# Patient Record
Sex: Female | Born: 1960
Health system: Southern US, Community
[De-identification: ages and names within clinical notes are randomized; demographics above are authoritative.]

## PROBLEM LIST (undated history)

## (undated) DIAGNOSIS — R739 Hyperglycemia, unspecified: Secondary | ICD-10-CM

## (undated) DIAGNOSIS — I10 Essential (primary) hypertension: Secondary | ICD-10-CM

## (undated) DIAGNOSIS — E559 Vitamin D deficiency, unspecified: Secondary | ICD-10-CM

## (undated) DIAGNOSIS — D219 Benign neoplasm of connective and other soft tissue, unspecified: Secondary | ICD-10-CM

## (undated) DIAGNOSIS — F319 Bipolar disorder, unspecified: Secondary | ICD-10-CM

## (undated) DIAGNOSIS — F419 Anxiety disorder, unspecified: Secondary | ICD-10-CM

## (undated) DIAGNOSIS — M5430 Sciatica, unspecified side: Secondary | ICD-10-CM

## (undated) DIAGNOSIS — M722 Plantar fascial fibromatosis: Secondary | ICD-10-CM

## (undated) DIAGNOSIS — E538 Deficiency of other specified B group vitamins: Secondary | ICD-10-CM

## (undated) HISTORY — DX: Hyperglycemia, unspecified: R73.9

## (undated) HISTORY — DX: Benign neoplasm of connective and other soft tissue, unspecified: D21.9

## (undated) HISTORY — DX: Essential (primary) hypertension: I10

## (undated) HISTORY — DX: Anxiety disorder, unspecified: F41.9

## (undated) HISTORY — DX: Plantar fascial fibromatosis: M72.2

## (undated) HISTORY — DX: Vitamin D deficiency, unspecified: E55.9

## (undated) HISTORY — DX: Deficiency of other specified B group vitamins: E53.8

## (undated) HISTORY — DX: Sciatica, unspecified side: M54.30

## (undated) HISTORY — PX: REDUCTION MAMMAPLASTY: SUR839

## (undated) HISTORY — DX: Bipolar disorder, unspecified: F31.9

---
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
## 1984-11-06 HISTORY — PX: SP CHOLECYSTOMY: HXRAD409

## 1996-11-06 HISTORY — PX: CYSTECTOMY: SHX5119

## 2001-11-06 HISTORY — PX: GASTRIC BYPASS: SHX52

## 2008-11-06 DIAGNOSIS — F319 Bipolar disorder, unspecified: Secondary | ICD-10-CM

## 2008-11-06 HISTORY — DX: Bipolar disorder, unspecified: F31.9

## 2010-09-22 LAB — HM PAP SMEAR: HM Pap smear: NORMAL

## 2010-09-22 LAB — HM MAMMOGRAPHY: HM Mammogram: NORMAL

## 2011-05-23 ENCOUNTER — Encounter: Payer: Self-pay | Admitting: Obstetrics & Gynecology

## 2011-05-23 ENCOUNTER — Ambulatory Visit (INDEPENDENT_AMBULATORY_CARE_PROVIDER_SITE_OTHER): Payer: Medicare Other | Admitting: Obstetrics & Gynecology

## 2011-05-23 ENCOUNTER — Other Ambulatory Visit: Payer: Self-pay | Admitting: Obstetrics & Gynecology

## 2011-05-23 VITALS — BP 104/70 | HR 68 | Temp 97.3°F | Resp 16 | Ht 64.0 in | Wt 248.0 lb

## 2011-05-23 DIAGNOSIS — A499 Bacterial infection, unspecified: Secondary | ICD-10-CM

## 2011-05-23 DIAGNOSIS — N760 Acute vaginitis: Secondary | ICD-10-CM

## 2011-05-23 DIAGNOSIS — D219 Benign neoplasm of connective and other soft tissue, unspecified: Secondary | ICD-10-CM | POA: Insufficient documentation

## 2011-05-23 DIAGNOSIS — N898 Other specified noninflammatory disorders of vagina: Secondary | ICD-10-CM

## 2011-05-23 DIAGNOSIS — D259 Leiomyoma of uterus, unspecified: Secondary | ICD-10-CM

## 2011-05-23 DIAGNOSIS — B9689 Other specified bacterial agents as the cause of diseases classified elsewhere: Secondary | ICD-10-CM

## 2011-05-23 DIAGNOSIS — E669 Obesity, unspecified: Secondary | ICD-10-CM

## 2011-05-23 DIAGNOSIS — F319 Bipolar disorder, unspecified: Secondary | ICD-10-CM

## 2011-05-23 DIAGNOSIS — I10 Essential (primary) hypertension: Secondary | ICD-10-CM

## 2011-05-23 NOTE — Progress Notes (Signed)
  Subjective:    Patient ID: Julie Powell, female    DOB: September 16, 1961, 50 y.o.   MRN: [id number]  HPI Patient is a 50 year old female, who presents for evaluation of fibroids. The patient has known she's had fibroids for approximately 20 years. They've been monitored by ultrasound in [place]. She does not have pelvic pain. She has occasional bladder pressure. Her periods are heavy, but she does not think they are extraordinarily heavy. The patient is a Scientist, product/process development. She was anemic in the past, but was told she was not anemic at her last visit with her primary care provider. The patient wanted to know if she needs to do anything with her fibroid tumors. Overall, my sense is that they do not affect her quality of life at this time.   Review of Systems  Constitutional: Negative.   Cardiovascular: Negative.   Gastrointestinal: Negative.   Genitourinary: Negative for difficulty urinating.       Some urinary pressure at times  Psychiatric/Behavioral: Negative.        Objective:   Physical Exam  Constitutional: She is oriented to person, place, and time. She appears well-developed and well-nourished.  HENT:  Head: Normocephalic.  Eyes: Conjunctivae are normal.  Pulmonary/Chest: Effort normal.  Abdominal: Soft. She exhibits mass. She exhibits no distension. There is no tenderness. There is no rebound and no guarding.       Fibroids can be felt in lower abdomen  Genitourinary: Vagina normal. Uterus is enlarged. Uterus is not tender.  Musculoskeletal: Normal range of motion.  Neurological: She is alert and oriented to person, place, and time.  Skin: Skin is warm and dry.  Psychiatric: She has a normal mood and affect.          Assessment & Plan:  Assessment: Patient is a 50 year old female with enlarged fibroid uterus. Uterus is approximately 14-16 weeks size. The fibroids can be felt posteriorly and laterally on bimanual exam. Patient has no real complaints of the fibroid.  She  wants to confirm that there is no more needed intervention.  1. Obtain record releases from last transvaginal ultrasound and last CBC/anemia workup.  2.  Obtain new transvaginal ultrasound to compare, if there's been growth in the fibroids.  3.  Patient is up-to-date on Pap smear and mammogram. These are done by her primary care provider.

## 2011-05-23 NOTE — Patient Instructions (Signed)
Will call you if wet prep is abnml.  Sign record releases today.

## 2011-05-24 LAB — WET PREP, GENITAL
Trich, Wet Prep: NONE SEEN
Yeast Wet Prep HPF POC: NONE SEEN

## 2011-05-25 ENCOUNTER — Telehealth: Payer: Self-pay | Admitting: *Deleted

## 2011-05-25 ENCOUNTER — Ambulatory Visit
Admission: RE | Admit: 2011-05-25 | Discharge: 2011-05-25 | Disposition: A | Discharge status: Home / Self Care | Payer: Medicare Other | Source: Ambulatory Visit | Attending: Obstetrics & Gynecology | Admitting: Obstetrics & Gynecology

## 2011-05-25 ENCOUNTER — Other Ambulatory Visit: Payer: Self-pay | Admitting: Obstetrics & Gynecology

## 2011-05-25 ENCOUNTER — Other Ambulatory Visit: Payer: Medicare Other

## 2011-05-25 DIAGNOSIS — R1909 Other intra-abdominal and pelvic swelling, mass and lump: Secondary | ICD-10-CM

## 2011-05-25 DIAGNOSIS — R978 Other abnormal tumor markers: Secondary | ICD-10-CM

## 2011-05-25 DIAGNOSIS — N898 Other specified noninflammatory disorders of vagina: Secondary | ICD-10-CM

## 2011-05-25 MED ORDER — METRONIDAZOLE 250 MG PO TABS: 500.0000 mg | ORAL_TABLET | Freq: Two times a day (BID) | ORAL | Status: AC

## 2011-05-25 NOTE — Telephone Encounter (Signed)
Message copied by Granville Lewis on Thu May 25, 2011 11:40 AM ------      Message from: Lesly Dukes      Created: Thu May 25, 2011  9:12 AM       Pt has had symptoms of vaginal discharge.  I ordered flagyl e script.  Please call her.

## 2011-05-25 NOTE — Telephone Encounter (Signed)
Pt notified of CT results.  Pt is scheduled for MRI of pelvis to further evaluate the RT adnexa.  Ca-125 drawn today, per Dr.Leggett.

## 2011-05-25 NOTE — Progress Notes (Signed)
Addended by: Lesly Dukes on: 05/25/2011 09:15 AM   Modules accepted: Orders

## 2011-05-25 NOTE — Telephone Encounter (Signed)
CA-125 drawn

## 2011-05-26 ENCOUNTER — Other Ambulatory Visit: Payer: Self-pay | Admitting: Obstetrics & Gynecology

## 2011-05-26 DIAGNOSIS — D259 Leiomyoma of uterus, unspecified: Secondary | ICD-10-CM

## 2011-05-26 LAB — CREATININE, SERUM: Creat: 1.04 mg/dL (ref 0.50–1.10)

## 2011-05-26 LAB — BUN: BUN: 19 mg/dL (ref 6–23)

## 2011-05-27 LAB — CA 125: CA 125: 10.1 U/mL (ref 0.0–30.2)

## 2011-05-28 ENCOUNTER — Ambulatory Visit
Admission: RE | Admit: 2011-05-28 | Discharge: 2011-05-28 | Disposition: A | Discharge status: Home / Self Care | Payer: Medicare Other | Source: Ambulatory Visit | Attending: Obstetrics & Gynecology | Admitting: Obstetrics & Gynecology

## 2011-05-28 DIAGNOSIS — D259 Leiomyoma of uterus, unspecified: Secondary | ICD-10-CM

## 2011-05-28 MED ORDER — GADOBENATE DIMEGLUMINE 529 MG/ML IV SOLN
20.0000 mL | Freq: Once | INTRAVENOUS | Status: AC | PRN
  Administered 2011-05-28: 20 mL via INTRAVENOUS

## 2011-06-14 ENCOUNTER — Ambulatory Visit: Payer: Medicare Other | Admitting: Obstetrics & Gynecology

## 2011-06-20 ENCOUNTER — Encounter: Payer: Self-pay | Admitting: Obstetrics & Gynecology

## 2011-06-20 ENCOUNTER — Ambulatory Visit (INDEPENDENT_AMBULATORY_CARE_PROVIDER_SITE_OTHER): Payer: Medicare Other | Admitting: Obstetrics & Gynecology

## 2011-06-20 VITALS — BP 109/72 | HR 94 | Temp 98.0°F | Resp 16 | Ht 64.0 in | Wt 250.0 lb

## 2011-06-20 DIAGNOSIS — D259 Leiomyoma of uterus, unspecified: Secondary | ICD-10-CM

## 2011-06-20 NOTE — Progress Notes (Signed)
  Subjective:    Patient ID: Julie Powell, female    DOB: 02/01/1961, 50 y.o.   MRN: [id number]  HPI Patient is a 50 year old female, who is here for followup. The patient is known to have a large fibroid uterus.  Patient had ultrasound and MRI, which confirmed multiple fibroids throughout the uterus. The largest fibroid is approximately 7 cm. The ultrasound there was a questionable right adnexal mass, but the ovaries looked normal. on MRI.  The patient's CA 125 was normal. The patient has normal menses. She occasionally skips a menses. She is having hot flashes and flushes so I believe she is somewhat nearing menopause. She was anemic in the past but this has resolved.  Patient would like to not have surgery to possible we reviewed the remote chance to be a leiomyosarcoma, however, the patient has had large fibroids for many years. The patient is a Scientist, product/process development. If surgery was desired we would give the patient 6 month of Lupron and erythropoietin 3 weeks before the surgery.     Review of Systems  Constitutional: Positive for activity change and fatigue.  Cardiovascular: Negative.   Gastrointestinal: Negative.   Genitourinary: Negative.   Psychiatric/Behavioral: Positive for dysphoric mood.       Objective:   Physical Exam  Constitutional: She is oriented to person, place, and time. She appears well-developed and well-nourished. No distress.  HENT:  Head: Normocephalic and atraumatic.  Eyes: Conjunctivae are normal.  Pulmonary/Chest: Effort normal.  Neurological: She is alert and oriented to person, place, and time.  Skin: Skin is warm and dry.  Psychiatric:       Appears more quieter and down.  Pt denies suicidal ideation or desire to hurt others.          Assessment & Plan:  Assessment 50 year old female with multiple fibroids that are not causing any symptoms. 1. Repeat ultrasound in one year. Discussed signs and symptoms of enlarging uterus.  Pt should see [place] if these  occur. 2.  Patient gets healthcare maintenance her primary care provider in needs to continue with this. 3. Patient needs a provider to treat her depression. The patient is not suicidal. Patient become suicidal or half of the death and she should go to the emergency room immediately.

## 2011-06-20 NOTE — Patient Instructions (Signed)
Fibroids (Leiomyoma's)   You have been diagnosed as having a fibroid. Fibroids are smooth muscle lumps (tumors) which can occur any place in a woman's body. They are usually in the womb (uterus). The most common problem (symptom) of fibroids is bleeding. Over time this may cause low red blood cells (anemia). Other symptoms include feelings of pressure and pain in the pelvis. The diagnosis (learning what is wrong) of fibroids is made by physical exam. Sometimes tests such as an ultrasound are used. This is helpful when fibroids are felt around the ovaries and to look for tumors.   TREATMENT   Most fibroids do not need surgical or medical treatment. Sometimes a tissue sample (biopsy) of the lining of the uterus is done to rule out cancer. If there is no cancer and only a small amount of bleeding, the problem can be watched.   Hormonal treatment can improve the problem.   When surgery is needed, it can consist of removing the fibroid. Vaginal birth may not be possible after the removal of fibroids. This depends on where they are and the extent of surgery. When pregnancy occurs with fibroids it is usually normal.   Your caregiver can help decide which treatments are best for you.   HOME CARE INSTRUCTIONS   Do not use aspirin as this may increase bleeding problems.   If your periods (menses) are heavy, record the number of pads or tampons used per month. Bring this information to your caregiver. This can help them determine the best treatment for you.   SEEK IMMEDIATE MEDICAL ATTENTION IF:   You have pelvic pain or cramps not controlled with medications, or experience a sudden increase in pain.   You have an increase of pelvic bleeding between and during menses.   You feel lightheaded or have fainting spells.   You develop worsening belly (abdominal) pain.   Document Released: 10/20/2000 Document Re-Released: 01/30/2008   ExitCare® Patient Information ©2011 [employer].

## 2011-07-04 ENCOUNTER — Ambulatory Visit (INDEPENDENT_AMBULATORY_CARE_PROVIDER_SITE_OTHER): Payer: Medicare Other | Admitting: Licensed Clinical Social Worker

## 2011-07-04 DIAGNOSIS — F331 Major depressive disorder, recurrent, moderate: Secondary | ICD-10-CM

## 2011-07-14 ENCOUNTER — Ambulatory Visit (INDEPENDENT_AMBULATORY_CARE_PROVIDER_SITE_OTHER): Payer: Medicare Other | Admitting: Physician Assistant

## 2011-07-14 DIAGNOSIS — F339 Major depressive disorder, recurrent, unspecified: Secondary | ICD-10-CM

## 2011-07-19 ENCOUNTER — Encounter (INDEPENDENT_AMBULATORY_CARE_PROVIDER_SITE_OTHER): Payer: Medicare Other | Admitting: Licensed Clinical Social Worker

## 2011-07-19 DIAGNOSIS — F331 Major depressive disorder, recurrent, moderate: Secondary | ICD-10-CM

## 2011-07-24 ENCOUNTER — Encounter (HOSPITAL_COMMUNITY): Payer: Medicare Other | Admitting: Physician Assistant

## 2011-07-24 ENCOUNTER — Encounter (INDEPENDENT_AMBULATORY_CARE_PROVIDER_SITE_OTHER): Payer: Medicare Other | Admitting: Psychiatry

## 2011-07-24 DIAGNOSIS — F3132 Bipolar disorder, current episode depressed, moderate: Secondary | ICD-10-CM

## 2011-07-24 DIAGNOSIS — F4010 Social phobia, unspecified: Secondary | ICD-10-CM

## 2011-07-27 ENCOUNTER — Encounter (INDEPENDENT_AMBULATORY_CARE_PROVIDER_SITE_OTHER): Payer: Medicare Other | Admitting: Licensed Clinical Social Worker

## 2011-07-27 DIAGNOSIS — F329 Major depressive disorder, single episode, unspecified: Secondary | ICD-10-CM

## 2011-08-09 ENCOUNTER — Encounter (INDEPENDENT_AMBULATORY_CARE_PROVIDER_SITE_OTHER): Payer: BC Managed Care – PPO | Admitting: Licensed Clinical Social Worker

## 2011-08-09 DIAGNOSIS — F331 Major depressive disorder, recurrent, moderate: Secondary | ICD-10-CM

## 2011-08-18 ENCOUNTER — Encounter (INDEPENDENT_AMBULATORY_CARE_PROVIDER_SITE_OTHER): Payer: Medicare Other | Admitting: Licensed Clinical Social Worker

## 2011-08-18 DIAGNOSIS — F331 Major depressive disorder, recurrent, moderate: Secondary | ICD-10-CM

## 2011-09-06 ENCOUNTER — Encounter (INDEPENDENT_AMBULATORY_CARE_PROVIDER_SITE_OTHER): Payer: BC Managed Care – PPO | Admitting: Licensed Clinical Social Worker

## 2011-09-06 DIAGNOSIS — F331 Major depressive disorder, recurrent, moderate: Secondary | ICD-10-CM

## 2011-09-08 ENCOUNTER — Encounter (HOSPITAL_COMMUNITY): Payer: Self-pay | Admitting: Psychiatry

## 2011-09-13 ENCOUNTER — Encounter (HOSPITAL_COMMUNITY): Payer: Self-pay | Admitting: Psychology

## 2011-09-22 ENCOUNTER — Encounter (HOSPITAL_COMMUNITY): Payer: Medicare Other | Admitting: Psychiatry

## 2011-09-26 ENCOUNTER — Encounter (HOSPITAL_COMMUNITY): Payer: Medicare Other | Admitting: Licensed Clinical Social Worker

## 2011-10-09 ENCOUNTER — Encounter: Payer: Self-pay | Admitting: Obstetrics & Gynecology

## 2011-10-11 ENCOUNTER — Encounter (HOSPITAL_COMMUNITY): Payer: Self-pay | Admitting: Psychiatry

## 2011-10-11 ENCOUNTER — Ambulatory Visit (INDEPENDENT_AMBULATORY_CARE_PROVIDER_SITE_OTHER): Payer: BC Managed Care – PPO | Admitting: Psychiatry

## 2011-10-11 DIAGNOSIS — F419 Anxiety disorder, unspecified: Secondary | ICD-10-CM

## 2011-10-11 DIAGNOSIS — F332 Major depressive disorder, recurrent severe without psychotic features: Secondary | ICD-10-CM

## 2011-10-11 DIAGNOSIS — F411 Generalized anxiety disorder: Secondary | ICD-10-CM

## 2011-10-11 MED ORDER — LITHIUM CARBONATE ER 300 MG PO TBCR: EXTENDED_RELEASE_TABLET | ORAL | Status: DC

## 2011-10-11 MED ORDER — CLONAZEPAM 0.5 MG PO TABS: 0.5000 mg | ORAL_TABLET | Freq: Two times a day (BID) | ORAL | Status: DC

## 2011-10-11 MED ORDER — PAROXETINE HCL 30 MG PO TABS: 30.0000 mg | ORAL_TABLET | ORAL | Status: DC

## 2011-10-11 MED ORDER — BUPROPION HCL ER (SR) 100 MG PO TB12: ORAL_TABLET | ORAL | Status: DC

## 2011-10-11 NOTE — Patient Instructions (Signed)
Take medications as provided by hospital and written today and use balm for dry lips.  Next visit Lithium level will be taken.  Recall  It is important to report any suicidal thoughts to call office  or call the Crisis numbers you have been given. Keep appt with JB  Return in 1 month

## 2011-10-11 NOTE — Progress Notes (Signed)
   [facility] Follow-up Outpatient Visit  Antia Rahal [birth year]  Date: 10/11/11   Subjective: PT BECAME VERY DEPRESSED WITH SUICIDAL IDEATION.  SHE WAS ADM[facility] 07, 2011 and was discharged Nov.7, 2012 and IOP nov. 8 2012 group counslling.  todaThere were no vitals filed for this visit.  Mental Status Examination  Appearance: casual   Alert: Yes Attention: good  Cooperative: Yes Eye Contact: Good Speech: normal Psychomotor Activity: Normal Memory/Concentration: fair Oriented: person, place, time/date, situation and stated date of December 5/12 Mood: Worthlessnot doing things she could should do Affect: Congruent Thought Processes and Associations: Futures trader of Knowledge: Fair Thought Content: Normal thought process Insight: Fair Judgement: Fair  Diagnosis: Bipolar I depression most recent in early partial remission  Treatment Plan: mediocation management, therpy  Mickeal Skinner, MD

## 2011-10-18 ENCOUNTER — Ambulatory Visit (HOSPITAL_COMMUNITY): Payer: BC Managed Care – PPO | Admitting: Licensed Clinical Social Worker

## 2011-11-07 DIAGNOSIS — M722 Plantar fascial fibromatosis: Secondary | ICD-10-CM

## 2011-11-07 HISTORY — DX: Plantar fascial fibromatosis: M72.2

## 2011-11-13 ENCOUNTER — Encounter (HOSPITAL_COMMUNITY): Payer: Self-pay | Admitting: Psychiatry

## 2011-11-13 ENCOUNTER — Ambulatory Visit (INDEPENDENT_AMBULATORY_CARE_PROVIDER_SITE_OTHER): Payer: Medicare Other | Admitting: Psychiatry

## 2011-11-13 VITALS — BP 110/84 | HR 81 | Ht 64.0 in | Wt 258.0 lb

## 2011-11-13 DIAGNOSIS — E119 Type 2 diabetes mellitus without complications: Secondary | ICD-10-CM

## 2011-11-13 DIAGNOSIS — F332 Major depressive disorder, recurrent severe without psychotic features: Secondary | ICD-10-CM | POA: Diagnosis not present

## 2011-11-13 DIAGNOSIS — F411 Generalized anxiety disorder: Secondary | ICD-10-CM | POA: Diagnosis not present

## 2011-11-13 DIAGNOSIS — F419 Anxiety disorder, unspecified: Secondary | ICD-10-CM

## 2011-11-13 MED ORDER — LITHIUM CARBONATE ER 300 MG PO TBCR: EXTENDED_RELEASE_TABLET | ORAL | Status: DC

## 2011-11-13 MED ORDER — BUPROPION HCL ER (SR) 100 MG PO TB12: ORAL_TABLET | ORAL | Status: DC

## 2011-11-13 MED ORDER — PAROXETINE HCL 30 MG PO TABS: 30.0000 mg | ORAL_TABLET | ORAL | Status: DC

## 2011-11-13 MED ORDER — CLONAZEPAM 0.5 MG PO TABS: 0.5000 mg | ORAL_TABLET | Freq: Two times a day (BID) | ORAL | Status: DC

## 2011-11-13 NOTE — Patient Instructions (Signed)
He has been given your medications would refill and remember you are to prepare for a fasting blood test any morning next week through Friday if you miss "next week you will I have your lab tests canceled. Continue with your therapy with Merlene Morse and a return in one month to if it to review your lab test.  Remember if you have any suicidal thoughts or extreme stress you may call 911 or at the behavioral [facility] in [place] [phone].

## 2011-11-13 NOTE — Progress Notes (Signed)
Patient ID: Julie Powell, female   DOB: 06/03/1961, 51 y.o.   MRN: [id number] Julie Powell has been having some attachment stress. She is trying to give her 16 year old son and some freedom and some connection with father and stepmother. However, she loaned him her car and gave him a period of time to visit family and he decided not to return on the expected date. She is very distressed that he has disrespected her and he stepmother has not insisted that he follow mother's instructions. Stepmother left a message and said that he will be returning today. She feels very isolated and has decided she has to create a family of her own. She is hoping that her son will complete high school graduate and attended college as planned. She is uncertain of his motivation.  She states that she has had bariatric surgery in the past and had lost a significant amount of weight. She weighed over 300 pounds and was able to reduce her weight to 180. She says she does little activity at all and stays home, gets up dresses and eats all day long. When asked why she does this she admits it is comfort food. She really would like to lose weight at this time and she is encouraged to consider this. There are programs for her to explore and she needs to have a structured plan so she can stop eating constantly. It affects her self-esteem and she knows that she felt so much better when she wasn't so obviously overweight. She denies suicidal plans but has had passive suicidal thoughts in the past. She is reminded to always report these moods and seek help. She continues with personal therapy with Merlene Morse.

## 2011-11-16 ENCOUNTER — Ambulatory Visit (HOSPITAL_COMMUNITY): Payer: Self-pay | Admitting: Licensed Clinical Social Worker

## 2011-11-17 ENCOUNTER — Encounter (HOSPITAL_COMMUNITY): Payer: Self-pay | Admitting: Licensed Clinical Social Worker

## 2011-11-17 ENCOUNTER — Ambulatory Visit (INDEPENDENT_AMBULATORY_CARE_PROVIDER_SITE_OTHER): Payer: BC Managed Care – PPO | Admitting: Licensed Clinical Social Worker

## 2011-11-17 DIAGNOSIS — F319 Bipolar disorder, unspecified: Secondary | ICD-10-CM

## 2011-11-17 NOTE — Progress Notes (Signed)
   THERAPIST PROGRESS NOTE  Session Time: 9:00 - 9:55 AM  Participation Level: Active  Behavioral Response: Well GroomedAlertEuthymic and Irritable  Type of Therapy: Individual Therapy  Treatment Goals addressed: Anger and Coping  Interventions: Strength-based, Family Systems and Reframing  Summary: Julie Powell is a 51 y.o. female who presents with a pleasant mood which changed to irritation when she talked about certain issues.  She was upset with her son who went to visit his step family over Christmas and he extended his stay for over another week without permission and he had her car - she was renting a car.  He came back saying he loved them as much as her which felt like a narcissistic injury to her.  Discussed not taking his comments like that so seriously for he had a need to distance with her at his age and that may be the way he is doing it.  The incident did make her aware that she has put him first and now she must put herself first.  She has been using [email or web address] which has not worked out so far.  In fact she wants to consider relationships with women because she is fed up with men.  That may or may not work for her.  She knows a lot of her problems stem from her weight issue - it is a way of hiding and she does not feel good about herself at the weight she is at.  She is resistant to working on her weight issue - food is definitely an emotional fix.  There is a friend that is in [place] and she has meant to go visit her for a long time - it is time to plan a trip.  She is not sure what goals to work on in therapy.  Her assignment for next week is to define some issues that she would be willing to work on   Suicidal/Homicidal: Nowithout intent/plan  Therapist Response: She commented about how good it was to want to live  Plan: Return again in 1 weeks.  Diagnosis: Axis I: Bipolar, Depressed    Axis II: Deferred    Julie Powell,JUDITH A, LCSW 11/17/2011

## 2011-11-17 NOTE — Patient Instructions (Signed)
Work on [employer] Figure out problem want to deal with counseling. Not personalize son's reaction Continue to set appropriate limits for son Make plans to go to [facility]

## 2011-11-20 ENCOUNTER — Other Ambulatory Visit (HOSPITAL_COMMUNITY): Payer: Self-pay | Admitting: Psychiatry

## 2011-11-20 DIAGNOSIS — F332 Major depressive disorder, recurrent severe without psychotic features: Secondary | ICD-10-CM | POA: Diagnosis not present

## 2011-11-20 DIAGNOSIS — E119 Type 2 diabetes mellitus without complications: Secondary | ICD-10-CM | POA: Diagnosis not present

## 2011-11-21 LAB — CBC WITH DIFFERENTIAL/PLATELET
Basophils Absolute: 0 10*3/uL (ref 0.0–0.1)
Basophils Relative: 1 % (ref 0–1)
Eosinophils Absolute: 0.2 10*3/uL (ref 0.0–0.7)
Eosinophils Relative: 4 % (ref 0–5)
HCT: 39.5 % (ref 36.0–46.0)
Hemoglobin: 12.7 g/dL (ref 12.0–15.0)
Lymphocytes Relative: 31 % (ref 12–46)
Lymphs Abs: 1.3 10*3/uL (ref 0.7–4.0)
MCH: 30.5 pg (ref 26.0–34.0)
MCHC: 32.2 g/dL (ref 30.0–36.0)
MCV: 95 fL (ref 78.0–100.0)
Monocytes Absolute: 0.3 10*3/uL (ref 0.1–1.0)
Monocytes Relative: 7 % (ref 3–12)
Neutro Abs: 2.4 10*3/uL (ref 1.7–7.7)
Neutrophils Relative %: 57 % (ref 43–77)
Platelets: 242 10*3/uL (ref 150–400)
RBC: 4.16 MIL/uL (ref 3.87–5.11)
RDW: 14 % (ref 11.5–15.5)
WBC: 4.1 10*3/uL (ref 4.0–10.5)

## 2011-11-21 LAB — COMPLETE METABOLIC PANEL WITH GFR
ALT: 20 U/L (ref 0–35)
AST: 21 U/L (ref 0–37)
Albumin: 4.3 g/dL (ref 3.5–5.2)
Alkaline Phosphatase: 89 U/L (ref 39–117)
BUN: 19 mg/dL (ref 6–23)
CO2: 28 mEq/L (ref 19–32)
Calcium: 9.4 mg/dL (ref 8.4–10.5)
Chloride: 104 mEq/L (ref 96–112)
Creat: 1 mg/dL (ref 0.50–1.10)
GFR, Est African American: 77 mL/min
GFR, Est Non African American: 67 mL/min
Glucose, Bld: 89 mg/dL (ref 70–99)
Potassium: 4.7 mEq/L (ref 3.5–5.3)
Sodium: 140 mEq/L (ref 135–145)
Total Bilirubin: 0.4 mg/dL (ref 0.3–1.2)
Total Protein: 6.8 g/dL (ref 6.0–8.3)

## 2011-11-21 LAB — LIPID PANEL
Cholesterol: 209 mg/dL — ABNORMAL HIGH (ref 0–200)
HDL: 90 mg/dL (ref >39)
LDL Cholesterol: 110 mg/dL — ABNORMAL HIGH (ref 0–99)
Total CHOL/HDL Ratio: 2.3 Ratio
Triglycerides: 47 mg/dL (ref <150)
VLDL: 9 mg/dL (ref 0–40)

## 2011-11-21 LAB — HEMOGLOBIN A1C
Hgb A1c MFr Bld: 5.7 % — ABNORMAL HIGH (ref <5.7)
Mean Plasma Glucose: 117 mg/dL — ABNORMAL HIGH (ref <117)

## 2011-11-21 LAB — LITHIUM LEVEL: Lithium Lvl: 0.56 mEq/L — ABNORMAL LOW (ref 0.80–1.40)

## 2011-11-24 ENCOUNTER — Ambulatory Visit (INDEPENDENT_AMBULATORY_CARE_PROVIDER_SITE_OTHER): Payer: Medicare Other | Admitting: Licensed Clinical Social Worker

## 2011-11-24 ENCOUNTER — Encounter (HOSPITAL_COMMUNITY): Payer: Self-pay | Admitting: Licensed Clinical Social Worker

## 2011-11-24 DIAGNOSIS — F3132 Bipolar disorder, current episode depressed, moderate: Secondary | ICD-10-CM

## 2011-11-24 NOTE — Patient Instructions (Signed)
Julie Powell will make a list of each family member and how they have hurt her and left her feeling unloved Plan to explore in depth and help her see there may be another way to look at what has happened in the past Assist her in giving up old perceptions and start building new ones

## 2011-11-24 NOTE — Progress Notes (Signed)
   THERAPIST PROGRESS NOTE  Session Time: 12:00 - !2:50 AM  Participation Level: Active  Behavioral Response: Well GroomedAlertDepressed  Type of Therapy: Individual Therapy  Treatment Goals addressed: Anger and Coping  Interventions: Strength-based, Supportive and Family Systems  Summary: Julie Powell is a 51 y.o. female who presents with a bit of a defeatist attitude when we discuss her weight or meeting people.  She does not have any interest in losing weight even though she knows she needs to.  She talked more about difficulty being around people and her fear of getting hurt or abandoned.  She said she does not feel like a victim for she gets angry about it and then puts her wall up.  Discussed more about her son and how she interprets his behavior.  She sees it that he does not love her when he did not come home when she asked.  Discussed teenagers and how they think - it had nothing to do with her it was his satisfying his own pleasures.  If she continued to think he did not love her it might have ended their relationship.  Discussed the difficulties of letting go when they are ready for next step in life.  After our discussion it seems like she needs to do some deep work with her past hurts in her family - so she is to bring a list of people and how they hurt her.  We will discuss with the goal of reframing, forgiveness, and moving on.  She admitted that she mainly wanted a female companion and she has tried different ways and nothing has been successful.  Discussed different work scenarios and she is clear that she cannot go back SW - her evaluations were always good but she felt she did not like the work and she could not give it all that it needed.  Suicidal/Homicidal: Nowithout intent/plan  Therapist Response: Still has some ideation but has no plan and the thoughts are fleeting.  Reviewed safety again  Plan: Return again in 1 weeks.  Diagnosis: Axis I: Bipolar, Depressed    Axis  II: Deferred    Maleah Rabago,JUDITH A, LCSW 11/24/2011

## 2011-11-29 ENCOUNTER — Ambulatory Visit (INDEPENDENT_AMBULATORY_CARE_PROVIDER_SITE_OTHER): Payer: Medicare Other | Admitting: Licensed Clinical Social Worker

## 2011-11-29 DIAGNOSIS — F3130 Bipolar disorder, current episode depressed, mild or moderate severity, unspecified: Secondary | ICD-10-CM | POA: Diagnosis not present

## 2011-11-30 ENCOUNTER — Encounter (HOSPITAL_COMMUNITY): Payer: Self-pay | Admitting: Licensed Clinical Social Worker

## 2011-11-30 NOTE — Progress Notes (Signed)
   THERAPIST PROGRESS NOTE  Session Time: 10:05 - 1100 AM  Participation Level: Active  Behavioral Response: Well GroomedAlertDepressed  Type of Therapy: Individual Therapy  Treatment Goals addressed: Coping  Interventions: Solution Focused and Supportive  Summary: Julie Powell is a 51 y.o. female who presents with bipolar disorder,  Today she was euthymic, depressed and somewhat hopeless.  She is aware that her weight is a problem that gets in her way for finding companionship.  Most men are not interested in a heavy woman.  She reports that her day consists of sleeping, tv watching and eating.  Her TV shows stop her from making plans because she does not want to miss some show.  She likes the [employer] a lot.  She knows that she allows that to control her life.  She lives through these TV lives.  She is not uncomfortable about going to a movie or eating out by herself.  Her son is doing what he is supposed to do to get ready for college - he is going into Geologist, engineering school.  Discussed how she did not need to take her son's behavior personally - like when he stayed with his step family instead of coming home.  For one thing kids at his age are self centered and are focussed on their own good time and not concerned about what you want. [name] had started pulling away because she was personalizing his behavior.  After our discussion, she felt better about her son.  She listed things that she could do.  Take her laptop to [place], go to a movie, dinner theatre, travel to [place], [place], [place], theatre play, and clubs for the social scene.  She will work on her list and perhaps at least do one thing this week..   Suicidal/Homicidal: Nowithout intent/plan  Plan: Return again in 1 weeks.  Diagnosis: Axis I: Bipolar, Depressed    Axis II: Deferred    [name],JUDITH A, LCSW 11/30/2011

## 2011-11-30 NOTE — Patient Instructions (Signed)
Make a list of all places would like to go Look over [place] and see what would interest her for day trips

## 2011-12-08 ENCOUNTER — Encounter (HOSPITAL_COMMUNITY): Payer: Self-pay | Admitting: Licensed Clinical Social Worker

## 2011-12-08 ENCOUNTER — Ambulatory Visit (INDEPENDENT_AMBULATORY_CARE_PROVIDER_SITE_OTHER): Payer: Medicare Other | Admitting: Licensed Clinical Social Worker

## 2011-12-08 DIAGNOSIS — F319 Bipolar disorder, unspecified: Secondary | ICD-10-CM

## 2011-12-08 NOTE — Progress Notes (Signed)
   THERAPIST PROGRESS NOTE  Session Time: - 10:05 - 11:00  AM  Participation Level: Active  Behavioral Response: Well GroomedAlertEuthymic  Type of Therapy: Individual Therapy  Treatment Goals addressed: Anxiety and Coping  Interventions: Motivational Interviewing, Strength-based, Supportive and Reframing  Summary: Glynnis Gavel is a 51 y.o. female who presents with anxiety and depression.  Today Machele reports that she and her son went to [place] and did some touring - it was an enjoyable trip - it was something her son had been wanting to do.   .She also took a walk for exercise around the [place].  Discussed her problem with eating compulsively - she is eating too much food - eats until it hurts.  Discussed the purpose of the behavior and she agreed that it had to do with self soothing and pushing feelings down  Talked about eating more often the amount would be equivalent to one meal.  Set up menu for 3 meals and snacks - then spread out.  Try some substitute behaviors.  Watch a movie, write in a journal, call someone she can talk to, or take a walk. This will be difficult for her but she would give it a try.  Discussed how she does not have friends down here.  She goes to [employer] meetings.  She has not been going and she could not deny that the people are friendly and understanding - not judgmental - so she really could not use her usual excuses.  She has to learn that it is ok to take what she needs which would be going to a meeting and not to choose other activities that she does not feel up to right now.  She is going to make some contacts and she is continuing to look into finding a club where there is dancing for an older group of people.  Suicidal/Homicidal: Nowithout intent/plan  Plan: Return again in 1 weeks.  Diagnosis: Axis I: Bipolar, Depressed    Axis II: Deferred    Bradshaw Minihan,JUDITH A, LCSW 12/08/2011

## 2011-12-14 ENCOUNTER — Ambulatory Visit (INDEPENDENT_AMBULATORY_CARE_PROVIDER_SITE_OTHER): Payer: Medicare Other | Admitting: Psychiatry

## 2011-12-14 ENCOUNTER — Encounter (HOSPITAL_COMMUNITY): Payer: Self-pay | Admitting: Psychiatry

## 2011-12-14 VITALS — BP 110/72 | HR 82 | Ht 64.0 in | Wt 262.0 lb

## 2011-12-14 DIAGNOSIS — F3130 Bipolar disorder, current episode depressed, mild or moderate severity, unspecified: Secondary | ICD-10-CM | POA: Diagnosis not present

## 2011-12-14 DIAGNOSIS — F411 Generalized anxiety disorder: Secondary | ICD-10-CM | POA: Diagnosis not present

## 2011-12-14 DIAGNOSIS — F419 Anxiety disorder, unspecified: Secondary | ICD-10-CM

## 2011-12-14 DIAGNOSIS — F332 Major depressive disorder, recurrent severe without psychotic features: Secondary | ICD-10-CM

## 2011-12-14 MED ORDER — CLONAZEPAM 0.5 MG PO TABS: 0.5000 mg | ORAL_TABLET | Freq: Two times a day (BID) | ORAL | Status: DC

## 2011-12-14 MED ORDER — LITHIUM CARBONATE ER 300 MG PO TBCR: EXTENDED_RELEASE_TABLET | ORAL | Status: DC

## 2011-12-14 MED ORDER — PAROXETINE HCL 30 MG PO TABS: 30.0000 mg | ORAL_TABLET | ORAL | Status: DC

## 2011-12-14 NOTE — Patient Instructions (Signed)
You have been giving her medications for 3 months supply and the Clonopin may be called in by the pharmacist and if three-month supply can be provided all the pharmacists has to do with indicate that on the request for the next prescription refill. Remember if you are having any extreme distress suicidal/homicidal thoughts please call 911, go to the nearest emergency room or call behavioral health center [phone].

## 2011-12-14 NOTE — Progress Notes (Signed)
Patient ID: Julie Powell, female   DOB: 1961/08/08, 51 y.o.   MRN: [id number] [name] is states that she is disappointed that she has been eating gradually more and more a quantity that has defeated the purpose of having a bypass surgery. She wishes to have another one. She states that she is depressed with a mood of 7/10 worst. She doesn't feel motivated to do anything. Her therapist is encouraging her to choose one activity she will do outdoors. She denies any suicidal/homicidal thoughts. She believes that the medication has been helpful despite her low mood.

## 2011-12-19 ENCOUNTER — Ambulatory Visit (INDEPENDENT_AMBULATORY_CARE_PROVIDER_SITE_OTHER): Payer: Medicare Other | Admitting: Licensed Clinical Social Worker

## 2011-12-19 ENCOUNTER — Encounter (HOSPITAL_COMMUNITY): Payer: Self-pay | Admitting: Licensed Clinical Social Worker

## 2011-12-19 DIAGNOSIS — F319 Bipolar disorder, unspecified: Secondary | ICD-10-CM

## 2011-12-19 NOTE — Progress Notes (Signed)
   THERAPIST PROGRESS NOTE  Session Time: 10: 10 - 11:00  Participation Level: Active  Behavioral Response: Well GroomedAlertEuphoric  Type of Therapy: Individual Therapy  Treatment Goals addressed: Anxiety and Coping  Interventions: Motivational Interviewing, Strength-based, Supportive and Reframing  Summary: Julie Powell is a 51 y.o. female who presents with problems with isolation, weight and anxiety.  Today Julie Powell was in a pleasant mood - she reported that she went for a walk with her son at the mall - their purpose was for exercise.  Looked at things but did not shop.  She is still looking for house cleaning job - mainly at a hotel or assisted living place.  Anywhere she does not have to be exposed to too many people.  Discussed when she started to feel like not being around people - she identified when husband and she were working in the same agency and he had been having affaires and every one else know except her.  She felt like hiding - she took his behavior as shame to herself.  The step mother that her son has is one of those women. He did marry her and is now separated from her and living with someone else.  This woman had some sons and she had a child with his father so he likes to go up there and get together with his brothers.  Julie Powell seems to take that personally and feels she is forgotten when he goes there.  She stated that his father and she have maintained a good relationship throughout the separation and divorce.  She also reported that she does not feel like some of the medications she is on is either helping and they are adding to her problem with weight.  She has started to wean herself off of Paxil, Lithium and Klonopin.  She knows to do it slowly and give time in between .  However, I strongly emphasized that she needs to work with her doctor in this process.  She is staying on Wellbutrin.   She is starting with our new doctor who is starting in March because Dr. Ferol Luz is  leaving.  She is talking about wanting to lose weight - strongly recommended weight watchers program.  She said she could work our a plan to follow and we would discuss next time.  Am encouraging her to make this one of her goals for she is also hiding herself with her weight and eating away her feelings.  Suicidal/Homicidal: Nowithout intent/plan  Therapist Response: Seems to be somewhat more active in planning her life into future - no suicidal ideation  Plan: Return again in 2 weeks.  Diagnosis: Axis I: Bipolar, Depressed    Axis II: Deferred    Sherina Stammer,JUDITH A, LCSW 12/19/2011

## 2011-12-19 NOTE — Patient Instructions (Signed)
Be in touch with Dr. Ferol Luz if have problems with lowering medications Discussed medications thoroughly with new doctor Continue to do exercise away from house - at mall etc Set up weight loss program - look into weight watchers

## 2012-01-02 ENCOUNTER — Encounter (HOSPITAL_COMMUNITY): Payer: Self-pay | Admitting: Licensed Clinical Social Worker

## 2012-01-02 ENCOUNTER — Ambulatory Visit (INDEPENDENT_AMBULATORY_CARE_PROVIDER_SITE_OTHER): Payer: Medicare Other | Admitting: Licensed Clinical Social Worker

## 2012-01-02 DIAGNOSIS — F319 Bipolar disorder, unspecified: Secondary | ICD-10-CM

## 2012-01-02 NOTE — Patient Instructions (Signed)
Continue to review foods willing to change over to Revamp recipes on food she likes to make less fattening Start to do more cooking at home Do not cut all foods out that she likes even if fattening - just eat less Continue to add more choices of foods in meals instead of only eating a lot of one thing.
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
## 2012-01-04 ENCOUNTER — Encounter (HOSPITAL_COMMUNITY): Payer: Self-pay | Admitting: Licensed Clinical Social Worker

## 2012-01-16 ENCOUNTER — Ambulatory Visit (INDEPENDENT_AMBULATORY_CARE_PROVIDER_SITE_OTHER): Payer: Medicare Other | Admitting: Licensed Clinical Social Worker

## 2012-01-16 ENCOUNTER — Encounter (HOSPITAL_COMMUNITY): Payer: Self-pay | Admitting: Licensed Clinical Social Worker

## 2012-01-16 DIAGNOSIS — F319 Bipolar disorder, unspecified: Secondary | ICD-10-CM | POA: Diagnosis not present

## 2012-01-16 NOTE — Progress Notes (Signed)
   THERAPIST PROGRESS NOTE  Session Time: 10:00 - 10:55  Participation Level: Active  Behavioral Response: CasualAlertDepressed and Euthymic  Type of Therapy: Individual Therapy  Treatment Goals addressed: Anger and Coping  Interventions: Motivational Interviewing, Solution Focused and Supportive  Summary: Julie Powell is a 51 y.o. female who presents with depression.  Today she was reporting on problems dealing with her son.  She will be losing a lot of income when he reaches 70 and he does not seem to understand her limitations Discussed ways to help him mature.  [name] has to work on her own difficulty in being manipulated by him - he tends to give her guilt trips.  She is questioning his decision to attend a 6 month program on sound production - She cannot afford it and father will not help.  He needs to get a job and perhaps he could save up some money. There is no financial aid in the program.  She is interested in working on this issue. Also discussed her problems with weight.  She has gained back her weight since her bariatric surgery.  Discussed her resistance to losing weight  -  She clearly does not want to do what is required.  She has never gotten clear about what she wants for herself.  It feels like the messages around eating and weight come from the outside - family and society.  Our work is to work with her internal sense of self and what would feel right to herself.  It will be a job to get rid of those outside voices.  She does recognize this as a problem.  Suicidal/Homicidal: Nowithout intent/plan  Plan: Return again in 1 weeks.  Diagnosis: Axis I: Major depressive disorder mod rec    Axis II: Deferred    Deaysia Grigoryan,JUDITH A, LCSW 01/16/2012

## 2012-01-30 ENCOUNTER — Ambulatory Visit (HOSPITAL_COMMUNITY): Payer: Self-pay | Admitting: Licensed Clinical Social Worker

## 2012-02-07 ENCOUNTER — Ambulatory Visit (HOSPITAL_COMMUNITY): Payer: Self-pay | Admitting: Psychiatry

## 2012-02-08 ENCOUNTER — Ambulatory Visit (INDEPENDENT_AMBULATORY_CARE_PROVIDER_SITE_OTHER): Payer: Medicare Other | Admitting: Psychiatry

## 2012-02-08 ENCOUNTER — Encounter (HOSPITAL_COMMUNITY): Payer: Self-pay | Admitting: Psychiatry

## 2012-02-08 VITALS — BP 130/85 | HR 71 | Ht 65.25 in | Wt 264.0 lb

## 2012-02-08 DIAGNOSIS — F332 Major depressive disorder, recurrent severe without psychotic features: Secondary | ICD-10-CM

## 2012-02-08 DIAGNOSIS — F411 Generalized anxiety disorder: Secondary | ICD-10-CM | POA: Diagnosis not present

## 2012-02-08 DIAGNOSIS — F419 Anxiety disorder, unspecified: Secondary | ICD-10-CM

## 2012-02-08 MED ORDER — LITHIUM CARBONATE ER 300 MG PO TBCR: EXTENDED_RELEASE_TABLET | ORAL | Status: DC

## 2012-02-08 MED ORDER — BUPROPION HCL ER (SR) 100 MG PO TB12: ORAL_TABLET | ORAL | Status: DC

## 2012-02-08 MED ORDER — CLONAZEPAM 0.5 MG PO TABS: 0.5000 mg | ORAL_TABLET | Freq: Once | ORAL | Status: DC

## 2012-02-08 MED ORDER — PAROXETINE HCL 30 MG PO TABS: 15.0000 mg | ORAL_TABLET | ORAL | Status: DC

## 2012-02-08 MED ORDER — BUPROPION HCL 100 MG PO TABS: 100.0000 mg | ORAL_TABLET | Freq: Two times a day (BID) | ORAL | Status: DC
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
## 2012-02-12 ENCOUNTER — Encounter (HOSPITAL_COMMUNITY): Payer: Self-pay | Admitting: Psychiatry

## 2012-02-13 ENCOUNTER — Ambulatory Visit (INDEPENDENT_AMBULATORY_CARE_PROVIDER_SITE_OTHER): Payer: Medicare Other | Admitting: Licensed Clinical Social Worker

## 2012-02-13 DIAGNOSIS — F329 Major depressive disorder, single episode, unspecified: Secondary | ICD-10-CM | POA: Diagnosis not present
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
## 2012-02-17 LAB — DRUGS OF ABUSE SCREEN W/O ALC, ROUTINE URINE
Amphetamine Screen, Ur: NEGATIVE
Barbiturate Quant, Ur: NEGATIVE
Benzodiazepines.: NEGATIVE
Cocaine Metabolites: NEGATIVE
Creatinine,U: 136.9 mg/dL
Marijuana Metabolite: NEGATIVE
Methadone: NEGATIVE
Opiate Screen, Urine: NEGATIVE
Phencyclidine (PCP): NEGATIVE
Propoxyphene: NEGATIVE

## 2012-02-17 LAB — LITHIUM LEVEL: Lithium Lvl: 0.61 mEq/L — ABNORMAL LOW (ref 0.80–1.40)

## 2012-02-20 ENCOUNTER — Ambulatory Visit (INDEPENDENT_AMBULATORY_CARE_PROVIDER_SITE_OTHER): Payer: Medicare Other | Admitting: Licensed Clinical Social Worker

## 2012-02-20 DIAGNOSIS — F331 Major depressive disorder, recurrent, moderate: Secondary | ICD-10-CM

## 2012-02-20 NOTE — Progress Notes (Signed)
   THERAPIST PROGRESS NOTE  Session Time: 9:00 - 10:00  Participation Level: Active  Behavioral Response: Well GroomedAlertEuthymic and Hopeless  Type of Therapy: Individual Therapy  Treatment Goals addressed: Coping  Interventions: Motivational Interviewing, Solution Focused and Supportive  Summary: Starlena Beil is a 51 y.o. female who presents with depression.  Peyten was frustrated with herself today for she reported that she is not doing anything to help herself. She is knowingly sabotaging herself.  She has a harder time at night and she will eat the equivalent of more than one meal. She keeps craving more food - she does fine in daytime.  Night time is a common time for binges. Discussed the physiological and psychological issues around eating behavior.  She is sure that her weight is her main issue - she feels insecure and does not want to be around people when she is heavy. She knows she would feel better physically and psychologically if she would lose weight.  Gave her some education about the physiology of cravings and the psychological issues.  She seems to be needing to fill herself up and then does not feel satisfied.  Discussed how it will be important to work on understanding what makes her feel full that is not food.  Have discussed the fact that counselor is overweight and understands the issues from a personal as well as professional.  Counselor is using [employer] watchers and feel it is a good program - sharing some recipes with her that fit her food desires.  She actually likes vegetables better than meats.  Suggested that she make a food journal and then get a consultation with a nutritionist.  She needs to understand the overall information about weight control but she has individual likes and dislikes that need to be taken into consideration.   Suicidal/Homicidal: Nowithout intent/plan  Therapist Response: Denies any suicidal ideation.    Plan: Return again in 1  weeks.  Diagnosis: Axis I: Major Depressive disorder recurrent moderate    Axis II: Deferred    Raley Novicki,JUDITH A, LCSW 02/20/2012

## 2012-02-22 ENCOUNTER — Ambulatory Visit (INDEPENDENT_AMBULATORY_CARE_PROVIDER_SITE_OTHER): Payer: Medicare Other | Admitting: Psychiatry

## 2012-02-22 VITALS — BP 129/80 | HR 77 | Ht 64.0 in | Wt 262.0 lb

## 2012-02-22 DIAGNOSIS — F332 Major depressive disorder, recurrent severe without psychotic features: Secondary | ICD-10-CM | POA: Diagnosis not present

## 2012-02-22 NOTE — Progress Notes (Signed)
[facility] Behavioral Health Follow-up Outpatient Visit  Idaliz Tinkle 07/12/1961   History of Chief Complaint:  HPI Comments: Ms. Lotz is a 51 y/o woman with a past psychiatric history Major Depressive Disorder, severe, recurrent, and Social Phobia.  The patient report some passive suicidal ideation without plans, or intent. She reports this occurred with while she was upset with her son. She reports this as a fleeting thought. She report some impulsivity mainly when it comes to buying cars  She reports feeling like she will feel like changing cars if there is a minor imperfection in the car such as a chipped windshield. She does report that she has changed her car 5 times over the past two years due to minor imperfections, and as she would put down a large down payment has lost a lot of money.   Review of Systems  Constitutional: Negative for unexpected weight change.  Respiratory: Negative for chest tightness and shortness of breath.  Cardiovascular: Negative for chest pain and palpitations.  Neurological: Negative for dizziness, seizures, syncope and headaches.  Psychiatric/Behavioral: Positive for decreased concentration. Negative for suicidal ideas, hallucinations, behavioral problems, confusion and agitation.   Depressive Symptoms: depressed mood,  anhedonia,  hypersomnia,  fatigue,  feelings of worthlessness/guilt,  difficulty concentrating,  impaired memory,  increased appetite-some improvement  (Hypo) Manic Symptoms:  Elevated Mood: No  Irritable Mood: Yes  Grandiosity: No  Distractibility: Yes  Lability of Mood: Yes  Delusions: No  Hallucinations: No  Impulsivity: No  Sexually Inappropriate Behavior: No  Financial Extravagance: Yes, with cars, recently. Had changed cars 5 times. Flight of Ideas: No  Mood Swings from irritable, sad, to rare periods of elevated mood. Anxiety Symptoms:  Excessive Worry: Yes  Panic Symptoms: No-will remove herself from situations prior  to onset of panic attacks, Agoraphobia: Yes Obsessive Compulsive: No  Symptoms: None,  Specific Phobias: No  Social Anxiety: Yes  Psychotic Symptoms:  Hallucinations: No None  Delusions: No  Paranoia: No  Ideas of Reference: No  PTSD Symptoms:  Ever had a traumatic exposure: Yes  Had a traumatic exposure in the last month: No  Re-experiencing: No None  Hypervigilance: Yes  Hyperarousal: Yes Difficulty Concentrating  Irritability/Anger  Avoidance: Yes Decreased Interest/Participation  Traumatic Brain Injury: No none   Physical Exam  Constitutional: She appears well-developed and well-nourished. No distress.  Skin: She is not diaphoretic.    Past Psychiatric History:  Diagnosis: Major Depressive Disorder, recurrent, severe   Hospitalizations: Several since age 89   Outpatient Care: Since   Substance Abuse Care: none   Self-Mutilation: Patient denies   Suicidal Attempts: Three attempts   Violent Behaviors: Patient denies.   Past Medical History:  Past Medical History   Diagnosis  Date   .  Hypertension    .  Bipolar 1 disorder  2010     ect   .  Anxiety    .  Asthma    .  Fibroids     History of Loss of Consciousness: No  Seizure History: No  Cardiac History: Yes-HTN  Allergies: No Known Allergies  Current Medications:  Current Outpatient Prescriptions   Medication  Sig  Dispense  Refill   .  albuterol (PROVENTIL) (2.5 MG/3ML) 0.083% nebulizer solution  Take 2.5 mg by nebulization every 6 (six) hours as needed.     Marland Kitchen  buPROPion (WELLBUTRIN SR) 100 MG 12 hr tablet  Take two tabs 2 times daily  120 tablet  2   .  buPROPion (  WELLBUTRIN) 100 MG tablet      .  clonazePAM (KLONOPIN) 0.5 MG tablet  Take 1 tablet (0.5 mg total) by mouth 2 (two) times daily.  60 tablet  0   .  Flaxseed, Linseed, 1000 MG CAPS  Take by mouth.     Marland Kitchen  lisinopril-hydrochlorothiazide (PRINZIDE,ZESTORETIC) 10-12.5 MG per tablet  Take 1 tablet by mouth daily.     Marland Kitchen  lithium carbonate (LITHOBID)  300 MG CR tablet  Two tabs at bedtime  180 tablet  0   .  Multiple Vitamin (MULTIVITAMIN) capsule  Take 1 capsule by mouth daily.     Marland Kitchen  PARoxetine (PAXIL) 30 MG tablet  Take 1 tablet (30 mg total) by mouth every morning.  90 tablet  0   .  PROAIR HFA 108 (90 BASE) MCG/ACT inhaler      .  vitamin B-12 (CYANOCOBALAMIN) 1000 MCG tablet  Take 1,000 mcg by mouth daily.      Previous Psychotropic Medications:  Medication   Bupropion   Paxil   Lithium   Lamictal-possible rash   Depakote-spacy   Prozac-choking sensation   Geodon-didn't work.   abilify-didn't want to take.   Substance Abuse History in the last 12 months:  Caffeine-8 ounce  Blackouts: No    Family History:  Family History   Problem  Relation  Age of Onset   .  Adopted: Yes   .  Aneurysm  Father     Mental Status Examination/Evaluation:  Objective: Appearance: Well Groomed   Eye Contact:: Good   Speech: Clear and Coherent and Normal Rate   Volume: Normal   Mood: "not so great"   Affect: Appropriate, Congruent and Restricted   Thought Process: Goal Directed, Logical and Loose   Orientation: Full   Thought Content: WDL   Suicidal Thoughts: Patient denies  Homicidal Thoughts: Patient denies  Judgement: Fair   Insight: Fair   Psychomotor Activity: Normal   Akathisia: No   Handed: Right   AIMS (if indicated): Not indicated at this time.   Assets: Communication Skills  Desire for Improvement   Memory: Immediate 2/3, Recent: 3/3  Assessment:  AXIS I  Major Depressive Disorder, severe, recurrent, and Social Phobia, rule out Bipolar Disorder, NOS  AXIS II  No diagnosis   AXIS III  .  Hypertension     .  Asthma     .  Fibroids    AXIS IV  other psychosocial or environmental problems and problems with primary support group   AXIS V   GAF: 48     Treatment Plan/Recommendations:  1. Affirm with the patient that the medications are taken as ordered. Patient expressed understanding of how their medications were to  be used.  2. Continue the following psychiatric medications as written prior to this appointment with the following changes:  a) Decrease Clonazepam 0.5 mg Qday, patient does not take two tablets daily.  b) Buproprion 100 mg BID  c) Paroxetine 30 mg daily  d) Lithium Carbonate 300 mg BID-as patient's depression with suicidal ideation not controlled with antidepressants alone. Will consider a trial increase of 300 mg TID if patient reports any worsening, increase of passive suicidal ideation, or increase in impulsive behavior. The patient noted improvement of mood with divided doses of Lithium (she had been taking all of the medication at bedtime in Jan 2013) this correlates with an increase in Lithium levels from 0.56 to 0.61.  e) patient reports the current combination of medications has  brought about the most stability.  3. Therapy: brief supportive therapy provided. Discussed psychosocial stressors. Continue current services. Continue Individual therapy. 4. Risks and benefits, side effects and alternatives discussed with patient, she was given an opportunity to ask questions about her medication, illness, and treatment. All current psychiatric medications have been reviewed and discussed with the patient and adjusted as clinically appropriate. The patient has been provided an accurate and updated list of the medications being now prescribed.  5. Patient told to call clinic if any problems occur. Patient advised to go to ER if she should develop SI/HI, side effects, or if symptoms worsen. Has crisis numbers to call if needed.  6.  Labs reviewed with the patient. Will continue random urine drug screens as long as patient is on clonazepam. 7. The patient was encouraged to keep all PCP and specialty clinic appointments.  8. Patient was instructed to return to clinic in 1 month.  9. The patient expressed understanding of the plan and agrees with the above.   Jacqulyn Cane, MD

## 2012-02-24 ENCOUNTER — Encounter (HOSPITAL_COMMUNITY): Payer: Self-pay | Admitting: Psychiatry

## 2012-02-27 ENCOUNTER — Ambulatory Visit (INDEPENDENT_AMBULATORY_CARE_PROVIDER_SITE_OTHER): Payer: Medicare Other | Admitting: Licensed Clinical Social Worker

## 2012-02-27 DIAGNOSIS — F419 Anxiety disorder, unspecified: Secondary | ICD-10-CM

## 2012-02-27 DIAGNOSIS — F341 Dysthymic disorder: Secondary | ICD-10-CM

## 2012-02-27 DIAGNOSIS — F329 Major depressive disorder, single episode, unspecified: Secondary | ICD-10-CM

## 2012-02-27 DIAGNOSIS — F339 Major depressive disorder, recurrent, unspecified: Secondary | ICD-10-CM

## 2012-02-27 NOTE — Patient Instructions (Signed)
Eat on following schedule:  10:00 am, 1:00 pm, 4:00 pm, 8:00 pm and bedtime. Have protein with each meal Work on positive self talk We are working on new beginnings!!

## 2012-02-27 NOTE — Progress Notes (Signed)
   THERAPIST PROGRESS NOTE  Session Time: 10:00 - 11:00  Participation Level: Active  Behavioral Response: Well GroomedAlertDepressed  Type of Therapy: Individual Therapy  Treatment Goals addressed: Anxiety and Coping  Interventions: Motivational Interviewing, Solution Focused, Supportive and Reframing  Summary: Julie Powell is a 51 y.o. female who presents with depression and anxiety.  Today Julie Powell was glad to receive some recipes to try on her task of losing weight.  Exploring feelings throughout her day, triggers for eating, pattern of eating, history from bariatric surgery to present, identifying problem areas, and her history of social phobia which was present when she went to first grade.  She does not eat a lot of snack foods - she tends to eat several meals between the hours of 2 PM and 12 midnight when she goes to bed.  Discussed a schedule of eating during the day - adding protein to her diet at crucial points.  She is not eating foods which will hold her from the feeling of hunger.  If she plans on what she will eat at different times she would have something to look forward to.  Cannot eat cheese or milk products for she is lactose intolerant. She can eat peanut butter, hard boiled eggs and protein powder in drinks.  She can also eat meats  - although not her favorite - chicken and perhaps [place] or chicken hot dogs or sausage.  She gets cravings for certain foods and then it goes away for a period of time.  Will have to work on more positive self talk.   Explored her history with social phobia which has been forever. Had a very unsatisfying social history in all grades in school.  No dating - only one friend off and on.  So did not have opportunities to learn a lot through crucial years.  She was always given the message that she was defective between her weight and social problems by family.  They did not know how to encourage her.  She does not like to be noticed and she feels noticed  as a heavy person.  When she lost weight she felt she was not noticed and she liked that - she felt normal.  Her downfall after the bariatric surgery started about 2 years after surgery.  Her problematic relationship with a man really completed the self destructive process of gaining the weight back and going back in her shell.  She has always done well in school - A's and has been competent in her work.  Although she feels she never found satisfying work.   She has a lot negative tapes that we have agreed need to change..   Suicidal/Homicidal: Nowithout intent/plan  Plan: Return again in 2 weeks.  Diagnosis: Axis I: Major depressive disorder, recurrent    Axis II: Deferred    Devron Cohick,JUDITH A, LCSW 02/27/2012

## 2012-02-29 DIAGNOSIS — R52 Pain, unspecified: Secondary | ICD-10-CM | POA: Diagnosis not present

## 2012-02-29 DIAGNOSIS — R1013 Epigastric pain: Secondary | ICD-10-CM | POA: Diagnosis not present

## 2012-02-29 DIAGNOSIS — M722 Plantar fascial fibromatosis: Secondary | ICD-10-CM | POA: Diagnosis not present

## 2012-02-29 DIAGNOSIS — Z79899 Other long term (current) drug therapy: Secondary | ICD-10-CM | POA: Diagnosis not present

## 2012-02-29 DIAGNOSIS — I10 Essential (primary) hypertension: Secondary | ICD-10-CM | POA: Diagnosis not present

## 2012-03-11 DIAGNOSIS — R1013 Epigastric pain: Secondary | ICD-10-CM | POA: Diagnosis not present

## 2012-03-12 ENCOUNTER — Ambulatory Visit (INDEPENDENT_AMBULATORY_CARE_PROVIDER_SITE_OTHER): Payer: Medicare Other | Admitting: Licensed Clinical Social Worker

## 2012-03-12 DIAGNOSIS — F339 Major depressive disorder, recurrent, unspecified: Secondary | ICD-10-CM | POA: Diagnosis not present

## 2012-03-12 NOTE — Progress Notes (Signed)
   THERAPIST PROGRESS NOTE  Session Time: 10:00 - 11:00  Participation Level: Active  Behavioral Response: CasualAlertEuthymic  Type of Therapy: Individual Therapy  Treatment Goals addressed: Anger and Anxiety  Interventions: Motivational Interviewing, Solution Focused and Supportive  Summary: Julie Powell is a 51 y.o. female who presents with depression and isolation.  Today [name] reported that she has given up fast food and spreads her morning [employer] out of two days with skim milk and sugar free syrup.  She has not been able to cut back on the  Overeating in the evening and exercise.  She stated that she needed to get walking at the [place] again.  Counselor gave her positive feedback on what she had accomplished.  She tends to emphasize the negative.  Discussed the issue of eating in the evening - eating in front of TV, loneliness, boredom, and her oral needs.  She is not hungry = she desires the taste.  The taste makes her feel good.  Discussed trying to increase the time between eating.  She said she would work on that.  She also needs more support - if she could call someone instead of eating.  She does not like talking on the phone that much.  Discussed looking up some OA meetings to work on support and these groups were not going to be judgmental about her weight. She reported that she did take her female friend to his medical appointment and they actually had a good time.  But then she said that they were talking and joking around on the phone when she said something that apparently upset him and he hung up.  Now she is not going to call him to clarilfy - for he should apologize to her.  He has left her a message like he did nothing unusual.  Her attitude is that she does not want to give in - a power game.  She did laugh and admit that she was stubborn.  He is someone that she does enjoy somewhat and she need to keep contacts.  She is having trouble keeping in touch with other friends - she  does by e-mail but she does not want to be vulnerable to questions that might be asked on the phone.   She has bad feelings about her weight gain and being on disability. She agreed to write down times that she ate and what she ate for next week.  Suicidal/Homicidal: Nowithout intent/plan  Plan: Return again in 1 weeks.  Diagnosis: Axis I: Major depression recurrent mod    Axis II: Deferred    Reginal Wojcicki,JUDITH A, LCSW 03/12/2012

## 2012-03-12 NOTE — Patient Instructions (Addendum)
Counselor will bring book for her next week Luke has agreed to write down times that she eats - feelings and physical location Will attempt to prolong time in between eating.  Kudos for dropping fast food and working out plan to get special drink and use for two days

## 2012-03-19 ENCOUNTER — Ambulatory Visit (HOSPITAL_COMMUNITY): Payer: Self-pay | Admitting: Licensed Clinical Social Worker

## 2012-03-21 ENCOUNTER — Ambulatory Visit (INDEPENDENT_AMBULATORY_CARE_PROVIDER_SITE_OTHER): Payer: Medicare Other | Admitting: Psychiatry

## 2012-03-21 ENCOUNTER — Encounter (HOSPITAL_COMMUNITY): Payer: Self-pay | Admitting: Psychiatry

## 2012-03-21 VITALS — BP 123/78 | HR 82 | Ht 64.0 in | Wt 263.0 lb

## 2012-03-21 DIAGNOSIS — F411 Generalized anxiety disorder: Secondary | ICD-10-CM

## 2012-03-21 DIAGNOSIS — F332 Major depressive disorder, recurrent severe without psychotic features: Secondary | ICD-10-CM | POA: Diagnosis not present

## 2012-03-21 DIAGNOSIS — F419 Anxiety disorder, unspecified: Secondary | ICD-10-CM

## 2012-03-21 MED ORDER — LITHIUM CARBONATE ER 300 MG PO TBCR: EXTENDED_RELEASE_TABLET | ORAL | Status: DC

## 2012-03-21 MED ORDER — CLONAZEPAM 0.5 MG PO TABS: 0.5000 mg | ORAL_TABLET | Freq: Once | ORAL | Status: DC

## 2012-03-21 MED ORDER — BUPROPION HCL ER (SR) 100 MG PO TB12: ORAL_TABLET | ORAL | Status: DC

## 2012-03-21 NOTE — Progress Notes (Signed)
[facility] Behavioral Health Follow-up Outpatient Visit  Julie Powell Jul 22, 1961  Date: 03/21/12   There were no vitals filed for this visit.  History of Chief Complaint:  HPI Comments: Julie Powell is a 51 y/o woman with a past psychiatric history Major Depressive Disorder, severe, recurrent, and Social Phobia. The patient denies any further passive suicidal ideation without plans, or intent. The patient denies any further impulsive buying. She states that she has some stress about her son who will soon be graduating.  She reports that she will have less financial aid when her son turns 73 but that he is currently working and hopes to go to school.  Review of Systems: Constitutional: Negative for unexpected weight change.  Respiratory: Negative for chest tightness and shortness of breath.  Cardiovascular: Negative for chest pain and palpitations.  Neurological: Negative for dizziness, seizures, syncope and headaches.  Psychiatric/Behavioral:  Negative for suicidal ideas, hallucinations, behavioral problems, confusion and agitation.   Depressive Symptoms:  anhedonia,  fatigue,  feelings of worthlessness/guilt,  increased appetite-some improvement   (Hypo) Manic Symptoms:  Elevated Mood: No  Irritable Mood:No Grandiosity: No  Distractibility: Yes  Lability of Mood: No Delusions: No  Hallucinations: No  Impulsivity: No  Sexually Inappropriate Behavior: No  Financial Extravagance: Yes, with cars, recently. Had changed cars 5 times.  Flight of Ideas: No  Mood Swings from irritable, sad, to rare periods of elevated mood.   Anxiety Symptoms:  Excessive Worry: Yes but decreased Panic Symptoms: No-will remove herself from situations prior to onset of panic attacks,  Agoraphobia: Yes  Obsessive Compulsive: No  Symptoms: None,  Specific Phobias: No  Social Anxiety: Yes   Psychotic Symptoms:  Hallucinations: No None  Delusions: No  Paranoia: No  Ideas of Reference: No   PTSD  Symptoms:  Ever had a traumatic exposure: Yes  Had a traumatic exposure in the last month: No  Re-experiencing: No None  Hypervigilance: Yes  Hyperarousal: Yes Difficulty Concentrating  Irritability/Anger  Avoidance: Yes Decreased Interest/Participation   Traumatic Brain Injury: No none   Filed Vitals:   03/21/12 1026  BP: 123/78  Pulse: 82  Height: 5\' 4"  (1.626 m)  Weight: 263 lb (119.296 kg)     Physical Exam  Constitutional: She appears well-developed and well-nourished. No distress.  Skin: She is not diaphoretic.   Past Psychiatric History:  Diagnosis: Major Depressive Disorder, recurrent, severe   Hospitalizations: Several since age 16   Outpatient Care: Since   Substance Abuse Care: none   Self-Mutilation: Patient denies   Suicidal Attempts: Three attempts   Violent Behaviors: Patient denies.    Past Medical History:  Past Medical History   Diagnosis  Date   .  Hypertension    .  Bipolar 1 disorder  2010     ect   .  Anxiety    .  Asthma    .  Fibroids    History of Loss of Consciousness: No  Seizure History: No  Cardiac History: Yes-HTN   Allergies: No Known Allergies   Current Medications:  Current Outpatient Prescriptions on File Prior to Visit  Medication Sig Dispense Refill  . Flaxseed, Linseed, 1000 MG CAPS Take by mouth.        Marland Kitchen lisinopril-hydrochlorothiazide (PRINZIDE,ZESTORETIC) 10-12.5 MG per tablet Take 1 tablet by mouth daily.       . Multiple Vitamin (MULTIVITAMIN) capsule Take 1 capsule by mouth daily.        Marland Kitchen omeprazole (PRILOSEC) 40 MG capsule Take 30  mg by mouth.      Marland Kitchen PARoxetine (PAXIL) 30 MG tablet Take 0.5 tablets (15 mg total) by mouth every morning.  45 tablet  1  . PROAIR HFA 108 (90 BASE) MCG/ACT inhaler       . vitamin B-12 (CYANOCOBALAMIN) 1000 MCG tablet Take 1,000 mcg by mouth daily.        Marland Kitchen  buPROPion (WELLBUTRIN SR) 100 MG 12 hr tablet Take two tabs 2 times daily  180 tablet  1  . clonazePAM (KLONOPIN) 0.5 MG tablet  Take 1 tablet (0.5 mg total) by mouth once.  30 tablet  0  .  lithium carbonate (LITHOBID) 300 MG CR tablet Ne tablet in the morning and one tablet at bedtime  180 tablet  0    Previous Psychotropic Medications:  Medication   Bupropion   Paxil   Lithium   Lamictal-possible rash   Depakote-spacy   Prozac-choking sensation   Geodon-didn't work.   abilify-didn't want to take.   Substance Abuse History in the last 12 months:  Caffeine-8 ounce  Blackouts: No  Family History:  Family History   Problem  Relation  Age of Onset   .  Adopted: Yes   .  Aneurysm  Father     Mental Status Examination/Evaluation:  Objective: Appearance: Well Groomed   Eye Contact:: Good   Speech: Clear and Coherent and Normal Rate   Volume: Normal   Mood: "neutral"   Affect: Appropriate, Congruent and Restricted   Thought Process: Goal Directed, Logical and Loose   Orientation: Full   Thought Content: WDL   Suicidal Thoughts: Patient denies   Homicidal Thoughts: Patient denies   Judgement: Fair   Insight: Fair   Psychomotor Activity: Normal   Akathisia: No   Handed: Right   AIMS (if indicated): Not indicated at this time.   Assets: Communication Skills  Desire for Improvement   Memory: Immediate 3/3, Recent: 1/3   Assessment:  AXIS I  Major Depressive Disorder, severe, recurrent, and Social Phobia, rule out Bipolar Disorder, NOS   AXIS II  No diagnosis    AXIS III  .  Hypertension     .  Asthma     .  Fibroids     AXIS IV  other psychosocial or environmental problems and problems with primary support group   AXIS V  GAF: 48    Treatment Plan/Recommendations:  1. Affirm with the patient that the medications are taken as ordered. Patient expressed understanding of how their medications were to be used.  2. Continue the following psychiatric medications as written prior to this appointment with the following changes:  a) Decrease Clonazepam 0.5 mg Qday, patient asked to take a half or no  tablet if possible. b) Buproprion 100 mg BID  c) Paroxetine 30 mg daily-half tablet, patient reports she has been weaning off this medication. d) Lithium Carbonate 300 mg BID-as patient's depression with suicidal ideation not controlled with antidepressants alone.  The patient noted improvement of mood with divided doses of Lithium (she had been taking all of the medication at bedtime in Jan 2013) this correlates with an increase in Lithium levels from 0.56 to 0.61.  e) patient reports the current combination of medications has brought about the most stability.  3. Therapy: brief supportive therapy provided. Discussed psychosocial stressors. Continue current services. Continue Individual therapy.  4. Risks and benefits, side effects and alternatives discussed with patient, she was given an opportunity to ask questions about her medication,  illness, and treatment. All current psychiatric medications have been reviewed and discussed with the patient and adjusted as clinically appropriate. The patient has been provided an accurate and updated list of the medications being now prescribed.  5. Patient told to call clinic if any problems occur. Patient advised to go to ER if she should develop SI/HI, side effects, or if symptoms worsen. Has crisis numbers to call if needed.  6. No labs warranted at this time. Will continue random urine drug screens as long as patient is on clonazepam.  7. The patient was encouraged to keep all PCP and specialty clinic appointments.  8. Patient was instructed to return to clinic in 1 month.  9. The patient expressed understanding of the plan and agrees with the above.  Jacqulyn Cane, MD

## 2012-03-26 ENCOUNTER — Ambulatory Visit (INDEPENDENT_AMBULATORY_CARE_PROVIDER_SITE_OTHER): Payer: Medicare Other | Admitting: Licensed Clinical Social Worker

## 2012-03-26 ENCOUNTER — Encounter (HOSPITAL_COMMUNITY): Payer: Self-pay | Admitting: Licensed Clinical Social Worker

## 2012-03-26 DIAGNOSIS — F329 Major depressive disorder, single episode, unspecified: Secondary | ICD-10-CM | POA: Diagnosis not present

## 2012-03-26 NOTE — Progress Notes (Signed)
THERAPIST PROGRESS NOTE  Session Time: 10 - 11:00  Participation Level: Active  Behavioral Response: Well GroomedAlertDepressed  Type of Therapy: Individual Therapy  Treatment Goals addressed: Anger and Coping  Interventions: Motivational Interviewing, Solution Focused and Supportive  Summary: Julie Powell is a 51 y.o. female who presents with struggles with depression, weight and in problems wit isolation.  Julie Powell came in frustrated reporting that she feels frustrated with herself because she quit her log that she started in four days.  She loses her motivation to continue to work on herself.  Asked what she learned in that time - she realized that she was not eating as many calories as she thought.  Only about 2200 and if she exercised she might not be in too bad a shape.  She lose motivation to do anything for herself.  She has trouble seeing her friends because she has gained the weight back. She felt better when she had the weight off because she was not so noticeable.  Now she feels that everyone notices her and comments about her.  We discussed and clarified more of her family history - she experienced a lot of deaths of care taking adults in her childhood.  At 4 she lost her mother and then at 8 she lost her adopted mother, between her adopted mother and her adopted mother's sister - between ages [age] she was with two other care taking families that helped her father out.  There were deaths in those family too.  She was the youngest of 6 children.  It turns out they were all half siblings for she had a different father.  Her oldest sister Julie Powell also had a different father.  While the 4 middle children were the same father.  Her half siblings are Julie Powell age 62, Julie Powell age 33, Julie Powell age 76, Julie Powell age 15 Julie Powell age 52.  Julie Powell  When she was taken by her biological mother's friend at age 15 the other children went with paternal grandparents and father's sister's husband and their 5 children.  They  lived in abject poverty.  The man labeled as her father was the father to the others - he was an alcoholic and is now dead.  Her own father she met when she was age 53 .Julie KitchenThe father she remembers very well is her adopted father - from age 15 to age 79 and then he too died.  Her connections to family are complicated and filled with losses.  She believes that she does not want to be too connected to anyone for they will die.  She is in touch with her siblings and she was surprised when all of them saved the Powell and came to her graduation from college.  Her oldest sister did ok with her life while the other sister did live in poverty and all the boys ended up in jail - alcoholics and a couple of them on drugs.  Julie Powell got himself out of all of that and did well - he even has a couple of houses that he rents out.   She borrowed two books one on eating issues and the other on shame and guilt.  She stated that she would read and see what came up for her. There may be some issues that will get identified that will help her with her motivation to feel better and be able to do waht she she needs to do to care for self.    Suicidal/Homicidal: Nowithout intent/plan  Plan: Return  again in 1 weeks.  Diagnosis: Axis I: Major Depression, Recurrent severe    Axis II: Deferred    Julie Powell,JUDITH A, LCSW 03/26/2012

## 2012-03-28 DIAGNOSIS — R1013 Epigastric pain: Secondary | ICD-10-CM | POA: Diagnosis not present

## 2012-04-02 ENCOUNTER — Ambulatory Visit (INDEPENDENT_AMBULATORY_CARE_PROVIDER_SITE_OTHER): Payer: Medicare Other | Admitting: Licensed Clinical Social Worker

## 2012-04-02 DIAGNOSIS — F329 Major depressive disorder, single episode, unspecified: Secondary | ICD-10-CM

## 2012-04-02 NOTE — Patient Instructions (Signed)
Take Julie Powell personality inventory Go for interview and see about the part time job that you were called about Continue to limit intake of food

## 2012-04-02 NOTE — Progress Notes (Signed)
   THERAPIST PROGRESS NOTE  Session Time: 11:00 - 12:00  Participation Level: Active  Behavioral Response: Well GroomedAlertEuthymic  Type of Therapy: Individual Therapy  Treatment Goals addressed: Coping and Diagnosis: depression  Interventions: Motivational Interviewing, Solution Focused and Supportive  Summary: Julie Powell is a 51 y.o. female who presents with struggles with depression, isolation and weight problems.  [name] brought back books she borrowed on eating problems and shame and guilt.  She reported that she identified with the Shame and Guilt one more.  She could see how the shaming has left her not wanting to be around people and her feelings about herself.  She could not identify shaming behaviors in her past just the result of shaming.  She was puzzled by her behavior last evening  -  She had a chocolate chip cookie which she usually craves and she did not want it and then she made herself eat it.  She seems determined to self sabotage herself.  She knows the self soothing things to do but she always turns to food.  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  She is nervous about trying it but she realizes she has nothing to lose.  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  She has a degree in criminal justice.  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  She did not like the substance abuse counseling for they used a method of yelling a putting down the prisoners.  She did very well at protective service investigator for she was alone a lot and she could handle the emotions well for she is distant anyway. .The last job was overwhelming because of all the people she had to deal with and that is when she went out on disability.  She has a hard time seeing herself as competent.  Suggested taking the myers briggs personality profile and we could discuss the kind of work that would be helpful to her.   Suicidal/Homicidal: Nowithout intent/plan  Plan: Return again in 1 weeks.  Diagnosis: Axis I: Major Depression, Recurrent severe    Axis II: Deferred    Linkyn Gobin,JUDITH A, LCSW 04/02/2012

## 2012-04-04 DIAGNOSIS — R1013 Epigastric pain: Secondary | ICD-10-CM | POA: Diagnosis not present

## 2012-04-04 DIAGNOSIS — K7689 Other specified diseases of liver: Secondary | ICD-10-CM | POA: Diagnosis not present

## 2012-04-10 ENCOUNTER — Ambulatory Visit (HOSPITAL_COMMUNITY): Payer: Self-pay | Admitting: Licensed Clinical Social Worker

## 2012-04-11 DIAGNOSIS — Z9889 Other specified postprocedural states: Secondary | ICD-10-CM | POA: Diagnosis not present

## 2012-04-11 DIAGNOSIS — N289 Disorder of kidney and ureter, unspecified: Secondary | ICD-10-CM | POA: Diagnosis not present

## 2012-04-18 ENCOUNTER — Encounter (HOSPITAL_COMMUNITY): Payer: Self-pay | Admitting: Psychiatry

## 2012-04-18 ENCOUNTER — Ambulatory Visit (INDEPENDENT_AMBULATORY_CARE_PROVIDER_SITE_OTHER): Payer: Medicare Other | Admitting: Psychiatry

## 2012-04-18 VITALS — BP 121/82 | HR 89 | Ht 64.0 in | Wt 262.0 lb

## 2012-04-18 DIAGNOSIS — F332 Major depressive disorder, recurrent severe without psychotic features: Secondary | ICD-10-CM

## 2012-04-18 DIAGNOSIS — F4010 Social phobia, unspecified: Secondary | ICD-10-CM

## 2012-04-18 DIAGNOSIS — F419 Anxiety disorder, unspecified: Secondary | ICD-10-CM

## 2012-04-18 MED ORDER — CLONAZEPAM 0.5 MG PO TABS: 0.2500 mg | ORAL_TABLET | Freq: Once | ORAL | Status: DC

## 2012-04-18 MED ORDER — LITHIUM CARBONATE ER 300 MG PO TBCR: EXTENDED_RELEASE_TABLET | ORAL | Status: DC

## 2012-04-18 NOTE — Progress Notes (Signed)
[facility] Follow-up Outpatient Visit  Julie Powell 23-Feb-1961  Date:    Date: 04/18/12  History of Chief Complaint:   HPI Comments: [name] is a 51 y/o woman with a past psychiatric history Major Depressive Disorder, severe, recurrent, and Social Phobia. The patient denies any further passive suicidal ideation without plans, or intent. The patient denies any further impulsive buying.  She states that she has some stress about her son who will soon be graduating, but reports that he is continuing to work and has been behaving more responsibly. The patient states she is always concerned  about the people her son associated with due to his problems with substance abuse.  She reports that she has also been avoiding family despite knowing the importance of a social network.   In the area of affective symptoms, patient appears mildly anxious. Patient denies current suicidal ideation, intent, or plan. Patient denies current homicidal ideation, intent, or plan. Patient denies auditory hallucinations. Patient denies visual hallucinations. Patient denies symptoms of paranoia. Patient states sleep is poor, without Ambien.  Appetite is fair and she reports making an effort not to eat excessively. Energy level is fair. Patient endorses little improvement in symptoms of anhedonia.She states she continues to experience some irritability. Patient denies hopelessness, helplessness, or guilt.   Denies any recent episodes consistent with mania, particularly decreased need for sleep with increased energy, grandiosity, impulsivity, hyperverbal and pressured speech, or increased productivity. The patient denies symptoms consistent with psychosis particularly auditory or visual hallucinations, thought broadcasting/insertion/withdrawal, or ideas of reference. The patient endorses excessive worry but will remove herself from situations prior to getting to experiencing physical symptoms or panic attacks.  She reports a  history of trauma and symptoms consistent with PTSD including hypervigilance, feelings of numbness or inability to connect with others, and she endorses avoidance of friends and family. She endorse avoidance of social interaction as a means of coping with anxiety.    Traumatic Brain Injury: No none   Review of Systems:  Constitutional: Negative for unexpected weight change.  Respiratory: Negative for chest tightness and shortness of breath.  Cardiovascular: Negative for chest pain and palpitations.  Neurological: Negative for dizziness, seizures, syncope and headaches.  Psychiatric/Behavioral: Negative for suicidal ideas, hallucinations, behavioral problems, confusion and agitation.   Filed Vitals:   04/18/12 1009  BP: 121/82  Pulse: 89  Height: 5\' 4"  (1.626 m)  Weight: 262 lb (118.842 kg)    Physical Exam  Constitutional: She appears well-developed and well-nourished. No distress.  Skin: She is not diaphoretic.   Past Psychiatric History: Reviewed Diagnosis: Major Depressive Disorder, recurrent, severe   Hospitalizations: Several since age 63   Outpatient Care: Since   Substance Abuse Care: none   Self-Mutilation: Patient denies   Suicidal Attempts: Three attempts   Violent Behaviors: Patient denies.    Past Medical History:  Reviewed Past Medical History   Diagnosis  Date   .  Hypertension    .  Bipolar 1 disorder  2010     ECT   .  Anxiety    .  Asthma    .  Fibroids     History of Loss of Consciousness: No  Seizure History: No  Cardiac History: Yes-HTN  Allergies: No Known Allergies   Current Medications:  Reviewed Current Outpatient Prescriptions on File Prior to Visit  Medication Sig Dispense Refill  . buPROPion (WELLBUTRIN SR) 100 MG 12 hr tablet Take two tabs 2 times daily  180 tablet  1  . clonazePAM (KLONOPIN) 0.5 MG tablet Take 1 tablet (0.5 mg total) by mouth once.  30 tablet  0  . Flaxseed, Linseed, 1000 MG CAPS Take by mouth.        Marland Kitchen  lisinopril-hydrochlorothiazide (PRINZIDE,ZESTORETIC) 10-12.5 MG per tablet Take 1 tablet by mouth daily.       Marland Kitchen lithium carbonate (LITHOBID) 300 MG CR tablet Take one tablet in the morning and one tablet at bedtime,  180 tablet  0  . Multiple Vitamin (MULTIVITAMIN) capsule Take 1 capsule by mouth daily.        Marland Kitchen PARoxetine (PAXIL) 30 MG tablet Take 0.5 tablets (15 mg total) by mouth every morning.  45 tablet  1  . PROAIR HFA 108 (90 BASE) MCG/ACT inhaler       . vitamin B-12 (CYANOCOBALAMIN) 1000 MCG tablet Take by mouth 5 (five) times daily.         Previous Psychotropic Medications:  Reviewed Medication   Bupropion   Paxil   Lithium   Lamictal-possible rash   Depakote-"spacey"   Prozac-choking sensation   Geodon-didn't work.   Aripiprazole-didn't want to take.    Substance Abuse History in the last 12 months:  Caffeine-8 ounce Alcohol: Patient denies. Tobacco: Patient denies. Illicit Drugs: Patient denies.  Blackouts: No   Family History:  Reviewed Family History   Problem  Relation  Age of Onset   .  Adopted: Yes   .  Aneurysm  Father     Mental Status Examination/Evaluation:  Objective: Appearance: Well Groomed   Eye Contact:: Good   Speech: Clear and Coherent and Normal Rate   Volume: Normal   Mood: "weepy"   Affect: Appropriate, Congruent and Restricted   Thought Process: Goal Directed, Logical and Loose   Orientation: Full   Thought Content: WEDL   Suicidal Thoughts: Patient denies   Homicidal Thoughts: Patient denies   Judgement: Fair   Insight: Fair   Psychomotor Activity: Normal   Akathisia: No   Handed: Right   AIMS (if indicated): Not indicated at this time.   Assets: Communication Skills  Desire for Improvement   Memory: Immediate 3/3, Recent: 1/3   Assessment:  AXIS I  Major Depressive Disorder, severe, recurrent, and Social Phobia, rule out Bipolar Disorder, NOS   AXIS II  No diagnosis    AXIS III  .  Hypertension     .  Asthma     .   Fibroids     AXIS IV  other psychosocial or environmental problems and problems with primary support group   AXIS V  GAF: 48    Treatment Plan/Recommendations:  1. Affirm with the patient that the medications are taken as ordered. Patient expressed understanding of how their medications were to be used.  2. Continue the following psychiatric medications as written prior to this appointment with the following changes:  a) Decrease Clonazepam 0.5 mg Qday, patient asked to take a half or no tablet if possible. Will not renew prescription. b) Buproprion 100 mg BID  c) Paroxetine 30 mg daily-half tablet. d) Will increase Lithium Carbonate 300 mg to one capsule in the morning and two capsules at bedtime, for continued symptoms of depression and irritability. e) patient reports the current combination of medications has brought about the most stability.  3. Therapy: brief supportive therapy provided. Discussed psychosocial stressors. Continue current services. Continue Individual therapy.  4. Risks and benefits, side effects and alternatives discussed with patient, she was given an opportunity to  ask questions about her medication, illness, and treatment. All current psychiatric medications have been reviewed and discussed with the patient and adjusted as clinically appropriate. The patient has been provided an accurate and updated list of the medications being now prescribed.  5. Patient told to call clinic if any problems occur. Patient advised to go to ER if she should develop SI/HI, side effects, or if symptoms worsen. Has crisis numbers to call if needed.  6. No labs warranted at this time. Will continue random urine drug screens as long as patient is on clonazepam.  7. The patient was encouraged to keep all PCP and specialty clinic appointments.  8. Patient was instructed to return to clinic in 1 month.  9. The patient expressed understanding of the plan and agrees with the above.   Jacqulyn Cane, MD

## 2012-04-22 ENCOUNTER — Telehealth (HOSPITAL_COMMUNITY): Payer: Self-pay | Admitting: Psychiatry

## 2012-04-22 ENCOUNTER — Encounter (HOSPITAL_COMMUNITY): Payer: Self-pay

## 2012-04-22 ENCOUNTER — Encounter (HOSPITAL_COMMUNITY): Payer: Self-pay | Admitting: Psychiatry

## 2012-04-22 NOTE — Telephone Encounter (Signed)
This encounter was created in error - please disregard.

## 2012-04-22 NOTE — Telephone Encounter (Signed)
Erroneous encounter

## 2012-04-23 NOTE — Telephone Encounter (Signed)
Erroneous encounter

## 2012-04-26 ENCOUNTER — Ambulatory Visit (INDEPENDENT_AMBULATORY_CARE_PROVIDER_SITE_OTHER): Payer: Medicare Other | Admitting: Licensed Clinical Social Worker

## 2012-04-26 DIAGNOSIS — F331 Major depressive disorder, recurrent, moderate: Secondary | ICD-10-CM | POA: Diagnosis not present

## 2012-04-26 NOTE — Progress Notes (Signed)
THERAPIST PROGRESS NOTE  Session:  11:00 - 12:00  Participation Level: Active  Behavioral Response: CasualAlertEuthymic  Type of Therapy: Individual Therapy  Treatment Goals addressed: Anxiety and Coping  Interventions: Motivational interview,   Summary: Julie Powell is a 51 y.o. female who presents with problems with depression.  Julie Powell did take the personality test and she came out as a ISTJ which seems to fit the way she presents in session.  She had a colonoscopy and when she was coming out of anesthesia she was aware of feeling really good - she had no fear of being judged and she could take on the world.  Then when she got fully awake she was back to her usual feeling.  She feels that when she was growing up she was shamed a lot.  She is always afraid of making a mistake and making a fool of herself.  She has always felt like an outsider - she is not really a part of anything  She feels that she cannot be in touch with anyone for she does not have a life now and she has gained her weight back.  She has changed a few habits in eating.  She is nervous about going to this wedding and people seeing that she has gained the weight back.  She is also different because of her religion which she has not been practicing.  She won't go back to meetings because of her weight issue.  Pointed out that so much revolved around her weight that we ought to keep our focus on losing weight.  Also was more strongly encouraging of her going back to her religious meetings with [facility].    Suicidal/Homicidal: Nowithout intent/plan  Plan: Return again in 2 weeks.  Diagnosis: Axis I: Major depressive disorder, recurrent mod    Axis II: Deferred    Lachanda Buczek,JUDITH A, LCSW 04/26/2012

## 2012-05-03 ENCOUNTER — Telehealth (HOSPITAL_COMMUNITY): Payer: Self-pay | Admitting: Psychiatry

## 2012-05-03 NOTE — Telephone Encounter (Signed)
Called patient to inform her that refill request for ProAir was sent to our office. Asked patient to confirm that her PCP receives the request prior to running out of her medication.

## 2012-05-06 DIAGNOSIS — M722 Plantar fascial fibromatosis: Secondary | ICD-10-CM | POA: Diagnosis not present

## 2012-05-06 DIAGNOSIS — M7750 Other enthesopathy of unspecified foot: Secondary | ICD-10-CM | POA: Diagnosis not present

## 2012-05-06 DIAGNOSIS — M7730 Calcaneal spur, unspecified foot: Secondary | ICD-10-CM | POA: Diagnosis not present

## 2012-05-13 DIAGNOSIS — M7750 Other enthesopathy of unspecified foot: Secondary | ICD-10-CM | POA: Diagnosis not present

## 2012-05-13 DIAGNOSIS — M722 Plantar fascial fibromatosis: Secondary | ICD-10-CM | POA: Diagnosis not present

## 2012-05-13 DIAGNOSIS — M7730 Calcaneal spur, unspecified foot: Secondary | ICD-10-CM | POA: Diagnosis not present

## 2012-05-16 ENCOUNTER — Ambulatory Visit (INDEPENDENT_AMBULATORY_CARE_PROVIDER_SITE_OTHER): Payer: Medicare Other | Admitting: Psychiatry

## 2012-05-16 ENCOUNTER — Encounter (HOSPITAL_COMMUNITY): Payer: Self-pay | Admitting: Psychiatry

## 2012-05-16 VITALS — BP 112/78 | HR 83 | Ht 64.0 in | Wt 262.0 lb

## 2012-05-16 DIAGNOSIS — F332 Major depressive disorder, recurrent severe without psychotic features: Secondary | ICD-10-CM

## 2012-05-16 DIAGNOSIS — F319 Bipolar disorder, unspecified: Secondary | ICD-10-CM | POA: Diagnosis not present

## 2012-05-16 DIAGNOSIS — F4010 Social phobia, unspecified: Secondary | ICD-10-CM | POA: Diagnosis not present

## 2012-05-16 LAB — BUN: BUN: 21 mg/dL (ref 6–23)

## 2012-05-16 MED ORDER — LITHIUM CARBONATE ER 300 MG PO TBCR: 300.0000 mg | EXTENDED_RELEASE_TABLET | Freq: Two times a day (BID) | ORAL | Status: DC

## 2012-05-16 MED ORDER — PAROXETINE HCL 10 MG PO TABS: ORAL_TABLET | ORAL | Status: DC

## 2012-05-16 MED ORDER — BUPROPION HCL 100 MG PO TABS: 200.0000 mg | ORAL_TABLET | Freq: Two times a day (BID) | ORAL | Status: DC

## 2012-05-16 MED ORDER — CLONAZEPAM 0.5 MG PO TABS: 0.2500 mg | ORAL_TABLET | ORAL | Status: DC | PRN

## 2012-05-16 NOTE — Progress Notes (Addendum)
[facility]al Health Follow-up Outpatient Visit  Julie Powell August 07, 1961  Date: 05/16/2012  Chief Complaint  Patient presents with  . Follow-up   HPI Comments: Julie Powell is a 51 y/o woman with a past psychiatric history Major Depressive Disorder, severe, recurrent, and Social Phobia. The patient reports she tolerated her decreased dosage of Paxil from 30 mg to 15 my for the past two weeks without any side effect or return of depression. The patient states that she continues to haves some concerns about her son with marijuana abuse.  She reports some tremors with an increase of Lithium to 900 mg, and therefore decreased her dosage of lithium to 300 mg BID. The patient denies any further passive suicidal ideation without plans, or intent. The patient denies any further impulsive buying.   In the area of affective symptoms, patient appears mildly anxious. Patient denies current suicidal ideation, intent, or plan. Patient denies current homicidal ideation, intent, or plan. Patient denies auditory hallucinations. Patient denies visual hallucinations. Patient denies symptoms of paranoia. Patient states sleep is poor, without Ambien. Appetite is fair and she reports making an effort not to eat excessively. Energy level is fair. Patient endorses little improvement in symptoms of anhedonia.She states she continues to experience some irritability. Patient denies hopelessness, helplessness, or guilt.    Denies any recent episodes consistent with mania, particularly decreased need for sleep with increased energy, grandiosity, impulsivity, hyperverbal and pressured speech, or increased productivity. The patient denies symptoms consistent with psychosis particularly auditory or visual hallucinations, thought broadcasting/insertion/withdrawal, or ideas of reference. The patient endorses excessive worry but will remove herself from situations prior to getting to experiencing physical symptoms or panic attacks.  She reports a history of trauma and symptoms consistent with PTSD including hypervigilance, feelings of numbness or inability to connect with others, and she endorses avoidance of friends and family. She endorse avoidance of social interaction as a means of coping with anxiety.   Traumatic Brain Injury: No none  Review of Systems:  Constitutional: Negative for unexpected weight change.  Respiratory: Negative for chest tightness and shortness of breath.  Cardiovascular: Positive for tremors. Negative for chest pain and palpitations.  Neurological: Negative for dizziness, seizures, syncope and headaches.  Psychiatric/Behavioral: Negative for suicidal ideas, hallucinations, behavioral problems, confusion and agitation.   Filed Vitals:   05/16/12 1016  BP: 112/78  Pulse: 83  Height: 5\' 4"  (1.626 m)  Weight: 262 lb (118.842 kg)   Physical Exam  Constitutional: She appears well-developed and well-nourished. No distress.  Skin: She is not diaphoretic.  No tremors of hands noted while patient was interviewed.   Past Psychiatric History: Reviewed  Diagnosis: Major Depressive Disorder, recurrent, severe   Hospitalizations: Several since age 77   Outpatient Care: Since   Substance Abuse Care: none   Self-Mutilation: Patient denies   Suicidal Attempts: Three attempts   Violent Behaviors: Patient denies.    Past Medical History: Reviewed  Past Medical History   Diagnosis  Date   .  Hypertension    .  Bipolar 1 disorder  2010     ECT   .  Anxiety    .  Asthma    .  Fibroids     History of Loss of Consciousness: No  Seizure History: No  Cardiac History: Yes-HTN   Allergies: No Known Allergies   Current Medications: Reviewed  Current Outpatient Prescriptions on File Prior to Visit   Medication  Sig  Dispense  Refill   .  buPROPion [facility]  SR) 100 MG 12 hr tablet  Take two tabs 2 times daily  180 tablet  1   .  clonazePAM (KLONOPIN) 0.5 MG tablet  Take 1 tablet (0.5 mg total) by  mouth once.  30 tablet  0   .  Flaxseed, Linseed, 1000 MG CAPS  Take by mouth.     Marland Kitchen  lisinopril-hydrochlorothiazide (PRINZIDE,ZESTORETIC) 10-12.5 MG per tablet  Take 1 tablet by mouth daily.     Marland Kitchen  lithium carbonate (LITHOBID) 300 MG CR tablet  Take one tablet in the morning and one tablet at bedtime,  180 tablet  0   .  Multiple Vitamin (MULTIVITAMIN) capsule  Take 1 capsule by mouth daily.     Marland Kitchen  PARoxetine (PAXIL) 30 MG tablet  Take 0.5 tablets (15 mg total) by mouth every morning.  45 tablet  1   .  PROAIR HFA 108 (90 BASE) MCG/ACT inhaler      .  vitamin B-12 (CYANOCOBALAMIN) 1000 MCG tablet  Take by mouth 5 (five) times daily.      Previous Psychotropic Medications: Reviewed  Medication   Bupropion   Paxil   Lithium   Lamictal-possible rash   Depakote-"spacey"   Prozac-choking sensation   Geodon-didn't work.   Aripiprazole-didn't want to take.    Substance Abuse History in the last 12 months:  Caffeine-8 ounce  Alcohol: Patient denies.  Tobacco: Patient denies.  Illicit Drugs: Patient denies.  Blackouts: No   Family History: Reviewed  Family History   Problem  Relation  Age of Onset   .  Adopted: Yes   .  Aneurysm  Father     Mental Status Examination/Evaluation:  Objective: Appearance: Well Groomed   Eye Contact:: Good   Speech: Clear and Coherent and Normal Rate   Volume: Normal   Mood: "not bad"   Affect: Appropriate, Congruent and Restricted   Thought Process: Goal Directed, Logical and Loose   Orientation: Full   Thought Content: WEDL   Suicidal Thoughts: Patient denies   Homicidal Thoughts: Patient denies   Judgement: Fair   Insight: Fair   Psychomotor Activity: Normal   Akathisia: No   Handed: Right   Memory: Immediate 3/3, Recent: 2/3   AIMS (if indicated): Not indicated at this time.   Assets: Communication Skills  Desire for Improvement     Assessment:  AXIS I  Major Depressive Disorder, severe, recurrent, and Social Phobia, rule out Bipolar  Disorder, NOS   AXIS II  No diagnosis    AXIS III  .  Hypertension     .  Asthma     .  Fibroids     AXIS IV  other psychosocial or environmental problems and problems with primary support group   AXIS V  GAF: 48    Treatment Plan/Recommendations:  1. Affirm with the patient that the medications are taken as ordered. Patient expressed understanding of how their medications were to be used.  2. Continue the following psychiatric medications as written prior to this appointment with the following changes:  a) Decrease Clonazepam 0.25 mg Qday, patient asked to take no tablet if possible. This will be her last prescription. b) Buproprion 200 mg BID  c) Paroxetine -10 mg- Will continue taper as follows- Take one tablet for 7 days, then one half tablet for 7 days, then one tablet every other day for 7 days, then stop. d) Will decrease Lithium Carbonate 300 mg to one capsule in the morning and one capsules at  bedtime, due to tremors at increased dosage. e) patient reports the current combination of medications has brought about the most stability.  3. Therapy: brief supportive therapy provided. Discussed psychosocial stressors. Continue current services. Continue Individual therapy.  4. Risks and benefits, side effects and alternatives discussed with patient, she was given an opportunity to ask questions about her medication, illness, and treatment. All current psychiatric medications have been reviewed and discussed with the patient and adjusted as clinically appropriate. The patient has been provided an accurate and updated list of the medications being now prescribed.  5. Patient told to call clinic if any problems occur. Patient advised to go to ER if she should develop SI/HI, side effects, or if symptoms worsen. Has crisis numbers to call if needed.  6. Will repeat BUN/Creatinine and Lithium level. 7. The patient was encouraged to keep all PCP and specialty clinic appointments.  8. Patient was  instructed to return to clinic in 1 month.  9. The patient expressed understanding of the plan and agrees with the above.  Jacqulyn Cane, MD

## 2012-05-17 LAB — LITHIUM LEVEL: Lithium Lvl: 0.6 mEq/L — ABNORMAL LOW (ref 0.80–1.40)

## 2012-05-18 DIAGNOSIS — M5430 Sciatica, unspecified side: Secondary | ICD-10-CM | POA: Diagnosis not present

## 2012-05-18 DIAGNOSIS — Z9889 Other specified postprocedural states: Secondary | ICD-10-CM | POA: Diagnosis not present

## 2012-05-18 DIAGNOSIS — J45909 Unspecified asthma, uncomplicated: Secondary | ICD-10-CM | POA: Diagnosis not present

## 2012-05-18 DIAGNOSIS — F329 Major depressive disorder, single episode, unspecified: Secondary | ICD-10-CM | POA: Diagnosis not present

## 2012-05-18 DIAGNOSIS — I10 Essential (primary) hypertension: Secondary | ICD-10-CM | POA: Diagnosis not present

## 2012-05-18 DIAGNOSIS — Z79899 Other long term (current) drug therapy: Secondary | ICD-10-CM | POA: Diagnosis not present

## 2012-05-18 DIAGNOSIS — F411 Generalized anxiety disorder: Secondary | ICD-10-CM | POA: Diagnosis not present

## 2012-05-21 ENCOUNTER — Ambulatory Visit (HOSPITAL_COMMUNITY): Payer: Self-pay | Admitting: Licensed Clinical Social Worker

## 2012-05-23 ENCOUNTER — Encounter: Payer: Self-pay | Admitting: Obstetrics & Gynecology

## 2012-05-23 ENCOUNTER — Ambulatory Visit (INDEPENDENT_AMBULATORY_CARE_PROVIDER_SITE_OTHER): Payer: Medicare Other | Admitting: Obstetrics & Gynecology

## 2012-05-23 VITALS — BP 125/77 | HR 100 | Temp 98.0°F | Resp 17 | Ht 64.0 in | Wt 261.0 lb

## 2012-05-23 DIAGNOSIS — D259 Leiomyoma of uterus, unspecified: Secondary | ICD-10-CM

## 2012-05-23 DIAGNOSIS — D219 Benign neoplasm of connective and other soft tissue, unspecified: Secondary | ICD-10-CM

## 2012-05-23 NOTE — Progress Notes (Signed)
  Subjective:    Patient ID: Julie Powell, female    DOB: [birth year], 51 y.o.   MRN: [id number]  HPI  Pt presents for follow up from recommendations of GI MD.  Pt had epigastric pain and had EGD.  Pt also had upper abd [place] which found a renal abnormalitiy.  The Rena CT showed fibroids and she was told to come back to gyn.  Pt not having any new complaints with known large fiboids.  No new pain or bleeding.  Pt has skipped a few periods, but is also having hot flashes and night sweats consistent with menopausal symptoms.  Pt get moliminal symptoms with her menses.  Review of Systems  Constitutional:       Hot flashes  Respiratory: Negative.   Cardiovascular: Negative.   Gastrointestinal:       Upper abdominal pain  Genitourinary: Negative for vaginal bleeding, vaginal discharge, vaginal pain, menstrual problem and pelvic pain.  Musculoskeletal: Positive for back pain and gait problem.       Objective:   Physical Exam  Vitals reviewed. Constitutional: She is oriented to person, place, and time. She appears well-developed and well-nourished. No distress.  HENT:  Head: Normocephalic and atraumatic.  Eyes: Conjunctivae are normal.  Pulmonary/Chest: Effort normal.  Abdominal: Soft. She exhibits no distension. There is no tenderness. There is no rebound and no guarding.       I can't appreciate uterine boundaries with abdominal exam; however, pt is obese and it is difficult to palpate through pannus.  Neurological: She is alert and oriented to person, place, and time.  Skin: Skin is warm and dry.  Psychiatric: She has a normal mood and affect.          Assessment & Plan:  51 year old female with known large fibroids.  No new symptoms.  Perimenopausal.  1-  Rpt [place] to evaluate size. 2-  Needs annual exam.  No pap needed for 2 more years. 3-  RTC one month after [place]

## 2012-05-24 ENCOUNTER — Other Ambulatory Visit (HOSPITAL_COMMUNITY): Payer: Self-pay | Admitting: Psychiatry

## 2012-05-24 DIAGNOSIS — M47817 Spondylosis without myelopathy or radiculopathy, lumbosacral region: Secondary | ICD-10-CM | POA: Diagnosis not present

## 2012-05-24 DIAGNOSIS — Z09 Encounter for follow-up examination after completed treatment for conditions other than malignant neoplasm: Secondary | ICD-10-CM | POA: Diagnosis not present

## 2012-05-24 DIAGNOSIS — M549 Dorsalgia, unspecified: Secondary | ICD-10-CM | POA: Diagnosis not present

## 2012-05-28 ENCOUNTER — Ambulatory Visit (INDEPENDENT_AMBULATORY_CARE_PROVIDER_SITE_OTHER): Payer: Medicare Other

## 2012-05-28 DIAGNOSIS — D219 Benign neoplasm of connective and other soft tissue, unspecified: Secondary | ICD-10-CM

## 2012-05-28 DIAGNOSIS — D259 Leiomyoma of uterus, unspecified: Secondary | ICD-10-CM

## 2012-05-29 ENCOUNTER — Ambulatory Visit (INDEPENDENT_AMBULATORY_CARE_PROVIDER_SITE_OTHER): Payer: Medicare Other | Admitting: Obstetrics & Gynecology

## 2012-05-29 ENCOUNTER — Encounter: Payer: Self-pay | Admitting: Obstetrics & Gynecology

## 2012-05-29 VITALS — BP 111/77 | HR 99 | Temp 98.6°F | Resp 16 | Ht 64.0 in | Wt 261.0 lb

## 2012-05-29 DIAGNOSIS — D259 Leiomyoma of uterus, unspecified: Secondary | ICD-10-CM

## 2012-05-29 NOTE — Progress Notes (Signed)
  Subjective:    Patient ID: Julie Powell, female    DOB: [birth year], 51 y.o.   MRN: [id number]  HPI  Pt presents for results of [place].  Pt not having any gyn complaints  Review of Systems  Respiratory: Negative.   Cardiovascular: Negative.   Gastrointestinal:       Upper abdominal discomfort  Genitourinary: Negative.   Musculoskeletal: Positive for back pain.       Right leg numbness       Objective:   Physical Exam  Vitals reviewed. Constitutional: She appears well-developed and well-nourished.  HENT:  Head: Normocephalic and atraumatic.  Eyes: Conjunctivae are normal.  Pulmonary/Chest: Effort normal.  Neurological: She is alert.  Skin: Skin is warm and dry.  Psychiatric: She has a normal mood and affect.     .     Assessment & Plan:   TRANSABDOMINAL AND TRANSVAGINAL ULTRASOUND OF PELVIS  Technique: Both transabdominal and transvaginal ultrasound  examinations of the pelvis were performed. Transabdominal technique  was performed for global imaging of the pelvis including uterus,  ovaries, adnexal regions, and pelvic cul-de-sac.  It was necessary to proceed with endovaginal exam following the  transabdominal exam to visualize the endometrium and adnexa.  Comparison: MRI dated May 28, 2011 and ultrasound dated May 25, 2011  Findings:  The uterus is enlarged and lobular in contour, measuring 12.4 x 7.8  x 6.6 cm (previously 11.8 x 12.5 x 8.0 cm.) Multiple fibroids are  again identified. The largest is posterior broad-based subserosal  (7.3 cm- previously 8.0 cm), right anterior fundal myometrial (5.8  cm- previously 6.6 cm), and right pedunculated (5.5 cm- previously  5.7 cm.) The endometrial stripe is only seen in the lower uterine  segment but does not appear thickened, measuring 8.7 mm. Neither  ovary is identified. There is no free pelvic fluid.  IMPRESSION:  There has been a slight overall decrease in the size of the  uterus, as well as the largest  uterine fibroids, as described  above.  Original Report Authenticated By: Brandon Melnick, M.D.      Fibroids have decreased in size.  No further action at this time. Needs Mammogram in November.

## 2012-05-31 ENCOUNTER — Telehealth (HOSPITAL_COMMUNITY): Payer: Self-pay | Admitting: Psychiatry

## 2012-05-31 NOTE — Telephone Encounter (Signed)
Called patient, I informed her I would not continue clonazepam.

## 2012-06-04 DIAGNOSIS — Z09 Encounter for follow-up examination after completed treatment for conditions other than malignant neoplasm: Secondary | ICD-10-CM | POA: Diagnosis not present

## 2012-06-07 ENCOUNTER — Telehealth (HOSPITAL_COMMUNITY): Payer: Self-pay | Admitting: Psychiatry

## 2012-06-07 DIAGNOSIS — F319 Bipolar disorder, unspecified: Secondary | ICD-10-CM

## 2012-06-07 MED ORDER — BUPROPION HCL 100 MG PO TABS
200.0000 mg | ORAL_TABLET | Freq: Two times a day (BID) | ORAL | Status: DC
Start: 2012-06-07 — End: 2012-06-13

## 2012-06-07 NOTE — Telephone Encounter (Signed)
Called patient, advised her her last prescription of clonazepam was on 05/18/2012. Patient denies SI/HI/AVH or medication side effects. Will fill bupropion for 90 days.

## 2012-06-13 ENCOUNTER — Encounter (HOSPITAL_COMMUNITY): Payer: Self-pay | Admitting: Psychiatry

## 2012-06-13 ENCOUNTER — Ambulatory Visit (INDEPENDENT_AMBULATORY_CARE_PROVIDER_SITE_OTHER): Payer: Medicare Other | Admitting: Psychiatry

## 2012-06-13 VITALS — BP 101/70 | HR 95 | Ht 64.0 in | Wt 264.0 lb

## 2012-06-13 DIAGNOSIS — F319 Bipolar disorder, unspecified: Secondary | ICD-10-CM

## 2012-06-13 DIAGNOSIS — F4010 Social phobia, unspecified: Secondary | ICD-10-CM | POA: Diagnosis not present

## 2012-06-13 DIAGNOSIS — F332 Major depressive disorder, recurrent severe without psychotic features: Secondary | ICD-10-CM | POA: Diagnosis not present

## 2012-06-13 MED ORDER — BUPROPION HCL 100 MG PO TABS
200.0000 mg | ORAL_TABLET | Freq: Two times a day (BID) | ORAL | Status: DC
Start: 2012-06-13 — End: 2012-08-20

## 2012-06-13 MED ORDER — LITHIUM CARBONATE ER 300 MG PO TBCR: 600.0000 mg | EXTENDED_RELEASE_TABLET | Freq: Two times a day (BID) | ORAL | Status: DC

## 2012-06-13 NOTE — Progress Notes (Signed)
[facility] Behavioral Health Follow-up Outpatient Visit  Theia Dezeeuw 1961/09/06  Date: 06/13/2012 Chief Complaint   Patient presents with   .  Follow-up    HPI Comments: Ms. Skarda is a 51 y/o woman with a past psychiatric history Major Depressive Disorder, severe, recurrent, and Social Phobia. The patient reports she tolerated her decreased dosage of Paxil from 15 mg to 10 mg without any side effect or return of depression. The patient states that she continues to haves some concerns about her son with marijuana abuse. She decreased her dosage of lithium to 300 mg BID. The patient denies any further passive suicidal ideation without plans, or intent. The patient reports that she has episodes of impulsive buying and will return the items she buys. She has done this with phones.  In the area of affective symptoms, patient appears mildly anxious. Patient denies current suicidal ideation, intent, or plan. Patient denies current homicidal ideation, intent, or plan. Patient denies auditory hallucinations. Patient denies visual hallucinations. Patient denies symptoms of paranoia. Patient states sleep is improved with 10 hours. Appetite is increased. Energy level is fair. Patient endorses no improvement in symptoms of anhedonia.  She continues to isolate herself from friends. She states she continues to experience some irritability. Patient denies hopelessness, helplessness, or guilt.   Denies any recent episodes consistent with mania, particularly decreased need for sleep with increased energy, grandiosity, impulsivity, hyperverbal and pressured speech, or increased productivity. The patient denies symptoms consistent with psychosis particularly auditory or visual hallucinations, thought broadcasting/insertion/withdrawal, or ideas of reference. The patient endorses excessive worry but will remove herself from situations prior to getting to experiencing physical symptoms or panic attacks. She reports a history  of trauma and symptoms consistent with PTSD including hypervigilance, feelings of numbness or inability to connect with others, and she endorses avoidance of friends and family. She endorse avoidance of social interaction as a means of coping with anxiety.   Traumatic Brain Injury: No none   Review of Systems:  Constitutional: Negative for unexpected weight change.  Respiratory: Negative for chest tightness and shortness of breath.  Cardiovascular: Positive for tremors. Negative for chest pain and palpitations.  Neurological: Negative for dizziness, seizures, syncope and headaches.  Psychiatric/Behavioral: Negative for suicidal ideas, hallucinations, behavioral problems, confusion and agitation.   Filed Vitals:   06/13/12 1117  BP: 101/70  Pulse: 95  Height: 5\' 4"  (1.626 m)  Weight: 264 lb (119.75 kg)   Physical Exam  Constitutional: She appears well-developed and well-nourished. No distress.  Skin: She is not diaphoretic.  No tremors of hands noted while patient was interviewed.   Past Psychiatric History: Reviewed  Diagnosis: Major Depressive Disorder, recurrent, severe   Hospitalizations: Several since age 48   Outpatient Care: Since   Substance Abuse Care: none   Self-Mutilation: Patient denies   Suicidal Attempts: Three attempts   Violent Behaviors: Patient denies.    Past Medical History: Reviewed  Past Medical History   Diagnosis  Date   .  Hypertension    .  Bipolar 1 disorder  2010     ECT   .  Anxiety    .  Asthma    .  Fibroids     History of Loss of Consciousness: No  Seizure History: No  Cardiac History: Yes-HTN  Allergies: No Known Allergies   Current Medications: Reviewed  Current Outpatient Prescriptions on File Prior to Visit  Medication Sig Dispense Refill  . albuterol (PROVENTIL HFA;VENTOLIN HFA) 108 (90 BASE) MCG/ACT inhaler  Inhale 2 puffs into the lungs every 6 (six) hours as needed.      . Cyanocobalamin (VITAMIN B-12) 5000 MCG SUBL Place 5,000  mcg under the tongue daily.      . cyclobenzaprine (FLEXERIL) 10 MG tablet       . Flaxseed, Linseed, (FLAX SEED OIL PO) Take by mouth.      Marland Kitchen HYDROcodone-acetaminophen (VICODIN) 5-500 MG per tablet       . lisinopril-hydrochlorothiazide (PRINZIDE,ZESTORETIC) 10-12.5 MG per tablet Take 10-12.5 mg by mouth Daily.       Bupropion SR 100 mg Two capsule BID     Lithium carbonate 300 mg Take one capsule BID    . Multiple Vitamin (MULTIVITAMIN) tablet Take 1 tablet by mouth daily.      Marland Kitchen PARoxetine (PAXIL) 10 MG tablet Take one tablet for 7 days, then one half tablet for 7 days, then one tablet every other day for 7 days, then stop.  30 tablet  0     Previous Psychotropic Medications: Reviewed  Medication   Bupropion   Paxil   Lithium   Lamictal-possible rash   Depakote-"spacey"   Prozac-choking sensation   Geodon-didn't work.   Aripiprazole-didn't want to take.    Substance Abuse History in the last 12 months:  Caffeine-8 ounce  Alcohol: Patient denies.  Tobacco: Patient denies.  Illicit Drugs: Patient denies.  Blackouts: No   Family History: Reviewed  Family History   Problem  Relation  Age of Onset   .  Adopted: Yes   .  Aneurysm  Father     Mental Status Examination/Evaluation:  Objective: Appearance: Well Groomed   Eye Contact:: Good   Speech: Clear and Coherent and Normal Rate   Volume: Normal   Mood: "kind of anxious"   Affect: Appropriate, Congruent and Restricted   Thought Process: Goal Directed, Logical and Loose   Orientation: Full   Thought Content: WEDL   Suicidal Thoughts: Patient denies   Homicidal Thoughts: Patient denies   Judgement: Fair   Insight: Fair   Psychomotor Activity: Normal   Akathisia: No   Handed: Right   Memory: Immediate 3/3, Recent: 3/3   AIMS (if indicated): Not indicated at this time.   Assets: Communication Skills  Desire for Improvement    Assessment:  AXIS I  Major Depressive Disorder, severe, recurrent, and Social Phobia,  rule out Bipolar Disorder, NOS   AXIS II  No diagnosis    AXIS III  .  Hypertension     .  Asthma     .  Fibroids     AXIS IV  other psychosocial or environmental problems and problems with primary support group   AXIS V  GAF: 48    Treatment Plan/Recommendations:  1. Affirm with the patient that the medications are taken as ordered. Patient expressed understanding of how their medications were to be used.  2. Continue the following psychiatric medications as written prior to this appointment with the following changes:  a) Discontinue Clonazepam 0.25 mg Qday. b) Buproprion 200 mg BID  c) Paroxetine -10 mg- continue taper as follows- 10 mg-Take one tablet for 7 days, then one half tablet for 7 days, then one tablet every other day for 7 days, then stop. d) Will continue Lithium Carbonate 300 mg to one capsule in the morning and one capsules at bedtime, due to tremors at increased dosage.  e) patient reports the current combination of medications has brought about the most stability.  3. Therapy:  brief supportive therapy provided. Discussed psychosocial stressors. Advised spending 10 minutes daily making changes to 4 important areas of her life. 4. Risks and benefits, side effects and alternatives discussed with patient, she was given an opportunity to ask questions about her medication, illness, and treatment. All current psychiatric medications have been reviewed and discussed with the patient and adjusted as clinically appropriate. The patient has been provided an accurate and updated list of the medications being now prescribed.  5. Patient told to call clinic if any problems occur. Patient advised to go to ER if she should develop SI/HI, side effects, or if symptoms worsen. Has crisis numbers to call if needed.  6. Will repeat BUN/Creatinine and Lithium level after her next visit.  7. The patient was encouraged to keep all PCP and specialty clinic appointments.  8. Patient was instructed  to return to clinic in 2 months.  9. The patient expressed understanding of the plan and agrees with the above.   Jacqulyn Cane, MD

## 2012-06-18 DIAGNOSIS — IMO0002 Reserved for concepts with insufficient information to code with codable children: Secondary | ICD-10-CM | POA: Diagnosis not present

## 2012-06-21 ENCOUNTER — Telehealth (HOSPITAL_COMMUNITY): Payer: Self-pay

## 2012-06-21 NOTE — Telephone Encounter (Signed)
Called patient will fill her forms for disability. Will put them in her mail box,

## 2012-07-10 DIAGNOSIS — J019 Acute sinusitis, unspecified: Secondary | ICD-10-CM | POA: Diagnosis not present

## 2012-07-10 DIAGNOSIS — H6120 Impacted cerumen, unspecified ear: Secondary | ICD-10-CM | POA: Diagnosis not present

## 2012-07-10 DIAGNOSIS — R42 Dizziness and giddiness: Secondary | ICD-10-CM | POA: Diagnosis not present

## 2012-07-16 ENCOUNTER — Encounter (HOSPITAL_COMMUNITY): Payer: Self-pay | Admitting: Psychiatry

## 2012-07-16 ENCOUNTER — Ambulatory Visit (INDEPENDENT_AMBULATORY_CARE_PROVIDER_SITE_OTHER): Payer: Medicare Other | Admitting: Psychiatry

## 2012-07-16 VITALS — BP 111/76 | HR 75 | Ht 64.0 in | Wt 268.0 lb

## 2012-07-16 DIAGNOSIS — F4010 Social phobia, unspecified: Secondary | ICD-10-CM | POA: Diagnosis not present

## 2012-07-16 DIAGNOSIS — F332 Major depressive disorder, recurrent severe without psychotic features: Secondary | ICD-10-CM

## 2012-07-16 MED ORDER — LITHIUM CARBONATE ER 300 MG PO TBCR: 300.0000 mg | EXTENDED_RELEASE_TABLET | Freq: Two times a day (BID) | ORAL | Status: DC

## 2012-07-16 NOTE — Progress Notes (Signed)
[facility] Follow-up Outpatient Visit  Julie Powell 1961-11-05  Date:  07/16/2012  Chief Complaint   Patient presents with   .  Follow-up    HPI Comments: Julie Powell is a 51 y/o woman with a past psychiatric history Major Depressive Disorder, severe, recurrent, and Social Phobia.   In the area of affective symptoms, patient appears mildly anxious. Patient denies current suicidal ideation, intent, or plan. Patient denies current homicidal ideation, intent, or plan. Patient denies auditory hallucinations. Patient denies visual hallucinations. Patient denies symptoms of paranoia. Patient states sleep is improved with 10 hours. Appetite is increased. Energy level is fair. Patient endorses no improvement in symptoms of anhedonia. She continues to isolate herself from friends. She states she continues to experience some irritability. Patient denies hopelessness, helplessness, or guilt.   Denies any recent episodes consistent with mania, particularly decreased need for sleep with increased energy, grandiosity, impulsivity, hyperverbal and pressured speech, or increased productivity. The patient denies symptoms consistent with psychosis particularly auditory or visual hallucinations, thought broadcasting/insertion/withdrawal, or ideas of reference. The patient endorses excessive worry but will remove herself from situations prior to getting to experiencing physical symptoms or panic attacks. She reports a history of trauma and symptoms consistent with PTSD including hypervigilance, feelings of numbness or inability to connect with others, and she endorses avoidance of friends and family, though she has been able to contact. She endorse avoidance of social interaction as a means of coping with anxiety.   Traumatic Brain Injury: No none  Review of Systems:  Constitutional: Negative for unexpected weight change.  Respiratory: Negative for chest tightness and shortness of breath.    Cardiovascular: Positive for tremors. Negative for chest pain and palpitations.  Neurological: Negative for dizziness, seizures, syncope and headaches.  Psychiatric/Behavioral: Negative for suicidal ideas, hallucinations, behavioral problems, confusion and agitation.   Filed Vitals:   07/16/12 1112  BP: 111/76  Pulse: 75  Height: 5\' 4"  (1.626 m)  Weight: 268 lb (121.564 kg)   Physical Exam  Constitutional: She appears well-developed and well-nourished. No distress.  Skin: She is not diaphoretic.  No tremors of hands noted while patient was interviewed.   Past Psychiatric History: Reviewed  Diagnosis: Major Depressive Disorder, recurrent, severe   Hospitalizations: Several since age 73   Outpatient Care: Since   Substance Abuse Care: none   Self-Mutilation: Patient denies   Suicidal Attempts: Three attempts   Violent Behaviors: Patient denies.    Past Medical History: Reviewed  Past Medical History   Diagnosis  Date   .  Hypertension    .  Bipolar 1 disorder  2010     ECT   .  Anxiety    .  Asthma    .  Fibroids     History of Loss of Consciousness: No  Seizure History: No  Cardiac History: Yes-HTN   Allergies: No Known Allergies   Current Medications: Reviewed Current Outpatient Prescriptions on File Prior to Visit  Medication Sig Dispense Refill  . albuterol (PROVENTIL HFA;VENTOLIN HFA) 108 (90 BASE) MCG/ACT inhaler Inhale 2 puffs into the lungs every 6 (six) hours as needed.      . Cyanocobalamin (VITAMIN B-12) 5000 MCG SUBL Place 5,000 mcg under the tongue daily.      . Flaxseed, Linseed, (FLAX SEED OIL PO) Take by mouth.      Marland Kitchen HYDROcodone-acetaminophen (VICODIN) 5-500 MG per tablet       . lisinopril-hydrochlorothiazide (PRINZIDE,ZESTORETIC) 10-12.5 MG per tablet Take 10-12.5 mg by mouth  Daily.      . Multiple Vitamin (MULTIVITAMIN) tablet Take 1 tablet by mouth daily.      Marland Kitchen buPROPion (WELLBUTRIN) 100 MG tablet Take 2 tablets (200 mg total) by mouth 2 (two)  times daily.  360 tablet  1    Previous Psychotropic Medications: Reviewed  Medication   Bupropion   Paxil   Lithium   Lamictal-possible rash   Depakote-"spacey"   Prozac-choking sensation   Geodon-didn't work.   Aripiprazole-didn't want to take.    Substance Abuse History in the last 12 months:  Caffeine-8 ounce  Alcohol: Patient denies.  Tobacco: Patient denies.  Illicit Drugs: Patient denies.  Blackouts: No    Family History: Reviewed  Family History   Problem  Relation  Age of Onset   .  Adopted: Yes   .  Aneurysm  Father     Mental Status Examination/Evaluation:  Objective: Appearance: Well Groomed   Eye Contact:: Good   Speech: Clear and Coherent and Normal Rate   Volume: Normal   Mood: "really sad"   Affect: Appropriate, Congruent and Restricted   Thought Process: Goal Directed, Logical and Loose   Orientation: Full   Thought Content: WEDL   Suicidal Thoughts: Patient denies   Homicidal Thoughts: Patient denies   Judgement: Fair   Insight: Fair   Psychomotor Activity: Normal   Akathisia: No   Handed: Right   Memory: Immediate 3/3, Recent: 3/3   AIMS (if indicated): Not indicated at this time.   Assets: Communication Skills  Desire for Improvement    Assessment:  AXIS I: Major Depressive Disorder, severe, recurrent, and Social Phobia, rule out Bipolar Disorder, NOS  AXIS II: No diagnosis  AXIS III: Hypertension, Asthma, Fibroids AXIS IV: other psychosocial or environmental problems and problems with primary support group  AXIS V: GAF: 48   Treatment Plan/Recommendations:  1. Affirm with the patient that the medications are taken as ordered. Patient expressed understanding of how their medications were to be used.  2. Continue the following psychiatric medications as written prior to this appointment with the following changes:  a) Will continue Lithium Carbonate 300 mg to one capsule in the morning and one capsules at bedtime, due to tremors at  increased dosage.  b) Buproprion 200 mg BID  c) Paroxetine-10 mg-one tablet daily for 7 days, then one tablet every other day for 7 days then stop. d) patient reports the current combination of medications has brought about the most stability.  3. Therapy: brief supportive therapy provided. Discussed psychosocial stressors. Advised spending 10 minutes daily making changes to 4 important areas of her life.  4. Risks and benefits, side effects and alternatives discussed with patient, she was given an opportunity to ask questions about her medication, illness, and treatment. All current psychiatric medications have been reviewed and discussed with the patient and adjusted as clinically appropriate. The patient has been provided an accurate and updated list of the medications being now prescribed.  5. Patient told to call clinic if any problems occur. Patient advised to go to ER if she should develop SI/HI, side effects, or if symptoms worsen. Has crisis numbers to call if needed.  6. Will repeat BUN/Creatinine and Lithium level after her next visit.  7. The patient was encouraged to keep all PCP and specialty clinic appointments.  8. Patient was instructed to return to clinic in 2 months.  9. The patient expressed understanding of the plan and agrees with the above.   Jacqulyn Cane, MD

## 2012-08-01 ENCOUNTER — Telehealth (HOSPITAL_COMMUNITY): Payer: Self-pay | Admitting: Psychiatry

## 2012-08-01 NOTE — Telephone Encounter (Signed)
Called patient. Informed her she needed to get her labs done. No reported SI/HI/AVH or medication side effects.  She stated she would do her labs the 1st week of October.

## 2012-08-14 DIAGNOSIS — F332 Major depressive disorder, recurrent severe without psychotic features: Secondary | ICD-10-CM | POA: Diagnosis not present

## 2012-08-15 LAB — CREATININE, SERUM: Creat: 0.98 mg/dL (ref 0.50–1.10)

## 2012-08-15 LAB — LITHIUM LEVEL: Lithium Lvl: 0.4 mEq/L — ABNORMAL LOW (ref 0.80–1.40)

## 2012-08-15 LAB — BUN: BUN: 14 mg/dL (ref 6–23)

## 2012-08-20 ENCOUNTER — Encounter (HOSPITAL_COMMUNITY): Payer: Self-pay | Admitting: Psychiatry

## 2012-08-20 ENCOUNTER — Ambulatory Visit (INDEPENDENT_AMBULATORY_CARE_PROVIDER_SITE_OTHER): Payer: Medicare Other | Admitting: Psychiatry

## 2012-08-20 VITALS — BP 108/68 | HR 95 | Ht 64.0 in | Wt 260.0 lb

## 2012-08-20 DIAGNOSIS — F4010 Social phobia, unspecified: Secondary | ICD-10-CM

## 2012-08-20 DIAGNOSIS — F332 Major depressive disorder, recurrent severe without psychotic features: Secondary | ICD-10-CM

## 2012-08-20 DIAGNOSIS — F319 Bipolar disorder, unspecified: Secondary | ICD-10-CM

## 2012-08-20 MED ORDER — PAROXETINE HCL 10 MG PO TABS: 10.0000 mg | ORAL_TABLET | ORAL | Status: DC

## 2012-08-20 MED ORDER — BUPROPION HCL 100 MG PO TABS: 200.0000 mg | ORAL_TABLET | Freq: Two times a day (BID) | ORAL | Status: DC
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
## 2012-08-29 DIAGNOSIS — J45909 Unspecified asthma, uncomplicated: Secondary | ICD-10-CM | POA: Diagnosis not present

## 2012-08-29 DIAGNOSIS — Z23 Encounter for immunization: Secondary | ICD-10-CM | POA: Diagnosis not present

## 2012-08-29 DIAGNOSIS — M549 Dorsalgia, unspecified: Secondary | ICD-10-CM | POA: Diagnosis not present

## 2012-08-29 DIAGNOSIS — I10 Essential (primary) hypertension: Secondary | ICD-10-CM | POA: Diagnosis not present

## 2012-08-29 DIAGNOSIS — Z6841 Body Mass Index (BMI) 40.0 and over, adult: Secondary | ICD-10-CM | POA: Diagnosis not present

## 2012-08-29 DIAGNOSIS — M5430 Sciatica, unspecified side: Secondary | ICD-10-CM | POA: Diagnosis not present

## 2012-08-29 DIAGNOSIS — R632 Polyphagia: Secondary | ICD-10-CM | POA: Diagnosis not present

## 2012-09-20 ENCOUNTER — Encounter (HOSPITAL_COMMUNITY): Payer: Self-pay | Admitting: Psychiatry

## 2012-09-20 ENCOUNTER — Ambulatory Visit (INDEPENDENT_AMBULATORY_CARE_PROVIDER_SITE_OTHER): Payer: Medicare Other | Admitting: Psychiatry

## 2012-09-20 VITALS — BP 121/76 | HR 100 | Ht 64.0 in | Wt 265.0 lb

## 2012-09-20 DIAGNOSIS — F332 Major depressive disorder, recurrent severe without psychotic features: Secondary | ICD-10-CM

## 2012-09-20 DIAGNOSIS — F4010 Social phobia, unspecified: Secondary | ICD-10-CM

## 2012-09-20 MED ORDER — LITHIUM CARBONATE ER 300 MG PO TBCR: 300.0000 mg | EXTENDED_RELEASE_TABLET | Freq: Two times a day (BID) | ORAL | Status: DC

## 2012-09-20 MED ORDER — PAROXETINE HCL 30 MG PO TABS: 15.0000 mg | ORAL_TABLET | ORAL | Status: DC
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
## 2012-09-22 MED ORDER — PAROXETINE HCL 30 MG PO TABS: 15.0000 mg | ORAL_TABLET | ORAL | Status: DC

## 2012-09-26 DIAGNOSIS — R632 Polyphagia: Secondary | ICD-10-CM | POA: Diagnosis not present

## 2012-09-26 DIAGNOSIS — Z6841 Body Mass Index (BMI) 40.0 and over, adult: Secondary | ICD-10-CM | POA: Diagnosis not present

## 2012-09-26 DIAGNOSIS — Z79899 Other long term (current) drug therapy: Secondary | ICD-10-CM | POA: Diagnosis not present

## 2012-09-26 DIAGNOSIS — H60399 Other infective otitis externa, unspecified ear: Secondary | ICD-10-CM | POA: Diagnosis not present

## 2012-10-25 DIAGNOSIS — R632 Polyphagia: Secondary | ICD-10-CM | POA: Diagnosis not present

## 2012-10-25 DIAGNOSIS — J45909 Unspecified asthma, uncomplicated: Secondary | ICD-10-CM | POA: Diagnosis not present

## 2012-10-25 DIAGNOSIS — Z6841 Body Mass Index (BMI) 40.0 and over, adult: Secondary | ICD-10-CM | POA: Diagnosis not present

## 2012-10-25 DIAGNOSIS — R0609 Other forms of dyspnea: Secondary | ICD-10-CM | POA: Diagnosis not present

## 2012-10-25 DIAGNOSIS — I10 Essential (primary) hypertension: Secondary | ICD-10-CM | POA: Diagnosis not present

## 2012-11-06 LAB — HM MAMMOGRAPHY: HM Mammogram: NORMAL

## 2012-11-06 LAB — HM PAP SMEAR: HM Pap smear: NORMAL

## 2012-11-20 ENCOUNTER — Ambulatory Visit (INDEPENDENT_AMBULATORY_CARE_PROVIDER_SITE_OTHER): Payer: Medicare Other | Admitting: Psychiatry

## 2012-11-20 ENCOUNTER — Telehealth (HOSPITAL_COMMUNITY): Payer: Self-pay

## 2012-11-20 ENCOUNTER — Encounter (HOSPITAL_COMMUNITY): Payer: Self-pay | Admitting: Psychiatry

## 2012-11-20 VITALS — BP 114/86 | HR 86 | Ht 64.0 in | Wt 256.0 lb

## 2012-11-20 DIAGNOSIS — F332 Major depressive disorder, recurrent severe without psychotic features: Secondary | ICD-10-CM | POA: Diagnosis not present

## 2012-11-20 DIAGNOSIS — F319 Bipolar disorder, unspecified: Secondary | ICD-10-CM

## 2012-11-20 DIAGNOSIS — F4010 Social phobia, unspecified: Secondary | ICD-10-CM

## 2012-11-20 LAB — CREATININE, SERUM: Creat: 0.99 mg/dL (ref 0.50–1.10)

## 2012-11-20 LAB — BUN: BUN: 15 mg/dL (ref 6–23)

## 2012-11-20 LAB — TSH: TSH: 2.956 u[IU]/mL (ref 0.350–4.500)

## 2012-11-20 MED ORDER — LITHIUM CARBONATE ER 300 MG PO TBCR: 300.0000 mg | EXTENDED_RELEASE_TABLET | Freq: Two times a day (BID) | ORAL | Status: DC

## 2012-11-20 MED ORDER — BUPROPION HCL 100 MG PO TABS: 200.0000 mg | ORAL_TABLET | Freq: Two times a day (BID) | ORAL | Status: DC

## 2012-11-20 MED ORDER — PAROXETINE HCL 30 MG PO TABS: 15.0000 mg | ORAL_TABLET | ORAL | Status: DC
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
## 2012-11-20 NOTE — Telephone Encounter (Signed)
Acknowledged.

## 2012-11-20 NOTE — Telephone Encounter (Signed)
Can you please send the rx to [facility] [address]

## 2012-11-21 LAB — LITHIUM LEVEL: Lithium Lvl: 0.6 mEq/L — ABNORMAL LOW (ref 0.80–1.40)

## 2012-11-29 DIAGNOSIS — I10 Essential (primary) hypertension: Secondary | ICD-10-CM | POA: Diagnosis not present

## 2012-11-29 DIAGNOSIS — Z79899 Other long term (current) drug therapy: Secondary | ICD-10-CM | POA: Diagnosis not present

## 2012-11-29 DIAGNOSIS — Z6841 Body Mass Index (BMI) 40.0 and over, adult: Secondary | ICD-10-CM | POA: Diagnosis not present

## 2012-11-29 DIAGNOSIS — R632 Polyphagia: Secondary | ICD-10-CM | POA: Diagnosis not present

## 2013-01-30 DIAGNOSIS — F329 Major depressive disorder, single episode, unspecified: Secondary | ICD-10-CM | POA: Diagnosis not present

## 2013-01-30 DIAGNOSIS — Z6841 Body Mass Index (BMI) 40.0 and over, adult: Secondary | ICD-10-CM | POA: Diagnosis not present

## 2013-01-30 DIAGNOSIS — R632 Polyphagia: Secondary | ICD-10-CM | POA: Diagnosis not present

## 2013-01-30 DIAGNOSIS — I10 Essential (primary) hypertension: Secondary | ICD-10-CM | POA: Diagnosis not present

## 2013-02-19 ENCOUNTER — Encounter (HOSPITAL_COMMUNITY): Payer: Self-pay | Admitting: Psychiatry

## 2013-02-19 ENCOUNTER — Ambulatory Visit (INDEPENDENT_AMBULATORY_CARE_PROVIDER_SITE_OTHER): Payer: Medicare Other | Admitting: Psychiatry

## 2013-02-19 VITALS — BP 109/68 | HR 93 | Ht 64.0 in | Wt 262.0 lb

## 2013-02-19 DIAGNOSIS — F4010 Social phobia, unspecified: Secondary | ICD-10-CM

## 2013-02-19 DIAGNOSIS — F332 Major depressive disorder, recurrent severe without psychotic features: Secondary | ICD-10-CM | POA: Diagnosis not present

## 2013-02-19 DIAGNOSIS — F319 Bipolar disorder, unspecified: Secondary | ICD-10-CM

## 2013-02-19 MED ORDER — PAROXETINE HCL 30 MG PO TABS: 15.0000 mg | ORAL_TABLET | ORAL | Status: DC

## 2013-02-19 MED ORDER — BUPROPION HCL 100 MG PO TABS: 200.0000 mg | ORAL_TABLET | Freq: Two times a day (BID) | ORAL | Status: DC

## 2013-02-19 MED ORDER — LITHIUM CARBONATE ER 300 MG PO TBCR: 300.0000 mg | EXTENDED_RELEASE_TABLET | Freq: Two times a day (BID) | ORAL | Status: DC
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
## 2013-04-01 DIAGNOSIS — J45909 Unspecified asthma, uncomplicated: Secondary | ICD-10-CM | POA: Diagnosis not present

## 2013-04-01 DIAGNOSIS — Z6841 Body Mass Index (BMI) 40.0 and over, adult: Secondary | ICD-10-CM | POA: Diagnosis not present

## 2013-04-01 DIAGNOSIS — M549 Dorsalgia, unspecified: Secondary | ICD-10-CM | POA: Diagnosis not present

## 2013-04-01 DIAGNOSIS — F329 Major depressive disorder, single episode, unspecified: Secondary | ICD-10-CM | POA: Diagnosis not present

## 2013-04-01 DIAGNOSIS — M5430 Sciatica, unspecified side: Secondary | ICD-10-CM | POA: Diagnosis not present

## 2013-04-01 DIAGNOSIS — Z1239 Encounter for other screening for malignant neoplasm of breast: Secondary | ICD-10-CM | POA: Diagnosis not present

## 2013-04-01 DIAGNOSIS — I10 Essential (primary) hypertension: Secondary | ICD-10-CM | POA: Diagnosis not present

## 2013-04-01 DIAGNOSIS — R632 Polyphagia: Secondary | ICD-10-CM | POA: Diagnosis not present

## 2013-05-21 ENCOUNTER — Ambulatory Visit (HOSPITAL_COMMUNITY): Payer: Self-pay | Admitting: Psychiatry

## 2013-06-09 ENCOUNTER — Encounter (HOSPITAL_COMMUNITY): Payer: Self-pay | Admitting: Psychiatry

## 2013-06-09 ENCOUNTER — Ambulatory Visit (INDEPENDENT_AMBULATORY_CARE_PROVIDER_SITE_OTHER): Payer: Medicare Other | Admitting: Psychiatry

## 2013-06-09 VITALS — BP 117/73 | HR 67 | Ht 64.0 in | Wt 272.0 lb

## 2013-06-09 DIAGNOSIS — F4010 Social phobia, unspecified: Secondary | ICD-10-CM

## 2013-06-09 DIAGNOSIS — F332 Major depressive disorder, recurrent severe without psychotic features: Secondary | ICD-10-CM | POA: Diagnosis not present

## 2013-06-09 MED ORDER — LITHIUM CARBONATE ER 300 MG PO TBCR: 300.0000 mg | EXTENDED_RELEASE_TABLET | Freq: Two times a day (BID) | ORAL | Status: DC

## 2013-06-09 MED ORDER — PAROXETINE HCL 30 MG PO TABS: 15.0000 mg | ORAL_TABLET | ORAL | Status: DC

## 2013-06-09 MED ORDER — BUPROPION HCL 100 MG PO TABS: 200.0000 mg | ORAL_TABLET | Freq: Two times a day (BID) | ORAL | Status: DC

## 2013-06-09 NOTE — Progress Notes (Addendum)
[facility] Behavioral Health Follow-up Outpatient Visit  Julie Powell [birth year]  Date: 06/09/2013  Chief Complaint   Patient presents with   .   Follow-up    HPI Comments: Ms. Ditto is a 52 Y/O woman with a past psychiatric history Major Depressive Disorder, severe, recurrent, and Social Phobia.    She reports feeling guilt about "not trying hard enough as a mother." She states she did not give him enough discipline.  She reports her son reconnected with his biological father and is now [place].  She reports that her son now has her car and she wants to get the car back but fears what repercussions would occur if she were to take the car back. The patient reports that she is still taking her medications and denies any side effects.  In the area of affective symptoms, patient appears mildly depressed. Patient denies current suicidal ideation, intent, or plan. She reports no further suicidal ideation for several months now. Patient denies current homicidal ideation, intent, or plan. Patient denies auditory hallucinations. Patient denies visual hallucinations. Patient denies symptoms of paranoia. Patient states sleep is good with 8 hours. Appetite has normal. Energy level is poor.  Patient endorses no further improvement in symptoms of anhedonia, but has not taken many efforts to improve her life. She continues to isolate herself from friends. She states she continues to experience some irritability. Patient reports feelings of hopelessness, helplessness, and guilt.   Denies any recent episodes consistent with mania, particularly decreased need for sleep with increased energy, grandiosity, impulsivity, hyperverbal and pressured speech, or increased productivity. The patient denies symptoms consistent with psychosis particularly auditory or visual hallucinations, thought broadcasting/insertion/withdrawal, or ideas of reference. The patient endorses excessive worry but will remove herself from situations  prior to getting to experiencing physical symptoms or panic attacks. She reports a history of trauma and symptoms consistent with PTSD including hypervigilance, feelings of numbness or inability to connect with others, and she endorses avoidance of friends and family, though she has been able to contact. She endorse avoidance of social interaction as a means of coping with anxiety.   Traumatic Brain Injury: No none   Review of Systems:  Constitutional: Negative for unexpected weight change.  Respiratory: Negative for chest tightness, but has some exertional dyspnea. Cardiovascular:  Negative for chest pain and palpitations.  GIT: Negative nausea, vomitting, constipation, diarrhea. Neurological: Negative for dizziness, seizures, syncope, and headaches.    Filed Vitals:   06/09/13 1353  BP: 117/73  Pulse: 67  Height: 5\' 4"  (1.626 m)  Weight: 272 lb (123.378 kg)   Physical Exam  Constitutional: She appears well-developed and well-nourished. No distress.  Skin: She is not diaphoretic.  No tremors of hands noted while patient was interviewed.   Past Psychiatric History: Reviewed   Diagnosis: Major Depressive Disorder, recurrent, severe   Hospitalizations: Several since age 49   Outpatient Care: Since   Substance Abuse Care: none   Self-Mutilation: Patient denies   Suicidal Attempts: Three attempts   Violent Behaviors: Patient denies.    Past Medical History: Reviewed  Past Medical History  Diagnosis Date  . Hypertension   . Bipolar 1 disorder 2010    ect  . Anxiety   . Asthma   . Fibroids   . Plantar fasciitis 2013  . Sciatica     RT hip   History of Loss of Consciousness: No  Seizure History: No  Cardiac History: Yes-HTN  Allergies: No Known Allergies  Current Medications: Reviewed  Current  Outpatient Prescriptions on File Prior to Visit  Medication Sig Dispense Refill  . albuterol (PROVENTIL HFA;VENTOLIN HFA) 108 (90 BASE) MCG/ACT inhaler Inhale 2 puffs into the  lungs every 6 (six) hours as needed.      Marland Kitchen buPROPion (WELLBUTRIN) 100 MG tablet Take 2 tablets (200 mg total) by mouth 2 (two) times daily.  360 tablet  1  . Cyanocobalamin (VITAMIN B-12) 5000 MCG SUBL Place 5,000 mcg under the tongue daily.      . diclofenac (VOLTAREN) 75 MG EC tablet Take 75 mg by mouth Twice daily.      . Flaxseed, Linseed, (FLAX SEED OIL PO) Take by mouth.      Marland Kitchen lisinopril-hydrochlorothiazide (PRINZIDE,ZESTORETIC) 10-12.5 MG per tablet Take 10-12.5 mg by mouth Daily.      Marland Kitchen lithium carbonate (LITHOBID) 300 MG CR tablet Take 1 tablet (300 mg total) by mouth 2 (two) times daily.  180 tablet  1  . Multiple Vitamin (MULTIVITAMIN) tablet Take 1 tablet by mouth daily.      Marland Kitchen PARoxetine (PAXIL) 30 MG tablet Take 0.5 tablets (15 mg total) by mouth every morning.  45 tablet  1  . phentermine 37.5 MG capsule Take 37.5 mg by mouth every morning.       No current facility-administered medications on file prior to visit.   Previous Psychotropic Medications: Reviewed  Medication   Bupropion   Paxil   Lithium   Lamictal-possible rash   Depakote-"spacey"   Prozac-choking sensation   Geodon-didn't work.   Aripiprazole-didn't want to take.    Substance Abuse History in the last 12 months:  Caffeine-Coffee 8 oz daily Alcohol: Patient denies.  Tobacco: Patient denies.  Illicit Drugs: Patient denies.   Blackouts: No   Family History: Reviewed  Family History   Problem  Relation  Age of Onset   .  Adopted: Yes   .  Aneurysm  Father     Mental Status Examination/Evaluation:  Objective: Appearance: Well Groomed   Eye Contact:: Good   Speech: Clear and Coherent and Normal Rate   Volume: Normal   Mood: "not very good" 3/10  (0=Very depressed; 5=Neutral; 10=Very Happy)   Affect: Appropriate, Congruent and Restricted   Thought Process: Goal Directed, Logical and Loose   Orientation: Full   Thought Content: WDL   Suicidal Thoughts: Patient denies   Homicidal Thoughts:  Patient denies   Judgement: Fair   Insight: Fair   Psychomotor Activity: Normal   Akathisia: No   Handed: Right   Memory: Immediate 3/3, Recent: 1/3   AIMS (if indicated): Not indicated at this time.   Assets: Communication Skills  Desire for Improvement    Assessment:  AXIS I: Major Depressive Disorder, severe, recurrent, and Social Phobia AXIS II: No diagnosis  AXIS III: Hypertension, Asthma, Fibroids  AXIS IV: other psychosocial or environmental problems and problems with primary support group  AXIS V: GAF: 49   Treatment Plan/Recommendations:  1. Affirm with the patient that the medications are taken as ordered. Patient expressed understanding of how their medications were to be used.  2. Continue the following psychiatric medications as written prior to this appointment with the following changes:  a) Will continue Lithium Carbonate 300 mg to one capsule in the morning and one capsules at bedtime. Will consider increase in dose based on Lithium levels.  b) Bupropion 200 mg BID  c) Paroxetine-15 mg-take one tablet for one month.  Patient does not want to increase patient. Will consider increase in Paxil  in the future, if needed. 3. Therapy: brief supportive therapy provided. Discussed psychosocial stressors. Again, encouraged patient to spend 15 minutes daily making changes to 4 important areas of her life.  4. Risks and benefits, side effects and alternatives discussed with patient, she was given an opportunity to ask questions about her medication, illness, and treatment. All current psychiatric medications have been reviewed and discussed with the patient and adjusted as clinically appropriate. The patient has been provided an accurate and updated list of the medications being now prescribed.  5. Patient told to call clinic if any problems occur. Patient advised to go to ER if she should develop SI/HI, side effects, or if symptoms worsen. Has crisis numbers to call if needed.  6.  Will repeat BUN/Creatinine and Lithium every three months. Will order today. 7. The patient was encouraged to keep all PCP and specialty clinic appointments.  8. Patient was instructed to return to clinic in 2 months.  9. The patient expressed understanding of the plan and agrees with the above.   Jacqulyn Cane, M.D. 06/09/2013 1:53 PM

## 2013-06-11 DIAGNOSIS — F332 Major depressive disorder, recurrent severe without psychotic features: Secondary | ICD-10-CM | POA: Insufficient documentation

## 2013-06-11 NOTE — Addendum Note (Signed)
Addended by: Larena Sox on: 06/11/2013 12:52 PM   Modules accepted: Level of Service

## 2013-06-12 LAB — LITHIUM LEVEL: Lithium Lvl: 0.6 mEq/L — ABNORMAL LOW (ref 0.80–1.40)

## 2013-06-12 LAB — CREATININE, SERUM: Creat: 1.06 mg/dL (ref 0.50–1.10)

## 2013-06-12 LAB — BUN: BUN: 16 mg/dL (ref 6–23)

## 2013-07-08 ENCOUNTER — Ambulatory Visit (INDEPENDENT_AMBULATORY_CARE_PROVIDER_SITE_OTHER): Payer: Medicare Other | Admitting: Psychiatry

## 2013-07-08 ENCOUNTER — Encounter (HOSPITAL_COMMUNITY): Payer: Self-pay | Admitting: Psychiatry

## 2013-07-08 VITALS — BP 99/71 | HR 76 | Ht 64.0 in | Wt 276.0 lb

## 2013-07-08 DIAGNOSIS — F4010 Social phobia, unspecified: Secondary | ICD-10-CM

## 2013-07-08 DIAGNOSIS — F332 Major depressive disorder, recurrent severe without psychotic features: Secondary | ICD-10-CM | POA: Diagnosis not present

## 2013-07-08 MED ORDER — PAROXETINE HCL 30 MG PO TABS: 30.0000 mg | ORAL_TABLET | ORAL | Status: DC

## 2013-07-08 NOTE — Progress Notes (Signed)
[facility] Behavioral Health Follow-up Outpatient Visit  Julie Powell [birth year]  Date: 07/08/2013  Chief Complaint   Patient presents with   .   Follow-up    HPI Comments: Julie Powell is a 52 Y/O woman with a past psychiatric history Major Depressive Disorder, severe, recurrent, and Social Phobia.    The patient reports that her sister died last month, and though she couldn't go for the funeral but did go prior to funeral to meet with the family.  She reports that she still has problems with her son who is now in [place] and wants to come back home, but is still asking for money.  She states she fears he family friend wants to move in with her temporarily from another state but was able to set her boundaries and say "no".  She denies any recent suicidal thoughts despite her current stressor but has some anxiety and depression. The patient reports that she is still taking her medications and denies any side effects.  In the area of affective symptoms, patient appears mildly depressed. Patient denies current suicidal ideation, intent, or plan. She reports no further suicidal ideation for several months now. Patient denies current homicidal ideation, intent, or plan. Patient denies auditory hallucinations. Patient denies visual hallucinations. Patient denies symptoms of paranoia. Patient states sleep is good with 8 hours. Appetite has normal. Energy level is poor.  Patient endorses no further improvement in symptoms of anhedonia, but has not taken many efforts to improve her life. She continues to isolate herself from friends. She states she continues to experience some irritability. Patient reports feelings of hopelessness, helplessness, and guilt.   Denies any recent episodes consistent with mania, particularly decreased need for sleep with increased energy, grandiosity, impulsivity, hyperverbal and pressured speech, or increased productivity. The patient denies symptoms consistent with psychosis  particularly auditory or visual hallucinations, thought broadcasting/insertion/withdrawal, or ideas of reference. The patient endorses excessive worry but will still remove herself from situations prior to getting to experiencing physical symptoms or panic attacks. However, in doing so she will isolate herself from friends, family or any potentially positive interaction. She reports a history of trauma and symptoms consistent with PTSD including hypervigilance, feelings of numbness or inability to connect with others, and she endorses avoidance of friends and family, though she has been able to contact. She endorse avoidance of social interaction as a means of coping with anxiety.   Traumatic Brain Injury: No none   Review of Systems:  Constitutional: Negative for unexpected weight change.  Respiratory: Negative for chest tightness, but has some exertional dyspnea. Cardiovascular:  Negative for chest pain and palpitations.  GIT: Negative nausea, vomitting, constipation, diarrhea. Neurological: Negative for dizziness, seizures, syncope, and headaches.    Filed Vitals:   07/08/13 1207  BP: 99/71  Pulse: 76  Height: 5\' 4"  (1.626 m)  Weight: 276 lb (125.193 kg)   Physical Exam  Constitutional: She appears well-developed and well-nourished. No distress.  Skin: She is not diaphoretic.  No tremors of hands noted while patient was interviewed.  Musculoskeletal: Strength & Muscle Tone: within normal limits Gait & Station: normal Patient leans: N/A   Past Psychiatric History: Reviewed   Diagnosis: Major Depressive Disorder, recurrent, severe   Hospitalizations: Several since age 42   Outpatient Care: Since   Substance Abuse Care: none   Self-Mutilation: Patient denies   Suicidal Attempts: Three attempts   Violent Behaviors: Patient denies.    Past Medical History: Reviewed  Past Medical History  Diagnosis Date  .  Hypertension   . Bipolar 1 disorder 2010    ect  . Anxiety   .  Asthma   . Fibroids   . Plantar fasciitis 2013  . Sciatica     RT hip   History of Loss of Consciousness: No  Seizure History: No  Cardiac History: Yes-HTN  Allergies: No Known Allergies   Current Medications: Reviewed   Current Outpatient Prescriptions on File Prior to Visit  Medication Sig Dispense Refill  . albuterol (PROVENTIL HFA;VENTOLIN HFA) 108 (90 BASE) MCG/ACT inhaler Inhale 2 puffs into the lungs every 6 (six) hours as needed.      Marland Kitchen buPROPion (WELLBUTRIN) 100 MG tablet Take 2 tablets (200 mg total) by mouth 2 (two) times daily.  360 tablet  1  . Cyanocobalamin (VITAMIN B-12) 5000 MCG SUBL Place 5,000 mcg under the tongue daily.      . diclofenac (VOLTAREN) 75 MG EC tablet Take 75 mg by mouth Twice daily.      . Flaxseed, Linseed, (FLAX SEED OIL PO) Take by mouth.      Marland Kitchen lisinopril-hydrochlorothiazide (PRINZIDE,ZESTORETIC) 10-12.5 MG per tablet Take 10-12.5 mg by mouth Daily.      Marland Kitchen lithium carbonate (LITHOBID) 300 MG CR tablet Take 1 tablet (300 mg total) by mouth 2 (two) times daily.  180 tablet  1  . Multiple Vitamin (MULTIVITAMIN) tablet Take 1 tablet by mouth daily.      Marland Kitchen PARoxetine (PAXIL) 30 MG tablet Take 0.5 tablets (15 mg total) by mouth every morning.  45 tablet  1   No current facility-administered medications on file prior to visit.   Previous Psychotropic Medications: Reviewed  Medication   Bupropion   Paxil   Lithium   Lamictal-possible rash   Depakote-"spacey"   Prozac-choking sensation   Geodon-didn't work.   Aripiprazole-didn't want to take.    Substance Abuse History in the last 12 months:  Caffeine-Coffee 8 oz daily Alcohol: Patient denies.  Tobacco: Patient denies.  Illicit Drugs: Patient denies.   Blackouts: No   Family History: Reviewed  Family History   Problem  Relation  Age of Onset   .  Adopted: Yes   .  Aneurysm  Father     Psychiatric Specialty Exam:  Objective: Appearance: Well Groomed   Eye Contact:: Good   Speech:  Clear and Coherent and Normal Rate   Volume: Normal   Mood: "fair" 4/10  (0=Very depressed; 5=Neutral; 10=Very Happy)   Affect: Appropriate, Congruent and Restricted   Thought Process: Goal Directed, Logical and Loose   Orientation: Full   Thought Content: WDL   Suicidal Thoughts: Patient denies   Homicidal Thoughts: Patient denies   Judgement: Fair   Insight: Fair   Psychomotor Activity: Normal   Akathisia: No   Handed: Right   Memory: Immediate 3/3, Recent: 2/3   AIMS (if indicated): Not indicated at this time.   Assets: Communication Skills  Desire for Improvement    Assessment:  AXIS I: Major Depressive Disorder, severe, recurrent, and Social Phobia   Treatment Plan/Recommendations:  1. Affirm with the patient that the medications are taken as ordered. Patient expressed understanding of how their medications were to be used.  2. Continue the following psychiatric medications as written prior to this appointment with the following changes:  a) Will continue Lithium Carbonate 300 mg to one capsule in the morning and one capsules at bedtime. Will consider increase in dose based on Lithium levels.  b) Bupropion 200 mg BID  c) Paroxetine-30  mg-take one tablet for one month. Patient to call and report results. Will consider increase in Paxil in the future, if needed. 3. Therapy: brief supportive therapy provided. Discussed psychosocial stressors. Again, encouraged patient to spend 15 minutes daily making changes to 4 important areas of her life.  4. Risks and benefits, side effects and alternatives discussed with patient, she was given an opportunity to ask questions about her medication, illness, and treatment. All current psychiatric medications have been reviewed and discussed with the patient and adjusted as clinically appropriate. The patient has been provided an accurate and updated list of the medications being now prescribed.  5. Patient told to call clinic if any problems  occur. Patient advised to go to ER if she should develop SI/HI, side effects, or if symptoms worsen. Has crisis numbers to call if needed.  6. Will repeat BUN/Creatinine and Lithium every three months. Will order today. 7. The patient was encouraged to keep all PCP and specialty clinic appointments.  8. Patient was instructed to return to clinic in 2 months.  9. The patient expressed understanding of the plan and agrees with the above.   Jacqulyn Cane, M.D.  07/08/2013 12:06 PM

## 2013-08-20 ENCOUNTER — Ambulatory Visit (HOSPITAL_COMMUNITY): Payer: Self-pay | Admitting: Psychiatry

## 2013-08-21 ENCOUNTER — Encounter (INDEPENDENT_AMBULATORY_CARE_PROVIDER_SITE_OTHER): Payer: Self-pay

## 2013-08-21 ENCOUNTER — Encounter (HOSPITAL_COMMUNITY): Payer: Self-pay | Admitting: Psychiatry

## 2013-08-21 ENCOUNTER — Ambulatory Visit (INDEPENDENT_AMBULATORY_CARE_PROVIDER_SITE_OTHER): Payer: Medicare Other | Admitting: Psychiatry

## 2013-08-21 VITALS — BP 104/63 | HR 70 | Ht 64.0 in | Wt 268.0 lb

## 2013-08-21 DIAGNOSIS — F4010 Social phobia, unspecified: Secondary | ICD-10-CM

## 2013-08-21 DIAGNOSIS — F332 Major depressive disorder, recurrent severe without psychotic features: Secondary | ICD-10-CM | POA: Diagnosis not present

## 2013-08-21 NOTE — Progress Notes (Addendum)
[facility] Follow-up Outpatient Visit  [name] Jan 21, 1961  Date: 08/21/2013  Chief Complaint   Patient presents with   .   Follow-up    HPI Comments: [name] is a 52  Y/O woman with a past psychiatric history Major Depressive Disorder, severe, recurrent, and Social Phobia.    The patient reports she is walking daily.  She states her son has called and wants to return back home from [place], and has given and elaborate plan for his future.  The patient reports some anxiety regarding her son coming back home, fearing he may return to his previous self-destructive behaviors.   She feels she is doing relatively well and is surprised by her weight loss, though she is waling at least 3 days a week. The patient reports that she is still taking her medications and denies any side effects.  In the area of affective symptoms, patient appears mildly anxious. Patient denies current suicidal ideation, intent, or plan. She reports no further suicidal ideation for several months now. Patient denies current homicidal ideation, intent, or plan. Patient denies auditory hallucinations. Patient denies visual hallucinations. Patient denies symptoms of paranoia. Patient states sleep is good with 6 hours. Appetite is good. Energy level is improving.  Patient endorses no further improvement in symptoms of anhedonia, but has not taken many efforts to improve her life. She continues to isolate herself from friends. She states she continues to experience some irritability. Patient reports feelings of hopelessness, helplessness, and guilt.   Denies any recent episodes consistent with mania, particularly decreased need for sleep with increased energy, grandiosity, impulsivity, hyperverbal and pressured speech, or increased productivity. The patient denies symptoms consistent with psychosis particularly auditory or visual hallucinations, thought broadcasting/insertion/withdrawal, or ideas of  reference. The patient endorses excessive worry but will still remove herself from situations prior to getting to experiencing physical symptoms or panic attacks. However, in doing so she will isolate herself from friends, family or any potentially positive interaction. She reports a history of trauma and symptoms consistent with PTSD including hypervigilance, feelings of numbness or inability to connect with others, and she endorses avoidance of friends and family, though she has been able to contact. She endorse avoidance of social interaction as a means of coping with anxiety.   Traumatic Brain Injury: No none   Review of Systems:  Constitutional: Negative for unexpected weight change.  Respiratory: Negative for chest tightness, but has some exertional dyspnea. Cardiovascular:  Negative for chest pain and palpitations.  GIT: Negative nausea, vomitting, constipation, diarrhea. Neurological: Negative for dizziness, seizures, syncope, and headaches.    Filed Vitals:   08/21/13 1408  BP: 104/63  Pulse: 70  Height: 5\' 4"  (1.626 m)  Weight: 268 lb (121.564 kg)    Physical Exam  Constitutional: She appears well-developed and well-nourished. No distress.  Skin: She is not diaphoretic.  No tremors of hands noted while patient was interviewed.  Musculoskeletal: Strength & Muscle Tone: within normal limits Gait & Station: normal Patient leans: N/A   Past Psychiatric History: Reviewed   Diagnosis: Major Depressive Disorder, recurrent, severe   Hospitalizations: Several since age 53   Outpatient Care: Since   Substance Abuse Care: none   Self-Mutilation: Patient denies   Suicidal Attempts: Three attempts   Violent Behaviors: Patient denies.    Past Medical History: Reviewed  Past Medical History  Diagnosis Date  . Hypertension   . Bipolar 1 disorder 2010    ect  . Anxiety   .  Asthma   . Fibroids   . Plantar fasciitis 2013  . Sciatica     RT hip   History of Loss of  Consciousness: No  Seizure History: No  Cardiac History: Yes-HTN  Allergies: No Known Allergies   Current Medications: Reviewed   Current Outpatient Prescriptions on File Prior to Visit  Medication Sig Dispense Refill  . albuterol (PROVENTIL HFA;VENTOLIN HFA) 108 (90 BASE) MCG/ACT inhaler Inhale 2 puffs into the lungs every 6 (six) hours as needed.      Marland Kitchen buPROPion (WELLBUTRIN) 100 MG tablet Take 2 tablets (200 mg total) by mouth 2 (two) times daily.  360 tablet  1  . Cyanocobalamin (VITAMIN B-12) 5000 MCG SUBL Place 5,000 mcg under the tongue daily.      . diclofenac (VOLTAREN) 75 MG EC tablet Take 75 mg by mouth Twice daily.      . Flaxseed, Linseed, (FLAX SEED OIL PO) Take by mouth.      Marland Kitchen lisinopril-hydrochlorothiazide (PRINZIDE,ZESTORETIC) 10-12.5 MG per tablet Take 10-12.5 mg by mouth Daily.      Marland Kitchen lithium carbonate (LITHOBID) 300 MG CR tablet Take 1 tablet (300 mg total) by mouth 2 (two) times daily.  180 tablet  1  . Multiple Vitamin (MULTIVITAMIN) tablet Take 1 tablet by mouth daily.      Marland Kitchen PARoxetine (PAXIL) 30 MG tablet Take 1 tablet (30 mg total) by mouth every morning.  90 tablet  1   No current facility-administered medications on file prior to visit.   Previous Psychotropic Medications: Reviewed  Medication   Bupropion   Paxil   Lithium   Lamictal-possible rash   Depakote-"spacey"   Prozac-choking sensation   Geodon-didn't work.   Aripiprazole-didn't want to take.    Substance Abuse History in the last 12 months:  Caffeine-Coffee 8 oz daily Alcohol: Patient denies.  Tobacco: Patient denies.  Illicit Drugs: Patient denies.   Blackouts: No   Family History: Reviewed  Family History   Problem  Relation  Age of Onset   .  Adopted: Yes   .  Aneurysm  Father     Psychiatric Specialty Exam:  Objective: Appearance: Well Groomed   Eye Contact:: Good   Speech: Clear and Coherent and Normal Rate   Volume: Normal   Mood: "probably better than the last visit."   Depression: 4/10 (0=Very depressed; 5=Neutral; 10=Very Happy)  Anxiety- 7/10 (0=no anxiety; 5= moderate/tolerable anxiety; 10= panic attacks)   Affect: Appropriate, Congruent and Restricted   Thought Process: Goal Directed, Logical and Loose   Orientation: Full   Thought Content: WDL   Suicidal Thoughts: Patient denies   Homicidal Thoughts: Patient denies   Judgement: Fair   Insight: Fair   Psychomotor Activity: Normal   Akathisia: No   Handed: Right   Memory: Immediate 3/3, Recent: 3/3   AIMS (if indicated): Not indicated at this time.   Assets: Communication Skills  Desire for Improvement    Assessment:  AXIS I: Major Depressive Disorder, severe, recurrent, and Social Phobia   Treatment Plan/Recommendations:   Plan of Care:  PLAN:  1. Affirm with the patient that the medications are taken as ordered. Patient  expressed understanding of how their medications were to be used.    Laboratory:   Will repeat BUN/Creatinine and Lithium every three months.   Psychotherapy: Therapy: brief supportive therapy provided. Discussed psychosocial stressors. Again, encouraged patient to continue spending time daily making changes to 4 important areas of her life.   Medications:  Continue/  Start the following psychiatric medications as written prior to this appointment/ with the following changes::  a) Will continue Lithium Carbonate 300 mg to one capsule in the morning and one capsules at bedtime. Will consider increase in dose based on Lithium levels.  b)  Bupropion 200 mg BID c) Paroxetine-30 mg-take one tablet for one month. Offered increase of medications, patient declined at this time, however will increase to 40 mg in the interval between now and her next visit if patient has worsening of anxiety.  -Risks and benefits, side effects and alternatives discussed with patient, she was given an opportunity to ask questions about her medication, illness, and treatment. All current psychiatric  medications have been reviewed and discussed with the patient and adjusted as clinically appropriate. The patient has been provided an accurate and updated list of the medications being now prescribed.   Routine PRN Medications:  Negative  Consultations: The patient was encouraged to keep all PCP and specialty clinic appointments.   Safety Concerns:   Patient told to call clinic if any problems occur. Patient advised to go to  ER  if she should develop SI/HI, side effects, or if symptoms worsen. Has crisis numbers to call if needed.    Other:   8. Patient was instructed to return to clinic in 1 months.  9. The patient was advised to call and cancel their mental health appointment within 24 hours of the appointment, if they are unable to keep the appointment, as well as the three no show and termination from clinic policy. 10. The patient expressed understanding of the plan and agrees with the above.     Jacqulyn Cane, M.D.  08/21/2013 2:08 PM

## 2013-08-24 NOTE — Addendum Note (Signed)
Addended by: Larena Sox on: 08/24/2013 07:21 PM   Modules accepted: Level of Service

## 2013-08-25 DIAGNOSIS — Z1231 Encounter for screening mammogram for malignant neoplasm of breast: Secondary | ICD-10-CM | POA: Diagnosis not present

## 2013-09-02 ENCOUNTER — Emergency Department (HOSPITAL_BASED_OUTPATIENT_CLINIC_OR_DEPARTMENT_OTHER)
Admission: EM | Admit: 2013-09-02 | Discharge: 2013-09-03 | Disposition: A | Discharge status: Home / Self Care | Payer: Medicare Other | Attending: Emergency Medicine | Admitting: Emergency Medicine

## 2013-09-02 ENCOUNTER — Encounter (HOSPITAL_BASED_OUTPATIENT_CLINIC_OR_DEPARTMENT_OTHER): Payer: Self-pay | Admitting: Emergency Medicine

## 2013-09-02 DIAGNOSIS — F411 Generalized anxiety disorder: Secondary | ICD-10-CM | POA: Insufficient documentation

## 2013-09-02 DIAGNOSIS — Z8739 Personal history of other diseases of the musculoskeletal system and connective tissue: Secondary | ICD-10-CM | POA: Insufficient documentation

## 2013-09-02 DIAGNOSIS — Z9889 Other specified postprocedural states: Secondary | ICD-10-CM | POA: Diagnosis not present

## 2013-09-02 DIAGNOSIS — R1013 Epigastric pain: Secondary | ICD-10-CM | POA: Insufficient documentation

## 2013-09-02 DIAGNOSIS — Z79899 Other long term (current) drug therapy: Secondary | ICD-10-CM | POA: Diagnosis not present

## 2013-09-02 DIAGNOSIS — J45909 Unspecified asthma, uncomplicated: Secondary | ICD-10-CM | POA: Insufficient documentation

## 2013-09-02 DIAGNOSIS — Z791 Long term (current) use of non-steroidal anti-inflammatories (NSAID): Secondary | ICD-10-CM | POA: Insufficient documentation

## 2013-09-02 DIAGNOSIS — Z8742 Personal history of other diseases of the female genital tract: Secondary | ICD-10-CM | POA: Diagnosis not present

## 2013-09-02 DIAGNOSIS — F319 Bipolar disorder, unspecified: Secondary | ICD-10-CM | POA: Insufficient documentation

## 2013-09-02 DIAGNOSIS — Z87891 Personal history of nicotine dependence: Secondary | ICD-10-CM | POA: Diagnosis not present

## 2013-09-02 DIAGNOSIS — I10 Essential (primary) hypertension: Secondary | ICD-10-CM | POA: Diagnosis not present

## 2013-09-02 DIAGNOSIS — R109 Unspecified abdominal pain: Secondary | ICD-10-CM

## 2013-09-02 DIAGNOSIS — R112 Nausea with vomiting, unspecified: Secondary | ICD-10-CM | POA: Diagnosis not present

## 2013-09-02 LAB — CBC WITH DIFFERENTIAL/PLATELET
Basophils Absolute: 0 10*3/uL (ref 0.0–0.1)
Basophils Relative: 0 % (ref 0–1)
Eosinophils Absolute: 0.1 10*3/uL (ref 0.0–0.7)
Eosinophils Relative: 1 % (ref 0–5)
HCT: 38.1 % (ref 36.0–46.0)
Hemoglobin: 12.7 g/dL (ref 12.0–15.0)
Lymphocytes Relative: 9 % — ABNORMAL LOW (ref 12–46)
Lymphs Abs: 0.7 10*3/uL (ref 0.7–4.0)
MCH: 31.1 pg (ref 26.0–34.0)
MCHC: 33.3 g/dL (ref 30.0–36.0)
MCV: 93.2 fL (ref 78.0–100.0)
Monocytes Absolute: 0.4 10*3/uL (ref 0.1–1.0)
Monocytes Relative: 5 % (ref 3–12)
Neutro Abs: 6.7 10*3/uL (ref 1.7–7.7)
Neutrophils Relative %: 85 % — ABNORMAL HIGH (ref 43–77)
Platelets: 232 10*3/uL (ref 150–400)
RBC: 4.09 MIL/uL (ref 3.87–5.11)
RDW: 13.6 % (ref 11.5–15.5)
WBC: 7.8 10*3/uL (ref 4.0–10.5)

## 2013-09-02 LAB — COMPREHENSIVE METABOLIC PANEL
ALT: 16 U/L (ref 0–35)
AST: 19 U/L (ref 0–37)
Albumin: 4 g/dL (ref 3.5–5.2)
Alkaline Phosphatase: 96 U/L (ref 39–117)
BUN: 20 mg/dL (ref 6–23)
CO2: 24 mEq/L (ref 19–32)
Calcium: 9.8 mg/dL (ref 8.4–10.5)
Chloride: 101 mEq/L (ref 96–112)
Creatinine, Ser: 0.9 mg/dL (ref 0.50–1.10)
GFR calc Af Amer: 85 mL/min — ABNORMAL LOW (ref >90)
GFR calc non Af Amer: 73 mL/min — ABNORMAL LOW (ref >90)
Glucose, Bld: 130 mg/dL — ABNORMAL HIGH (ref 70–99)
Potassium: 4 mEq/L (ref 3.5–5.1)
Sodium: 137 mEq/L (ref 135–145)
Total Bilirubin: 0.3 mg/dL (ref 0.3–1.2)
Total Protein: 7.6 g/dL (ref 6.0–8.3)

## 2013-09-02 LAB — URINALYSIS, ROUTINE W REFLEX MICROSCOPIC
Bilirubin Urine: NEGATIVE
Glucose, UA: NEGATIVE mg/dL
Hgb urine dipstick: NEGATIVE
Ketones, ur: NEGATIVE mg/dL
Leukocytes, UA: NEGATIVE
Nitrite: NEGATIVE
Protein, ur: NEGATIVE mg/dL
Specific Gravity, Urine: 1.022 (ref 1.005–1.030)
Urobilinogen, UA: 1 mg/dL (ref 0.0–1.0)
pH: 6.5 (ref 5.0–8.0)

## 2013-09-02 LAB — LIPASE, BLOOD: Lipase: 15 U/L (ref 11–59)

## 2013-09-02 MED ORDER — ONDANSETRON HCL 4 MG/2ML IJ SOLN
4.0000 mg | Freq: Once | INTRAMUSCULAR | Status: AC
  Administered 2013-09-02: 4 mg via INTRAVENOUS
  Filled 2013-09-02: qty 2

## 2013-09-02 MED ORDER — SODIUM CHLORIDE 0.9 % IV BOLUS (SEPSIS)
1000.0000 mL | Freq: Once | INTRAVENOUS | Status: AC
  Administered 2013-09-02: 1000 mL via INTRAVENOUS

## 2013-09-02 NOTE — ED Notes (Signed)
Mid abd pain since this morning and bil low back pain.  Vomiting but no diarrhea.

## 2013-09-02 NOTE — ED Provider Notes (Addendum)
CSN: [id number]     Arrival date & time 09/02/13  2002 History   First MD Initiated Contact with Patient 09/02/13 2348     Chief Complaint  Patient presents with  . Abdominal Pain   (Consider location/radiation/quality/duration/timing/severity/associated sxs/prior Treatment) HPI This is a 52 year old female with a history of episodic abdominal pain. These episodes occur as often as once a month or sometimes less frequently. She has had a workup that includes a normal upper endoscopy and a normal CT scan. She is status post cholecystectomy. She is here with epigastric abdominal pain that began this morning and has gradually worsened. She states she normally can get relief with Pepto-Bismol but that did not occur this occasion. The pain is severe but is not worse with movement or palpation. They've been accompanied by nausea and vomiting but no diarrhea, constipation, fever or dysuria. She does feel chilled at the present. She denies chest pain or shortness of breath. She was given Zofran IV earlier per protocol with partial relief of her nausea.  Past Medical History  Diagnosis Date  . Hypertension   . Bipolar 1 disorder 2010    ect  . Anxiety   . Asthma   . Fibroids   . Plantar fasciitis 2013  . Sciatica     RT hip   Past Surgical History  Procedure Laterality Date  . Sp cholecystomy  1986  . Cesarean section  1995  . Gastric bypass  2003  . Cystectomy      throat   Family History  Problem Relation Age of Onset  . Adopted: Yes  . Aneurysm Father   . Alcohol abuse Father   . Alcohol abuse Brother   . Alcohol abuse Brother   . Alcohol abuse Brother   . Lung cancer Sister    History  Substance Use Topics  . Smoking status: Former Smoker -- 0.50 packs/day for 10 years    Types: Cigarettes    Quit date: 11/07/1991  . Smokeless tobacco: Never Used  . Alcohol Use: 1.5 oz/week    3 drink(s) per week   OB History   Grav Para Term Preterm Abortions TAB SAB Ect Mult Living    1 1 1       1      Review of Systems  All other systems reviewed and are negative.    Allergies  Review of patient's allergies indicates no known allergies.  Home Medications   Current Outpatient Rx  Name  Route  Sig  Dispense  Refill  . albuterol (PROVENTIL HFA;VENTOLIN HFA) 108 (90 BASE) MCG/ACT inhaler   Inhalation   Inhale 2 puffs into the lungs every 6 (six) hours as needed.         Marland Kitchen buPROPion (WELLBUTRIN) 100 MG tablet   Oral   Take 2 tablets (200 mg total) by mouth 2 (two) times daily.   360 tablet   1     This is a 90 day supply.   . Cyanocobalamin (VITAMIN B-12) 5000 MCG SUBL   Sublingual   Place 5,000 mcg under the tongue daily.         . diclofenac (VOLTAREN) 75 MG EC tablet   Oral   Take 75 mg by mouth Twice daily.         . Flaxseed, Linseed, (FLAX SEED OIL PO)   Oral   Take by mouth.         Marland Kitchen lisinopril-hydrochlorothiazide (PRINZIDE,ZESTORETIC) 10-12.5 MG per tablet   Oral  Take 10-12.5 mg by mouth Daily.         Marland Kitchen lithium carbonate (LITHOBID) 300 MG CR tablet   Oral   Take 1 tablet (300 mg total) by mouth 2 (two) times daily.   180 tablet   1     This is a 90 day supply.   . Multiple Vitamin (MULTIVITAMIN) tablet   Oral   Take 1 tablet by mouth daily.         Marland Kitchen PARoxetine (PAXIL) 30 MG tablet   Oral   Take 1 tablet (30 mg total) by mouth every morning.   90 tablet   1     This is a 90 day supply.    BP 128/67  Pulse 72  Temp(Src) 98.6 F (37 C) (Oral)  Resp 17  Ht 5\' 4"  (1.626 m)  Wt 270 lb (122.471 kg)  BMI 46.32 kg/m2  SpO2 98%  LMP 05/19/2012  Physical Exam General: Well-developed, obese female in no acute distress; appearance consistent with age of record HENT: normocephalic; atraumatic Eyes: pupils equal, round and reactive to light; extraocular muscles intact Neck: supple Heart: regular rate and rhythm Lungs: clear to auscultation bilaterally Abdomen: soft; obese; nontender; bowel sounds  present Extremities: No deformity; full range of motion; pulses normal; no edema Neurologic: Awake, alert and oriented; motor function intact in all extremities and symmetric; no facial droop Skin: Warm and dry Psychiatric: Flat affect    ED Course  Procedures (including critical care time)  MDM   Nursing notes and vitals signs, including pulse oximetry, reviewed.  Summary of this visit's results, reviewed by myself:  Labs:  Results for orders placed during the hospital encounter of 09/02/13 (from the past 24 hour(s))  URINALYSIS, ROUTINE W REFLEX MICROSCOPIC     Status: None   Collection Time    09/02/13  8:35 PM      Result Value Range   Color, Urine YELLOW  YELLOW   APPearance CLEAR  CLEAR   Specific Gravity, Urine 1.022  1.005 - 1.030   pH 6.5  5.0 - 8.0   Glucose, UA NEGATIVE  NEGATIVE mg/dL   Hgb urine dipstick NEGATIVE  NEGATIVE   Bilirubin Urine NEGATIVE  NEGATIVE   Ketones, ur NEGATIVE  NEGATIVE mg/dL   Protein, ur NEGATIVE  NEGATIVE mg/dL   Urobilinogen, UA 1.0  0.0 - 1.0 mg/dL   Nitrite NEGATIVE  NEGATIVE   Leukocytes, UA NEGATIVE  NEGATIVE  CBC WITH DIFFERENTIAL     Status: Abnormal   Collection Time    09/02/13 11:00 PM      Result Value Range   WBC 7.8  4.0 - 10.5 K/uL   RBC 4.09  3.87 - 5.11 MIL/uL   Hemoglobin 12.7  12.0 - 15.0 g/dL   HCT 81.1  91.4 - 78.2 %   MCV 93.2  78.0 - 100.0 fL   MCH 31.1  26.0 - 34.0 pg   MCHC 33.3  30.0 - 36.0 g/dL   RDW 95.6  21.3 - 08.6 %   Platelets 232  150 - 400 K/uL   Neutrophils Relative % 85 (*) 43 - 77 %   Neutro Abs 6.7  1.7 - 7.7 K/uL   Lymphocytes Relative 9 (*) 12 - 46 %   Lymphs Abs 0.7  0.7 - 4.0 K/uL   Monocytes Relative 5  3 - 12 %   Monocytes Absolute 0.4  0.1 - 1.0 K/uL   Eosinophils Relative 1  0 - 5 %  Eosinophils Absolute 0.1  0.0 - 0.7 K/uL   Basophils Relative 0  0 - 1 %   Basophils Absolute 0.0  0.0 - 0.1 K/uL  COMPREHENSIVE METABOLIC PANEL     Status: Abnormal   Collection Time     09/02/13 11:00 PM      Result Value Range   Sodium 137  135 - 145 mEq/L   Potassium 4.0  3.5 - 5.1 mEq/L   Chloride 101  96 - 112 mEq/L   CO2 24  19 - 32 mEq/L   Glucose, Bld 130 (*) 70 - 99 mg/dL   BUN 20  6 - 23 mg/dL   Creatinine, Ser 1.61  0.50 - 1.10 mg/dL   Calcium 9.8  8.4 - 09.6 mg/dL   Total Protein 7.6  6.0 - 8.3 g/dL   Albumin 4.0  3.5 - 5.2 g/dL   AST 19  0 - 37 U/L   ALT 16  0 - 35 U/L   Alkaline Phosphatase 96  39 - 117 U/L   Total Bilirubin 0.3  0.3 - 1.2 mg/dL   GFR calc non Af Amer 73 (*) >90 mL/min   GFR calc Af Amer 85 (*) >90 mL/min  LIPASE, BLOOD     Status: None   Collection Time    09/02/13 11:00 PM      Result Value Range   Lipase 15  11 - 59 U/L   2:22 AM Patient feeling better after IV medications. She has had an extensive workup in the past we will not reimage her at this time unless her symptoms persist or worsen. In the meantime we will treat her with a prescription for analgesic and antiemetic.     Hanley Seamen, MD 09/03/13 0222  Hanley Seamen, MD 09/03/13 0454

## 2013-09-03 LAB — LITHIUM LEVEL: Lithium Lvl: 0.57 mEq/L — ABNORMAL LOW (ref 0.80–1.40)

## 2013-09-03 MED ORDER — ONDANSETRON 8 MG PO TBDP: 8.0000 mg | ORAL_TABLET | Freq: Three times a day (TID) | ORAL | Status: DC | PRN

## 2013-09-03 MED ORDER — GI COCKTAIL ~~LOC~~
ORAL | Status: AC
  Filled 2013-09-03: qty 30

## 2013-09-03 MED ORDER — FENTANYL CITRATE 0.05 MG/ML IJ SOLN
INTRAMUSCULAR | Status: AC
  Administered 2013-09-03: 50 ug via INTRAVENOUS
  Filled 2013-09-03: qty 2

## 2013-09-03 MED ORDER — FENTANYL CITRATE 0.05 MG/ML IJ SOLN
50.0000 ug | Freq: Once | INTRAMUSCULAR | Status: AC
  Administered 2013-09-03: 50 ug via INTRAVENOUS

## 2013-09-03 MED ORDER — HYDROCODONE-ACETAMINOPHEN 5-325 MG PO TABS: 1.0000 | ORAL_TABLET | Freq: Four times a day (QID) | ORAL | Status: DC | PRN

## 2013-09-03 MED ORDER — METOCLOPRAMIDE HCL 5 MG/ML IJ SOLN
10.0000 mg | Freq: Once | INTRAMUSCULAR | Status: AC
  Administered 2013-09-03: 10 mg via INTRAVENOUS
  Filled 2013-09-03: qty 2

## 2013-09-03 MED ORDER — FENTANYL CITRATE 0.05 MG/ML IJ SOLN
100.0000 ug | Freq: Once | INTRAMUSCULAR | Status: AC
  Administered 2013-09-03: 100 ug via INTRAVENOUS
  Filled 2013-09-03: qty 2

## 2013-09-03 MED ORDER — GI COCKTAIL ~~LOC~~
30.0000 mL | Freq: Once | ORAL | Status: AC
  Administered 2013-09-03: 30 mL via ORAL

## 2013-09-09 DIAGNOSIS — Z23 Encounter for immunization: Secondary | ICD-10-CM | POA: Diagnosis not present

## 2013-09-17 DIAGNOSIS — F3289 Other specified depressive episodes: Secondary | ICD-10-CM | POA: Diagnosis not present

## 2013-09-17 DIAGNOSIS — I10 Essential (primary) hypertension: Secondary | ICD-10-CM | POA: Diagnosis not present

## 2013-09-17 DIAGNOSIS — K921 Melena: Secondary | ICD-10-CM | POA: Diagnosis not present

## 2013-09-17 DIAGNOSIS — J45909 Unspecified asthma, uncomplicated: Secondary | ICD-10-CM | POA: Diagnosis not present

## 2013-09-17 DIAGNOSIS — M5430 Sciatica, unspecified side: Secondary | ICD-10-CM | POA: Diagnosis not present

## 2013-09-17 DIAGNOSIS — E538 Deficiency of other specified B group vitamins: Secondary | ICD-10-CM | POA: Diagnosis not present

## 2013-09-17 DIAGNOSIS — E559 Vitamin D deficiency, unspecified: Secondary | ICD-10-CM | POA: Diagnosis not present

## 2013-09-17 DIAGNOSIS — F329 Major depressive disorder, single episode, unspecified: Secondary | ICD-10-CM | POA: Diagnosis not present

## 2013-09-25 ENCOUNTER — Encounter (HOSPITAL_COMMUNITY): Payer: Self-pay | Admitting: Psychiatry

## 2013-09-25 ENCOUNTER — Ambulatory Visit (INDEPENDENT_AMBULATORY_CARE_PROVIDER_SITE_OTHER): Payer: Medicare Other | Admitting: Psychiatry

## 2013-09-25 VITALS — BP 103/64 | HR 88 | Ht 64.0 in | Wt 267.0 lb

## 2013-09-25 DIAGNOSIS — F332 Major depressive disorder, recurrent severe without psychotic features: Secondary | ICD-10-CM

## 2013-09-25 DIAGNOSIS — F4010 Social phobia, unspecified: Secondary | ICD-10-CM | POA: Diagnosis not present

## 2013-09-25 MED ORDER — LITHIUM CARBONATE ER 300 MG PO TBCR: 300.0000 mg | EXTENDED_RELEASE_TABLET | Freq: Two times a day (BID) | ORAL | Status: DC

## 2013-09-25 MED ORDER — PAROXETINE HCL 30 MG PO TABS: 30.0000 mg | ORAL_TABLET | ORAL | Status: DC

## 2013-09-25 MED ORDER — BUPROPION HCL 100 MG PO TABS: 200.0000 mg | ORAL_TABLET | Freq: Two times a day (BID) | ORAL | Status: DC

## 2013-09-25 NOTE — Progress Notes (Signed)
[facility] Follow-up Outpatient Visit  Julie Powell 1961/02/11  Date: 09/25/2013  Chief Complaint   Patient presents with   .   Follow-up    HPI Comments:          HPI Comments: Ms. Karge is a 52  Y/O woman with a past psychiatric history Major Depressive Disorder, severe, recurrent, and Social Phobia.  . The patient is referred for psychiatric services for  medication management.   . Location: She reports some increased stress since her son moved back home 3 weeks ago.  . Quality: The patient reports she is walking daily.  She states her son has called and wants to return back home from [place], and has given and elaborate plan for his future. The patient reports some anxiety regarding her son coming back home, fearing he may return to his previous self-destructive behaviors.   She feels she is doing relatively well and is surprised by her weight loss, though she is waling at least 3 days a week. The patient reports that she is still taking her medications and denies any side effects.  In the area of affective symptoms, patient appears mildly anxious. Patient denies current suicidal ideation, intent, or plan. She reports no further suicidal ideation for several months now. Patient denies current homicidal ideation, intent, or plan. Patient denies auditory hallucinations. Patient denies visual hallucinations. Patient denies symptoms of paranoia. Patient states sleep is good with 6 hours. Appetite is good. Energy level is improving.  Patient endorses no further improvement in symptoms of anhedonia, but has not taken many efforts to improve her life. She continues to isolate herself from friends. She states she continues to experience some irritability. Patient reports feelings of hopelessness, helplessness, and guilt.   . Severity: Depression: 6/10 (0=Very depressed; 5=Neutral; 10=Very Happy)  Anxiety- 8/10 (0=no anxiety; 5= moderate/tolerable anxiety; 10= panic  attacks)  . Duration: Since age 18, had a suicide attempt at age 52.   . Timing: Worse    . Context: Interactions with son  . Modifying factors: Patient   . Associated signs and symptoms: Denies any recent episodes consistent with mania, particularly decreased need for sleep with increased energy, grandiosity, impulsivity, hyperverbal and pressured speech, or increased productivity. The patient denies symptoms consistent with psychosis particularly auditory or visual hallucinations, thought broadcasting/insertion/withdrawal, or ideas of reference. The patient endorses excessive worry but will still remove herself from situations prior to getting to experiencing physical symptoms or panic attacks. However, in doing so she will isolate herself from friends, family or any potentially positive interaction. She reports a history of trauma and symptoms consistent with PTSD including hypervigilance, feelings of numbness or inability to connect with others, and she endorses avoidance of friends and family, though she has been able to contact. She endorse avoidance of social interaction as a means of coping with anxiety.    Traumatic Brain Injury: No none   Review of Systems:  Constitutional: Negative for unexpected weight change.  Respiratory: Negative for chest tightness, but has some exertional dyspnea. Cardiovascular:  Negative for chest pain and palpitations.  GIT: Negative nausea, vomitting, constipation, diarrhea. Neurological: Negative for dizziness, seizures, syncope, and headaches.    Filed Vitals:   09/25/13 1136  BP: 103/64  Pulse: 88  Height: 5\' 4"  (1.626 m)  Weight: 267 lb (121.11 kg)    Physical Exam  Constitutional: She appears well-developed and well-nourished. No distress.  Skin: She is not diaphoretic.  No tremors of hands noted while patient was  interviewed.   Musculoskeletal: Strength & Muscle Tone: within normal limits Gait & Station: normal Patient leans:  N/A   Past Psychiatric History: Reviewed   Diagnosis: Major Depressive Disorder, recurrent, severe   Hospitalizations: Several since age 53   Outpatient Care: Since   Substance Abuse Care: none   Self-Mutilation: Patient denies   Suicidal Attempts: Three attempts   Violent Behaviors: Patient denies.    Past Medical History: Reviewed  Past Medical History  Diagnosis Date  . Hypertension   . Bipolar 1 disorder 2010    ect  . Anxiety   . Asthma   . Fibroids   . Plantar fasciitis 2013  . Sciatica     RT hip   History of Loss of Consciousness: No  Seizure History: No  Cardiac History: Yes-HTN  Allergies: No Known Allergies   Current Medications: Reviewed   Current Outpatient Prescriptions on File Prior to Visit  Medication Sig Dispense Refill  . albuterol (PROVENTIL HFA;VENTOLIN HFA) 108 (90 BASE) MCG/ACT inhaler Inhale 2 puffs into the lungs every 6 (six) hours as needed.      Marland Kitchen buPROPion (WELLBUTRIN) 100 MG tablet Take 2 tablets (200 mg total) by mouth 2 (two) times daily.  360 tablet  1  . Cyanocobalamin (VITAMIN B-12) 5000 MCG SUBL Place 5,000 mcg under the tongue daily.      . diclofenac (VOLTAREN) 75 MG EC tablet Take 75 mg by mouth Twice daily.      . Flaxseed, Linseed, (FLAX SEED OIL PO) Take by mouth.      Marland Kitchen HYDROcodone-acetaminophen (NORCO/VICODIN) 5-325 MG per tablet Take 1-2 tablets by mouth every 6 (six) hours as needed for pain.  10 tablet  0  . lisinopril-hydrochlorothiazide (PRINZIDE,ZESTORETIC) 10-12.5 MG per tablet Take 10-12.5 mg by mouth Daily.      Marland Kitchen lithium carbonate (LITHOBID) 300 MG CR tablet Take 1 tablet (300 mg total) by mouth 2 (two) times daily.  180 tablet  1  . Multiple Vitamin (MULTIVITAMIN) tablet Take 1 tablet by mouth daily.      . ondansetron (ZOFRAN ODT) 8 MG disintegrating tablet Take 1 tablet (8 mg total) by mouth every 8 (eight) hours as needed.  10 tablet  0  . PARoxetine (PAXIL) 30 MG tablet Take 1 tablet (30 mg total) by mouth every  morning.  90 tablet  1   No current facility-administered medications on file prior to visit.   Previous Psychotropic Medications: Reviewed  Medication   Bupropion   Paxil   Lithium   Lamictal-possible rash   Depakote-"spacey"   Prozac-choking sensation   Geodon-didn't work.   Aripiprazole-didn't want to take.    Substance Abuse History in the last 12 months:  Caffeine-Coffee 8 oz daily Alcohol: Patient denies.  Tobacco: Patient denies.  Illicit Drugs: Patient denies.   Blackouts: No   Family History: Reviewed  Family History   Problem  Relation  Age of Onset   .  Adopted: Yes   .  Aneurysm  Father     Psychiatric Specialty Exam:  Objective: Appearance: Well Groomed   Eye Contact:: Good   Speech: Clear and Coherent and Normal Rate   Volume: Normal   Mood: "probably not that bad"  Depression: 6/10 (0=Very depressed; 5=Neutral; 10=Very Happy)  Anxiety- 8/10 (0=no anxiety; 5= moderate/tolerable anxiety; 10= panic attacks)   Affect: Appropriate, Congruent and Restricted   Thought Process: Goal Directed, Logical and Loose   Orientation: Full   Thought Content: WDL   Suicidal Thoughts:  Patient denies   Homicidal Thoughts: Patient denies   Judgement: Fair   Insight: Fair   Psychomotor Activity: Normal   Akathisia: No   Handed: Right   Memory: Immediate 3/3, Recent: 1/3   AIMS (if indicated): Not indicated at this time.   Assets: Communication Skills  Desire for Improvement    Assessment:  AXIS I: Major Depressive Disorder, severe, recurrent, and Social Phobia   Treatment Plan/Recommendations:   Plan of Care:  PLAN:  1. Affirm with the patient that the medications are taken as ordered. Patient  expressed understanding of how their medications were to be used.    Laboratory:   Will repeat BUN/Creatinine and Lithium every three months.   Psychotherapy: Therapy: brief supportive therapy provided. Discussed psychosocial stressors. Again, encouraged patient to  continue spending time daily making changes to 4 important areas of her life.   Medications:  Continue the following psychiatric medications as written prior to this appointment/ with the following changes::  a) Will continue Lithium Carbonate 300 mg to one capsule in the morning and one capsules at bedtime. Will consider increase in dose based on Lithium levels.  b)  Bupropion 200 mg BID- No change in dose at this time.  c) Paroxetine-30 mg-take one tablet for one month. Offered increase of medications, patient declined at this time, however will increase to 40 mg in the interval between now and her next visit if patient has worsening of anxiety. No change in dose at this time. D) As noted in chart.   -Risks and benefits, side effects and alternatives discussed with patient, she was given an opportunity to ask questions about her medication, illness, and treatment. All current psychiatric medications have been reviewed and discussed with the patient and adjusted as clinically appropriate. The patient has been provided an accurate and updated list of the medications being now prescribed.   Routine PRN Medications:  Negative  Consultations: The patient was encouraged to keep all PCP and specialty clinic appointments.   Safety Concerns:   Patient told to call clinic if any problems occur. Patient advised to go to  ER  if she should develop SI/HI, side effects, or if symptoms worsen. Has crisis numbers to call if needed.    Other:   8. Patient was instructed to return to clinic in 1 months.  9. The patient was advised to call and cancel their mental health appointment within 24 hours of the appointment, if they are unable to keep the appointment, as well as the three no show and termination from clinic policy. 10. The patient expressed understanding of the plan and agrees with the above.   Jacqulyn Cane, M.D.  09/25/2013 11:34 AM

## 2013-10-08 DIAGNOSIS — R198 Other specified symptoms and signs involving the digestive system and abdomen: Secondary | ICD-10-CM | POA: Diagnosis not present

## 2013-10-08 DIAGNOSIS — Z1211 Encounter for screening for malignant neoplasm of colon: Secondary | ICD-10-CM | POA: Diagnosis not present

## 2013-10-08 DIAGNOSIS — Z538 Procedure and treatment not carried out for other reasons: Secondary | ICD-10-CM | POA: Diagnosis not present

## 2013-10-14 DIAGNOSIS — N950 Postmenopausal bleeding: Secondary | ICD-10-CM | POA: Diagnosis not present

## 2013-10-14 DIAGNOSIS — N852 Hypertrophy of uterus: Secondary | ICD-10-CM | POA: Diagnosis not present

## 2013-10-14 DIAGNOSIS — D259 Leiomyoma of uterus, unspecified: Secondary | ICD-10-CM | POA: Diagnosis not present

## 2013-11-05 DIAGNOSIS — Z1211 Encounter for screening for malignant neoplasm of colon: Secondary | ICD-10-CM | POA: Diagnosis not present

## 2013-11-05 DIAGNOSIS — Z538 Procedure and treatment not carried out for other reasons: Secondary | ICD-10-CM | POA: Diagnosis not present

## 2013-11-07 LAB — HM COLONOSCOPY: HM Colonoscopy: NORMAL

## 2013-12-03 ENCOUNTER — Encounter (HOSPITAL_COMMUNITY): Payer: Self-pay | Admitting: Psychiatry

## 2013-12-03 ENCOUNTER — Encounter (INDEPENDENT_AMBULATORY_CARE_PROVIDER_SITE_OTHER): Payer: Self-pay

## 2013-12-03 ENCOUNTER — Ambulatory Visit (INDEPENDENT_AMBULATORY_CARE_PROVIDER_SITE_OTHER): Payer: Medicare Other | Admitting: Psychiatry

## 2013-12-03 VITALS — BP 122/71 | HR 84 | Wt 264.0 lb

## 2013-12-03 DIAGNOSIS — F332 Major depressive disorder, recurrent severe without psychotic features: Secondary | ICD-10-CM

## 2013-12-03 MED ORDER — LITHIUM CARBONATE ER 300 MG PO TBCR: 300.0000 mg | EXTENDED_RELEASE_TABLET | Freq: Two times a day (BID) | ORAL | Status: DC

## 2013-12-03 MED ORDER — PAROXETINE HCL 30 MG PO TABS: 30.0000 mg | ORAL_TABLET | ORAL | Status: DC

## 2013-12-03 MED ORDER — BUPROPION HCL 100 MG PO TABS: 200.0000 mg | ORAL_TABLET | Freq: Two times a day (BID) | ORAL | Status: DC

## 2013-12-03 NOTE — Progress Notes (Signed)
[place] Follow-up Outpatient Visit  Julie Powell 12-05-1960   Date: 12/03/2013  Chief Complaint   Patient presents with   .   Follow-up     HPI Comments: Ms. Beed is a 53  Y/O woman with a past psychiatric history Major Depressive Disorder, severe, recurrent, and Social Phobia.  The patient is referred for psychiatric services for  medication management.   . Location: She reports she has had some episodes of depression over the past month.  . Quality: The patient the reports her son has been doing well. The patient reports her son is doing better. The patient reports that she is still taking her medications and denies any side effects.  In the area of affective symptoms, patient appears mildly anxious. Patient denies current suicidal ideation, intent, or plan. She reports no further suicidal ideation for several months now. Patient denies current homicidal ideation, intent, or plan. Patient denies auditory hallucinations. Patient denies visual hallucinations. Patient denies symptoms of paranoia. Patient states sleep is good with 6-7 hours. Appetite is good. Energy level is fair.  Patient endorses no further improvement in symptoms of anhedonia, but has not taken many efforts to improve her life. She has started text messages. She states she continues to experience some irritability. Patient reports feelings of hopelessness, helplessness, and guilt.   . Severity: Depression: 5/10 (0=Very depressed; 5=Neutral; 10=Very Happy)-  Anxiety- 8/10 (0=no anxiety; 5= moderate/tolerable anxiety; 10= panic attacks)  . Duration: Since age 35, had a suicide attempt at age 70.   . Timing: Worse in the evenings   . Context: Interactions with son  . Modifying factors: Patient   . Associated signs and symptoms: Denies any recent episodes consistent with mania, particularly decreased need for sleep with increased energy, grandiosity, impulsivity, hyperverbal and pressured  speech, or increased productivity. The patient denies symptoms consistent with psychosis particularly auditory or visual hallucinations, thought broadcasting/insertion/withdrawal, or ideas of reference. The patient endorses excessive worry but will still remove herself from situations prior to getting to experiencing physical symptoms or panic attacks. However, in doing so she will isolate herself from friends, family or any potentially positive interaction. She reports a history of trauma and symptoms consistent with PTSD including hypervigilance, feelings of numbness or inability to connect with others, and she endorses avoidance of friends and family, though she has been able to contact. She endorse avoidance of social interaction as a means of coping with anxiety.    Traumatic Brain Injury: No none   Review of Systems:  Constitutional: Negative for unexpected weight change.  Respiratory: Negative for chest tightness, but has some exertional dyspnea. Cardiovascular:  Negative for chest pain and palpitations.  GIT: Negative nausea, vomitting, constipation, diarrhea. Neurological: Negative for dizziness, seizures, syncope, and headaches.    Filed Vitals:   12/03/13 1125  BP: 122/71  Pulse: 84  Weight: 264 lb (119.75 kg)    Physical Exam  Constitutional: She appears well-developed and well-nourished. No distress.  Skin: She is not diaphoretic.  No tremors of hands noted while patient was interviewed.  Musculoskeletal: Gait & Station: normal Patient leans: N/A   Past Psychiatric History: Reviewed   Diagnosis: Major Depressive Disorder, recurrent, severe   Hospitalizations: Several since age 83   Outpatient Care: Since   Substance Abuse Care: none   Self-Mutilation: Patient denies   Suicidal Attempts: Three attempts   Violent Behaviors: Patient denies.    Past Medical History: Reviewed  Past Medical History  Diagnosis Date  . Hypertension   .  Bipolar 1 disorder 2010    ect   . Anxiety   . Asthma   . Fibroids   . Plantar fasciitis 2013  . Sciatica     RT hip   History of Loss of Consciousness: No  Seizure History: No  Cardiac History: Yes-HTN  Allergies: No Known Allergies   Current Medications: Reviewed   Current Outpatient Prescriptions on File Prior to Visit  Medication Sig Dispense Refill  . albuterol (PROVENTIL HFA;VENTOLIN HFA) 108 (90 BASE) MCG/ACT inhaler Inhale 2 puffs into the lungs every 6 (six) hours as needed.      Marland Kitchen buPROPion (WELLBUTRIN) 100 MG tablet Take 2 tablets (200 mg total) by mouth 2 (two) times daily.  360 tablet  1  . Cyanocobalamin (VITAMIN B-12) 5000 MCG SUBL Place 5,000 mcg under the tongue daily.      . diclofenac (VOLTAREN) 75 MG EC tablet Take 75 mg by mouth Twice daily.      . Flaxseed, Linseed, (FLAX SEED OIL PO) Take by mouth.      Marland Kitchen lisinopril-hydrochlorothiazide (PRINZIDE,ZESTORETIC) 10-12.5 MG per tablet Take 10-12.5 mg by mouth Daily.      Marland Kitchen lithium carbonate (LITHOBID) 300 MG CR tablet Take 1 tablet (300 mg total) by mouth 2 (two) times daily.  180 tablet  1  . Multiple Vitamin (MULTIVITAMIN) tablet Take 1 tablet by mouth daily.      Marland Kitchen PARoxetine (PAXIL) 30 MG tablet Take 1 tablet (30 mg total) by mouth every morning.  90 tablet  1   No current facility-administered medications on file prior to visit.   Previous Psychotropic Medications: Reviewed  Medication   Bupropion   Paxil   Lithium   Lamictal-possible rash   Depakote-"spacey"   Prozac-choking sensation   Geodon-didn't work.   Aripiprazole-didn't want to take.    Substance Abuse History in the last 12 months:   Alcohol: Patient denies.  Tobacco: Patient denies.  Illicit Drugs: Patient denies.   Blackouts: No   Family History: Reviewed  Family History   Problem  Relation  Age of Onset   .  Adopted: Yes   .  Aneurysm  Father     Psychiatric Specialty Exam:  Objective: Appearance: Well Groomed   Eye Contact:: Good   Speech: Clear and  Coherent and Normal Rate   Volume: Normal   Mood: "not overjoyed not down in the dumps"   Affect: Appropriate, Congruent and Restricted   Thought Process: Goal Directed, Logical and Loose   Orientation: Full   Thought Content: WDL   Suicidal Thoughts: Patient denies   Homicidal Thoughts: Patient denies   Judgement: Fair   Insight: Fair   Psychomotor Activity: Normal   Akathisia: No   Handed: Right   Memory: Immediate 3/3, Recent: 1/3   AIMS (if indicated): Not indicated at this time.   Assets: Communication Skills  Desire for Improvement    Assessment:  AXIS I: Major Depressive Disorder, severe, recurrent, and Social Phobia   Treatment Plan/Recommendations:   Plan of Care:  PLAN:  1. Affirm with the patient that the medications are taken as ordered. Patient  expressed understanding of how their medications were to be used.    Laboratory:   Will repeat BUN/Creatinine and Lithium every three months.   Psychotherapy: Therapy: brief supportive therapy provided. Discussed psychosocial stressors. Again, encouraged patient to continue spending time daily making changes to 4 important areas of her life. More than 50% of the visit was spent on individual therapy.  Medications:  Continue the following psychiatric medications as written prior to this appointment with the following changes::  a) Will continue Lithium Carbonate 300 mg to one capsule in the morning and one capsules at bedtime. Will consider increase in dose based on Lithium levels.  b)  Bupropion 200 mg BID- No change in dose at this time.  c) Paroxetine-30 mg-take one tablet for one month. Offered increase of medications, patient declined at this time, however will increase to 40 mg in the interval between now and her next visit if patient has worsening of anxiety. No change in dose at this time. D) As noted in chart.   -Risks and benefits, side effects and alternatives discussed with patient, she was given an opportunity  to ask questions about her medication, illness, and treatment. All current psychiatric medications have been reviewed and discussed with the patient and adjusted as clinically appropriate. The patient has been provided an accurate and updated list of the medications being now prescribed.   Routine PRN Medications:  Negative  Consultations: The patient was encouraged to keep all PCP and specialty clinic appointments.   Safety Concerns:   Patient told to call clinic if any problems occur. Patient advised to go to  ER  if she should develop SI/HI, side effects, or if symptoms worsen. Has crisis numbers to call if needed.    Other:   8. Patient was instructed to return to clinic in 1 months.  9. The patient was advised to call and cancel their mental health appointment within 24 hours of the appointment, if they are unable to keep the appointment, as well as the three no show and termination from clinic policy. 10. The patient expressed understanding of the plan and agrees with the above.  Time Spent: 30 minutes Coralyn Helling, M.D.  12/03/2013 11:19 AM

## 2013-12-04 LAB — LITHIUM LEVEL: Lithium Lvl: 0.9 mEq/L (ref 0.80–1.40)

## 2013-12-12 DIAGNOSIS — R1013 Epigastric pain: Secondary | ICD-10-CM | POA: Diagnosis not present

## 2013-12-12 DIAGNOSIS — K5900 Constipation, unspecified: Secondary | ICD-10-CM | POA: Diagnosis not present

## 2013-12-12 DIAGNOSIS — K5909 Other constipation: Secondary | ICD-10-CM | POA: Diagnosis not present

## 2014-01-12 DIAGNOSIS — M549 Dorsalgia, unspecified: Secondary | ICD-10-CM | POA: Diagnosis not present

## 2014-01-12 DIAGNOSIS — R109 Unspecified abdominal pain: Secondary | ICD-10-CM | POA: Diagnosis not present

## 2014-01-26 DIAGNOSIS — K5900 Constipation, unspecified: Secondary | ICD-10-CM | POA: Diagnosis not present

## 2014-01-26 DIAGNOSIS — R1013 Epigastric pain: Secondary | ICD-10-CM | POA: Diagnosis not present

## 2014-01-28 ENCOUNTER — Ambulatory Visit (INDEPENDENT_AMBULATORY_CARE_PROVIDER_SITE_OTHER): Payer: Medicare Other | Admitting: Psychiatry

## 2014-01-28 ENCOUNTER — Encounter (HOSPITAL_COMMUNITY): Payer: Self-pay | Admitting: Psychiatry

## 2014-01-28 VITALS — BP 121/72 | HR 72 | Wt 264.0 lb

## 2014-01-28 DIAGNOSIS — F4010 Social phobia, unspecified: Secondary | ICD-10-CM | POA: Diagnosis not present

## 2014-01-28 DIAGNOSIS — F332 Major depressive disorder, recurrent severe without psychotic features: Secondary | ICD-10-CM

## 2014-01-28 MED ORDER — PAROXETINE HCL 30 MG PO TABS: 30.0000 mg | ORAL_TABLET | ORAL | Status: DC

## 2014-01-28 MED ORDER — LITHIUM CARBONATE ER 300 MG PO TBCR: 300.0000 mg | EXTENDED_RELEASE_TABLET | Freq: Two times a day (BID) | ORAL | Status: DC

## 2014-01-28 MED ORDER — BUPROPION HCL 100 MG PO TABS: 200.0000 mg | ORAL_TABLET | Freq: Two times a day (BID) | ORAL | Status: DC

## 2014-01-28 NOTE — Progress Notes (Signed)
[facility] Follow-up Outpatient Visit  Julie Powell 02-26-1961   Date: 01/28/2014 Chief Complaint  Patient presents with  . Follow-up    HPI Comments: Julie Powell is a 53  Y/O woman with a past psychiatric history Major Depressive Disorder, severe, recurrent, and Social Phobia.  The patient is referred for psychiatric services for  medication management.   . Location: She reports she her mood remains unchanged.  . Quality: The patient the reports her son has been behaving more responsible and has been employed.  The patient continues to drink alcohol daily. The patient reports that she is still taking her medications and denies any side effects.  In the area of affective symptoms, patient appears mildly anxious. Patient denies current suicidal ideation, intent, or plan. She reports no further suicidal ideation for several months now. Patient denies current homicidal ideation, intent, or plan. Patient denies auditory hallucinations. Patient denies visual hallucinations. Patient denies symptoms of paranoia. Patient states sleep is good with 8 hours. Appetite is good. Energy level is low.  Patient endorses no further improvement in symptoms of anhedonia, but has not taken many efforts to improve her life. She reports she has reached out to family and visited a cousin.  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 She states she continues to experience some irritability. Patient reports feelings of hopelessness, helplessness, and guilt.   . Severity: Depression: 4/10 (0=Very depressed; 5=Neutral; 10=Very Happy)-  Anxiety- 5/10 (0=no anxiety; 5= moderate/tolerable anxiety; 10= panic attacks)  . Duration: Since age 78, had a suicide attempt at age 20.   . Timing: Worse in the evenings   . Context: She perceives herself failure. Low self esteem.  . Modifying factors: Patient   . Associated signs and symptoms: Denies any recent episodes consistent  with mania, particularly decreased need for sleep with increased energy, grandiosity, impulsivity, hyperverbal and pressured speech, or increased productivity. The patient denies symptoms consistent with psychosis particularly auditory or visual hallucinations, thought broadcasting/insertion/withdrawal, or ideas of reference. The patient endorses excessive worry but will still remove herself from situations prior to getting to experiencing physical symptoms or panic attacks. However, in doing so she will isolate herself from friends, family or any potentially positive interaction. She reports a history of trauma and symptoms consistent with PTSD including hypervigilance, feelings of numbness or inability to connect with others, and she endorses avoidance of friends and family, though she has been able to contact. She endorse avoidance of social interaction as a means of coping with anxiety.    Traumatic Brain Injury: No none   Review of Systems  Constitutional: Negative for fever, chills and diaphoresis.  Respiratory: Negative for cough, hemoptysis, sputum production and shortness of breath.   Cardiovascular: Negative for chest pain, palpitations and leg swelling.  Gastrointestinal: Negative for heartburn, nausea, vomiting, abdominal pain, diarrhea and constipation.  Genitourinary: Negative for dysuria, urgency and frequency.  Musculoskeletal: Positive for joint pain (Left sided.).  Psychiatric/Behavioral: Positive for substance abuse. Negative for depression, suicidal ideas and hallucinations. The patient is not nervous/anxious and does not have insomnia.    Filed Vitals:   01/28/14 1118  BP: 121/72  Pulse: 72  Weight: 264 lb (119.75 kg)   Physical Exam  Constitutional: She appears well-developed and well-nourished. No distress.  Skin: She is not diaphoretic.  No tremors of hands noted while patient was interviewed.  Musculoskeletal: Gait & Station: normal Patient leans: N/A   Past  Psychiatric History: Reviewed  Diagnosis: Major Depressive Disorder, recurrent, severe   Hospitalizations: Several since age 21   Outpatient Care: Since   Substance Abuse Care: none   Self-Mutilation: Patient denies   Suicidal Attempts: Three attempts   Violent Behaviors: Patient denies.    Past Medical History: Reviewed  Past Medical History  Diagnosis Date  . Hypertension   . Bipolar 1 disorder 2010    ect  . Anxiety   . Asthma   . Fibroids   . Plantar fasciitis 2013  . Sciatica     RT hip   History of Loss of Consciousness: No  Seizure History: No  Cardiac History: Yes-HTN  Allergies: No Known Allergies   Current Medications: Reviewed   Current Outpatient Prescriptions on File Prior to Visit  Medication Sig Dispense Refill  . albuterol (PROVENTIL HFA;VENTOLIN HFA) 108 (90 BASE) MCG/ACT inhaler Inhale 2 puffs into the lungs every 6 (six) hours as needed.      Marland Kitchen buPROPion (WELLBUTRIN) 100 MG tablet Take 2 tablets (200 mg total) by mouth 2 (two) times daily.  360 tablet  1  . cholecalciferol (VITAMIN D) 1000 UNITS tablet Take 5,000 Units by mouth daily.      . Cyanocobalamin (VITAMIN B-12) 5000 MCG SUBL Place 5,000 mcg under the tongue daily.      . diclofenac (VOLTAREN) 75 MG EC tablet Take 75 mg by mouth Twice daily.      . Flaxseed, Linseed, (FLAX SEED OIL PO) Take by mouth.      Marland Kitchen lisinopril-hydrochlorothiazide (PRINZIDE,ZESTORETIC) 10-12.5 MG per tablet Take 10-12.5 mg by mouth Daily.      Marland Kitchen lithium carbonate (LITHOBID) 300 MG CR tablet Take 1 tablet (300 mg total) by mouth 2 (two) times daily.  180 tablet  1  . Multiple Vitamin (MULTIVITAMIN) tablet Take 1 tablet by mouth daily.      Marland Kitchen PARoxetine (PAXIL) 30 MG tablet Take 1 tablet (30 mg total) by mouth every morning.  90 tablet  1   No current facility-administered medications on file prior to visit.   Previous Psychotropic Medications: Reviewed  Medication   Bupropion   Paxil   Lithium   Lamictal-possible  rash   Depakote-"spacey"   Prozac-choking sensation   Geodon-didn't work.   Aripiprazole-didn't want to take.    Substance Abuse History in the last 12 months:  History   Social History  . Marital Status: Divorced    Spouse Name: N/A    Number of Children: N/A  . Years of Education: N/A   Occupational History  . Not on file.   Social History Main Topics  . Smoking status: Former Smoker -- 0.50 packs/day for 10 years    Types: Cigarettes    Quit date: 11/07/1991  . Smokeless tobacco: Never Used  . Alcohol Use: 10.5 oz/week    21 drink(s) per week  . Drug Use: No     Comment: Caffeine- Carbonated beverage 16 ounces.  . Sexual Activity: No   Other Topics Concern  . Not on file   Social History Narrative  . No narrative on file     Blackouts: No   Family History: Reviewed  Family History   Problem  Relation  Age of Onset   .  Adopted: Yes   .  Aneurysm  Father     Psychiatric Specialty Exam:  Objective: Appearance: Well Groomed   Eye Contact:: Good   Speech: Clear and Coherent and Normal Rate   Volume: Normal   Mood: "I don't  feel great but I don't feel bad"   Affect: Appropriate, Congruent and Restricted   Thought Process: Goal Directed, Logical and Loose   Orientation: Full   Thought Content: WDL   Suicidal Thoughts: Patient denies   Homicidal Thoughts: Patient denies   Judgement: Fair   Insight: Fair   Psychomotor Activity: Normal   Akathisia: No   Handed: Right   Memory: Immediate 3/3, Recent: 1/3   [place] of knowledge-average to above average.  AIMS (if indicated): Not indicated at this time.   Assets: Communication Skills  Desire for Improvement    Assessment:  AXIS I: Major Depressive Disorder, severe, recurrent--stable Social Phobia-slightly improving   Treatment Plan/Recommendations:   Plan of Care:  PLAN:  1. Affirm with the patient that the medications are taken as ordered. Patient  expressed understanding of how  their medications were to be used.    Laboratory:   Will repeat BUN/Creatinine and Lithium every three months.   Psychotherapy: Therapy: brief supportive therapy provided. Discussed psychosocial stressors. Again, encouraged patient to continue spending time daily making changes to 4 important areas of her life. More than 50% of the visit was spent on individual therapy.   Medications:  Continue the following psychiatric medications as written prior to this appointment with the following changes::  a) Will continue Lithium Carbonate 300 mg to one capsule in the morning and one capsules at bedtime. Will consider increase in dose based on Lithium levels.  b)  Bupropion 200 mg BID- No change in dose at this time.  c) Paroxetine-30 mg-take one tablet for one month. Offered increase of medications, patient declined at this time. D) Advised a decrease of alcohol intake.  -Risks and benefits, side effects and alternatives discussed with patient, she was given an opportunity to ask questions about her medication, illness, and treatment. All current psychiatric medications have been reviewed and discussed with the patient and adjusted as clinically appropriate. The patient has been provided an accurate and updated list of the medications being now prescribed.   Routine PRN Medications:  Negative  Consultations: The patient was encouraged to keep all PCP and specialty clinic appointments.   Safety Concerns:   Patient told to call clinic if any problems occur. Patient advised to go to  ER  if she should develop SI/HI, side effects, or if symptoms worsen. Has crisis numbers to call if needed.    Other:   8. Patient was instructed to return to clinic in 1 months.  9. The patient was advised to call and cancel their mental health appointment within 24 hours of the appointment, if they are unable to keep the appointment, as well as the three no show and termination from clinic policy. 10. The patient expressed  understanding of the plan and agrees with the above. 11. Patient informed that April 15th, 2015 would be my last day at this clinic.   Time Spent: 30 minutes Coralyn Helling, M.D.  01/28/2014 11:16 AM

## 2014-02-17 ENCOUNTER — Telehealth (HOSPITAL_COMMUNITY): Payer: Self-pay

## 2014-02-18 NOTE — Telephone Encounter (Signed)
Called patient, she requires insurance paperwork. She will require an appointment for the same.

## 2014-02-21 ENCOUNTER — Encounter (HOSPITAL_BASED_OUTPATIENT_CLINIC_OR_DEPARTMENT_OTHER): Payer: Self-pay | Admitting: Emergency Medicine

## 2014-02-21 ENCOUNTER — Emergency Department (HOSPITAL_BASED_OUTPATIENT_CLINIC_OR_DEPARTMENT_OTHER)
Admission: EM | Admit: 2014-02-21 | Discharge: 2014-02-21 | Disposition: A | Discharge status: Home / Self Care | Payer: Medicare Other | Attending: Emergency Medicine | Admitting: Emergency Medicine

## 2014-02-21 ENCOUNTER — Emergency Department (HOSPITAL_BASED_OUTPATIENT_CLINIC_OR_DEPARTMENT_OTHER): Payer: Medicare Other

## 2014-02-21 DIAGNOSIS — M5430 Sciatica, unspecified side: Secondary | ICD-10-CM | POA: Insufficient documentation

## 2014-02-21 DIAGNOSIS — F411 Generalized anxiety disorder: Secondary | ICD-10-CM | POA: Insufficient documentation

## 2014-02-21 DIAGNOSIS — Z87891 Personal history of nicotine dependence: Secondary | ICD-10-CM | POA: Diagnosis not present

## 2014-02-21 DIAGNOSIS — I10 Essential (primary) hypertension: Secondary | ICD-10-CM | POA: Diagnosis not present

## 2014-02-21 DIAGNOSIS — Z791 Long term (current) use of non-steroidal anti-inflammatories (NSAID): Secondary | ICD-10-CM | POA: Insufficient documentation

## 2014-02-21 DIAGNOSIS — Z79899 Other long term (current) drug therapy: Secondary | ICD-10-CM | POA: Diagnosis not present

## 2014-02-21 DIAGNOSIS — R1084 Generalized abdominal pain: Secondary | ICD-10-CM | POA: Insufficient documentation

## 2014-02-21 DIAGNOSIS — I880 Nonspecific mesenteric lymphadenitis: Secondary | ICD-10-CM | POA: Diagnosis not present

## 2014-02-21 DIAGNOSIS — D259 Leiomyoma of uterus, unspecified: Secondary | ICD-10-CM | POA: Diagnosis not present

## 2014-02-21 DIAGNOSIS — F319 Bipolar disorder, unspecified: Secondary | ICD-10-CM | POA: Insufficient documentation

## 2014-02-21 DIAGNOSIS — J45909 Unspecified asthma, uncomplicated: Secondary | ICD-10-CM | POA: Insufficient documentation

## 2014-02-21 DIAGNOSIS — R109 Unspecified abdominal pain: Secondary | ICD-10-CM

## 2014-02-21 LAB — LIPASE, BLOOD: Lipase: 14 U/L (ref 11–59)

## 2014-02-21 LAB — URINALYSIS, ROUTINE W REFLEX MICROSCOPIC
Bilirubin Urine: NEGATIVE
Glucose, UA: NEGATIVE mg/dL
Hgb urine dipstick: NEGATIVE
Ketones, ur: NEGATIVE mg/dL
Leukocytes, UA: NEGATIVE
Nitrite: NEGATIVE
Protein, ur: NEGATIVE mg/dL
Specific Gravity, Urine: 1.022 (ref 1.005–1.030)
Urobilinogen, UA: 0.2 mg/dL (ref 0.0–1.0)
pH: 5.5 (ref 5.0–8.0)

## 2014-02-21 LAB — CBC WITH DIFFERENTIAL/PLATELET
Basophils Absolute: 0 10*3/uL (ref 0.0–0.1)
Basophils Relative: 0 % (ref 0–1)
Eosinophils Absolute: 0.2 10*3/uL (ref 0.0–0.7)
Eosinophils Relative: 5 % (ref 0–5)
HCT: 37.5 % (ref 36.0–46.0)
Hemoglobin: 12.4 g/dL (ref 12.0–15.0)
Lymphocytes Relative: 33 % (ref 12–46)
Lymphs Abs: 1.6 10*3/uL (ref 0.7–4.0)
MCH: 31.6 pg (ref 26.0–34.0)
MCHC: 33.1 g/dL (ref 30.0–36.0)
MCV: 95.7 fL (ref 78.0–100.0)
Monocytes Absolute: 0.5 10*3/uL (ref 0.1–1.0)
Monocytes Relative: 10 % (ref 3–12)
Neutro Abs: 2.4 10*3/uL (ref 1.7–7.7)
Neutrophils Relative %: 51 % (ref 43–77)
Platelets: 219 10*3/uL (ref 150–400)
RBC: 3.92 MIL/uL (ref 3.87–5.11)
RDW: 12.8 % (ref 11.5–15.5)
WBC: 4.7 10*3/uL (ref 4.0–10.5)

## 2014-02-21 LAB — COMPREHENSIVE METABOLIC PANEL
ALT: 24 U/L (ref 0–35)
AST: 27 U/L (ref 0–37)
Albumin: 4.2 g/dL (ref 3.5–5.2)
Alkaline Phosphatase: 106 U/L (ref 39–117)
BUN: 23 mg/dL (ref 6–23)
CO2: 26 mEq/L (ref 19–32)
Calcium: 10.1 mg/dL (ref 8.4–10.5)
Chloride: 101 mEq/L (ref 96–112)
Creatinine, Ser: 1 mg/dL (ref 0.50–1.10)
GFR calc Af Amer: 75 mL/min — ABNORMAL LOW (ref >90)
GFR calc non Af Amer: 65 mL/min — ABNORMAL LOW (ref >90)
Glucose, Bld: 103 mg/dL — ABNORMAL HIGH (ref 70–99)
Potassium: 4.3 mEq/L (ref 3.7–5.3)
Sodium: 140 mEq/L (ref 137–147)
Total Bilirubin: 0.2 mg/dL — ABNORMAL LOW (ref 0.3–1.2)
Total Protein: 7.4 g/dL (ref 6.0–8.3)

## 2014-02-21 MED ORDER — ONDANSETRON 4 MG PO TBDP: 4.0000 mg | ORAL_TABLET | Freq: Three times a day (TID) | ORAL | Status: DC | PRN

## 2014-02-21 MED ORDER — IOHEXOL 300 MG/ML  SOLN
100.0000 mL | Freq: Once | INTRAMUSCULAR | Status: AC | PRN
  Administered 2014-02-21: 100 mL via INTRAVENOUS

## 2014-02-21 MED ORDER — ONDANSETRON HCL 4 MG/2ML IJ SOLN
4.0000 mg | Freq: Once | INTRAMUSCULAR | Status: AC
  Administered 2014-02-21: 4 mg via INTRAMUSCULAR
  Filled 2014-02-21: qty 2

## 2014-02-21 MED ORDER — SODIUM CHLORIDE 0.9 % IV SOLN
Freq: Once | INTRAVENOUS | Status: AC
  Administered 2014-02-21: 20:00:00 via INTRAVENOUS

## 2014-02-21 MED ORDER — HYDROMORPHONE HCL PF 1 MG/ML IJ SOLN
1.0000 mg | Freq: Once | INTRAMUSCULAR | Status: AC
  Administered 2014-02-21: 1 mg via INTRAVENOUS
  Filled 2014-02-21: qty 1

## 2014-02-21 MED ORDER — OXYCODONE-ACETAMINOPHEN 5-325 MG PO TABS
2.0000 | ORAL_TABLET | ORAL | Status: DC | PRN
Start: 2014-02-21 — End: 2014-08-12

## 2014-02-21 MED ORDER — IOHEXOL 300 MG/ML  SOLN
50.0000 mL | Freq: Once | INTRAMUSCULAR | Status: AC | PRN
  Administered 2014-02-21: 50 mL via ORAL

## 2014-02-21 NOTE — ED Notes (Signed)
abd pain x 1 hour- has been in treatment for same and has GI doctor following her

## 2014-02-21 NOTE — ED Notes (Signed)
Pt reports extensive history with GI. Reports pain is similar to when she had gallstones.  Was told by GI to come to ED to be evaluated when having a "flare up"

## 2014-02-21 NOTE — Discharge Instructions (Signed)
Abdominal Pain, Women °Abdominal (stomach, pelvic, or belly) pain can be caused by many things. It is important to tell your doctor: °· The location of the pain. °· Does it come and go or is it present all the time? °· Are there things that start the pain (eating certain foods, exercise)? °· Are there other symptoms associated with the pain (fever, nausea, vomiting, diarrhea)? °All of this is helpful to know when trying to find the cause of the pain. °CAUSES  °· Stomach: virus or bacteria infection, or ulcer. °· Intestine: appendicitis (inflamed appendix), regional ileitis (Crohn's disease), ulcerative colitis (inflamed colon), irritable bowel syndrome, diverticulitis (inflamed diverticulum of the colon), or cancer of the stomach or intestine. °· Gallbladder disease or stones in the gallbladder. °· Kidney disease, kidney stones, or infection. °· Pancreas infection or cancer. °· Fibromyalgia (pain disorder). °· Diseases of the female organs: °· Uterus: fibroid (non-cancerous) tumors or infection. °· Fallopian tubes: infection or tubal pregnancy. °· Ovary: cysts or tumors. °· Pelvic adhesions (scar tissue). °· Endometriosis (uterus lining tissue growing in the pelvis and on the pelvic organs). °· Pelvic congestion syndrome (female organs filling up with blood just before the menstrual period). °· Pain with the menstrual period. °· Pain with ovulation (producing an egg). °· Pain with an IUD (intrauterine device, birth control) in the uterus. °· Cancer of the female organs. °· Functional pain (pain not caused by a disease, may improve without treatment). °· Psychological pain. °· Depression. °DIAGNOSIS  °Your doctor will decide the seriousness of your pain by doing an examination. °· Blood tests. °· X-rays. °· Ultrasound. °· CT scan (computed tomography, special type of X-ray). °· MRI (magnetic resonance imaging). °· Cultures, for infection. °· Barium enema (dye inserted in the large intestine, to better view it with  X-rays). °· Colonoscopy (looking in intestine with a lighted tube). °· Laparoscopy (minor surgery, looking in abdomen with a lighted tube). °· Major abdominal exploratory surgery (looking in abdomen with a large incision). °TREATMENT  °The treatment will depend on the cause of the pain.  °· Many cases can be observed and treated at home. °· Over-the-counter medicines recommended by your caregiver. °· Prescription medicine. °· Antibiotics, for infection. °· Birth control pills, for painful periods or for ovulation pain. °· Hormone treatment, for endometriosis. °· Nerve blocking injections. °· Physical therapy. °· Antidepressants. °· Counseling with a psychologist or psychiatrist. °· Minor or major surgery. °HOME CARE INSTRUCTIONS  °· Do not take laxatives, unless directed by your caregiver. °· Take over-the-counter pain medicine only if ordered by your caregiver. Do not take aspirin because it can cause an upset stomach or bleeding. °· Try a clear liquid diet (broth or water) as ordered by your caregiver. Slowly move to a bland diet, as tolerated, if the pain is related to the stomach or intestine. °· Have a thermometer and take your temperature several times a day, and record it. °· Bed rest and sleep, if it helps the pain. °· Avoid sexual intercourse, if it causes pain. °· Avoid stressful situations. °· Keep your follow-up appointments and tests, as your caregiver orders. °· If the pain does not go away with medicine or surgery, you may try: °· Acupuncture. °· Relaxation exercises (yoga, meditation). °· Group therapy. °· Counseling. °SEEK MEDICAL CARE IF:  °· You notice certain foods cause stomach pain. °· Your home care treatment is not helping your pain. °· You need stronger pain medicine. °· You want your IUD removed. °· You feel faint or   lightheaded.  You develop nausea and vomiting.  You develop a rash.  You are having side effects or an allergy to your medicine. SEEK IMMEDIATE MEDICAL CARE IF:   Your  pain does not go away or gets worse.  You have a fever.  Your pain is felt only in portions of the abdomen. The right side could possibly be appendicitis. The left lower portion of the abdomen could be colitis or diverticulitis.  You are passing blood in your stools (bright red or black tarry stools, with or without vomiting).  You have blood in your urine.  You develop chills, with or without a fever.  You pass out. MAKE SURE YOU:   Understand these instructions.  Will watch your condition.  Will get help right away if you are not doing well or get worse. Document Released: 08/20/2007 Document Revised: 01/15/2012 Document Reviewed: 09/09/2009 [facility] Patient Information 2014 [place], [place]. Mesenteric Adenitis Mesenteric adenitis is an inflammation of lymph nodes (glands) in the abdomen. It may appear to mimic appendicitis symptoms. It is most common in children. The cause of this may be an infection somewhere else in the body. It usually gets well without treatment but can cause problems for up to a couple weeks. SYMPTOMS  The most common problems are:  Fever.  Abdominal pain and tenderness.  Nausea, vomiting, and/or diarrhea. DIAGNOSIS  Your caregiver may have an idea what is wrong by examining you or your child. Sometimes lab work and other studies such as Ultrasonography and a CT scan of the abdomen are done.  TREATMENT  Children with mesenteric adenitis will get well without further treatment. Treatment includes rest, pain medications, and fluids. HOME CARE INSTRUCTIONS   Do not take or give laxatives unless ordered by your caregiver.  Use pain medications as directed.  Follow the diet recommended by your caregiver. SEEK IMMEDIATE MEDICAL CARE IF:   The pain does not go away or becomes severe.  An oral temperature above 102 F (38.9 C) develops.  Repeated vomiting occurs.  The pain becomes localized in the right lower quadrant of the abdomen (possibly  appendicitis).  You or your child notice bright red or black tarry stools. MAKE SURE YOU:   Understand these instructions.  Will watch your condition.  Will get help right away if you are not doing well or get worse. Document Released: 07/27/2006 Document Revised: 01/15/2012 Document Reviewed: 08/09/2006 [facility] Patient Information 2014 [place], [place]. Mesenteric Adenitis Mesenteric adenitis is an inflammation of lymph nodes (glands) in the abdomen. It may appear to mimic appendicitis symptoms. It is most common in children. The cause of this may be an infection somewhere else in the body. It usually gets well without treatment but can cause problems for up to a couple weeks. SYMPTOMS  The most common problems are:  Fever.  Abdominal pain and tenderness.  Nausea, vomiting, and/or diarrhea. DIAGNOSIS  Your caregiver may have an idea what is wrong by examining you or your child. Sometimes lab work and other studies such as Ultrasonography and a CT scan of the abdomen are done.  TREATMENT  Children with mesenteric adenitis will get well without further treatment. Treatment includes rest, pain medications, and fluids. HOME CARE INSTRUCTIONS   Do not take or give laxatives unless ordered by your caregiver.  Use pain medications as directed.  Follow the diet recommended by your caregiver. SEEK IMMEDIATE MEDICAL CARE IF:   The pain does not go away or becomes severe.  An oral temperature above 102  F (38.9 C) develops.  Repeated vomiting occurs.  The pain becomes localized in the right lower quadrant of the abdomen (possibly appendicitis).  You or your child notice bright red or black tarry stools. MAKE SURE YOU:   Understand these instructions.  Will watch your condition.  Will get help right away if you are not doing well or get worse. Document Released: 07/27/2006 Document Revised: 01/15/2012 Document Reviewed: 08/09/2006 [facility] Patient Information 2014  [place], [place].

## 2014-02-21 NOTE — ED Provider Notes (Signed)
Medical screening examination/treatment/procedure(s) were performed by non-physician practitioner and as supervising physician I was immediately available for consultation/collaboration.   EKG Interpretation None        Tanna Furry, MD 02/21/14 2344

## 2014-02-21 NOTE — ED Provider Notes (Signed)
CSN: [id number]     Arrival date & time 02/21/14  1831 History   First MD Initiated Contact with Patient 02/21/14 1857     Chief Complaint  Patient presents with  . Abdominal Pain     (Consider location/radiation/quality/duration/timing/severity/associated sxs/prior Treatment) Patient is a 53 y.o. female presenting with abdominal pain. The history is provided by the patient. No language interpreter was used.  Abdominal Pain Pain location:  Generalized Pain quality: aching and stabbing   Pain radiates to:  Back Pain severity:  Severe Onset quality:  Gradual Timing:  Constant Progression:  Worsening Chronicity:  New Context: alcohol use and previous surgery   Relieved by:  Nothing Ineffective treatments:  None tried Associated symptoms: no constipation and no fever   Risk factors: multiple surgeries    Pt has had similar episodes in the past.  Pt's Md advised her to come for a Ct scan the next time she had pain. Past Medical History  Diagnosis Date  . Hypertension   . Bipolar 1 disorder 2010    ect  . Anxiety   . Asthma   . Fibroids   . Plantar fasciitis 2013  . Sciatica     RT hip   Past Surgical History  Procedure Laterality Date  . Sp cholecystomy  1986  . Cesarean section  1995  . Gastric bypass  2003  . Cystectomy      throat   Family History  Problem Relation Age of Onset  . Adopted: Yes  . Aneurysm Father   . Alcohol abuse Father   . Alcohol abuse Brother   . Alcohol abuse Brother   . Alcohol abuse Brother   . Lung cancer Sister    History  Substance Use Topics  . Smoking status: Former Smoker -- 0.50 packs/day for 10 years    Types: Cigarettes    Quit date: 11/07/1991  . Smokeless tobacco: Never Used  . Alcohol Use: 10.5 oz/week    21 drink(s) per week   OB History   Grav Para Term Preterm Abortions TAB SAB Ect Mult Living   1 1 1       1      Review of Systems  Constitutional: Negative for fever.  Gastrointestinal: Positive for abdominal  pain. Negative for constipation.  All other systems reviewed and are negative.     Allergies  Review of patient's allergies indicates no known allergies.  Home Medications   Prior to Admission medications   Medication Sig Start Date End Date Taking? Authorizing Provider  albuterol (PROVENTIL HFA;VENTOLIN HFA) 108 (90 BASE) MCG/ACT inhaler Inhale 2 puffs into the lungs every 6 (six) hours as needed.    Historical Provider, MD  buPROPion (WELLBUTRIN) 100 MG tablet Take 2 tablets (200 mg total) by mouth 2 (two) times daily. 01/28/14   Waldon Merl, MD  cholecalciferol (VITAMIN D) 1000 UNITS tablet Take 5,000 Units by mouth daily.    Historical Provider, MD  Cyanocobalamin (VITAMIN B-12) 5000 MCG SUBL Place 5,000 mcg under the tongue daily.    Historical Provider, MD  diclofenac (VOLTAREN) 75 MG EC tablet Take 75 mg by mouth Twice daily. 08/29/12   Historical Provider, MD  dicyclomine (BENTYL) 20 MG tablet Take 20 mg by mouth 2 (two) times daily. 01/12/14   Historical Provider, MD  Flaxseed, Linseed, (FLAX SEED OIL PO) Take by mouth.    Historical Provider, MD  HYDROcodone-acetaminophen (NORCO/VICODIN) 5-325 MG per tablet Take 1 tablet by mouth daily. 01/12/14   Historical  Provider, MD  lisinopril-hydrochlorothiazide (PRINZIDE,ZESTORETIC) 10-12.5 MG per tablet Take 10-12.5 mg by mouth Daily. 03/02/12   Historical Provider, MD  lithium carbonate (LITHOBID) 300 MG CR tablet Take 1 tablet (300 mg total) by mouth 2 (two) times daily. 01/28/14   Waldon Merl, MD  Multiple Vitamin (MULTIVITAMIN) tablet Take 1 tablet by mouth daily.    Historical Provider, MD  PARoxetine (PAXIL) 30 MG tablet Take 1 tablet (30 mg total) by mouth every morning. 01/28/14   Waldon Merl, MD   BP 110/57  Pulse 76  Temp(Src) 97.7 F (36.5 C)  Resp 24  Ht 5\' 4"  (1.626 m)  Wt 260 lb (117.935 kg)  BMI 44.61 kg/m2  SpO2 100%  LMP 09/11/2013 Physical Exam  Nursing note and vitals reviewed. Constitutional: She is  oriented to person, place, and time. She appears well-developed and well-nourished.  HENT:  Head: Normocephalic and atraumatic.  Right Ear: External ear normal.  Left Ear: External ear normal.  Nose: Nose normal.  Mouth/Throat: Oropharynx is clear and moist.  Eyes: Conjunctivae and EOM are normal. Pupils are equal, round, and reactive to light.  Neck: Normal range of motion.  Cardiovascular: Normal rate and normal heart sounds.   Pulmonary/Chest: Effort normal.  Abdominal: Soft. Bowel sounds are normal. She exhibits no distension. There is tenderness.  Musculoskeletal: Normal range of motion.  Neurological: She is alert and oriented to person, place, and time.  Skin: Skin is warm.  Psychiatric: She has a normal mood and affect.    ED Course  Procedures (including critical care time) Labs Review Labs Reviewed  COMPREHENSIVE METABOLIC PANEL - Abnormal; Notable for the following:    Glucose, Bld 103 (*)    Total Bilirubin 0.2 (*)    GFR calc non Af Amer 65 (*)    GFR calc Af Amer 75 (*)    All other components within normal limits  CBC WITH DIFFERENTIAL  LIPASE, BLOOD  URINALYSIS, ROUTINE W REFLEX MICROSCOPIC    Imaging Review Ct Abdomen Pelvis W Contrast  02/21/2014   CLINICAL DATA:  Abdominal pain.  EXAM: CT ABDOMEN AND PELVIS WITH CONTRAST  TECHNIQUE: Multidetector CT imaging of the abdomen and pelvis was performed using the standard protocol following bolus administration of intravenous contrast.  CONTRAST:  36mL OMNIPAQUE IOHEXOL 300 MG/ML SOLN, 136mL OMNIPAQUE IOHEXOL 300 MG/ML SOLN  COMPARISON:  Pelvic ultrasound 05/28/2012.  FINDINGS: Lung bases are clear.  No effusions.  Heart is normal size.  Changes of prior gastric bypass in the upper abdomen. Liver, spleen, pancreas, adrenals and kidneys are unremarkable. Large and small bowel grossly unremarkable. Enlarged uterus with calcified fibroids. Urinary bladder is decompressed, grossly unremarkable. Aorta is normal caliber. No  free fluid, free air or adenopathy.  Mild haziness within the mesentery. Small scattered mesenteric lymph nodes. This can be seen with mesenteric panniculitis/adenitis.  No acute bony abnormality.  IMPRESSION: Prior gastric bypass.  Mild haziness in the mesenteric which can be seen with mesenteric panniculitis/adenitis.  Enlarged, fibroid uterus.   Electronically Signed   By: Rolm Baptise M.D.   On: 02/21/2014 22:37     EKG Interpretation None      MDM   Final diagnoses:  Abdominal pain  Mesenteric adenitis    Pt given Iv dilaudid and zofran.  Labs normal.   Ct scan shows adenitis, other wise normal.   Pt given rx for zofran and percocet.   i advised her to follow up with her GI doctor for evaluation.  Stephenson, PA-C 02/21/14 2304

## 2014-02-24 DIAGNOSIS — R110 Nausea: Secondary | ICD-10-CM | POA: Diagnosis not present

## 2014-02-24 DIAGNOSIS — R1013 Epigastric pain: Secondary | ICD-10-CM | POA: Diagnosis not present

## 2014-02-25 DIAGNOSIS — R112 Nausea with vomiting, unspecified: Secondary | ICD-10-CM | POA: Diagnosis not present

## 2014-02-25 DIAGNOSIS — R109 Unspecified abdominal pain: Secondary | ICD-10-CM | POA: Diagnosis not present

## 2014-02-25 DIAGNOSIS — R1013 Epigastric pain: Secondary | ICD-10-CM | POA: Diagnosis not present

## 2014-03-03 DIAGNOSIS — Z9884 Bariatric surgery status: Secondary | ICD-10-CM | POA: Diagnosis not present

## 2014-03-03 DIAGNOSIS — R1013 Epigastric pain: Secondary | ICD-10-CM | POA: Diagnosis not present

## 2014-07-09 DIAGNOSIS — R1013 Epigastric pain: Secondary | ICD-10-CM | POA: Diagnosis not present

## 2014-07-09 DIAGNOSIS — R112 Nausea with vomiting, unspecified: Secondary | ICD-10-CM | POA: Diagnosis not present

## 2014-08-06 ENCOUNTER — Emergency Department (HOSPITAL_COMMUNITY)
Admission: EM | Admit: 2014-08-06 | Discharge: 2014-08-06 | Disposition: A | Discharge status: Home / Self Care | Payer: Medicare Other | Attending: Emergency Medicine | Admitting: Emergency Medicine

## 2014-08-06 ENCOUNTER — Encounter (HOSPITAL_COMMUNITY): Payer: Self-pay | Admitting: Emergency Medicine

## 2014-08-06 DIAGNOSIS — I10 Essential (primary) hypertension: Secondary | ICD-10-CM | POA: Diagnosis not present

## 2014-08-06 DIAGNOSIS — Z791 Long term (current) use of non-steroidal anti-inflammatories (NSAID): Secondary | ICD-10-CM | POA: Insufficient documentation

## 2014-08-06 DIAGNOSIS — E669 Obesity, unspecified: Secondary | ICD-10-CM | POA: Diagnosis not present

## 2014-08-06 DIAGNOSIS — M5416 Radiculopathy, lumbar region: Secondary | ICD-10-CM | POA: Diagnosis not present

## 2014-08-06 DIAGNOSIS — Z79899 Other long term (current) drug therapy: Secondary | ICD-10-CM | POA: Insufficient documentation

## 2014-08-06 DIAGNOSIS — M5442 Lumbago with sciatica, left side: Secondary | ICD-10-CM | POA: Diagnosis not present

## 2014-08-06 DIAGNOSIS — F419 Anxiety disorder, unspecified: Secondary | ICD-10-CM | POA: Insufficient documentation

## 2014-08-06 DIAGNOSIS — Z87891 Personal history of nicotine dependence: Secondary | ICD-10-CM | POA: Diagnosis not present

## 2014-08-06 DIAGNOSIS — Z8639 Personal history of other endocrine, nutritional and metabolic disease: Secondary | ICD-10-CM | POA: Insufficient documentation

## 2014-08-06 DIAGNOSIS — J45909 Unspecified asthma, uncomplicated: Secondary | ICD-10-CM | POA: Diagnosis not present

## 2014-08-06 DIAGNOSIS — M545 Low back pain: Secondary | ICD-10-CM | POA: Diagnosis present

## 2014-08-06 DIAGNOSIS — M25451 Effusion, right hip: Secondary | ICD-10-CM | POA: Diagnosis not present

## 2014-08-06 DIAGNOSIS — R110 Nausea: Secondary | ICD-10-CM | POA: Diagnosis not present

## 2014-08-06 DIAGNOSIS — F319 Bipolar disorder, unspecified: Secondary | ICD-10-CM | POA: Diagnosis not present

## 2014-08-06 MED ORDER — METOCLOPRAMIDE HCL 10 MG PO TABS: 10.0000 mg | ORAL_TABLET | Freq: Four times a day (QID) | ORAL | Status: DC | PRN

## 2014-08-06 MED ORDER — OXYCODONE-ACETAMINOPHEN 5-325 MG PO TABS: 1.0000 | ORAL_TABLET | Freq: Four times a day (QID) | ORAL | Status: DC | PRN

## 2014-08-06 MED ORDER — OXYCODONE-ACETAMINOPHEN 5-325 MG PO TABS
1.0000 | ORAL_TABLET | Freq: Once | ORAL | Status: AC
  Administered 2014-08-06: 1 via ORAL
  Filled 2014-08-06: qty 1

## 2014-08-06 NOTE — ED Notes (Addendum)
Pt reports R lower back pain radiating across front of thigh that began several months ago that got worse in the past week. Pt states pain has progressed to the point it hurts too much to walk. Pt reports when pain first started it was relieved by bending forward, but now that movement makes no difference. No recent injuries or back strain. Has been diagnosed with sciatica on the L side before. No urinary symptoms.

## 2014-08-06 NOTE — Discharge Instructions (Signed)
Back Exercises Keep your scheduled appointment with your primary care physician tomorrow Back exercises help treat and prevent back injuries. The goal of back exercises is to increase the strength of your abdominal and back muscles and the flexibility of your back. These exercises should be started when you no longer have back pain. Back exercises include:  Pelvic Tilt. Lie on your back with your knees bent. Tilt your pelvis until the lower part of your back is against the floor. Hold this position 5 to 10 sec and repeat 5 to 10 times.  Knee to Chest. Pull first 1 knee up against your chest and hold for 20 to 30 seconds, repeat this with the other knee, and then both knees. This may be done with the other leg straight or bent, whichever feels better.  Sit-Ups or Curl-Ups. Bend your knees 90 degrees. Start with tilting your pelvis, and do a partial, slow sit-up, lifting your trunk only 30 to 45 degrees off the floor. Take at least 2 to 3 seconds for each sit-up. Do not do sit-ups with your knees out straight. If partial sit-ups are difficult, simply do the above but with only tightening your abdominal muscles and holding it as directed.  Hip-Lift. Lie on your back with your knees flexed 90 degrees. Push down with your feet and shoulders as you raise your hips a couple inches off the floor; hold for 10 seconds, repeat 5 to 10 times.  Back arches. Lie on your stomach, propping yourself up on bent elbows. Slowly press on your hands, causing an arch in your low back. Repeat 3 to 5 times. Any initial stiffness and discomfort should lessen with repetition over time.  Shoulder-Lifts. Lie face down with arms beside your body. Keep hips and torso pressed to floor as you slowly lift your head and shoulders off the floor. Do not overdo your exercises, especially in the beginning. Exercises may cause you some mild back discomfort which lasts for a few minutes; however, if the pain is more severe, or lasts for more  than 15 minutes, do not continue exercises until you see your caregiver. Improvement with exercise therapy for back problems is slow.  See your caregivers for assistance with developing a proper back exercise program. Document Released: 11/30/2004 Document Revised: 01/15/2012 Document Reviewed: 08/24/2011 [facility] Patient Information 2015 [place], [place]. This information is not intended to replace advice given to you by your health care provider. Make sure you discuss any questions you have with your health care provider.

## 2014-08-06 NOTE — ED Provider Notes (Signed)
CSN: [id number]     Arrival date & time 08/06/14  0751 History   First MD Initiated Contact with Patient 08/06/14 0755     Chief Complaint  Patient presents with  . Back Pain  . Leg Pain     (Consider location/radiation/quality/duration/timing/severity/associated sxs/prior Treatment) HPI Complains of right-sided paralumbar back pain radiating to the anterior proximal right thigh gradual onset last night. Patient has had similar pain intermittently for the past 6 months. No trauma. No fever. No incontinence. Pain is worse with movement improved with remains still. She treated herself with a hydrocodone tablet, without relief. States hydrocodone made her mildly nauseated. No other associated symptoms pain is severe and sharp Past Medical History  Diagnosis Date  . Hypertension   . Bipolar 1 disorder 2010    ect  . Anxiety   . Asthma   . Fibroids   . Plantar fasciitis 2013  . Sciatica     RT hip   Past Surgical History  Procedure Laterality Date  . Sp cholecystomy  1986  . Cesarean section  1995  . Gastric bypass  2003  . Cystectomy      throat   Family History  Problem Relation Age of Onset  . Adopted: Yes  . Aneurysm Father   . Alcohol abuse Father   . Alcohol abuse Brother   . Alcohol abuse Brother   . Alcohol abuse Brother   . Lung cancer Sister    History  Substance Use Topics  . Smoking status: Former Smoker -- 0.50 packs/day for 10 years    Types: Cigarettes    Quit date: 11/07/1991  . Smokeless tobacco: Never Used  . Alcohol Use: 10.5 oz/week    21 drink(s) per week   OB History   Grav Para Term Preterm Abortions TAB SAB Ect Mult Living   1 1 1       1      Review of Systems  Musculoskeletal: Positive for back pain.  All other systems reviewed and are negative.     Allergies  Review of patient's allergies indicates no known allergies.  Home Medications   Prior to Admission medications   Medication Sig Start Date End Date Taking? Authorizing  Provider  albuterol (PROVENTIL HFA;VENTOLIN HFA) 108 (90 BASE) MCG/ACT inhaler Inhale 2 puffs into the lungs every 6 (six) hours as needed.    Historical Provider, MD  buPROPion (WELLBUTRIN) 100 MG tablet Take 2 tablets (200 mg total) by mouth 2 (two) times daily. 01/28/14   Waldon Merl, MD  cholecalciferol (VITAMIN D) 1000 UNITS tablet Take 5,000 Units by mouth daily.    Historical Provider, MD  Cyanocobalamin (VITAMIN B-12) 5000 MCG SUBL Place 5,000 mcg under the tongue daily.    Historical Provider, MD  diclofenac (VOLTAREN) 75 MG EC tablet Take 75 mg by mouth Twice daily. 08/29/12   Historical Provider, MD  dicyclomine (BENTYL) 20 MG tablet Take 20 mg by mouth 2 (two) times daily. 01/12/14   Historical Provider, MD  Flaxseed, Linseed, (FLAX SEED OIL PO) Take by mouth.    Historical Provider, MD  HYDROcodone-acetaminophen (NORCO/VICODIN) 5-325 MG per tablet Take 1 tablet by mouth daily. 01/12/14   Historical Provider, MD  lisinopril-hydrochlorothiazide (PRINZIDE,ZESTORETIC) 10-12.5 MG per tablet Take 10-12.5 mg by mouth Daily. 03/02/12   Historical Provider, MD  lithium carbonate (LITHOBID) 300 MG CR tablet Take 1 tablet (300 mg total) by mouth 2 (two) times daily. 01/28/14   Waldon Merl, MD  Multiple Vitamin (MULTIVITAMIN)  tablet Take 1 tablet by mouth daily.    Historical Provider, MD  ondansetron (ZOFRAN ODT) 4 MG disintegrating tablet Take 1 tablet (4 mg total) by mouth every 8 (eight) hours as needed for nausea or vomiting. 02/21/14   Fransico Meadow, PA-C  oxyCODONE-acetaminophen (PERCOCET/ROXICET) 5-325 MG per tablet Take 2 tablets by mouth every 4 (four) hours as needed for severe pain. 02/21/14   Fransico Meadow, PA-C  PARoxetine (PAXIL) 30 MG tablet Take 1 tablet (30 mg total) by mouth every morning. 01/28/14   Waldon Merl, MD   LMP 09/11/2013 Physical Exam  Nursing note and vitals reviewed. Constitutional: She appears well-developed and well-nourished. She appears distressed.   Glasgow Coma Score 15 appears mildly uncomfortable  HENT:  Head: Normocephalic and atraumatic.  Eyes: Conjunctivae are normal. Pupils are equal, round, and reactive to light.  Neck: Neck supple. No tracheal deviation present. No thyromegaly present.  Cardiovascular: Normal rate and regular rhythm.   No murmur heard. Pulmonary/Chest: Effort normal and breath sounds normal.  Abdominal: Soft. Bowel sounds are normal. She exhibits no distension. There is no tenderness.  Obese  Musculoskeletal: Normal range of motion. She exhibits no edema and no tenderness.  Right-sided paralumbar tenderness. No tenderness along spine. Back pain is exacerbated when she stands up from a seated position  Neurological: She is alert. She has normal reflexes. Coordination normal.  Walks with a limp favoring right leg. DTRs symmetric bilaterally at knee jerk ankle jerk bilaterally. Toes downgoing bilaterally  Skin: Skin is warm and dry. No rash noted.  Psychiatric: She has a normal mood and affect.    ED Course  Procedures (including critical care time) Labs Review Labs Reviewed - No data to display  Imaging Review No results found.   EKG Interpretation None      MDM   Final diagnoses:  None    emergent imaging not indicated.no red flags for back pain. Plan Rx percocet, reglan. Pt has appt with primary care physician tomorrow for back pain Diagnosis lumbar radiculopathy     Orlie Dakin, MD 08/06/14 8309

## 2014-08-06 NOTE — ED Notes (Signed)
MD at bedside. 

## 2014-08-07 DIAGNOSIS — M5432 Sciatica, left side: Secondary | ICD-10-CM | POA: Diagnosis not present

## 2014-08-07 DIAGNOSIS — Z791 Long term (current) use of non-steroidal anti-inflammatories (NSAID): Secondary | ICD-10-CM | POA: Diagnosis not present

## 2014-08-07 DIAGNOSIS — Z79899 Other long term (current) drug therapy: Secondary | ICD-10-CM | POA: Diagnosis not present

## 2014-08-07 DIAGNOSIS — F329 Major depressive disorder, single episode, unspecified: Secondary | ICD-10-CM | POA: Diagnosis not present

## 2014-08-07 DIAGNOSIS — Z87891 Personal history of nicotine dependence: Secondary | ICD-10-CM | POA: Diagnosis not present

## 2014-08-07 DIAGNOSIS — M5442 Lumbago with sciatica, left side: Secondary | ICD-10-CM | POA: Diagnosis not present

## 2014-08-07 DIAGNOSIS — J45909 Unspecified asthma, uncomplicated: Secondary | ICD-10-CM | POA: Diagnosis not present

## 2014-08-07 DIAGNOSIS — M25451 Effusion, right hip: Secondary | ICD-10-CM | POA: Diagnosis not present

## 2014-08-07 DIAGNOSIS — M545 Low back pain: Secondary | ICD-10-CM | POA: Diagnosis not present

## 2014-08-07 DIAGNOSIS — I10 Essential (primary) hypertension: Secondary | ICD-10-CM | POA: Diagnosis not present

## 2014-08-12 ENCOUNTER — Ambulatory Visit (INDEPENDENT_AMBULATORY_CARE_PROVIDER_SITE_OTHER): Payer: Medicare Other | Admitting: Family

## 2014-08-12 ENCOUNTER — Encounter: Payer: Self-pay | Admitting: Family

## 2014-08-12 VITALS — HR 68 | Temp 98.2°F | Resp 16 | Ht 64.5 in | Wt 254.6 lb

## 2014-08-12 DIAGNOSIS — E049 Nontoxic goiter, unspecified: Secondary | ICD-10-CM

## 2014-08-12 DIAGNOSIS — J4520 Mild intermittent asthma, uncomplicated: Secondary | ICD-10-CM

## 2014-08-12 DIAGNOSIS — Z23 Encounter for immunization: Secondary | ICD-10-CM

## 2014-08-12 DIAGNOSIS — M5416 Radiculopathy, lumbar region: Secondary | ICD-10-CM

## 2014-08-12 DIAGNOSIS — F332 Major depressive disorder, recurrent severe without psychotic features: Secondary | ICD-10-CM | POA: Diagnosis not present

## 2014-08-12 DIAGNOSIS — I10 Essential (primary) hypertension: Secondary | ICD-10-CM | POA: Diagnosis not present

## 2014-08-12 DIAGNOSIS — E559 Vitamin D deficiency, unspecified: Secondary | ICD-10-CM

## 2014-08-12 DIAGNOSIS — M5417 Radiculopathy, lumbosacral region: Secondary | ICD-10-CM | POA: Diagnosis not present

## 2014-08-12 DIAGNOSIS — E010 Iodine-deficiency related diffuse (endemic) goiter: Secondary | ICD-10-CM

## 2014-08-12 DIAGNOSIS — F331 Major depressive disorder, recurrent, moderate: Secondary | ICD-10-CM

## 2014-08-12 DIAGNOSIS — M5431 Sciatica, right side: Secondary | ICD-10-CM

## 2014-08-12 MED ORDER — LITHIUM CARBONATE ER 300 MG PO TBCR: 300.0000 mg | EXTENDED_RELEASE_TABLET | Freq: Two times a day (BID) | ORAL | Status: DC

## 2014-08-12 MED ORDER — PAROXETINE HCL 30 MG PO TABS: 30.0000 mg | ORAL_TABLET | ORAL | Status: DC

## 2014-08-12 MED ORDER — DICLOFENAC SODIUM 75 MG PO TBEC
75.0000 mg | DELAYED_RELEASE_TABLET | Freq: Every day | ORAL | Status: DC | PRN
Start: 2014-08-12 — End: 2014-11-13

## 2014-08-12 MED ORDER — BUPROPION HCL 100 MG PO TABS: 200.0000 mg | ORAL_TABLET | Freq: Two times a day (BID) | ORAL | Status: DC

## 2014-08-12 MED ORDER — ALBUTEROL SULFATE HFA 108 (90 BASE) MCG/ACT IN AERS: 2.0000 | INHALATION_SPRAY | Freq: Four times a day (QID) | RESPIRATORY_TRACT | Status: DC | PRN

## 2014-08-12 MED ORDER — LISINOPRIL-HYDROCHLOROTHIAZIDE 10-12.5 MG PO TABS: 1.0000 | ORAL_TABLET | Freq: Every day | ORAL | Status: DC

## 2014-08-12 NOTE — Progress Notes (Signed)
Pre visit review using our clinic review tool, if applicable. No additional management support is needed unless otherwise documented below in the visit note. 

## 2014-08-12 NOTE — Patient Instructions (Addendum)
Contact Dr. Ulice Brilliant to schedule new patient appointment- [phone] Complete lab work prior to leaving. You will be contacted about your MRI and your thyroid ultrasound. Follow up in 3 months for a complete physical. Welcome to [facility]!

## 2014-08-12 NOTE — Progress Notes (Signed)
Subjective:    Patient ID: Julie Powell, female    DOB: 06-20-1961, 53 y.o.   MRN: [id number]  HPI  Julie Powell is a 53 yr old female who presents today to establish care. [place] and [employer].    1) Vit D deficiency- on vit d supplement.  2) Asthma- reports well controlled. Reports symptoms are seasonal.  Uses a few times a week in October due to ragweed.    3) Sciatica- reports right leg numbness and decreased strength in the right leg. Symptoms started 1 year ago.  Started on the left side.  She reports that she has had an x-ray.  She is currently on prednisone which is helping.  (was seen ED on 10/1) though still has numbness/weakness.    4) Depression- patient has pmhx of Bipolar I-Major Depressive disorder (severe/recurrent) and social phobia.  Chart review notes 1 previous suicide attempt age 77 in her lifetime and several hospitalizations. Reports depression symptoms are stable. Does not feel that she has made a lot of progress.  Feels unmotivated.  Denies SI/HI.     Review of Systems  Constitutional:       Weight stable  HENT: Negative for rhinorrhea.   Respiratory: Negative for cough.   Cardiovascular: Negative for chest pain.  Gastrointestinal: Negative for nausea, vomiting and diarrhea.  Genitourinary: Negative for dysuria and frequency.  Musculoskeletal: Positive for back pain.  Skin: Negative for rash.  Neurological: Negative for headaches.  Hematological: Negative for adenopathy.  Psychiatric/Behavioral:       Reports some anxiety symptoms.   Past Medical History  Diagnosis Date  . Hypertension   . Bipolar 1 disorder 2010    ect  . Anxiety   . Asthma   . Fibroids   . Plantar fasciitis 2013  . Sciatica     RT hip  . B12 deficiency   . Vitamin D deficiency     History   Social History  . Marital Status: Divorced    Spouse Name: N/A    Number of Children: N/A  . Years of Education: N/A   Occupational History  .  Not on file.   Social History Main Topics  . Smoking status: Former Smoker -- 0.50 packs/day for 10 years    Types: Cigarettes    Quit date: 11/07/1991  . Smokeless tobacco: Never Used  . Alcohol Use: 7.0 oz/week    14 drink(s) per week  . Drug Use: No     Comment: Caffeine- Carbonated beverage 16 ounces.  . Sexual Activity: No   Other Topics Concern  . Not on file   Social History Narrative   Lives with 67 year old son   She is on disability due to depression   In the past she has worked as a Education officer, community for state of [place]   Former [employer]   Completed bachelor's degree   Divorced.   Enjoys television    Past Surgical History  Procedure Laterality Date  . Sp cholecystomy  1986  . Cesarean section  1995  . Gastric bypass  2003  . Cystectomy  1998    "thyroglycol duct cyst in neck" had dysphagia, was told benign  . Pilonidal cyst drainage  1982    Family History  Problem Relation Age of Onset  . Adopted: Yes  . Aneurysm Father   . Alcohol abuse Father   . Alcohol abuse Brother   . Alcohol abuse Brother   .  Cancer Brother     prostate  . Alcohol abuse Brother   . Lung cancer Sister     No Known Allergies  Current Outpatient Prescriptions on File Prior to Visit  Medication Sig Dispense Refill  . cholecalciferol (VITAMIN D) 1000 UNITS tablet Take 5,000 Units by mouth daily.      . Cyanocobalamin (VITAMIN B-12) 5000 MCG SUBL Place 5,000 mcg under the tongue daily.      . Flaxseed, Linseed, (FLAX SEED OIL PO) Take by mouth.      . Multiple Vitamin (MULTIVITAMIN) tablet Take 1 tablet by mouth daily.      Marland Kitchen oxyCODONE-acetaminophen (PERCOCET) 5-325 MG per tablet Take 1 tablet by mouth every 6 (six) hours as needed for severe pain.  6 tablet  0   No current facility-administered medications on file prior to visit.    Pulse 68  Temp(Src) 98.2 F (36.8 C) (Oral)  Resp 16  Ht 5' 4.5" (1.638 m)  Wt  254 lb 9.6 oz (115.486 kg)  BMI 43.04 kg/m2  SpO2 100%  LMP 09/11/2013       Objective:   Physical Exam  Constitutional: She is oriented to person, place, and time. She appears well-developed and well-nourished. No distress.  HENT:  Head: Normocephalic and atraumatic.  Eyes: No scleral icterus.  Cardiovascular: Normal rate and regular rhythm.   No murmur heard. Pulmonary/Chest: Effort normal and breath sounds normal. No respiratory distress. She has no wheezes. She has no rales. She exhibits no tenderness.  Abdominal: Soft.  Musculoskeletal: She exhibits no edema.  Lymphadenopathy:    She has no cervical adenopathy.  Neurological: She is alert and oriented to person, place, and time.  Reflex Scores:      Patellar reflexes are 0 on the right side and 0 on the left side. Very slight RLE weakness on exam as compared to LLE  Skin: Skin is warm and dry.  Psychiatric: She has a normal mood and affect. Her behavior is normal. Judgment and thought content normal.          Assessment & Plan:

## 2014-08-13 DIAGNOSIS — M5431 Sciatica, right side: Secondary | ICD-10-CM

## 2014-08-13 DIAGNOSIS — E559 Vitamin D deficiency, unspecified: Secondary | ICD-10-CM | POA: Insufficient documentation

## 2014-08-13 DIAGNOSIS — J45909 Unspecified asthma, uncomplicated: Secondary | ICD-10-CM | POA: Insufficient documentation

## 2014-08-13 DIAGNOSIS — M5430 Sciatica, unspecified side: Secondary | ICD-10-CM | POA: Insufficient documentation

## 2014-08-13 LAB — BASIC METABOLIC PANEL
BUN: 19 mg/dL (ref 6–23)
CO2: 25 mEq/L (ref 19–32)
Calcium: 9.6 mg/dL (ref 8.4–10.5)
Chloride: 103 mEq/L (ref 96–112)
Creatinine, Ser: 1 mg/dL (ref 0.4–1.2)
GFR: 76.95 mL/min (ref >60.00)
Glucose, Bld: 65 mg/dL — ABNORMAL LOW (ref 70–99)
Potassium: 3.8 mEq/L (ref 3.5–5.1)
Sodium: 141 mEq/L (ref 135–145)

## 2014-08-13 LAB — TSH: TSH: 0.42 u[IU]/mL (ref 0.35–4.50)

## 2014-08-13 LAB — LITHIUM LEVEL: Lithium Lvl: 0.4 mEq/L — ABNORMAL LOW (ref 0.80–1.40)

## 2014-08-13 LAB — VITAMIN D 25 HYDROXY (VIT D DEFICIENCY, FRACTURES): VITD: 44.17 ng/mL (ref 30.00–100.00)

## 2014-08-13 NOTE — Assessment & Plan Note (Signed)
Uncontrolled. Obtain MRI of the lumbar/sacral spine

## 2014-08-13 NOTE — Assessment & Plan Note (Signed)
Stable, continue prn albuterol.

## 2014-08-13 NOTE — Assessment & Plan Note (Signed)
Continue supplement, obtain follow up vit d level.

## 2014-08-13 NOTE — Assessment & Plan Note (Addendum)
Fair control at this point.  Pt was given psychiatry number to call for new patient appointment with Dr. Ulice Brilliant.  Continue current meds. Obtain lithium level, bmet.  We discussed cutting down on alcohol intake.

## 2014-08-15 ENCOUNTER — Ambulatory Visit (HOSPITAL_BASED_OUTPATIENT_CLINIC_OR_DEPARTMENT_OTHER)
Admission: RE | Admit: 2014-08-15 | Discharge: 2014-08-15 | Disposition: A | Discharge status: Home / Self Care | Payer: Medicare Other | Source: Ambulatory Visit | Attending: Family | Admitting: Family

## 2014-08-15 DIAGNOSIS — E042 Nontoxic multinodular goiter: Secondary | ICD-10-CM | POA: Diagnosis not present

## 2014-08-15 DIAGNOSIS — M5416 Radiculopathy, lumbar region: Secondary | ICD-10-CM

## 2014-08-15 DIAGNOSIS — M5126 Other intervertebral disc displacement, lumbar region: Secondary | ICD-10-CM | POA: Diagnosis not present

## 2014-08-15 DIAGNOSIS — M4806 Spinal stenosis, lumbar region: Secondary | ICD-10-CM | POA: Diagnosis not present

## 2014-08-15 DIAGNOSIS — M5136 Other intervertebral disc degeneration, lumbar region: Secondary | ICD-10-CM | POA: Diagnosis not present

## 2014-08-15 DIAGNOSIS — M5417 Radiculopathy, lumbosacral region: Secondary | ICD-10-CM | POA: Insufficient documentation

## 2014-08-15 DIAGNOSIS — E010 Iodine-deficiency related diffuse (endemic) goiter: Secondary | ICD-10-CM

## 2014-08-16 ENCOUNTER — Telehealth: Payer: Self-pay | Admitting: Family

## 2014-08-16 DIAGNOSIS — M5136 Other intervertebral disc degeneration, lumbar region: Secondary | ICD-10-CM

## 2014-08-16 DIAGNOSIS — E041 Nontoxic single thyroid nodule: Secondary | ICD-10-CM

## 2014-08-16 NOTE — Telephone Encounter (Signed)
Please contact pt and let her know that her thyroid ultrasound shows some thyroid cysts/nodule. I would like her to meet with Endocrinology for further evaluation.

## 2014-08-16 NOTE — Telephone Encounter (Addendum)
Also, I reviewed her MRI and it shows multiple bulging discs as well as some nerve compression which is likely cause for her leg pain and weakness.  I would recommend that she meet with neurosurgery for further evaluation please. Lithium level is low.  I will defer to psychiatry to adjust her lithium.  Make sure she is taking lithium bid.

## 2014-08-17 NOTE — Telephone Encounter (Signed)
Left detailed message on pt's cell# and to call if any questions. 

## 2014-08-25 ENCOUNTER — Encounter (HOSPITAL_COMMUNITY): Payer: Self-pay | Admitting: Psychiatry

## 2014-08-25 ENCOUNTER — Ambulatory Visit (INDEPENDENT_AMBULATORY_CARE_PROVIDER_SITE_OTHER): Payer: Medicare Other | Admitting: Psychiatry

## 2014-08-25 VITALS — HR 88 | Ht 64.0 in | Wt 255.0 lb

## 2014-08-25 DIAGNOSIS — F4010 Social phobia, unspecified: Secondary | ICD-10-CM

## 2014-08-25 DIAGNOSIS — F331 Major depressive disorder, recurrent, moderate: Secondary | ICD-10-CM

## 2014-08-25 DIAGNOSIS — F332 Major depressive disorder, recurrent severe without psychotic features: Secondary | ICD-10-CM

## 2014-08-25 NOTE — Progress Notes (Signed)
Patient ID: Julie Powell, female   DOB: 1961-01-31, 53 y.o.   MRN: [id number] Julie Powell Follow-up Outpatient Visit  Julie Powell January 01, 1961   Date: 08/25/2014  Chief Complaint  Patient presents with  . Depression  . Follow-up    HPI Comments: Julie Powell is a 53  Y/O woman with a past psychiatric history Major Depressive Disorder, severe, recurrent, and Social Phobia.  The patient is referred for psychiatric services for  medication management.   . Location: She reports she her mood remains same. Some down days but not hopeless. Has seen dr.Puthavel in march. Was able to get refil from her primary care. Feels comfortable with her meds. Stays mostly home and not goes out. Still has phobia of social situations or to make phone calls.  She will follow with endocrinologist. Her lithium level is 0.4 and tsh were also reviewed.  . Quality:   In the area of affective symptoms, patient appears mildly anxious. Patient denies current suicidal ideation, intent, or plan. She reports no further suicidal ideation for several months now. Patient denies current homicidal ideation, intent, or plan. Patient denies auditory hallucinations. Patient denies visual hallucinations. Patient denies symptoms of paranoia. Patient states sleep is good with 8 hours. Appetite is good. Energy level is low.  Patient endorses no further improvement in symptoms of anhedonia, but has not taken many efforts to improve her life. She reports she has reached out to family and visited a cousin.  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 She states she continues to experience some irritability. Patient reports feelings of hopelessness, helplessness, and guilt.   . Severity: Depression: 6/10 (0=Very depressed; 5=Neutral; 10=Very Happy)-  Anxiety- 5/10 (0=no anxiety; 5= moderate/tolerable anxiety; 10= panic attacks)  . Duration: Since age 53, had a suicide attempt at age 11.   .  Timing: Worse in the evenings   . Context: She perceives herself failure. Low self esteem.  . Modifying factors: Patient   . Associated signs and symptoms: Denies any recent episodes consistent with mania, particularly decreased need for sleep with increased energy, grandiosity, impulsivity, hyperverbal and pressured speech, or increased productivity. The patient denies symptoms consistent with psychosis particularly auditory or visual hallucinations, thought broadcasting/insertion/withdrawal, or ideas of reference. The patient endorses excessive worry but will still remove herself from situations prior to getting to experiencing physical symptoms or panic attacks. However, in doing so she will isolate herself from friends, family or any potentially positive interaction. She reports a history of trauma and symptoms consistent with PTSD including hypervigilance, feelings of numbness or inability to connect with others, and she endorses avoidance of friends and family, though she has been able to contact. She endorse avoidance of social interaction as a means of coping with anxiety.    Traumatic Brain Injury: No none   Review of Systems  Constitutional: Negative for fever and diaphoresis.  Respiratory: Negative for cough.   Cardiovascular: Negative for chest pain and leg swelling.  Gastrointestinal: Negative for nausea.  Musculoskeletal: Positive for joint pain (Left sided.).  Neurological: Negative for headaches.  Psychiatric/Behavioral: Positive for substance abuse. Negative for depression, suicidal ideas and hallucinations. The patient is not nervous/anxious and does not have insomnia.    Filed Vitals:   08/25/14 1440  Pulse: 88  Height: 5' 4" (1.626 m)  Weight: 255 lb (115.667 kg)   Physical Exam  Constitutional: She appears well-developed and well-nourished. No distress.  Skin: She is not diaphoretic.  No  tremors of hands noted while patient was interviewed.  Musculoskeletal: Gait  & Station: normal Patient leans: N/A   Past Psychiatric History: Reviewed   Diagnosis: Major Depressive Disorder, recurrent, severe   Hospitalizations: Several since age 53   Outpatient Care: Since   Substance Abuse Care: none   Self-Mutilation: Patient denies   Suicidal Attempts: Three attempts   Violent Behaviors: Patient denies.    Past Medical History: Reviewed  Past Medical History  Diagnosis Date  . Hypertension   . Bipolar 1 disorder 2010    ect  . Anxiety   . Asthma   . Fibroids   . Plantar fasciitis 2013  . Sciatica     RT hip  . B12 deficiency   . Vitamin D deficiency    History of Loss of Consciousness: No  Seizure History: No  Cardiac History: Yes-HTN  Allergies: No Known Allergies   Current Medications: Reviewed   Current Outpatient Prescriptions on File Prior to Visit  Medication Sig Dispense Refill  . albuterol (PROVENTIL HFA;VENTOLIN HFA) 108 (90 BASE) MCG/ACT inhaler Inhale 2 puffs into the lungs every 6 (six) hours as needed.  3 Inhaler  1  . buPROPion (WELLBUTRIN) 100 MG tablet Take 2 tablets (200 mg total) by mouth 2 (two) times daily.  360 tablet  0  . cholecalciferol (VITAMIN D) 1000 UNITS tablet Take 5,000 Units by mouth daily.      . Cyanocobalamin (VITAMIN B-12) 5000 MCG SUBL Place 5,000 mcg under the tongue daily.      . diclofenac (VOLTAREN) 75 MG EC tablet Take 1 tablet (75 mg total) by mouth daily as needed.  90 tablet  0  . Flaxseed, Linseed, (FLAX SEED OIL PO) Take by mouth.      . hyoscyamine (LEVSIN, ANASPAZ) 0.125 MG tablet Take 0.125 mg by mouth as needed.      Marland Kitchen lisinopril-hydrochlorothiazide (PRINZIDE,ZESTORETIC) 10-12.5 MG per tablet Take 1 tablet by mouth daily.  90 tablet  0  . lithium carbonate (LITHOBID) 300 MG CR tablet Take 1 tablet (300 mg total) by mouth 2 (two) times daily.  180 tablet  0  . methocarbamol (ROBAXIN) 500 MG tablet Take 500 mg by mouth 2 (two) times daily as needed for muscle spasms.      . Multiple Vitamin  (MULTIVITAMIN) tablet Take 1 tablet by mouth daily.      Marland Kitchen oxyCODONE-acetaminophen (PERCOCET) 5-325 MG per tablet Take 1 tablet by mouth every 6 (six) hours as needed for severe pain.  6 tablet  0  . PARoxetine (PAXIL) 30 MG tablet Take 1 tablet (30 mg total) by mouth every morning.  90 tablet  0  . PredniSONE 10 MG KIT Take by mouth. For 12 days       No current facility-administered medications on file prior to visit.   Previous Psychotropic Medications: Reviewed  Medication   Bupropion   Paxil   Lithium   Lamictal-possible rash   Depakote-"spacey"   Prozac-choking sensation   Geodon-didn't work.   Aripiprazole-didn't want to take.     Family History: Reviewed  Family History   Problem  Relation  Age of Onset   .  Adopted: Yes   .  Aneurysm  Father     Psychiatric Specialty Exam:  Objective: Appearance: Well Groomed   Eye Contact:: Good   Speech: Clear and Coherent and Normal Rate   Volume: Normal   Mood: "I don't feel great but I don't feel bad"   Affect: Appropriate, Congruent  and Restricted   Thought Process: Goal Directed, Logical and Loose   Orientation: Full   Thought Content: WDL   Suicidal Thoughts: Patient denies   Homicidal Thoughts: Patient denies   Judgement: Fair   Insight: Fair   Psychomotor Activity: Normal   Akathisia: No   Handed: Right   Memory: Immediate 3/3, Recent: 1/3   [place] of knowledge-average to above average.  AIMS (if indicated): Not indicated at this time.   Assets: Communication Skills  Desire for Improvement    Assessment:  AXIS I: Major Depressive Disorder, severe, recurrent--stable Social Phobia-slightly improving   Treatment Plan/Recommendations:   Plan of Care:  PLAN:  1. Affirm with the patient that the medications are taken as ordered. Patient  expressed understanding of how their medications were to be used.    Laboratory:  reveiwed  Psychotherapy: Therapy: brief supportive therapy provided.  Discussed psychosocial stressors. Again, encouraged patient to continue spending time daily making changes to 4 important areas of her life. More than 50% of the visit was spent on individual therapy.   Medications:  Continue the following psychiatric medications as written prior to this appointment with the following changes::  She has had refills on her lithium, wellbutrin and paxil.  Advised to avoid alcohol intake -Risks and benefits, side effects and alternatives discussed with patient, she was given an opportunity to ask questions about her medication, illness, and treatment. All current psychiatric medications have been reviewed and discussed with the patient and adjusted as clinically appropriate. The patient has been provided an accurate and updated list of the medications being now prescribed.   Routine PRN Medications:  Negative  Consultations: The patient was encouraged to keep all PCP and specialty clinic appointments.   Safety Concerns:   Patient told to call clinic if any problems occur. Patient advised to go to  ER  if she should develop SI/HI, side effects, or if symptoms worsen. Has crisis numbers to call if needed.    Other:   8. Patient was instructed to return to clinic in 1 months.  9. The patient was advised to call and cancel their mental health appointment within 24 hours of the appointment, if they are unable to keep the appointment, as well as the three no show and termination from clinic policy. 10. The patient expressed understanding of the plan and agrees with the above. Fu in 10 weeks   Time Spent: 30 minutes Merian Capron, M.D.  08/25/2014 2:47 PM

## 2014-08-26 ENCOUNTER — Encounter: Payer: Self-pay | Admitting: Endocrinology

## 2014-08-26 ENCOUNTER — Ambulatory Visit (INDEPENDENT_AMBULATORY_CARE_PROVIDER_SITE_OTHER): Payer: Medicare Other | Admitting: Endocrinology

## 2014-08-26 VITALS — BP 103/74 | HR 64 | Temp 98.0°F | Ht 66.0 in | Wt 255.0 lb

## 2014-08-26 DIAGNOSIS — E042 Nontoxic multinodular goiter: Secondary | ICD-10-CM | POA: Diagnosis not present

## 2014-08-26 NOTE — Progress Notes (Signed)
Subjective:    Patient ID: Julie Powell, female    DOB: 09/26/1961, 53 y.o.   MRN: [id number]  HPI Pt was noted to have a slight nodule at the anterior neck in 2015, on a routine examination.  she has no h/o XRT to the neck.  No assoc pain.  She had resection of a thyroglossal duct in 1998.  She had gastric bypass surgery in 2003.   Past Medical History  Diagnosis Date  . Hypertension   . Bipolar 1 disorder 2010    ect  . Anxiety   . Asthma   . Fibroids   . Plantar fasciitis 2013  . Sciatica     RT hip  . B12 deficiency   . Vitamin D deficiency     Past Surgical History  Procedure Laterality Date  . Sp cholecystomy  1986  . Cesarean section  1995  . Gastric bypass  2003  . Cystectomy  1998    "thyroglycol duct cyst in neck" had dysphagia, was told benign  . Pilonidal cyst drainage  1982    History   Social History  . Marital Status: Divorced    Spouse Name: N/A    Number of Children: N/A  . Years of Education: N/A   Occupational History  . Not on file.   Social History Main Topics  . Smoking status: Former Smoker -- 0.50 packs/day for 10 years    Types: Cigarettes    Quit date: 11/07/1991  . Smokeless tobacco: Never Used  . Alcohol Use: 7.0 oz/week    14 drink(s) per week  . Drug Use: No     Comment: Caffeine- Carbonated beverage 16 ounces.  . Sexual Activity: No   Other Topics Concern  . Not on file   Social History Narrative   Lives with 66 year old son   She is on disability due to depression   In the past she has worked as a Education officer, community for state of [place]   Former [employer]   Completed bachelor's degree   Divorced.   Enjoys television    Current Outpatient Prescriptions on File Prior to Visit  Medication Sig Dispense Refill  . albuterol (PROVENTIL HFA;VENTOLIN HFA) 108 (90 BASE) MCG/ACT inhaler Inhale 2 puffs into the lungs every 6 (six) hours as needed.  3 Inhaler  1  .  buPROPion (WELLBUTRIN) 100 MG tablet Take 2 tablets (200 mg total) by mouth 2 (two) times daily.  360 tablet  0  . cholecalciferol (VITAMIN D) 1000 UNITS tablet Take 5,000 Units by mouth daily.      . Cyanocobalamin (VITAMIN B-12) 5000 MCG SUBL Place 5,000 mcg under the tongue daily.      . diclofenac (VOLTAREN) 75 MG EC tablet Take 1 tablet (75 mg total) by mouth daily as needed.  90 tablet  0  . Flaxseed, Linseed, (FLAX SEED OIL PO) Take by mouth.      . hyoscyamine (LEVSIN, ANASPAZ) 0.125 MG tablet Take 0.125 mg by mouth as needed.      Marland Kitchen lisinopril-hydrochlorothiazide (PRINZIDE,ZESTORETIC) 10-12.5 MG per tablet Take 1 tablet by mouth daily.  90 tablet  0  . lithium carbonate (LITHOBID) 300 MG CR tablet Take 1 tablet (300 mg total) by mouth 2 (two) times daily.  180 tablet  0  . Multiple Vitamin (MULTIVITAMIN) tablet Take 1 tablet by mouth daily.      Marland Kitchen PARoxetine (PAXIL) 30 MG tablet Take 1 tablet (30 mg total)  by mouth every morning.  90 tablet  0  . PredniSONE 10 MG KIT Take by mouth. For 12 days       No current facility-administered medications on file prior to visit.    No Known Allergies  Family History  Problem Relation Age of Onset  . Adopted: Yes  . Aneurysm Father   . Alcohol abuse Father   . Alcohol abuse Brother   . Alcohol abuse Brother   . Cancer Brother     prostate  . Alcohol abuse Brother   . Lung cancer Sister   . Thyroid disease Neg Hx     BP 103/74  Pulse 64  Temp(Src) 98 F (36.7 C) (Oral)  Ht _0  (1.676 m)  Wt 255 lb (115.667 kg)  BMI 41.18 kg/m2  SpO2 98%  LMP 09/11/2013     Review of Systems denies headache, hoarseness, visual loss, palpitations, sob, diarrhea, polyuria, myalgias, easy bruising, and rhinorrhea.  She has RLE numbness.  Chronic depression is unchanged.  She has regained much of the lost weight, from has gastric bypass.  She attributes excessive diaphoresis to menopause.  She has postprandial hypoglycemia since her surgery.       Objective:   Physical Exam VS: see vs page GEN: no distress HEAD: head: no deformity eyes: no periorbital swelling, no proptosis external nose and ears are normal mouth: no lesion seen NECK: ? Right-sided thyroid fullness, but I can't tell details.  CHEST WALL: no deformity LUNGS:  Clear to auscultation CV: reg rate and rhythm, no murmur ABD: abdomen is soft, nontender.  no hepatosplenomegaly.  not distended.  no hernia MUSCULOSKELETAL: muscle bulk and strength are grossly normal.  no obvious joint swelling.  gait is normal and steady EXTEMITIES: no deformity.  no ulcer on the feet.  feet are of normal color and temp.  no edema PULSES: dorsalis pedis intact bilat.  no carotid bruit NEURO:  cn 2-12 grossly intact.   readily moves all 4's.  sensation is intact to touch on the feet SKIN:  Normal texture and temperature.  No rash or suspicious lesion is visible.   NODES:  None palpable at the neck PSYCH: alert, well-oriented.  Does not appear anxious nor depressed.   Radiol: i reviewed [place] with patient Lab Results  Component Value Date   CREATININE 1.0 08/12/2014   BUN 19 08/12/2014   NA 141 08/12/2014   K 3.8 08/12/2014   CL 103 08/12/2014   CO2 25 08/12/2014   Lab Results  Component Value Date   CALCIUM 9.6 08/12/2014   Lab Results  Component Value Date   TSH 0.42 08/12/2014       Assessment & Plan:  Multinodular goiter, new, uncertain etiology.  bx not needed now.  TSH is lower, which suggests she'll develop hyperthyroidism over time.  Parathyroid abnormality, suggested by [place].  Given her h/o gastric bypass, she may have secondary hyperparathyroidism.  However, all we can do is to normalize 25-OH vit-D level, which [name] is already doing.     Patient is advised the following: Patient Instructions  Please come back for a follow-up appointment in 3 months When you come back, we'll also check the parathyroid level (because you and [name] have your  vitamin-D in a good range, it is very unlikely you have to worry about this).   most of the time, a "lumpy thyroid" will eventually become overactive.  this is usually a slow process, happening over the span of many  years.

## 2014-08-26 NOTE — Patient Instructions (Addendum)
Please come back for a follow-up appointment in 3 months When you come back, we'll also check the parathyroid level (because you and [name] have your vitamin-D in a good range, it is very unlikely you have to worry about this).   most of the time, a "lumpy thyroid" will eventually become overactive.  this is usually a slow process, happening over the span of many years.

## 2014-08-27 DIAGNOSIS — E042 Nontoxic multinodular goiter: Secondary | ICD-10-CM | POA: Insufficient documentation

## 2014-08-27 DIAGNOSIS — Z6841 Body Mass Index (BMI) 40.0 and over, adult: Secondary | ICD-10-CM | POA: Diagnosis not present

## 2014-08-27 DIAGNOSIS — M5416 Radiculopathy, lumbar region: Secondary | ICD-10-CM | POA: Diagnosis not present

## 2014-08-27 DIAGNOSIS — M5136 Other intervertebral disc degeneration, lumbar region: Secondary | ICD-10-CM | POA: Diagnosis not present

## 2014-08-27 DIAGNOSIS — M4806 Spinal stenosis, lumbar region: Secondary | ICD-10-CM | POA: Diagnosis not present

## 2014-09-02 DIAGNOSIS — M5127 Other intervertebral disc displacement, lumbosacral region: Secondary | ICD-10-CM | POA: Diagnosis not present

## 2014-09-02 DIAGNOSIS — Z01812 Encounter for preprocedural laboratory examination: Secondary | ICD-10-CM | POA: Diagnosis not present

## 2014-09-07 ENCOUNTER — Encounter: Payer: Self-pay | Admitting: Endocrinology

## 2014-09-09 ENCOUNTER — Telehealth: Payer: Self-pay | Admitting: *Deleted

## 2014-09-09 NOTE — Telephone Encounter (Signed)
Received medical records via fax from [facility] for patient. Records forwarded to [facility]. JG//CMA

## 2014-09-11 HISTORY — PX: MICRODISCECTOMY LUMBAR: SUR864

## 2014-09-14 DIAGNOSIS — M5126 Other intervertebral disc displacement, lumbar region: Secondary | ICD-10-CM | POA: Diagnosis not present

## 2014-09-14 DIAGNOSIS — M5116 Intervertebral disc disorders with radiculopathy, lumbar region: Secondary | ICD-10-CM | POA: Diagnosis not present

## 2014-11-03 ENCOUNTER — Ambulatory Visit (HOSPITAL_COMMUNITY): Payer: Self-pay | Admitting: Psychiatry

## 2014-11-10 ENCOUNTER — Encounter (INDEPENDENT_AMBULATORY_CARE_PROVIDER_SITE_OTHER): Payer: Self-pay

## 2014-11-10 ENCOUNTER — Ambulatory Visit (INDEPENDENT_AMBULATORY_CARE_PROVIDER_SITE_OTHER): Payer: Medicare Other | Admitting: Psychiatry

## 2014-11-10 ENCOUNTER — Encounter (HOSPITAL_COMMUNITY): Payer: Self-pay | Admitting: Psychiatry

## 2014-11-10 VITALS — HR 90 | Ht 64.0 in | Wt 255.0 lb

## 2014-11-10 DIAGNOSIS — F332 Major depressive disorder, recurrent severe without psychotic features: Secondary | ICD-10-CM

## 2014-11-10 DIAGNOSIS — F4010 Social phobia, unspecified: Secondary | ICD-10-CM | POA: Diagnosis not present

## 2014-11-10 DIAGNOSIS — F331 Major depressive disorder, recurrent, moderate: Secondary | ICD-10-CM

## 2014-11-10 MED ORDER — PAROXETINE HCL 30 MG PO TABS: 30.0000 mg | ORAL_TABLET | ORAL | Status: DC

## 2014-11-10 MED ORDER — BUPROPION HCL 100 MG PO TABS
200.0000 mg | ORAL_TABLET | Freq: Two times a day (BID) | ORAL | Status: DC
Start: 2015-01-05 — End: 2015-05-11

## 2014-11-10 MED ORDER — LITHIUM CARBONATE ER 300 MG PO TBCR: 300.0000 mg | EXTENDED_RELEASE_TABLET | Freq: Two times a day (BID) | ORAL | Status: DC

## 2014-11-10 NOTE — Progress Notes (Signed)
Patient ID: Julie Powell, female   DOB: 21-Nov-1960, 54 y.o.   MRN: [id number] [place] Follow-up Outpatient Visit  Arlett Goold [birth year]   Date: 11/10/2013  Chief Complaint  Patient presents with  . Depression  . Stress    HPI Comments: Julie Powell is a 54  Y/O woman with a past psychiatric history Major Depressive Disorder, severe, recurrent, and Social Phobia.  The patient is referred for psychiatric services for  medication management.   . Location: She reports she her mood remains same. Some down days but not hopeless. Has seen dr.Puthavel in march. Was able to get refil from her primary care. Feels comfortable with her meds. Stays mostly home and not goes out. Still has phobia of social situations or to make phone calls.  She will follow with endocrinologist. Her lithium level is 0.4 and tsh were also reviewed.  Recently she had L4/5 herniated disc surgery . Now recovering. It had her unable to move around but now she is mobile. She still remains to herself and does not want to be around people.  No side effects reported. She gets a 90 day supply of meds. No suicidal toughts.   . Quality:   In the area of affective symptoms, patient appears mildly anxious. Patient denies current suicidal ideation, intent, or plan. She reports no further suicidal ideation for several months now. Patient denies current homicidal ideation, intent, or plan. Patient denies auditory hallucinations. Patient denies visual hallucinations. Patient denies symptoms of paranoia. Patient states sleep is good with 8 hours. Appetite is good. Energy level is low.  Patient endorses no further improvement in symptoms of anhedonia, but has not taken many efforts to improve her life. She reports she has reached out to family and visited a cousin.  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 She states she continues to experience some irritability. Patient reports feelings of  hopelessness, helplessness, and guilt.   . Severity: Depression: 6/10 (0=Very depressed; 5=Neutral; 10=Very Happy)-  Anxiety- 5/10 (0=no anxiety; 5= moderate/tolerable anxiety; 10= panic attacks)  . Duration: Since age 54, had a suicide attempt at age 54.   . Timing: Worse in the evenings   . Context: She perceives herself failure. Low self esteem.  . Modifying factors: Patient   . Associated signs and symptoms: Denies any recent episodes consistent with mania, particularly decreased need for sleep with increased energy, grandiosity, impulsivity, hyperverbal and pressured speech, or increased productivity. The patient denies symptoms consistent with psychosis particularly auditory or visual hallucinations, thought broadcasting/insertion/withdrawal, or ideas of reference. The patient endorses excessive worry but will still remove herself from situations prior to getting to experiencing physical symptoms or panic attacks. However, in doing so she will isolate herself from friends, family or any potentially positive interaction. She reports a history of trauma and symptoms consistent with PTSD including hypervigilance, feelings of numbness or inability to connect with others, and she endorses avoidance of friends and family, though she has been able to contact. She endorse avoidance of social interaction as a means of coping with anxiety.    Traumatic Brain Injury: No none   Review of Systems  Constitutional: Negative for fever and diaphoresis.  Respiratory: Negative for cough.   Cardiovascular: Negative for palpitations and leg swelling.  Gastrointestinal: Negative for nausea.  Musculoskeletal: Positive for back pain and joint pain (Left sided.).  Neurological: Negative for tremors and headaches.  Psychiatric/Behavioral: Positive for substance abuse. Negative for depression, suicidal ideas  and hallucinations. The patient is not nervous/anxious and does not have insomnia.    Filed Vitals:    11/10/14 1006  Pulse: 90  Height: 5' 4" (1.626 m)  Weight: 255 lb (115.667 kg)   Physical Exam  Constitutional: She appears well-developed and well-nourished. No distress.  Skin: She is not diaphoretic.  No tremors of hands noted while patient was interviewed.  Musculoskeletal: Gait & Station: normal Patient leans: N/A   Past Psychiatric History: Reviewed   Diagnosis: Major Depressive Disorder, recurrent, severe   Hospitalizations: Several since age 16   Outpatient Care: Since   Substance Abuse Care: none   Self-Mutilation: Patient denies   Suicidal Attempts: Three attempts   Violent Behaviors: Patient denies.    Past Medical History: Reviewed  Past Medical History  Diagnosis Date  . Hypertension   . Bipolar 1 disorder 2010    ect  . Anxiety   . Asthma   . Fibroids   . Plantar fasciitis 2013  . Sciatica     RT hip  . B12 deficiency   . Vitamin D deficiency    History of Loss of Consciousness: No  Seizure History: No  Cardiac History: Yes-HTN  Allergies: No Known Allergies   Current Medications: Reviewed   Current Outpatient Prescriptions on File Prior to Visit  Medication Sig Dispense Refill  . albuterol (PROVENTIL HFA;VENTOLIN HFA) 108 (90 BASE) MCG/ACT inhaler Inhale 2 puffs into the lungs every 6 (six) hours as needed. 3 Inhaler 1  . cholecalciferol (VITAMIN D) 1000 UNITS tablet Take 5,000 Units by mouth daily.    . Cyanocobalamin (VITAMIN B-12) 5000 MCG SUBL Place 5,000 mcg under the tongue daily.    . diclofenac (VOLTAREN) 75 MG EC tablet Take 1 tablet (75 mg total) by mouth daily as needed. 90 tablet 0  . Flaxseed, Linseed, (FLAX SEED OIL PO) Take by mouth.    . hyoscyamine (LEVSIN, ANASPAZ) 0.125 MG tablet Take 0.125 mg by mouth as needed.    . lisinopril-hydrochlorothiazide (PRINZIDE,ZESTORETIC) 10-12.5 MG per tablet Take 1 tablet by mouth daily. 90 tablet 0  . Multiple Vitamin (MULTIVITAMIN) tablet Take 1 tablet by mouth daily.    . PredniSONE 10 MG  KIT Take by mouth. For 12 days     No current facility-administered medications on file prior to visit.   Previous Psychotropic Medications: Reviewed  Medication   Bupropion   Paxil   Lithium   Lamictal-possible rash   Depakote-"spacey"   Prozac-choking sensation   Geodon-didn't work.   Aripiprazole-didn't want to take.     Family History: Reviewed  Family History   Problem  Relation  Age of Onset   .  Adopted: Yes   .  Aneurysm  Father     Psychiatric Specialty Exam:  Objective: Appearance: Well Groomed   Eye Contact:: Good   Speech: Clear and Coherent and Normal Rate   Volume: Normal   Mood: "I don't feel great but I don't feel bad"   Affect: Appropriate, Congruent and Restricted   Thought Process: Goal Directed, Logical and Loose   Orientation: Full   Thought Content: WDL   Suicidal Thoughts: Patient denies   Homicidal Thoughts: Patient denies   Judgement: Fair   Insight: Fair   Psychomotor Activity: Normal   Akathisia: No   Handed: Right   Memory: Immediate 3/3, Recent: 1/3   Language-intact  Fund of knowledge-average to above average.  AIMS (if indicated): Not indicated at this time.   Assets: Communication Skills    Desire for Improvement    Assessment:  AXIS I: Major Depressive Disorder, severe, recurrent--stable Social Phobia-slightly improving   Treatment Plan/Recommendations:   Plan of Care:  PLAN:  1. Affirm with the patient that the medications are taken as ordered. Patient  expressed understanding of how their medications were to be used.    Laboratory:  reveiwed  Psychotherapy: Therapy: brief supportive therapy provided. Discussed psychosocial stressors. Again, encouraged patient to continue spending time daily making changes to 4 important areas of her life. More than 50% of the visit was spent on individual therapy.   Medications:  Continue the following psychiatric medications as written prior to this appointment with the following  changes::  She has had refills on her lithium, wellbutrin and paxil.  Advised to avoid alcohol intake,. She gets 90 day supply so i put start date as March 1st as she recently got her prescriptions filled.  -Risks and benefits, side effects and alternatives discussed with patient, she was given an opportunity to ask questions about her medication, illness, and treatment. All current psychiatric medications have been reviewed and discussed with the patient and adjusted as clinically appropriate. The patient has been provided an accurate and updated list of the medications being now prescribed.   Routine PRN Medications:  Negative  Consultations: The patient was encouraged to keep all PCP and specialty clinic appointments.   Safety Concerns:   Patient told to call clinic if any problems occur. Patient advised to go to  ER  if she should develop SI/HI, side effects, or if symptoms worsen. Has crisis numbers to call if needed.    Other:   8. Patient was instructed to return to clinic in 3 months.  9. The patient was advised to call and cancel their mental health appointment within 24 hours of the appointment, if they are unable to keep the appointment, as well as the three no show and termination from clinic policy. 10. The patient expressed understanding of the plan and agrees with the above. Fu in 3 months.    Time Spent: 30 minutes NADEEM AKHTAR, M.D.  11/10/2014 10:14 AM 

## 2014-11-13 ENCOUNTER — Ambulatory Visit (INDEPENDENT_AMBULATORY_CARE_PROVIDER_SITE_OTHER): Payer: Medicare Other | Admitting: Family

## 2014-11-13 ENCOUNTER — Encounter: Payer: Self-pay | Admitting: Family

## 2014-11-13 VITALS — BP 106/74 | HR 70 | Temp 98.2°F | Resp 16 | Ht 64.5 in | Wt 254.8 lb

## 2014-11-13 DIAGNOSIS — Z1239 Encounter for other screening for malignant neoplasm of breast: Secondary | ICD-10-CM

## 2014-11-13 DIAGNOSIS — I10 Essential (primary) hypertension: Secondary | ICD-10-CM

## 2014-11-13 DIAGNOSIS — E049 Nontoxic goiter, unspecified: Secondary | ICD-10-CM

## 2014-11-13 DIAGNOSIS — Z23 Encounter for immunization: Secondary | ICD-10-CM

## 2014-11-13 DIAGNOSIS — Z0000 Encounter for general adult medical examination without abnormal findings: Secondary | ICD-10-CM | POA: Insufficient documentation

## 2014-11-13 DIAGNOSIS — E785 Hyperlipidemia, unspecified: Secondary | ICD-10-CM | POA: Diagnosis not present

## 2014-11-13 DIAGNOSIS — E2839 Other primary ovarian failure: Secondary | ICD-10-CM

## 2014-11-13 LAB — LIPID PANEL
Cholesterol: 224 mg/dL — ABNORMAL HIGH (ref 0–200)
HDL: 107.7 mg/dL (ref >39.00)
LDL Cholesterol: 104 mg/dL — ABNORMAL HIGH (ref 0–99)
NonHDL: 116.3
Total CHOL/HDL Ratio: 2
Triglycerides: 63 mg/dL (ref 0.0–149.0)
VLDL: 12.6 mg/dL (ref 0.0–40.0)

## 2014-11-13 LAB — BASIC METABOLIC PANEL
BUN: 15 mg/dL (ref 6–23)
CO2: 30 mEq/L (ref 19–32)
Calcium: 9.6 mg/dL (ref 8.4–10.5)
Chloride: 104 mEq/L (ref 96–112)
Creatinine, Ser: 0.7 mg/dL (ref 0.4–1.2)
GFR: 109.72 mL/min (ref >60.00)
Glucose, Bld: 87 mg/dL (ref 70–99)
Potassium: 4 mEq/L (ref 3.5–5.1)
Sodium: 141 mEq/L (ref 135–145)

## 2014-11-13 LAB — TSH: TSH: 2.06 u[IU]/mL (ref 0.35–4.50)

## 2014-11-13 NOTE — Progress Notes (Signed)
Subjective:    Patient ID: Julie Powell, female    DOB: 1961-09-01, 54 y.o.   MRN: [id number]  HPI  Julie Powell is a 54 yr old female who presents today for cpx.   Patient presents today for complete physical.  Immunizations: flu shot up to date, had pneumovax in 2012.  Last tetanus: unsure Diet: reports diet is fair- but eating more than should Exercise: reports that she has been trying to walk Colonoscopy: 1/15- due for follow up 2020 Pap Smear: 2014- Dr. Gala Romney Mammogram: 1/14 Dexa scan: never- reports LMP was 2 years ago Subjective:    Julie Powell is a 54 y.o. female who presents for Medicare Initial preventive examination.  Preventive Screening-Counseling & Management  Tobacco History  Smoking status  . Former Smoker -- 0.50 packs/day for 10 years  . Types: Cigarettes  . Quit date: 11/07/1991  Smokeless tobacco  . Never Used    Current providers: Dr. Loanne Drilling endo Dr. De Nurse psych  Current Problems (verified) Patient Active Problem List   Diagnosis Date Noted  . Multinodular goiter 08/27/2014  . Vitamin D deficiency 08/13/2014  . Asthma, chronic 08/13/2014  . Sciatica of right side 08/13/2014  . Major depressive disorder, recurrent, severe w/o psychotic behavior 06/11/2013  . Leiomyoma uteri 06/20/2011  . Hypertension 05/23/2011  . Obesity (BMI 30-39.9) 05/23/2011    Medications Prior to Visit Current Outpatient Prescriptions on File Prior to Visit  Medication Sig Dispense Refill  . albuterol (PROVENTIL HFA;VENTOLIN HFA) 108 (90 BASE) MCG/ACT inhaler Inhale 2 puffs into the lungs every 6 (six) hours as needed. 3 Inhaler 1  . [START ON 01/05/2015] buPROPion (WELLBUTRIN) 100 MG tablet Take 2 tablets (200 mg total) by mouth 2 (two) times daily. 360 tablet 0  . cholecalciferol (VITAMIN D) 1000 UNITS tablet Take 5,000 Units by mouth daily.    . Cyanocobalamin (VITAMIN B-12) 5000 MCG SUBL Place 5,000 mcg under the tongue every other day.     . Flaxseed,  Linseed, (FLAX SEED OIL PO) Take by mouth.    . hyoscyamine (LEVSIN, ANASPAZ) 0.125 MG tablet Take 0.125 mg by mouth as needed.    Marland Kitchen lisinopril-hydrochlorothiazide (PRINZIDE,ZESTORETIC) 10-12.5 MG per tablet Take 1 tablet by mouth daily. 90 tablet 0  . [START ON 01/05/2015] lithium carbonate (LITHOBID) 300 MG CR tablet Take 1 tablet (300 mg total) by mouth 2 (two) times daily. 180 tablet 0  . Multiple Vitamin (MULTIVITAMIN) tablet Take 1 tablet by mouth daily.    Derrill Memo ON 01/05/2015] PARoxetine (PAXIL) 30 MG tablet Take 1 tablet (30 mg total) by mouth every morning. 90 tablet 0   No current facility-administered medications on file prior to visit.    Current Medications (verified) Current Outpatient Prescriptions  Medication Sig Dispense Refill  . albuterol (PROVENTIL HFA;VENTOLIN HFA) 108 (90 BASE) MCG/ACT inhaler Inhale 2 puffs into the lungs every 6 (six) hours as needed. 3 Inhaler 1  . [START ON 01/05/2015] buPROPion (WELLBUTRIN) 100 MG tablet Take 2 tablets (200 mg total) by mouth 2 (two) times daily. 360 tablet 0  . cholecalciferol (VITAMIN D) 1000 UNITS tablet Take 5,000 Units by mouth daily.    . Cyanocobalamin (VITAMIN B-12) 5000 MCG SUBL Place 5,000 mcg under the tongue every other day.     . Flaxseed, Linseed, (FLAX SEED OIL PO) Take by mouth.    . hyoscyamine (LEVSIN, ANASPAZ) 0.125 MG tablet Take 0.125 mg by mouth as needed.    Marland Kitchen lisinopril-hydrochlorothiazide (PRINZIDE,ZESTORETIC) 10-12.5 MG per tablet Take  1 tablet by mouth daily. 90 tablet 0  . [START ON 01/05/2015] lithium carbonate (LITHOBID) 300 MG CR tablet Take 1 tablet (300 mg total) by mouth 2 (two) times daily. 180 tablet 0  . Multiple Vitamin (MULTIVITAMIN) tablet Take 1 tablet by mouth daily.    Derrill Memo ON 01/05/2015] PARoxetine (PAXIL) 30 MG tablet Take 1 tablet (30 mg total) by mouth every morning. 90 tablet 0   No current facility-administered medications for this visit.     Allergies (verified) Review of patient's  allergies indicates no known allergies.   PAST HISTORY  Family History Family History  Problem Relation Age of Onset  . Adopted: Yes  . Aneurysm Father   . Alcohol abuse Father   . Alcohol abuse Brother   . Alcohol abuse Brother   . Cancer Brother     prostate  . Alcohol abuse Brother   . Lung cancer Sister   . Thyroid disease Neg Hx     Social History History  Substance Use Topics  . Smoking status: Former Smoker -- 0.50 packs/day for 10 years    Types: Cigarettes    Quit date: 11/07/1991  . Smokeless tobacco: Never Used  . Alcohol Use: 7.0 oz/week    14 drink(s) per week     Are there smokers in your home (other than you)? no  Risk Factors Current exercise habits: some exercise  Dietary issues discussed: yes   Cardiac risk factors: hypertension.  Depression Screen (Note: if answer to either of the following is "Yes", a more complete depression screening is indicated)   Over the past 2 weeks, have you felt down, depressed or hopeless? Yes  Over the past 2 weeks, have you felt little interest or pleasure in doing things? Yes  Have you lost interest or pleasure in daily life? Yes  Do you often feel hopeless? No  Do you cry easily over simple problems? No  Activities of Daily Living In your present state of health, do you have any difficulty performing the following activities?:  Driving? no Managing money?  no Feeding yourself? no Getting from bed to chair? no Climbing a flight of stairs? No Preparing food and eating?: No Bathing or showering? No Getting dressed: No Getting to the toilet? No Using the toilet:No Moving around from place to place: No In the past year have you fallen or had a near fall?:Yes- attributed to back pain prior to surger   Are you sexually active?  No  Do you have more than one partner?  No  Hearing Difficulties: No Do you often ask people to speak up or repeat themselves? Sometimes- declines referral Do you experience ringing  or noises in your ears? No Do you have difficulty understanding soft or whispered voices? No   Do you feel that you have a problem with memory? Yes- reports bad memory but has "always had a bad memory."   Do you often misplace items? No  Do you feel safe at home?  Yes  Cognitive Testing  Alert? Yes  Normal Appearance?Yes  Oriented to person? Yes  Place? Yes   Time? Yes  Recall of three objects?  Yes  Can perform simple calculations? Yes  Displays appropriate judgment?Yes  Can read the correct time from a watch face?Yes   Advanced Directives have been discussed with the patient? Yes  List the Names of Other Physician/Practitioners you currently use: 1.    Indicate any recent Medical Services you may have received from other than  [facility] providers in the past year (date may be approximate).  Immunization History  Administered Date(s) Administered  . Influenza,inj,Quad PF,36+ Mos 08/12/2014  . Pneumococcal Polysaccharide-23 08/29/2011    Screening Tests Health Maintenance  Topic Date Due  . TETANUS/TDAP  08/20/1982  . MAMMOGRAM  09/04/2013  . PAP SMEAR  09/22/2013  . INFLUENZA VACCINE  06/07/2015  . COLONOSCOPY  11/08/2023    All answers were reviewed with the patient and necessary referrals were made:  O'SULLIVAN,Azure Barrales S., NP   11/13/2014   History reviewed: allergies, current medications, past family history, past medical history, past social history, past surgical history and problem list  Review of Systems Pertinent items are noted in HPI.    Objective:     Vision by Snellen chart: right eye:see nursing, left eye:see nursing  Body mass index is 43.08 kg/(m^2). BP 106/74 mmHg  Pulse 70  Temp(Src) 98.2 F (36.8 C) (Oral)  Resp 16  Ht 5' 4.5" (1.638 m)  Wt 254 lb 12.8 oz (115.577 kg)  BMI 43.08 kg/m2  SpO2 99%  LMP 09/11/2013     Assessment:        Plan:     During the course of the visit the patient was educated and counseled about appropriate  screening and preventive services including:    See below.   Diet review for nutrition referral? Yes ____  Not Indicated __x__   Patient Instructions (the written plan) was given to the patient.  Medicare Attestation I have personally reviewed: The patient's medical and social history Their use of alcohol, tobacco or illicit drugs Their current medications and supplements The patient's functional ability including ADLs,fall risks, home safety risks, cognitive, and hearing and visual impairment Diet and physical activities Evidence for depression or mood disorders  The patient's weight, height, BMI, and visual acuity have been recorded in the chart.  I have made referrals, counseling, and provided education to the patient based on review of the above and I have provided the patient with a written personalized care plan for preventive services.     O'SULLIVAN,Williard Keller S., NP   11/13/2014          Review of Systems See HPI  Past Medical History  Diagnosis Date  . Hypertension   . Bipolar 1 disorder 2010    ect  . Anxiety   . Asthma   . Fibroids   . Plantar fasciitis 2013  . Sciatica     RT hip  . B12 deficiency   . Vitamin D deficiency     History   Social History  . Marital Status: Divorced    Spouse Name: N/A    Number of Children: N/A  . Years of Education: N/A   Occupational History  . Not on file.   Social History Main Topics  . Smoking status: Former Smoker -- 0.50 packs/day for 10 years    Types: Cigarettes    Quit date: 11/07/1991  . Smokeless tobacco: Never Used  . Alcohol Use: 7.0 oz/week    14 drink(s) per week  . Drug Use: No     Comment: Caffeine- Carbonated beverage 16 ounces.  . Sexual Activity: No   Other Topics Concern  . Not on file   Social History Narrative   Lives with 60 year old son   She is on disability due to depression   In the past she has worked as a Education officer, community for [employer]    Former Forensic psychologist  counselor   Completed bachelor's degree   Divorced.   Enjoys television    Past Surgical History  Procedure Laterality Date  . Sp cholecystomy  1986  . Cesarean section  1995  . Gastric bypass  2003  . Cystectomy  1998    "thyroglycol duct cyst in neck" had dysphagia, was told benign  . Pilonidal cyst drainage  1982  . Microdiscectomy lumbar  09/11/14    L3-L4  Dr Saintclair Halsted    Family History  Problem Relation Age of Onset  . Adopted: Yes  . Aneurysm Father   . Alcohol abuse Father   . Alcohol abuse Brother   . Alcohol abuse Brother   . Cancer Brother     prostate  . Alcohol abuse Brother   . Lung cancer Sister   . Thyroid disease Neg Hx     No Known Allergies  Current Outpatient Prescriptions on File Prior to Visit  Medication Sig Dispense Refill  . albuterol (PROVENTIL HFA;VENTOLIN HFA) 108 (90 BASE) MCG/ACT inhaler Inhale 2 puffs into the lungs every 6 (six) hours as needed. 3 Inhaler 1  . [START ON 01/05/2015] buPROPion (WELLBUTRIN) 100 MG tablet Take 2 tablets (200 mg total) by mouth 2 (two) times daily. 360 tablet 0  . cholecalciferol (VITAMIN D) 1000 UNITS tablet Take 5,000 Units by mouth daily.    . Cyanocobalamin (VITAMIN B-12) 5000 MCG SUBL Place 5,000 mcg under the tongue every other day.     . Flaxseed, Linseed, (FLAX SEED OIL PO) Take by mouth.    . hyoscyamine (LEVSIN, ANASPAZ) 0.125 MG tablet Take 0.125 mg by mouth as needed.    Marland Kitchen lisinopril-hydrochlorothiazide (PRINZIDE,ZESTORETIC) 10-12.5 MG per tablet Take 1 tablet by mouth daily. 90 tablet 0  . [START ON 01/05/2015] lithium carbonate (LITHOBID) 300 MG CR tablet Take 1 tablet (300 mg total) by mouth 2 (two) times daily. 180 tablet 0  . Multiple Vitamin (MULTIVITAMIN) tablet Take 1 tablet by mouth daily.    Derrill Memo ON 01/05/2015] PARoxetine (PAXIL) 30 MG tablet Take 1 tablet (30 mg total) by mouth every morning. 90 tablet 0   No current facility-administered medications on  file prior to visit.    BP 106/74 mmHg  Pulse 70  Temp(Src) 98.2 F (36.8 C) (Oral)  Resp 16  Ht 5' 4.5" (1.638 m)  Wt 254 lb 12.8 oz (115.577 kg)  BMI 43.08 kg/m2  SpO2 99%  LMP 09/11/2013       Objective:   Physical Exam  Constitutional: She is oriented to person, place, and time. She appears well-developed and well-nourished. No distress.  HENT:  Head: Normocephalic and atraumatic.  Right Ear: Tympanic membrane and ear canal normal.  Left Ear: Tympanic membrane and ear canal normal.  Cardiovascular: Normal rate and regular rhythm.   No murmur heard. Pulmonary/Chest: Effort normal and breath sounds normal. No respiratory distress. She has no wheezes. She has no rales. She exhibits no tenderness.  Normal breast exam bilaterally- some scarring from previous surgery  Musculoskeletal: She exhibits no edema.  Lymphadenopathy:    She has no cervical adenopathy.  Neurological: She is alert and oriented to person, place, and time.  Psychiatric: She has a normal mood and affect. Her behavior is normal. Judgment and thought content normal.          Assessment & Plan:

## 2014-11-13 NOTE — Progress Notes (Signed)
Pre visit review using our clinic review tool, if applicable. No additional management support is needed unless otherwise documented below in the visit note. 

## 2014-11-13 NOTE — Patient Instructions (Addendum)
Please schedule your mammogram on the first floor. You will be contacted about scheduling your bone density. Complete lab work prior to leaving.  Follow up in 6 months.

## 2014-11-13 NOTE — Assessment & Plan Note (Signed)
Refer for mammogram, dexa, Tdap today.

## 2014-11-17 ENCOUNTER — Encounter: Payer: Self-pay | Admitting: Family

## 2014-11-17 ENCOUNTER — Ambulatory Visit (HOSPITAL_BASED_OUTPATIENT_CLINIC_OR_DEPARTMENT_OTHER)
Admission: RE | Admit: 2014-11-17 | Discharge: 2014-11-17 | Disposition: A | Discharge status: Home / Self Care | Payer: Medicare Other | Source: Ambulatory Visit | Attending: Family | Admitting: Family

## 2014-11-17 DIAGNOSIS — Z1239 Encounter for other screening for malignant neoplasm of breast: Secondary | ICD-10-CM

## 2014-11-17 DIAGNOSIS — Z1231 Encounter for screening mammogram for malignant neoplasm of breast: Secondary | ICD-10-CM | POA: Insufficient documentation

## 2014-11-17 DIAGNOSIS — Z0000 Encounter for general adult medical examination without abnormal findings: Secondary | ICD-10-CM

## 2014-11-18 ENCOUNTER — Ambulatory Visit: Payer: Medicare Other | Attending: Neurosurgery

## 2014-11-18 DIAGNOSIS — M5126 Other intervertebral disc displacement, lumbar region: Secondary | ICD-10-CM | POA: Diagnosis not present

## 2014-11-18 DIAGNOSIS — M545 Low back pain: Secondary | ICD-10-CM | POA: Insufficient documentation

## 2014-11-18 DIAGNOSIS — M6281 Muscle weakness (generalized): Secondary | ICD-10-CM | POA: Insufficient documentation

## 2014-11-25 ENCOUNTER — Ambulatory Visit: Payer: Medicare Other

## 2014-11-25 DIAGNOSIS — M545 Low back pain: Secondary | ICD-10-CM | POA: Diagnosis not present

## 2014-11-25 DIAGNOSIS — M6281 Muscle weakness (generalized): Secondary | ICD-10-CM | POA: Diagnosis not present

## 2014-11-25 DIAGNOSIS — M5126 Other intervertebral disc displacement, lumbar region: Secondary | ICD-10-CM | POA: Diagnosis not present

## 2014-11-26 ENCOUNTER — Ambulatory Visit: Payer: Self-pay | Admitting: Endocrinology

## 2014-11-30 ENCOUNTER — Ambulatory Visit: Payer: Medicare Other | Admitting: Rehabilitation

## 2014-11-30 DIAGNOSIS — M5126 Other intervertebral disc displacement, lumbar region: Secondary | ICD-10-CM | POA: Diagnosis not present

## 2014-11-30 DIAGNOSIS — M6281 Muscle weakness (generalized): Secondary | ICD-10-CM | POA: Diagnosis not present

## 2014-11-30 DIAGNOSIS — M545 Low back pain: Secondary | ICD-10-CM | POA: Diagnosis not present

## 2014-12-02 ENCOUNTER — Ambulatory Visit: Payer: Medicare Other

## 2014-12-02 DIAGNOSIS — M5126 Other intervertebral disc displacement, lumbar region: Secondary | ICD-10-CM | POA: Diagnosis not present

## 2014-12-02 DIAGNOSIS — M6281 Muscle weakness (generalized): Secondary | ICD-10-CM | POA: Diagnosis not present

## 2014-12-02 DIAGNOSIS — M545 Low back pain: Secondary | ICD-10-CM | POA: Diagnosis not present

## 2014-12-07 ENCOUNTER — Ambulatory Visit: Payer: Medicare Other | Attending: Neurosurgery | Admitting: Rehabilitation

## 2014-12-07 DIAGNOSIS — M5126 Other intervertebral disc displacement, lumbar region: Secondary | ICD-10-CM | POA: Diagnosis not present

## 2014-12-07 DIAGNOSIS — M545 Low back pain: Secondary | ICD-10-CM | POA: Insufficient documentation

## 2014-12-07 DIAGNOSIS — M6281 Muscle weakness (generalized): Secondary | ICD-10-CM | POA: Insufficient documentation

## 2014-12-09 ENCOUNTER — Ambulatory Visit: Payer: Medicare Other

## 2014-12-14 ENCOUNTER — Ambulatory Visit: Payer: Medicare Other | Admitting: Rehabilitation

## 2014-12-16 ENCOUNTER — Ambulatory Visit: Payer: Medicare Other

## 2014-12-16 DIAGNOSIS — M545 Low back pain: Secondary | ICD-10-CM | POA: Diagnosis not present

## 2014-12-16 DIAGNOSIS — M5126 Other intervertebral disc displacement, lumbar region: Secondary | ICD-10-CM | POA: Diagnosis not present

## 2014-12-16 DIAGNOSIS — M6281 Muscle weakness (generalized): Secondary | ICD-10-CM | POA: Diagnosis not present

## 2014-12-18 ENCOUNTER — Other Ambulatory Visit: Payer: Self-pay | Admitting: Family

## 2014-12-18 NOTE — Telephone Encounter (Signed)
Rx sent to the pharmacy by e-script.//AB/CMA 

## 2014-12-21 ENCOUNTER — Encounter: Payer: Self-pay | Admitting: Rehabilitation

## 2014-12-29 ENCOUNTER — Ambulatory Visit: Payer: Medicare Other | Admitting: Rehabilitation

## 2014-12-29 DIAGNOSIS — M545 Low back pain, unspecified: Secondary | ICD-10-CM

## 2014-12-29 DIAGNOSIS — M5126 Other intervertebral disc displacement, lumbar region: Secondary | ICD-10-CM | POA: Diagnosis not present

## 2014-12-29 DIAGNOSIS — M6281 Muscle weakness (generalized): Secondary | ICD-10-CM

## 2014-12-29 NOTE — Therapy (Signed)
[facility] [address]  [place], [place], [address] Phone: [phone]   Fax:  [phone]  Physical Therapy Treatment  Patient Details  Name: Julie Powell MRN: [id number] Date of Birth: 09/19/1961 Referring Provider:  Elaina Hoops, MD  Encounter Date: 12/29/2014      PT End of Session - 12/29/14 1012    Visit Number 8   Number of Visits 12   Date for PT Re-Evaluation 12/30/14   PT Start Time 0931   PT Stop Time 1011   PT Time Calculation (min) 40 min      Past Medical History  Diagnosis Date  . Hypertension   . Bipolar 1 disorder 2010    ect  . Anxiety   . Asthma   . Fibroids   . Plantar fasciitis 2013  . Sciatica     RT hip  . B12 deficiency   . Vitamin D deficiency     Past Surgical History  Procedure Laterality Date  . Sp cholecystomy  1986  . Cesarean section  1995  . Gastric bypass  2003  . Cystectomy  1998    "thyroglycol duct cyst in neck" had dysphagia, was told benign  . Pilonidal cyst drainage  1982  . Microdiscectomy lumbar  09/11/14    L3-L4  Dr Saintclair Halsted    LMP 09/11/2013  Visit Diagnosis:  Generalized muscle weakness  Bilateral low back pain without sciatica      Subjective Assessment - 12/29/14 0934    Symptoms Reports she hasn't had much pain. Says she has a 3/10 when first waking up in the morning that eases off as she moves. Sometimes feels it when standing up.    Limitations Lifting  Steps    Currently in Pain? No/denies          [facility] PT Assessment - 12/29/14 0001    Assessment   Next MD Visit --  Not scheduled yet   ROM / Strength   AROM / PROM / Strength Strength;AROM   AROM   AROM Assessment Site Lumbar   Lumbar Flexion 6.5"  from floor   Lumbar Extension 80% normal   Lumbar - Right Side Bend 1 inch from knee   Lumbar - Left Side Bend 1 inch from knee   Lumbar - Right Rotation WNL   Lumbar - Left Rotation WNL   Strength   Strength Assessment Site  Knee;Ankle;Hip   Right/Left Hip Right;Left   Right Hip Flexion 4-/5   Right Hip ABduction 3/5  Able to hold resistance in minimal ROM   Left Hip Flexion 4-/5   Left Hip ABduction 3/5  Able to hold resistance in minimal ROM   Right/Left Knee Right;Left   Right Knee Flexion 5/5   Right Knee Extension 4/5   Left Knee Flexion 5/5   Left Knee Extension 4+/5   Right/Left Ankle Right;Left   Right Ankle Dorsiflexion 4/5   Left Ankle Dorsiflexion 5/5                  [facility] Adult PT Treatment/Exercise - 12/29/14 0001    Exercises   Exercises Lumbar;Knee/Hip   Lumbar Exercises: Aerobic   Stationary Bike Nustep level 4 x 7 UE/LE   Lumbar Exercises: Seated   Long Arc Quad on [place] Right;Left;10 reps  Green pball   Hip Flexion on Ball Right;Left;10 reps  Green pball   Other Seated Lumbar Exercises Overhead and Diagonal lifts x10 each  Yellow  med ball, sitting on table   Knee/Hip Exercises: Standing   Hip ADduction Both;10 reps;2 sets  2#, chair assist    Forward Step Up Both;Step Height: 6";10 reps;Hand Hold: 0   Other Standing Knee Exercises Sidestepping with red TB at ankles 2x57ft   Other Standing Knee Exercises Marching x 10, both  2#, chair assist                     PT Long Term Goals - 12/29/14 0936    PT LONG TERM GOAL #1   Title demonstrate and/or verbalize technique to reduce the risk of re-injury to include info on posture, body mechanics, lifting. (12/30/2014)   Time 6   Period Weeks   Status On-going   PT LONG TERM GOAL #2   Title be independent with advanced HEP. (12/30/2014)   Time 6   Period Weeks   Status On-going   PT LONG TERM GOAL #3   Title report pain decrease to <4/10 with activity and position changes. (12/30/2014)   Time 6   Period Weeks   Status Achieved   PT LONG TERM GOAL #4   Title improve lower extremity to at least 4/5 for improved function. (12/30/2014)   Time 6   Period Weeks   Status On-going                Plan - 12/29/14 1012    Clinical Impression Statement Patient tolerated increased weight with standing activities well. Noted some LBP with pball exercises. Strength and ROM have increased since evaluation.    Pt will benefit from skilled therapeutic intervention in order to improve on the following deficits Decreased range of motion;Decreased strength;Decreased balance;Pain   Rehab Potential Good   PT Frequency 2x / week   PT Duration 6 weeks   PT Treatment/Interventions ADLs/Self Care Home Management;Cryotherapy;Electrical Stimulation;Moist Heat;Ultrasound;Passive range of motion;Manual techniques;Patient/family education;Therapeutic exercise;Therapeutic activities;Traction;Neuromuscular re-education   PT Next Visit Plan Renewal vs discharge. Continue core and LE strengthening   Consulted and Agree with Plan of Care Patient        Problem List Patient Active Problem List   Diagnosis Date Noted  . Preventative health care 11/13/2014  . Multinodular goiter 08/27/2014  . Vitamin D deficiency 08/13/2014  . Asthma, chronic 08/13/2014  . Sciatica of right side 08/13/2014  . Major depressive disorder, recurrent, severe w/o psychotic behavior 06/11/2013  . Leiomyoma uteri 06/20/2011  . Hypertension 05/23/2011  . Obesity (BMI 30-39.9) 05/23/2011    Erlene Senters J PTA 12/29/2014, 10:16 AM  [facility] [address]  [place], [place], [address] Phone: [phone]   Fax:  [phone]

## 2014-12-31 ENCOUNTER — Ambulatory Visit (INDEPENDENT_AMBULATORY_CARE_PROVIDER_SITE_OTHER): Payer: Medicare Other | Admitting: Endocrinology

## 2014-12-31 ENCOUNTER — Encounter: Payer: Self-pay | Admitting: Endocrinology

## 2014-12-31 VITALS — BP 132/84 | HR 81 | Temp 98.1°F | Wt 260.0 lb

## 2014-12-31 DIAGNOSIS — E538 Deficiency of other specified B group vitamins: Secondary | ICD-10-CM | POA: Diagnosis not present

## 2014-12-31 DIAGNOSIS — E559 Vitamin D deficiency, unspecified: Secondary | ICD-10-CM | POA: Diagnosis not present

## 2014-12-31 DIAGNOSIS — Z9884 Bariatric surgery status: Secondary | ICD-10-CM | POA: Diagnosis not present

## 2014-12-31 LAB — VITAMIN B12: Vitamin B-12: >1500 pg/mL — ABNORMAL HIGH (ref 211–911)

## 2014-12-31 LAB — VITAMIN D 25 HYDROXY (VIT D DEFICIENCY, FRACTURES): VITD: 44.96 ng/mL (ref 30.00–100.00)

## 2014-12-31 NOTE — Progress Notes (Signed)
Subjective:    Patient ID: [name], female    DOB: 22-Jan-1961, 54 y.o.   MRN: [id number]  HPI  The state of at least three ongoing medical problems is addressed today, with interval history of each noted here: Pt returns for f/u of a small multinodular goiter (dx'ed 2015; he had resection of a thyroglossal duct cyst in 1998; she had gastric bypass surgery in 2003).  She does not notice the goiter.   Morbid obesity: she has not recently taken B-12 shots.  Denies weight change Vit-D deficiency: denies cramps Past Medical History  Diagnosis Date  . Hypertension   . Bipolar 1 disorder 2010    ect  . Anxiety   . Asthma   . Fibroids   . Plantar fasciitis 2013  . Sciatica     RT hip  . B12 deficiency   . Vitamin D deficiency     Past Surgical History  Procedure Laterality Date  . Sp cholecystomy  1986  . Cesarean section  1995  . Gastric bypass  2003  . Cystectomy  1998    "thyroglycol duct cyst in neck" had dysphagia, was told benign  . Pilonidal cyst drainage  1982  . Microdiscectomy lumbar  09/11/14    L3-L4  Dr Saintclair Halsted    History   Social History  . Marital Status: Divorced    Spouse Name: N/A  . Number of Children: N/A  . Years of Education: N/A   Occupational History  . Not on file.   Social History Main Topics  . Smoking status: Former Smoker -- 0.50 packs/day for 10 years    Types: Cigarettes    Quit date: 11/07/1991  . Smokeless tobacco: Never Used  . Alcohol Use: 7.0 oz/week    14 drink(s) per week  . Drug Use: No     Comment: Caffeine- Carbonated beverage 16 ounces.  . Sexual Activity: No   Other Topics Concern  . Not on file   Social History Narrative   Lives with 57 year old son   She is on disability due to depression   In the past she has worked as a Education officer, community for [employer] [place]   Former Mud[employer]   Completed bachelor's degree   Divorced.   Enjoys television    Current  Outpatient Prescriptions on File Prior to Visit  Medication Sig Dispense Refill  . albuterol (PROVENTIL HFA;VENTOLIN HFA) 108 (90 BASE) MCG/ACT inhaler Inhale 2 puffs into the lungs every 6 (six) hours as needed. 3 Inhaler 1  . [START ON 01/05/2015] buPROPion (WELLBUTRIN) 100 MG tablet Take 2 tablets (200 mg total) by mouth 2 (two) times daily. 360 tablet 0  . cholecalciferol (VITAMIN D) 1000 UNITS tablet Take 5,000 Units by mouth daily.    . Cyanocobalamin (VITAMIN B-12) 5000 MCG SUBL Place 5,000 mcg under the tongue every other day.     . Flaxseed, Linseed, (FLAX SEED OIL PO) Take by mouth.    . hyoscyamine (LEVSIN, ANASPAZ) 0.125 MG tablet Take 0.125 mg by mouth as needed.    Marland Kitchen lisinopril-hydrochlorothiazide (PRINZIDE,ZESTORETIC) 10-12.5 MG per tablet take 1 tablet by mouth once daily 90 tablet 1  . [START ON 01/05/2015] lithium carbonate (LITHOBID) 300 MG CR tablet Take 1 tablet (300 mg total) by mouth 2 (two) times daily. 180 tablet 0  . Multiple Vitamin (MULTIVITAMIN) tablet Take 1 tablet by mouth daily.    Derrill Memo ON 01/05/2015] PARoxetine (PAXIL) 30 MG tablet  Take 1 tablet (30 mg total) by mouth every morning. 90 tablet 0   No current facility-administered medications on file prior to visit.    No Known Allergies  Family History  Problem Relation Age of Onset  . Adopted: Yes  . Aneurysm Father   . Alcohol abuse Father   . Alcohol abuse Brother   . Alcohol abuse Brother   . Cancer Brother     prostate  . Alcohol abuse Brother   . Lung cancer Sister   . Thyroid disease Neg Hx     BP 132/84 mmHg  Pulse 81  Temp(Src) 98.1 F (36.7 C) (Oral)  Wt 260 lb (117.935 kg)  SpO2 96%  LMP 09/11/2013  Review of Systems Denies diarrhea and numbness    Objective:   Physical Exam VITAL SIGNS:  See vs page GENERAL: no distress NECK: There is slight palpable thyroid enlargement, but i can't tell details.  No thyroid nodule is palpable.  No palpable lymphadenopathy at the anterior neck.      Lab Results  Component Value Date   TSH 2.06 11/13/2014   Lab Results  Component Value Date   PTH 86* 12/31/2014   CALCIUM 9.4 12/31/2014   Vit=normal    Assessment & Plan:  Morbid obesity, persistent after surgery Multinodular goiter.  Euthyroid. Hyperparathyroidism, mild, prob secondary.    Patient is advised the following: Patient Instructions  Please come back for a follow-up appointment in 1 year.  blood tests are being requested for you today.  We'll let you know about the results.    most of the time, a "lumpy thyroid" will eventually become overactive.  this is usually a slow process, happening over the span of many years. Please see a surgery specialist.  you will receive a phone call, about a day and time for an appointment.    addendum: same meds.

## 2014-12-31 NOTE — Patient Instructions (Addendum)
Please come back for a follow-up appointment in 1 year.  blood tests are being requested for you today.  We'll let you know about the results.    most of the time, a "lumpy thyroid" will eventually become overactive.  this is usually a slow process, happening over the span of many years. Please see a surgery specialist.  you will receive a phone call, about a day and time for an appointment.

## 2015-01-01 ENCOUNTER — Encounter: Payer: Self-pay | Admitting: Physical Therapy

## 2015-01-01 LAB — PTH, INTACT AND CALCIUM
Calcium: 9.4 mg/dL (ref 8.4–10.5)
PTH: 86 pg/mL — ABNORMAL HIGH (ref 14–64)

## 2015-01-06 ENCOUNTER — Ambulatory Visit: Payer: Medicare Other | Attending: Neurosurgery | Admitting: Physical Therapy

## 2015-01-06 DIAGNOSIS — M5126 Other intervertebral disc displacement, lumbar region: Secondary | ICD-10-CM | POA: Insufficient documentation

## 2015-01-06 DIAGNOSIS — M545 Low back pain, unspecified: Secondary | ICD-10-CM

## 2015-01-06 DIAGNOSIS — M6281 Muscle weakness (generalized): Secondary | ICD-10-CM | POA: Diagnosis not present

## 2015-01-06 NOTE — Patient Instructions (Signed)

## 2015-01-06 NOTE — Therapy (Signed)
[facility] [address]  [place], [place], [address] Phone: [phone]   Fax:  [phone]  Physical Therapy Treatment  Patient Details  Name: Julie Powell MRN: [id number] Date of Birth: 12/01/1960 Referring Provider:  Elaina Hoops, MD  Encounter Date: 01/06/2015      PT End of Session - 01/06/15 1356    Visit Number 9   Number of Visits 12   Date for PT Re-Evaluation 12/30/14   PT Start Time 2841   PT Stop Time 1357   PT Time Calculation (min) 38 min   Activity Tolerance Patient tolerated treatment well   Behavior During Therapy [facility] for tasks assessed/performed      Past Medical History  Diagnosis Date  . Hypertension   . Bipolar 1 disorder 2010    ect  . Anxiety   . Asthma   . Fibroids   . Plantar fasciitis 2013  . Sciatica     RT hip  . B12 deficiency   . Vitamin D deficiency     Past Surgical History  Procedure Laterality Date  . Sp cholecystomy  1986  . Cesarean section  1995  . Gastric bypass  2003  . Cystectomy  1998    "thyroglycol duct cyst in neck" had dysphagia, was told benign  . Pilonidal cyst drainage  1982  . Microdiscectomy lumbar  09/11/14    L3-L4  Dr Saintclair Halsted    LMP 09/11/2013  Visit Diagnosis:  Generalized muscle weakness - Plan: PT plan of care cert/re-cert  Bilateral low back pain without sciatica - Plan: PT plan of care cert/re-cert      Subjective Assessment - 01/06/15 1320    Symptoms Feels good; ready for d/c and transition to community fitness.   Currently in Pain? No/denies  only early in AM; otherwise no pain          [facility] PT Assessment - 01/06/15 1344    Observation/Other Assessments   Focus on Therapeutic Outcomes (FOTO)  61 (39% limited)   Strength   Right Hip Flexion 4-/5   Right Hip ABduction 4/5   Left Hip Flexion 4/5   Left Hip ABduction 4/5                  [facility] Adult PT Treatment/Exercise - 01/06/15 1327    Lumbar Exercises:  Aerobic   Stationary Bike Nustep level 4 x 10 min UE/LE   Lumbar Exercises: Supine   Ab Set 10 reps;5 seconds   Clam 10 reps   Clam Limitations with ab set x 10; with green theraband x 20   Heel Slides 10 reps   Heel Slides Limitations with ab set   Bent Knee Raise 10 reps   Bent Knee Raise Limitations with ab set   Bridge 10 reps   Straight Leg Raise 10 reps                PT Education - 01/06/15 1356    Education provided Yes   Education Details posture/body Office manager; community fitness   Person(s) Educated Patient   Methods Explanation;Handout   Comprehension Verbalized understanding             PT Long Term Goals - 01/06/15 1357    PT LONG TERM GOAL #1   Title demonstrate and/or verbalize technique to reduce the risk of re-injury to include info on posture, body mechanics, lifting. (12/30/2014)   Status Achieved   PT LONG TERM GOAL #  2   Title be independent with advanced HEP. (12/30/2014)   Status Achieved   PT LONG TERM GOAL #3   Title report pain decrease to <4/10 with activity and position changes. (12/30/2014)   Status Achieved   PT LONG TERM GOAL #4   Title improve lower extremity to at least 4/5 for improved function. (12/30/2014)   Status Achieved               Plan - 02/05/2015 1357    Clinical Impression Statement Pt has met all goals and ready for d/c and transition to HEP and community fitness.     PT Next Visit Plan d/c PT today   Consulted and Agree with Plan of Care Patient          G-Codes - February 05, 2015 1404    Functional Assessment Tool Used FOTO 39% limited   Functional Limitation Mobility: Walking and moving around   Mobility: Walking and Moving Around Goal Status [phone]) At least 20 percent but less than 40 percent impaired, limited or restricted   Mobility: Walking and Moving Around Discharge Status [phone]) At least 20 percent but less than 40 percent impaired, limited or restricted      Problem List Patient Active Problem  List   Diagnosis Date Noted  . Bariatric surgery status 12/31/2014  . Vitamin B 12 deficiency 12/31/2014  . Preventative health care 11/13/2014  . Multinodular goiter 08/27/2014  . Vitamin D deficiency 08/13/2014  . Asthma, chronic 08/13/2014  . Sciatica of right side 08/13/2014  . Major depressive disorder, recurrent, severe w/o psychotic behavior 06/11/2013  . Leiomyoma uteri 06/20/2011  . Hypertension 05/23/2011  . Obesity (BMI 30-39.9) 05/23/2011   Laureen Abrahams, PT, DPT 02/05/15 2:06 PM  [facility] [address]  [place], [place], [address] Phone: [phone]   Fax:  [phone]   PHYSICAL THERAPY DISCHARGE SUMMARY  Visits from Start of Care: 9  Current functional level related to goals / functional outcomes: See above all goals met   Remaining deficits: Mild R sided weakness not currently affecting function; pt has HEP and access to fitness center to continue exercise.   Education / Equipment: HEP, posture/body mechanics  Plan: Patient agrees to discharge.  Patient goals were met. Patient is being discharged due to meeting the stated rehab goals.  ?????   Laureen Abrahams, PT, DPT 02-05-2015 2:07 PM  [facility] [place], [place] [address]  [phone] (office) [phone] (fax)

## 2015-01-22 DIAGNOSIS — M5137 Other intervertebral disc degeneration, lumbosacral region: Secondary | ICD-10-CM | POA: Diagnosis not present

## 2015-01-25 ENCOUNTER — Other Ambulatory Visit: Payer: Self-pay

## 2015-02-09 ENCOUNTER — Ambulatory Visit (HOSPITAL_COMMUNITY): Payer: Self-pay | Admitting: Psychiatry

## 2015-02-18 DIAGNOSIS — Z13820 Encounter for screening for osteoporosis: Secondary | ICD-10-CM | POA: Diagnosis not present

## 2015-02-23 ENCOUNTER — Encounter (HOSPITAL_COMMUNITY): Payer: Self-pay | Admitting: Psychiatry

## 2015-02-23 ENCOUNTER — Ambulatory Visit (INDEPENDENT_AMBULATORY_CARE_PROVIDER_SITE_OTHER): Payer: Medicare Other | Admitting: Psychiatry

## 2015-02-23 VITALS — BP 110/70 | HR 80 | Ht 64.0 in | Wt 260.0 lb

## 2015-02-23 DIAGNOSIS — F4010 Social phobia, unspecified: Secondary | ICD-10-CM | POA: Diagnosis not present

## 2015-02-23 DIAGNOSIS — F332 Major depressive disorder, recurrent severe without psychotic features: Secondary | ICD-10-CM

## 2015-02-23 NOTE — Progress Notes (Signed)
Patient ID: Tom Ragsdale, female   DOB: August 13, 1961, 53 y.o.   MRN: [id number] [place] Follow-up Outpatient Visit  Nija Koopman [birth year]   Date: 11/10/2013  Chief Complaint  Patient presents with  . Follow-up    HPI Comments: Ms. Lague is a 54  Y/O woman with a past psychiatric history Major Depressive Disorder, severe, recurrent, and Social Phobia.  The patient is referred for psychiatric services for  medication management.   . Location: She reports she her mood remains same. Some down days but not hopeless. Has seen dr.Puthavel in march. Was able to get refil from her primary care. Feels not worse but spring brings pollen which keeps her mostly inside. She will follow with endocrinologist.  Recently she had L4/5 herniated disc surgery . Now recovering.  No side effects reported. She gets a 90 day supply of meds. No suicidal toughts.   . Quality:   Depression wise she is baseline hopelessness or suicidal homicidal thoughts. She is less anxious but still has less social interaction with other people.  . Severity: Depression: 6/10 (0=Very depressed; 5=Neutral; 10=Very Happy)-  Anxiety- 5/10 (0=no anxiety; 5= moderate/tolerable anxiety; 10= panic attacks)  . Duration: Since age 82, had a suicide attempt at age 58.   . Timing: Worse in the evenings   . Context: She perceives herself failure. Low self esteem.  . Modifying factors: Patient   . Associated signs and symptoms: No delusions hallucinations or manic symptoms   Traumatic Brain Injury: No none   Review of Systems  Musculoskeletal: Positive for myalgias and back pain.  Skin: Negative for rash.  Neurological: Negative for headaches.  Psychiatric/Behavioral: Positive for depression. Negative for suicidal ideas.   Filed Vitals:   02/23/15 1520  BP: 110/70  Pulse: 80  Height: 5\' 4"  (1.626 m)  Weight: 260 lb (117.935 kg)   Physical Exam  Constitutional: She appears well-developed and  well-nourished. No distress.  Skin: She is not diaphoretic.  No tremors of hands noted while patient was interviewed.  Musculoskeletal: Gait & Station: normal Patient leans: N/A   Past Psychiatric History: Reviewed   Diagnosis: Major Depressive Disorder, recurrent, severe   Hospitalizations: Several since age 89   Outpatient Care: Since   Substance Abuse Care: none   Self-Mutilation: Patient denies   Suicidal Attempts: Three attempts   Violent Behaviors: Patient denies.    Past Medical History: Reviewed  Past Medical History  Diagnosis Date  . Hypertension   . Bipolar 1 disorder 2010    ect  . Anxiety   . Asthma   . Fibroids   . Plantar fasciitis 2013  . Sciatica     RT hip  . B12 deficiency   . Vitamin D deficiency    History of Loss of Consciousness: No  Seizure History: No  Cardiac History: Yes-HTN  Allergies: No Known Allergies   Current Medications: Reviewed   Current Outpatient Prescriptions on File Prior to Visit  Medication Sig Dispense Refill  . albuterol (PROVENTIL HFA;VENTOLIN HFA) 108 (90 BASE) MCG/ACT inhaler Inhale 2 puffs into the lungs every 6 (six) hours as needed. 3 Inhaler 1  . buPROPion (WELLBUTRIN) 100 MG tablet Take 2 tablets (200 mg total) by mouth 2 (two) times daily. 360 tablet 0  . cholecalciferol (VITAMIN D) 1000 UNITS tablet Take 5,000 Units by mouth daily.    . Cyanocobalamin (VITAMIN B-12) 5000 MCG SUBL Place 5,000 mcg under the tongue every other day.     . Flaxseed, Linseed, (FLAX  SEED OIL PO) Take by mouth.    . hyoscyamine (LEVSIN, ANASPAZ) 0.125 MG tablet Take 0.125 mg by mouth as needed.    Marland Kitchen lisinopril-hydrochlorothiazide (PRINZIDE,ZESTORETIC) 10-12.5 MG per tablet take 1 tablet by mouth once daily 90 tablet 1  . lithium carbonate (LITHOBID) 300 MG CR tablet Take 1 tablet (300 mg total) by mouth 2 (two) times daily. 180 tablet 0  . Multiple Vitamin (MULTIVITAMIN) tablet Take 1 tablet by mouth daily.    Marland Kitchen PARoxetine (PAXIL) 30 MG  tablet Take 1 tablet (30 mg total) by mouth every morning. 90 tablet 0   No current facility-administered medications on file prior to visit.   Previous Psychotropic Medications: Reviewed  Medication   Bupropion   Paxil   Lithium   Lamictal-possible rash   Depakote-"spacey"   Prozac-choking sensation   Geodon-didn't work.   Aripiprazole-didn't want to take.     Family History: Reviewed  Family History   Problem  Relation  Age of Onset   .  Adopted: Yes   .  Aneurysm  Father     Psychiatric Specialty Exam:  Objective: Appearance: Well Groomed   Eye Contact:: Good   Speech: Clear and Coherent and Normal Rate   Volume: Normal   Mood: more better then bad days. Not hopeless  Affect: Appropriate, Congruent and Restricted   Thought Process: Goal Directed, Logical and Loose   Orientation: Full   Thought Content: WDL   Suicidal Thoughts: Patient denies   Homicidal Thoughts: Patient denies   Judgement: Fair   Insight: Fair   Psychomotor Activity: Normal   Akathisia: No   Handed: Right   Memory: Immediate 3/3, Recent: 1/3   [place] of knowledge-average to above average.  AIMS (if indicated): Not indicated at this time.   Assets: Communication Skills  Desire for Improvement    Assessment:  AXIS I: Major Depressive Disorder, severe, recurrent--stable Social Phobia-slightly improving   Treatment Plan/Recommendations:   Plan of Care:  PLAN:  1. Affirm with the patient that the medications are taken as ordered. Patient  expressed understanding of how their medications were to be used.    Laboratory:  reveiwed  Psychotherapy: Therapy: brief supportive therapy provided. Discussed psychosocial stressors. Again, encouraged patient to continue spending time daily making changes to 4 important areas of her life. More than 50% of the visit was spent on individual therapy.   Medications:  Continue the following psychiatric medications as written prior to this  appointment with the following changes::  She has had refills on her lithium, wellbutrin and paxil.  Advised to avoid alcohol intake,.  She will call for refills when its due.  Will consider lithium level next visit. Last one reviewed.  -Risks and benefits, side effects and alternatives discussed with patient, she was given an opportunity to ask questions about her medication, illness, and treatment. All current psychiatric medications have been reviewed and discussed with the patient and adjusted as clinically appropriate. The patient has been provided an accurate and updated list of the medications being now prescribed.   Routine PRN Medications:  Negative  Consultations: The patient was encouraged to keep all PCP and specialty clinic appointments.   Safety Concerns:   Patient told to call clinic if any problems occur. Patient advised to go to  ER  if she should develop SI/HI, side effects, or if symptoms worsen. Has crisis numbers to call if needed.    Other:   8. Patient was instructed to return to clinic  in 3 months.  9. The patient was advised to call and cancel their mental health appointment within 24 hours of the appointment, if they are unable to keep the appointment, as well as the three no show and termination from clinic policy. 10. The patient expressed understanding of the plan and agrees with the above. Fu in 3 months.    Time Spent: 30 minutes Merian Capron, M.D.  02/23/2015 3:29 PM

## 2015-03-03 ENCOUNTER — Encounter: Payer: Self-pay | Admitting: Family

## 2015-03-08 ENCOUNTER — Encounter: Payer: Self-pay | Admitting: Family

## 2015-03-16 ENCOUNTER — Encounter: Payer: Self-pay | Admitting: Family

## 2015-04-19 ENCOUNTER — Other Ambulatory Visit: Payer: Self-pay | Admitting: Family

## 2015-04-28 ENCOUNTER — Ambulatory Visit (INDEPENDENT_AMBULATORY_CARE_PROVIDER_SITE_OTHER): Payer: Medicare Other | Admitting: Physician Assistant

## 2015-04-28 ENCOUNTER — Encounter: Payer: Self-pay | Admitting: Physician Assistant

## 2015-04-28 ENCOUNTER — Telehealth: Payer: Self-pay | Admitting: *Deleted

## 2015-04-28 VITALS — BP 118/70 | HR 64 | Temp 98.1°F | Resp 18 | Ht 64.0 in | Wt 261.6 lb

## 2015-04-28 DIAGNOSIS — H60391 Other infective otitis externa, right ear: Secondary | ICD-10-CM | POA: Diagnosis not present

## 2015-04-28 DIAGNOSIS — H60399 Other infective otitis externa, unspecified ear: Secondary | ICD-10-CM | POA: Insufficient documentation

## 2015-04-28 MED ORDER — CIPROFLOXACIN-DEXAMETHASONE 0.3-0.1 % OT SUSP: 4.0000 [drp] | Freq: Two times a day (BID) | OTIC | Status: DC

## 2015-04-28 NOTE — Assessment & Plan Note (Signed)
Rx Ciprodex BID x 7 days.  Supportive measures reviewed.  Follow-up if not resolving.

## 2015-04-28 NOTE — Telephone Encounter (Signed)
Response faxed successfully. Sent for scanning. JG//CMA

## 2015-04-28 NOTE — Telephone Encounter (Signed)
Please fax response given to pharmacy.

## 2015-04-28 NOTE — Telephone Encounter (Signed)
Received fax from [facility] stating that pt's co-pay for ciprodex otic suspension is $56.00. Cheaper alternative includes cortisporin otic suspension. Please advise. I have placed this fax in your red folder. Thanks, JG//CMA

## 2015-04-28 NOTE — Progress Notes (Signed)
Patient presents to clinic today c/o R ear itching, drainage, swelling and pain.  Symptoms have been present over the past few months but pain just started 2-3 days ago.Denies change in hearing. Has + history of Otitis Externa treated previously with Ciprodex.  Denies fever, chills,URI symptoms. Denies similar symptoms of L ear.   Past Medical History  Diagnosis Date  . Hypertension   . Bipolar 1 disorder 2010    ect  . Anxiety   . Asthma   . Fibroids   . Plantar fasciitis 2013  . Sciatica     RT hip  . B12 deficiency   . Vitamin D deficiency     Current Outpatient Prescriptions on File Prior to Visit  Medication Sig Dispense Refill  . buPROPion (WELLBUTRIN) 100 MG tablet Take 2 tablets (200 mg total) by mouth 2 (two) times daily. 360 tablet 0  . cholecalciferol (VITAMIN D) 1000 UNITS tablet Take 5,000 Units by mouth daily.    . Cyanocobalamin (VITAMIN B-12) 5000 MCG SUBL Place 5,000 mcg under the tongue every other day.     . Flaxseed, Linseed, (FLAX SEED OIL PO) Take by mouth.    . hyoscyamine (LEVSIN, ANASPAZ) 0.125 MG tablet Take 0.125 mg by mouth as needed.    Marland Kitchen lisinopril-hydrochlorothiazide (PRINZIDE,ZESTORETIC) 10-12.5 MG per tablet take 1 tablet by mouth once daily 90 tablet 1  . lithium carbonate (LITHOBID) 300 MG CR tablet Take 1 tablet (300 mg total) by mouth 2 (two) times daily. 180 tablet 0  . Multiple Vitamin (MULTIVITAMIN) tablet Take 1 tablet by mouth daily.    Marland Kitchen PARoxetine (PAXIL) 30 MG tablet Take 1 tablet (30 mg total) by mouth every morning. 90 tablet 0  . PROAIR HFA 108 (90 BASE) MCG/ACT inhaler inhale 2 puffs by mouth every 6 hours if needed 25.5 g 1   No current facility-administered medications on file prior to visit.    No Known Allergies  Family History  Problem Relation Age of Onset  . Adopted: Yes  . Aneurysm Father   . Alcohol abuse Father   . Alcohol abuse Brother   . Alcohol abuse Brother   . Cancer Brother     prostate  . Alcohol  abuse Brother   . Lung cancer Sister   . Thyroid disease Neg Hx     History   Social History  . Marital Status: Divorced    Spouse Name: N/A  . Number of Children: N/A  . Years of Education: N/A   Social History Main Topics  . Smoking status: Former Smoker -- 0.50 packs/day for 10 years    Types: Cigarettes    Quit date: 11/07/1991  . Smokeless tobacco: Never Used  . Alcohol Use: 7.0 oz/week    14 drink(s) per week  . Drug Use: No     Comment: Caffeine- Carbonated beverage 16 ounces.  . Sexual Activity: No   Other Topics Concern  . 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   Enjoys television   Review of Systems - See HPI.  All other ROS are negative.  BP 118/70 mmHg  Pulse 64  Temp(Src) 98.1 F (36.7 C) (Oral)  Resp 18  Ht  5\' 4"  (1.626 m)  Wt 261 lb 9.6 oz (118.661 kg)  BMI 44.88 kg/m2  SpO2 98%  LMP 09/11/2013  Physical Exam  Constitutional: She is oriented to person, place, and time and well-developed, well-nourished, and in no distress.  HENT:  Head: Normocephalic and atraumatic.  Right Ear: Tympanic membrane normal. There is drainage, swelling and tenderness.  Left Ear: Tympanic membrane, external ear and ear canal normal.  Cardiovascular: Normal rate, regular rhythm, normal heart sounds and intact distal pulses.   Pulmonary/Chest: Effort normal and breath sounds normal. No respiratory distress. She has no wheezes. She has no rales. She exhibits no tenderness.  Neurological: She is alert and oriented to person, place, and time.  Skin: Skin is warm and dry. No rash noted.  Psychiatric: Affect normal.  Vitals reviewed.    Assessment/Plan: Otitis, externa, infective Rx Ciprodex BID x 7 days.   Supportive measures reviewed.  Follow-up if not resolving.

## 2015-04-28 NOTE — Progress Notes (Signed)
Pre visit review using our clinic review tool, if applicable. No additional management support is needed unless otherwise documented below in the visit note. 

## 2015-04-28 NOTE — Patient Instructions (Signed)
Please use the Ciprodex as directed for 7 days. Keep ears clean and dry. Place cotton in the ears before showering to prevent water from getting in the canals. Call or return to clinic if symptoms are not improving.  Otitis Externa Otitis externa is a germ infection in the outer ear. The outer ear is the area from the eardrum to the outside of the ear. Otitis externa is sometimes called "swimmer's ear." HOME CARE  Put drops in the ear as told by your doctor.  Only take medicine as told by your doctor.  If you have diabetes, your doctor may give you more directions. Follow your doctor's directions.  Keep all doctor visits as told. To avoid another infection:  Keep your ear dry. Use the corner of a towel to dry your ear after swimming or bathing.  Avoid scratching or putting things inside your ear.  Avoid swimming in lakes, dirty water, or pools that use a chemical called chlorine poorly.  You may use ear drops after swimming. Combine equal amounts of white vinegar and alcohol in a bottle. Put 3 or 4 drops in each ear. GET HELP IF:   You have a fever.  Your ear is still red, puffy (swollen), or painful after 3 days.  You still have yellowish-white fluid (pus) coming from the ear after 3 days.  Your redness, puffiness, or pain gets worse.  You have a really bad headache.  You have redness, puffiness, pain, or tenderness behind your ear. MAKE SURE YOU:   Understand these instructions.  Will watch your condition.  Will get help right away if you are not doing well or get worse. Document Released: 04/10/2008 Document Revised: 03/09/2014 Document Reviewed: 11/09/2011 [facility] Patient Information 2015 [place], [place]. This information is not intended to replace advice given to you by your health care provider. Make sure you discuss any questions you have with your health care provider.

## 2015-05-11 ENCOUNTER — Other Ambulatory Visit (HOSPITAL_COMMUNITY): Payer: Self-pay | Admitting: Psychiatry

## 2015-05-12 NOTE — Telephone Encounter (Signed)
Received fax from [facility] for a refill for Wellbutrin 100mg  and Lithium 300mg . Per Dr. De Nurse, pt is authorized for a refill for Wellbutrin 100mg , Qty 360 and Lithium 300mg , Qty 180. Prescriptions were sent to pharmacy. Pt has a f/u appt on 7/19.  Called and informed pt of prescription status. Pt verbalizes understanding.

## 2015-05-13 ENCOUNTER — Encounter: Payer: Self-pay | Admitting: Family

## 2015-05-13 ENCOUNTER — Ambulatory Visit (INDEPENDENT_AMBULATORY_CARE_PROVIDER_SITE_OTHER): Payer: Medicare Other | Admitting: Family

## 2015-05-13 VITALS — BP 100/70 | HR 68 | Temp 98.0°F | Resp 16 | Ht 64.5 in | Wt 262.0 lb

## 2015-05-13 DIAGNOSIS — E785 Hyperlipidemia, unspecified: Secondary | ICD-10-CM | POA: Insufficient documentation

## 2015-05-13 DIAGNOSIS — I10 Essential (primary) hypertension: Secondary | ICD-10-CM | POA: Diagnosis not present

## 2015-05-13 DIAGNOSIS — E559 Vitamin D deficiency, unspecified: Secondary | ICD-10-CM

## 2015-05-13 DIAGNOSIS — H60391 Other infective otitis externa, right ear: Secondary | ICD-10-CM | POA: Diagnosis not present

## 2015-05-13 DIAGNOSIS — F332 Major depressive disorder, recurrent severe without psychotic features: Secondary | ICD-10-CM

## 2015-05-13 LAB — BASIC METABOLIC PANEL
BUN: 12 mg/dL (ref 6–23)
CO2: 28 mEq/L (ref 19–32)
Calcium: 9.9 mg/dL (ref 8.4–10.5)
Chloride: 104 mEq/L (ref 96–112)
Creatinine, Ser: 0.88 mg/dL (ref 0.40–1.20)
GFR: 86.87 mL/min (ref >60.00)
Glucose, Bld: 84 mg/dL (ref 70–99)
Potassium: 4.1 mEq/L (ref 3.5–5.1)
Sodium: 140 mEq/L (ref 135–145)

## 2015-05-13 LAB — LIPID PANEL
Cholesterol: 205 mg/dL — ABNORMAL HIGH (ref 0–200)
HDL: 94.9 mg/dL (ref >39.00)
LDL Cholesterol: 100 mg/dL — ABNORMAL HIGH (ref 0–99)
NonHDL: 110.1
Total CHOL/HDL Ratio: 2
Triglycerides: 51 mg/dL (ref 0.0–149.0)
VLDL: 10.2 mg/dL (ref 0.0–40.0)

## 2015-05-13 LAB — VITAMIN D 25 HYDROXY (VIT D DEFICIENCY, FRACTURES): VITD: 35.34 ng/mL (ref 30.00–100.00)

## 2015-05-13 MED ORDER — LISINOPRIL 10 MG PO TABS: 10.0000 mg | ORAL_TABLET | Freq: Every day | ORAL | Status: DC

## 2015-05-13 MED ORDER — CIPROFLOXACIN-DEXAMETHASONE 0.3-0.1 % OT SUSP: 4.0000 [drp] | Freq: Two times a day (BID) | OTIC | Status: DC

## 2015-05-13 NOTE — Patient Instructions (Addendum)
Stop lisinopril-hctz, start lisinopril 10mg  once daily. Repeat ciprodex drops right ear.  Follow up in 1 month for nurse visit BP check. Follow up in 4 months for routine office visit.

## 2015-05-13 NOTE — Progress Notes (Signed)
Pre visit review using our clinic review tool, if applicable. No additional management support is needed unless otherwise documented below in the visit note. 

## 2015-05-13 NOTE — Assessment & Plan Note (Signed)
Ear canal flushed by CMA, repeat ciprodex drops.

## 2015-05-13 NOTE — Assessment & Plan Note (Signed)
BP appears overtreated.  Will change lisinopril-hctz to lisinopril.

## 2015-05-13 NOTE — Progress Notes (Signed)
Subjective:    Patient ID: Julie Powell, female    DOB: Mar 11, 1961, 54 y.o.   MRN: [id number]  HPI  Julie Powell is a 54 yr old female who presents for follow up of multiple medical problems.  HTN- Patient is currently maintained on the following medications for blood pressure: lisinopril-hctz Patient reports good compliance with blood pressure medications. Patient denies chest pain, shortness of breath or swelling. Reports occasional dizziness/lightheadeness Last 3 blood pressure readings in our office are as follows: BP Readings from Last 3 Encounters:  05/13/15 100/70  04/28/15 118/70  02/23/15 110/70   Hyperlipidemia-  Trying to watch her diet.   Lab Results  Component Value Date   CHOL 224* 11/13/2014   HDL 107.70 11/13/2014   LDLCALC 104* 11/13/2014   TRIG 63.0 11/13/2014   CHOLHDL 2 11/13/2014   Vit D deficiency- maintianed on otc vit D.    Depression- seeing Julie Powell. Depression is unchanged.   Otitis externa- seen 6/22 and symptoms improved.    Review of Systems See HPI  Past Medical History  Diagnosis Date  . Hypertension   . Bipolar 1 disorder 2010    ect  . Anxiety   . Asthma   . Fibroids   . Plantar fasciitis 2013  . Sciatica     RT hip  . B12 deficiency   . Vitamin D deficiency     History   Social History  . Marital Status: Divorced    Spouse Name: N/A  . Number of Children: N/A  . Years of Education: N/A   Occupational History  . Not on file.   Social History Main Topics  . Smoking status: Former Smoker -- 0.50 packs/day for 10 years    Types: Cigarettes    Quit date: 11/07/1991  . Smokeless tobacco: Never Used  . Alcohol Use: 7.0 oz/week    14 drink(s) per week  . Drug Use: No     Comment: Caffeine- Carbonated beverage 16 ounces.  . Sexual Activity: No   Other Topics Concern  . 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   Enjoys television    Past Surgical History  Procedure Laterality Date  . Sp cholecystomy  1986  . Cesarean section  1995  . Gastric bypass  2003  . Cystectomy  1998    "thyroglycol duct cyst in neck" had dysphagia, was told benign  . Pilonidal cyst drainage  1982  . Microdiscectomy lumbar  09/11/14    L3-L4  Dr Saintclair Halsted    Family History  Problem Relation Age of Onset  . Adopted: Yes  . Aneurysm Father   . Alcohol abuse Father   . Alcohol abuse Brother   . Alcohol abuse Brother   . Cancer Brother     prostate  . Alcohol abuse Brother   . Lung cancer Sister   . Thyroid disease Neg Hx     No Known Allergies  Current Outpatient Prescriptions on File Prior to Visit  Medication Sig Dispense Refill  . buPROPion (WELLBUTRIN) 100 MG tablet take 2 tablets by mouth twice a day 360 tablet 0  . cholecalciferol (VITAMIN D) 1000 UNITS tablet Take 5,000 Units by mouth daily.    Marland Kitchen  Cyanocobalamin (VITAMIN B-12) 5000 MCG SUBL Place 5,000 mcg under the tongue every other day.     . Flaxseed, Linseed, (FLAX SEED OIL PO) Take by mouth.    . hyoscyamine (LEVSIN, ANASPAZ) 0.125 MG tablet Take 0.125 mg by mouth as needed.    Marland Kitchen lisinopril-hydrochlorothiazide (PRINZIDE,ZESTORETIC) 10-12.5 MG per tablet take 1 tablet by mouth once daily 90 tablet 1  . lithium carbonate (LITHOBID) 300 MG CR tablet take 1 tablet by mouth twice a day 180 tablet 0  . Multiple Vitamin (MULTIVITAMIN) tablet Take 1 tablet by mouth daily.    Marland Kitchen PARoxetine (PAXIL) 30 MG tablet Take 1 tablet (30 mg total) by mouth every morning. 90 tablet 0  . PROAIR HFA 108 (90 BASE) MCG/ACT inhaler inhale 2 puffs by mouth every 6 hours if needed 25.5 g 1   No current facility-administered medications on file prior to visit.    BP 100/70 mmHg  Pulse 68  Temp(Src) 98 F (36.7 C)  (Oral)  Resp 16  Ht 5' 4.5" (1.638 m)  Wt 262 lb (118.842 kg)  BMI 44.29 kg/m2  SpO2 97%  LMP 09/11/2013       Objective:   Physical Exam  Constitutional: She appears well-developed and well-nourished.  HENT:  R ear canal appears mildly irritated with fair amount of cerumen  Cardiovascular: Normal rate, regular rhythm and normal heart sounds.   No murmur heard. Pulmonary/Chest: Effort normal and breath sounds normal. No respiratory distress. She has no wheezes.  Musculoskeletal: She exhibits no edema.  Psychiatric: Her behavior is normal. Judgment and thought content normal.  Slightly flat affect but pleasant          Assessment & Plan:

## 2015-05-13 NOTE — Assessment & Plan Note (Signed)
Continue supplement. Obtain vit D level.

## 2015-05-13 NOTE — Assessment & Plan Note (Signed)
Unchanged.  Defer management to psychiatry.

## 2015-05-13 NOTE — Assessment & Plan Note (Signed)
Obtain flp  

## 2015-05-14 ENCOUNTER — Ambulatory Visit: Payer: Self-pay | Admitting: Family

## 2015-05-16 ENCOUNTER — Encounter: Payer: Self-pay | Admitting: Family

## 2015-05-25 ENCOUNTER — Encounter (HOSPITAL_COMMUNITY): Payer: Self-pay | Admitting: Psychiatry

## 2015-05-25 ENCOUNTER — Ambulatory Visit (INDEPENDENT_AMBULATORY_CARE_PROVIDER_SITE_OTHER): Payer: Medicare Other | Admitting: Psychiatry

## 2015-05-25 VITALS — HR 82 | Ht 64.0 in | Wt 262.0 lb

## 2015-05-25 DIAGNOSIS — E669 Obesity, unspecified: Secondary | ICD-10-CM | POA: Diagnosis not present

## 2015-05-25 DIAGNOSIS — F0630 Mood disorder due to known physiological condition, unspecified: Secondary | ICD-10-CM

## 2015-05-25 DIAGNOSIS — F4010 Social phobia, unspecified: Secondary | ICD-10-CM

## 2015-05-25 DIAGNOSIS — F331 Major depressive disorder, recurrent, moderate: Secondary | ICD-10-CM

## 2015-05-25 DIAGNOSIS — F332 Major depressive disorder, recurrent severe without psychotic features: Secondary | ICD-10-CM | POA: Diagnosis not present

## 2015-05-25 MED ORDER — LITHIUM CARBONATE ER 300 MG PO TBCR: 300.0000 mg | EXTENDED_RELEASE_TABLET | Freq: Two times a day (BID) | ORAL | Status: DC

## 2015-05-25 MED ORDER — BUPROPION HCL 100 MG PO TABS: 200.0000 mg | ORAL_TABLET | Freq: Two times a day (BID) | ORAL | Status: DC

## 2015-05-25 MED ORDER — PAROXETINE HCL 30 MG PO TABS: 30.0000 mg | ORAL_TABLET | ORAL | Status: DC

## 2015-05-25 NOTE — Progress Notes (Signed)
Patient ID: Julie Powell, female   DOB: 1961/02/22, 54 y.o.   MRN: [id number] [place] Follow-up Outpatient Visit  Avabella Wailes [birth year]   Date: 05/25/2015  Chief Complaint  Patient presents with  . Follow-up  . Medication Refill    HPI Comments: Ms. Whiters is a 54  Y/O woman with a past psychiatric history Major Depressive Disorder, severe, recurrent. Mood disorder due to [facility] and Social Phobia.  The patient is referred for psychiatric services for  medication management.   . Location: She reports she her mood remains same. Some down days but not hopeless.    Recently she had L4/5 herniated disc surgery . Now recovering.  No side effects reported. She gets a 90 day supply of meds. No suicidal toughts.   . Quality:   Depression wise she is baseline hopelessness or suicidal homicidal thoughts. She is less anxious but still has less social interaction with other people. Remains concern of her obesity as she has fluctuation of weight despite Gastric bypass in 2002. She avoid family says what would they say about my weight. . Severity: Depression: 6/10 (0=Very depressed; 5=Neutral; 10=Very Happy)-  Anxiety- 5/10 (0=no anxiety; 5= moderate/tolerable anxiety; 10= panic attacks)  . Duration: Since age 19, had a suicide attempt at age 59.   . Timing: Worse in the evenings   . Context: She perceives herself failure. Low self esteem.  . Modifying factors: medications, her son  . Associated signs and symptoms: No delusions hallucinations or manic symptoms   Traumatic Brain Injury: No none   Review of Systems  Constitutional: Negative for fever.  Musculoskeletal: Positive for myalgias and back pain.  Skin: Negative for rash.  Neurological: Negative for tremors and headaches.  Psychiatric/Behavioral: Positive for depression. Negative for suicidal ideas.   Filed Vitals:   05/25/15 1458  Pulse: 82  Height: 5\' 4"  (1.626 m)  Weight: 262 lb (118.842 kg)    Physical Exam  Constitutional: She appears well-developed and well-nourished. No distress.  Skin: She is not diaphoretic.  No tremors of hands noted while patient was interviewed.  Musculoskeletal: Gait & Station: normal Patient leans: N/A   Past Psychiatric History: Reviewed   Diagnosis: Major Depressive Disorder, recurrent, severe   Hospitalizations: Several since age 64   Outpatient Care: Since   Substance Abuse Care: none   Self-Mutilation: Patient denies   Suicidal Attempts: Three attempts   Violent Behaviors: Patient denies.    Past Medical History: Reviewed  Past Medical History  Diagnosis Date  . Hypertension   . Bipolar 1 disorder 2010    ect  . Anxiety   . Asthma   . Fibroids   . Plantar fasciitis 2013  . Sciatica     RT hip  . B12 deficiency   . Vitamin D deficiency    History of Loss of Consciousness: No  Seizure History: No  Cardiac History: Yes-HTN  Allergies: No Known Allergies   Current Medications: Reviewed   Current Outpatient Prescriptions on File Prior to Visit  Medication Sig Dispense Refill  . cholecalciferol (VITAMIN D) 1000 UNITS tablet Take 5,000 Units by mouth daily.    . ciprofloxacin-dexamethasone (CIPRODEX) otic suspension Place 4 drops into the right ear 2 (two) times daily. 7.5 mL 0  . Cyanocobalamin (VITAMIN B-12) 5000 MCG SUBL Place 5,000 mcg under the tongue every other day.     . Flaxseed, Linseed, (FLAX SEED OIL PO) Take by mouth.    . hyoscyamine (LEVSIN, ANASPAZ) 0.125 MG tablet Take  0.125 mg by mouth as needed.    Marland Kitchen lisinopril (PRINIVIL,ZESTRIL) 10 MG tablet Take 1 tablet (10 mg total) by mouth daily. 90 tablet 0  . Multiple Vitamin (MULTIVITAMIN) tablet Take 1 tablet by mouth daily.    Marland Kitchen PROAIR HFA 108 (90 BASE) MCG/ACT inhaler inhale 2 puffs by mouth every 6 hours if needed 25.5 g 1   No current facility-administered medications on file prior to visit.      Family History: Reviewed  Family History   Problem   Relation  Age of Onset   .  Adopted: Yes   .  Aneurysm  Father     Psychiatric Specialty Exam:  Objective: Appearance: Well Groomed   Eye Contact:: Good   Speech: Clear and Coherent and Normal Rate   Volume: Normal   Mood: more better then bad days. Not hopeless  Affect: Appropriate, Congruent and Restricted   Thought Process: Goal Directed, Logical and Loose   Orientation: Full   Thought Content: WDL   Suicidal Thoughts: Patient denies   Homicidal Thoughts: Patient denies   Judgement: Fair   Insight: Fair   Psychomotor Activity: Normal   Akathisia: No   Handed: Right   Memory: Immediate 3/3, Recent: 1/3   [place] of knowledge-average to above average.  AIMS (if indicated): Not indicated at this time.   Assets: Communication Skills  Desire for Improvement    Assessment:  AXIS I: Major Depressive Disorder, severe, recurrent-- not worsen Social Phobia-slightly improving. Mood symptoms due to GMC/back problems/obesity   Treatment Plan/Recommendations:   Plan of Care:  PLAN:  1. Affirm with the patient that the medications are taken as ordered. Patient  expressed understanding of how their medications were to be used.    Laboratory:  reveiwed  Psychotherapy: Therapy: brief supportive therapy provided. Discussed psychosocial stressors. Again, encouraged patient to continue spending time daily making changes to 4 important areas of her life. More than 50% of the visit was spent on individual therapy.   Medications:  Continue the following psychiatric medications as written prior to this appointment with the following changes::  Refills sent for lithium, paxil and wellbutrin. Will request lithium levels Advised to avoid alcohol intake,.   -Risks and benefits, side effects and alternatives discussed with patient, she was given an opportunity to ask questions about her medication, illness, and treatment. All current psychiatric medications have been reviewed and  discussed with the patient and adjusted as clinically appropriate. The patient has been provided an accurate and updated list of the medications being now prescribed.   Routine PRN Medications:  Negative  Consultations: The patient was encouraged to keep all PCP and specialty clinic appointments.   Safety Concerns:   Patient told to call clinic if any problems occur. Patient advised to go to  ER  if she should develop SI/HI, side effects, or if symptoms worsen. Has crisis numbers to call if needed.    Other:   8. Patient was instructed to return to clinic in 3 months.  9. The patient was advised to call and cancel their mental health appointment within 24 hours of the appointment, if they are unable to keep the appointment, as well as the three no show and termination from clinic policy. 10. The patient expressed understanding of the plan and agrees with the above. Fu in 3 months.    Time Spent: 25 minutes Merian Capron, M.D.  05/25/2015 3:10 PM

## 2015-06-03 DIAGNOSIS — F332 Major depressive disorder, recurrent severe without psychotic features: Secondary | ICD-10-CM | POA: Diagnosis not present

## 2015-06-04 LAB — LITHIUM LEVEL: Lithium Lvl: 0.5 mEq/L — ABNORMAL LOW (ref 0.80–1.40)

## 2015-06-17 ENCOUNTER — Ambulatory Visit: Payer: Medicare Other

## 2015-06-17 VITALS — BP 119/62

## 2015-06-17 DIAGNOSIS — I10 Essential (primary) hypertension: Secondary | ICD-10-CM

## 2015-06-17 NOTE — Progress Notes (Signed)
Pre visit review using our clinic review tool, if applicable. No additional management support is needed unless otherwise documented below in the visit note. 

## 2015-08-11 ENCOUNTER — Other Ambulatory Visit: Payer: Self-pay | Admitting: Family

## 2015-08-26 ENCOUNTER — Ambulatory Visit (HOSPITAL_COMMUNITY): Payer: Self-pay | Admitting: Psychiatry

## 2015-08-31 ENCOUNTER — Encounter (HOSPITAL_COMMUNITY): Payer: Self-pay | Admitting: Psychiatry

## 2015-08-31 ENCOUNTER — Ambulatory Visit (INDEPENDENT_AMBULATORY_CARE_PROVIDER_SITE_OTHER): Payer: Medicare Other | Admitting: Psychiatry

## 2015-08-31 VITALS — BP 126/78 | HR 70 | Ht 64.0 in | Wt 264.0 lb

## 2015-08-31 DIAGNOSIS — F332 Major depressive disorder, recurrent severe without psychotic features: Secondary | ICD-10-CM | POA: Diagnosis not present

## 2015-08-31 DIAGNOSIS — F4010 Social phobia, unspecified: Secondary | ICD-10-CM

## 2015-08-31 DIAGNOSIS — E669 Obesity, unspecified: Secondary | ICD-10-CM

## 2015-08-31 DIAGNOSIS — F331 Major depressive disorder, recurrent, moderate: Secondary | ICD-10-CM

## 2015-08-31 MED ORDER — PAROXETINE HCL 30 MG PO TABS: 30.0000 mg | ORAL_TABLET | ORAL | Status: DC

## 2015-08-31 MED ORDER — LITHIUM CARBONATE ER 300 MG PO TBCR: 300.0000 mg | EXTENDED_RELEASE_TABLET | Freq: Two times a day (BID) | ORAL | Status: DC

## 2015-08-31 MED ORDER — BUPROPION HCL 100 MG PO TABS: 200.0000 mg | ORAL_TABLET | Freq: Two times a day (BID) | ORAL | Status: DC

## 2015-08-31 NOTE — Progress Notes (Signed)
Patient ID: Julie Powell, female   DOB: 11/16/1960, 54 y.o.   MRN: [id number] [place] Follow-up Outpatient Visit  Julie Powell Sep 08, 1961   Date: 08/31/2015  Chief Complaint  Patient presents with  . Follow-up    HPI Comments: Ms. Caissie is a 54  Y/O woman with a past psychiatric history Major Depressive Disorder, severe, recurrent. Mood disorder due to [facility] and Social Phobia.  The patient is referred for psychiatric services for  medication management.   . Location: She reports she her mood remains same. Some down days but not hopeless.     She had a back surgery 6 months ago she is recovering. Mood wise she remains the same. Says that she does have some blah days. Says that she is mostly like this and does not feel happiness although she keeps herself busy with her life she stressed towards treatment because she does not want to socialize.  Also feels uncomfortable around new people and avoid change.   . Quality:   Depression wise she is baseline hopelessness or suicidal homicidal thoughts. She is less anxious but still has less social interaction with other people. Remains concern of her obesity as she has fluctuation of weight despite Gastric bypass in 2002. She avoid family says what would they say about my weight. . Severity: Depression: 6/10 (0=Very depressed; 5=Neutral; 10=Very Happy)-  Anxiety- 5/10 (0=no anxiety; 5= moderate/tolerable anxiety; 10= panic attacks)  . Duration: Since age 54, had a suicide attempt at age 43.   . Timing: Worse in the evenings   . Context: She perceives herself with low self esteem.   . Modifying factors: medications, her son  . Associated signs and symptoms: No delusions hallucinations or manic symptoms   Traumatic Brain Injury: No none   Review of Systems  Constitutional: Negative for fever.  Musculoskeletal: Positive for myalgias and back pain.  Skin: Negative for rash.  Neurological: Negative for tingling and  headaches.  Psychiatric/Behavioral: Positive for depression. Negative for suicidal ideas.   Filed Vitals:   08/31/15 1310  BP: 126/78  Pulse: 70  Height: 5\' 4"  (1.626 m)  Weight: 264 lb (119.75 kg)  SpO2: 97%   Physical Exam  Constitutional: She appears well-developed and well-nourished. No distress.  Skin: She is not diaphoretic.  No tremors of hands noted while patient was interviewed.  Musculoskeletal: Gait & Station: normal Patient leans: N/A   Past Psychiatric History: Reviewed   Diagnosis: Major Depressive Disorder, recurrent, severe   Hospitalizations: Several since age 42   Outpatient Care: Since   Substance Abuse Care: none   Self-Mutilation: Patient denies   Suicidal Attempts: Three attempts   Violent Behaviors: Patient denies.    Past Medical History: Reviewed  Past Medical History  Diagnosis Date  . Hypertension   . Bipolar 1 disorder ([place]) 2010    ect  . Anxiety   . Asthma   . Fibroids   . Plantar fasciitis 2013  . Sciatica     RT hip  . B12 deficiency   . Vitamin D deficiency    History of Loss of Consciousness: No  Seizure History: No  Cardiac History: Yes-HTN  Allergies: No Known Allergies   Current Medications: Reviewed   Current Outpatient Prescriptions on File Prior to Visit  Medication Sig Dispense Refill  . cholecalciferol (VITAMIN D) 1000 UNITS tablet Take 5,000 Units by mouth daily.    . ciprofloxacin-dexamethasone (CIPRODEX) otic suspension Place 4 drops into the right ear 2 (two) times daily.  7.5 mL 0  . Flaxseed, Linseed, (FLAX SEED OIL PO) Take by mouth.    . hyoscyamine (LEVSIN, ANASPAZ) 0.125 MG tablet Take 0.125 mg by mouth as needed.    Marland Kitchen lisinopril (PRINIVIL,ZESTRIL) 10 MG tablet take 1 tablet by mouth daily 90 tablet 1  . Multiple Vitamin (MULTIVITAMIN) tablet Take 1 tablet by mouth daily.    Marland Kitchen PROAIR HFA 108 (90 BASE) MCG/ACT inhaler inhale 2 puffs by mouth every 6 hours if needed 25.5 g 1  . Cyanocobalamin (VITAMIN B-12)  5000 MCG SUBL Place 5,000 mcg under the tongue every other day.      No current facility-administered medications on file prior to visit.      Family History: Reviewed  Family History   Problem  Relation  Age of Onset   .  Adopted: Yes   .  Aneurysm  Father     Psychiatric Specialty Exam:  Objective: Appearance: Well Groomed   Eye Contact:: Good   Speech: Clear and Coherent and Normal Rate   Volume: Normal   Mood: more better then bad days. Not hopeless  Affect: Appropriate, Congruent and Restricted   Thought Process: Goal Directed, Logical and Loose   Orientation: Full   Thought Content: WDL   Suicidal Thoughts: Patient denies   Homicidal Thoughts: Patient denies   Judgement: Fair   Insight: Fair   Psychomotor Activity: Normal   Akathisia: No   Handed: Right   Memory: Immediate 3/3, Recent: 1/3   [place] of knowledge-average to above average.  AIMS (if indicated): Not indicated at this time.   Assets: Communication Skills  Desire for Improvement    Assessment:  AXIS I: Major Depressive Disorder, severe, recurrent-- not worsen Social Phobia-slightly improving. Mood symptoms due to GMC/back problems/obesity   Treatment Plan/Recommendations:   Plan of Care:  PLAN:  1. Affirm with the patient that the medications are taken as ordered. Patient  expressed understanding of how their medications were to be used.    Laboratory:  reveiwed  Psychotherapy: Therapy: brief supportive therapy provided. Discussed psychosocial stressors. Again, encouraged patient to continue spending time daily making changes to 4 important areas of her life. More than 50% of the visit was spent on individual therapy.   Medications:  Continue the following psychiatric medications as written prior to this appointment with the following changes::  Refills sent for lithium, paxil and wellbutrin. Lithium level reviewed. 0,5 Advised to avoid alcohol intake,.   -Risks and benefits,  side effects and alternatives discussed with patient, she was given an opportunity to ask questions about her medication, illness, and treatment. All current psychiatric medications have been reviewed and discussed with the patient and adjusted as clinically appropriate. The patient has been provided an accurate and updated list of the medications being now prescribed.   Routine PRN Medications:  Negative  Consultations: The patient was encouraged to keep all PCP and specialty clinic appointments.   Safety Concerns:   Patient told to call clinic if any problems occur. Patient advised to go to  ER  if she should develop SI/HI, side effects, or if symptoms worsen. Has crisis numbers to call if needed.    Other:   8. Patient was instructed to return to clinic in 3 months.  9. The patient was advised to call and cancel their mental health appointment within 24 hours of the appointment, if they are unable to keep the appointment, as well as the three no show and termination from clinic policy.  10. The patient expressed understanding of the plan and agrees with the above. Fu in 3 months.    Time Spent: 25 minutes Merian Capron, M.D.  08/31/2015 1:49 PM

## 2015-09-22 ENCOUNTER — Encounter: Payer: Self-pay | Admitting: Family

## 2015-09-22 ENCOUNTER — Ambulatory Visit (INDEPENDENT_AMBULATORY_CARE_PROVIDER_SITE_OTHER): Payer: Medicare Other | Admitting: Family

## 2015-09-22 VITALS — BP 110/78 | HR 72 | Temp 97.8°F | Resp 16 | Ht 64.5 in | Wt 266.4 lb

## 2015-09-22 DIAGNOSIS — Z23 Encounter for immunization: Secondary | ICD-10-CM

## 2015-09-22 DIAGNOSIS — I10 Essential (primary) hypertension: Secondary | ICD-10-CM | POA: Diagnosis not present

## 2015-09-22 DIAGNOSIS — R002 Palpitations: Secondary | ICD-10-CM | POA: Diagnosis not present

## 2015-09-22 LAB — CBC WITH DIFFERENTIAL/PLATELET
Basophils Absolute: 0 10*3/uL (ref 0.0–0.1)
Basophils Relative: 0.7 % (ref 0.0–3.0)
Eosinophils Absolute: 0.3 10*3/uL (ref 0.0–0.7)
Eosinophils Relative: 7.8 % — ABNORMAL HIGH (ref 0.0–5.0)
HCT: 39.1 % (ref 36.0–46.0)
Hemoglobin: 12.9 g/dL (ref 12.0–15.0)
Lymphocytes Relative: 26.4 % (ref 12.0–46.0)
Lymphs Abs: 1.1 10*3/uL (ref 0.7–4.0)
MCHC: 32.9 g/dL (ref 30.0–36.0)
MCV: 94.6 fl (ref 78.0–100.0)
Monocytes Absolute: 0.3 10*3/uL (ref 0.1–1.0)
Monocytes Relative: 7.6 % (ref 3.0–12.0)
Neutro Abs: 2.4 10*3/uL (ref 1.4–7.7)
Neutrophils Relative %: 57.5 % (ref 43.0–77.0)
Platelets: 201 10*3/uL (ref 150.0–400.0)
RBC: 4.13 Mil/uL (ref 3.87–5.11)
RDW: 13.9 % (ref 11.5–15.5)
WBC: 4.3 10*3/uL (ref 4.0–10.5)

## 2015-09-22 LAB — BASIC METABOLIC PANEL
BUN: 13 mg/dL (ref 6–23)
CO2: 29 mEq/L (ref 19–32)
Calcium: 9.7 mg/dL (ref 8.4–10.5)
Chloride: 103 mEq/L (ref 96–112)
Creatinine, Ser: 0.82 mg/dL (ref 0.40–1.20)
GFR: 94.12 mL/min (ref >60.00)
Glucose, Bld: 84 mg/dL (ref 70–99)
Potassium: 4 mEq/L (ref 3.5–5.1)
Sodium: 139 mEq/L (ref 135–145)

## 2015-09-22 LAB — T3, FREE: T3, Free: 2.6 pg/mL (ref 2.3–4.2)

## 2015-09-22 LAB — T4, FREE: Free T4: 0.7 ng/dL (ref 0.60–1.60)

## 2015-09-22 LAB — TSH: TSH: 2.89 u[IU]/mL (ref 0.35–4.50)

## 2015-09-22 NOTE — Progress Notes (Signed)
Subjective:    Patient ID: Julie Powell, female    DOB: [birth year], 54 y.o.   MRN: [id number]  HPI  Julie Powell is a 54 yr old female who presents today for follow up of her HTN.  She reports some intermittent palpitations.  Last episode was 1 week ago, occurred at rest, lasted 10 minutes. She denies CP or SOB.  BP Readings from Last 3 Encounters:  09/22/15 110/78  08/31/15 126/78  06/17/15 119/62   She does report som intermittent epigastric pain- uses hyoscamine. Happens about every 6 weeks. She sees [facility] in [place] and will schedule follow up.   Review of Systems See HPI  Past Medical History  Diagnosis Date  . Hypertension   . Bipolar 1 disorder ([place]) 2010    ect  . Anxiety   . Asthma   . Fibroids   . Plantar fasciitis 2013  . Sciatica     RT hip  . B12 deficiency   . Vitamin D deficiency     Social History   Social History  . Marital Status: Divorced    Spouse Name: N/A  . Number of Children: N/A  . Years of Education: N/A   Occupational History  . Not on file.   Social History Main Topics  . Smoking status: Former Smoker -- 0.50 packs/day for 10 years    Types: Cigarettes    Quit date: 11/07/1991  . Smokeless tobacco: Never Used  . Alcohol Use: 7.0 oz/week    14 drink(s) per week  . Drug Use: No     Comment: Caffeine- Carbonated beverage 16 ounces.  . Sexual Activity: No   Other Topics Concern  . Not on file   Social History Narrative   Lives with 53 year old son   She is on disability due to depression   In the past she has worked as a Education officer, community for state of [place]   Former [employer]   Completed bachelor's degree   Divorced.   Enjoys television    Past Surgical History  Procedure Laterality Date  . Sp cholecystomy  1986  . Cesarean section  1995  . Gastric bypass  2003  . Cystectomy  1998    "thyroglycol duct cyst in neck" had dysphagia, was told benign    . Pilonidal cyst drainage  1982  . Microdiscectomy lumbar  09/11/14    L3-L4  Dr Saintclair Halsted    Family History  Problem Relation Age of Onset  . Adopted: Yes  . Aneurysm Father   . Alcohol abuse Father   . Alcohol abuse Brother   . Alcohol abuse Brother   . Cancer Brother     prostate  . Alcohol abuse Brother   . Lung cancer Sister   . Thyroid disease Neg Hx     No Known Allergies  Current Outpatient Prescriptions on File Prior to Visit  Medication Sig Dispense Refill  . buPROPion (WELLBUTRIN) 100 MG tablet Take 2 tablets (200 mg total) by mouth 2 (two) times daily. 360 tablet 0  . cholecalciferol (VITAMIN D) 1000 UNITS tablet Take 5,000 Units by mouth daily.    . ciprofloxacin-dexamethasone (CIPRODEX) otic suspension Place 4 drops into the right ear 2 (two) times daily. 7.5 mL 0  . Cyanocobalamin (VITAMIN B-12) 5000 MCG SUBL Place 5,000 mcg under the tongue every other day.     . Flaxseed, Linseed, (FLAX SEED OIL PO) Take by mouth.    . hyoscyamine (  LEVSIN, ANASPAZ) 0.125 MG tablet Take 0.125 mg by mouth as needed.    Marland Kitchen lisinopril (PRINIVIL,ZESTRIL) 10 MG tablet take 1 tablet by mouth daily 90 tablet 1  . lithium carbonate (LITHOBID) 300 MG CR tablet Take 1 tablet (300 mg total) by mouth 2 (two) times daily. 180 tablet 0  . Multiple Vitamin (MULTIVITAMIN) tablet Take 1 tablet by mouth daily.    Marland Kitchen PARoxetine (PAXIL) 30 MG tablet Take 1 tablet (30 mg total) by mouth every morning. 90 tablet 0  . PROAIR HFA 108 (90 BASE) MCG/ACT inhaler inhale 2 puffs by mouth every 6 hours if needed 25.5 g 1   No current facility-administered medications on file prior to visit.    BP 110/78 mmHg  Pulse 72  Temp(Src) 97.8 F (36.6 C) (Oral)  Resp 16  Ht 5' 4.5" (1.638 m)  Wt 266 lb 6.4 oz (120.838 kg)  BMI 45.04 kg/m2  LMP 09/11/2013       Objective:   Physical Exam  Constitutional: She is oriented to person, place, and time. She appears well-developed and well-nourished.  HENT:  Head:  Normocephalic and atraumatic.  Cardiovascular: Normal rate, regular rhythm and normal heart sounds.   No murmur heard. Pulmonary/Chest: Effort normal and breath sounds normal. No respiratory distress. She has no wheezes.  Musculoskeletal: She exhibits no edema.  Neurological: She is alert and oriented to person, place, and time.  Psychiatric: She has a normal mood and affect. Her behavior is normal. Judgment and thought content normal.          Assessment & Plan:  Palpitations- mild, intermittent.  Will obtain TFT's to rule out hyperthyroid, CBC to exclude anemia and bmet to assess electrolytes. EKG tracing is personally reviewed.  EKG notes NSR.  No acute changes.   Flu shot today If lab work is unremarkable and sxs persist, consider holter monitor.

## 2015-09-22 NOTE — Patient Instructions (Addendum)
Please schedule a follow up with your GI doctor- Dr. Jake Church Complete lab work prior to leaving. Call if you develop increased frequency or severity of palpitations.

## 2015-09-22 NOTE — Assessment & Plan Note (Signed)
BP stable on current meds. Continue same,obtain follow up bmet.  

## 2015-09-22 NOTE — Progress Notes (Signed)
Pre visit review using our clinic review tool, if applicable. No additional management support is needed unless otherwise documented below in the visit note. 

## 2015-10-21 DIAGNOSIS — R1084 Generalized abdominal pain: Secondary | ICD-10-CM | POA: Diagnosis not present

## 2015-11-30 ENCOUNTER — Encounter (HOSPITAL_COMMUNITY): Payer: Self-pay | Admitting: Psychiatry

## 2015-11-30 ENCOUNTER — Ambulatory Visit (INDEPENDENT_AMBULATORY_CARE_PROVIDER_SITE_OTHER): Payer: Medicare Other | Admitting: Psychiatry

## 2015-11-30 VITALS — BP 126/70 | HR 80 | Ht 64.5 in | Wt 270.0 lb

## 2015-11-30 DIAGNOSIS — F332 Major depressive disorder, recurrent severe without psychotic features: Secondary | ICD-10-CM | POA: Diagnosis not present

## 2015-11-30 DIAGNOSIS — G4700 Insomnia, unspecified: Secondary | ICD-10-CM

## 2015-11-30 DIAGNOSIS — F4010 Social phobia, unspecified: Secondary | ICD-10-CM | POA: Diagnosis not present

## 2015-11-30 MED ORDER — LITHIUM CARBONATE ER 300 MG PO TBCR: 300.0000 mg | EXTENDED_RELEASE_TABLET | Freq: Two times a day (BID) | ORAL | Status: DC

## 2015-11-30 MED ORDER — BUPROPION HCL 100 MG PO TABS: 200.0000 mg | ORAL_TABLET | Freq: Two times a day (BID) | ORAL | Status: DC

## 2015-11-30 MED ORDER — PAROXETINE HCL 30 MG PO TABS: 30.0000 mg | ORAL_TABLET | ORAL | Status: DC

## 2015-11-30 NOTE — Progress Notes (Signed)
Patient ID: Inna Syvertson, female   DOB: 06/08/1961, 55 y.o.   MRN: [id number] [place] Follow-up Outpatient Visit  Harman Mox 01-10-1961   Date: 08/31/2015  Chief Complaint  Patient presents with  . Follow-up    HPI Comments: Ms. Isbill is a 55  Y/O woman with a past psychiatric history Major Depressive Disorder, severe, recurrent. Mood disorder due to [facility] and Social Phobia.  The patient is referred for psychiatric services for  medication management.   Patient still endorses feeling down. Does not feel comfortable around people she gets cautious about her weight. During the time when she is by herself she does okay but worries about meeting or going out. She has had a bypass surgery in the past still her weight has fluctuated. Sleep at times reasonable but at times she worries and that keeps her awake. Other than that she says either medication she feels Lithium and paxil does help. No suicidal tougths.  No reported side effects.  Remains concern of her obesity as she has fluctuation of weight despite Gastric bypass in 2002. She avoid family says what would they say about my weight. . Severity: Depression: 6/10 (0=Very depressed; 5=Neutral; 10=Very Happy)-  Anxiety- 5/10 (0=no anxiety; 5= moderate/tolerable anxiety; 10= panic attacks)  . Duration: Since age 55, had a suicide attempt at age 55.   . Timing: Worse in the evenings   . Context: She perceives herself with low self esteem.   . Modifying factors: medications, her son  . Associated signs and symptoms: No delusions hallucinations or manic symptoms   Traumatic Brain Injury: No none   Review of Systems  Constitutional: Negative for fever.  Musculoskeletal: Positive for back pain.  Skin: Negative for rash.  Neurological: Negative for tingling and headaches.  Psychiatric/Behavioral: Positive for depression. Negative for suicidal ideas.   Filed Vitals:   11/30/15 1107  BP: 126/70  Pulse: 80   Height: 5' 4.5" (1.638 m)  Weight: 270 lb (122.471 kg)   Physical Exam  Constitutional: She appears well-developed and well-nourished. No distress.  Skin: She is not diaphoretic.  No tremors of hands noted while patient was interviewed.  Musculoskeletal: Gait & Station: normal Patient leans: N/A   Past Psychiatric History: Reviewed   Diagnosis: Major Depressive Disorder, recurrent, severe   Hospitalizations: Several since age 55   Outpatient Care: Since   Substance Abuse Care: none   Self-Mutilation: Patient denies   Suicidal Attempts: Three attempts   Violent Behaviors: Patient denies.    Past Medical History: Reviewed  Past Medical History  Diagnosis Date  . Hypertension   . Bipolar 1 disorder ([place]) 2010    ect  . Anxiety   . Asthma   . Fibroids   . Plantar fasciitis 2013  . Sciatica     RT hip  . B12 deficiency   . Vitamin D deficiency    History of Loss of Consciousness: No  Seizure History: No  Cardiac History: Yes-HTN  Allergies: No Known Allergies   Current Medications: Reviewed   Current Outpatient Prescriptions on File Prior to Visit  Medication Sig Dispense Refill  . cholecalciferol (VITAMIN D) 1000 UNITS tablet Take 5,000 Units by mouth daily.    . Cyanocobalamin (VITAMIN B-12) 5000 MCG SUBL Place 5,000 mcg under the tongue every other day.     . Flaxseed, Linseed, (FLAX SEED OIL PO) Take by mouth.    . hyoscyamine (LEVSIN, ANASPAZ) 0.125 MG tablet Take 0.125 mg by mouth as needed.    Marland Kitchen  lisinopril (PRINIVIL,ZESTRIL) 10 MG tablet take 1 tablet by mouth daily 90 tablet 1  . Multiple Vitamin (MULTIVITAMIN) tablet Take 1 tablet by mouth daily.    Marland Kitchen PROAIR HFA 108 (90 BASE) MCG/ACT inhaler inhale 2 puffs by mouth every 6 hours if needed 25.5 g 1   No current facility-administered medications on file prior to visit.      Family History: Reviewed  Family History   Problem  Relation  Age of Onset   .  Adopted: Yes   .  Aneurysm  Father      Psychiatric Specialty Exam:  Objective: Appearance: Well Groomed   Eye Contact:: Good   Speech: Clear and Coherent and Normal Rate   Volume: Normal   Mood: more better then bad days. Not hopeless  Affect: Appropriate, Congruent and Restricted   Thought Process: Goal Directed, Logical and Loose   Orientation: Full   Thought Content: WDL   Suicidal Thoughts: Patient denies   Homicidal Thoughts: Patient denies   Judgement: Fair   Insight: Fair   Psychomotor Activity: Normal   Akathisia: No   Handed: Right   Memory: Immediate 3/3, Recent: 1/3   [place] of knowledge-average to above average.  AIMS (if indicated): Not indicated at this time.   Assets: Communication Skills  Desire for Improvement    Assessment:  AXIS I: Major Depressive Disorder, severe, recurrent-- not worsen Social Phobia-slightly improving. Mood symptoms due to GMC/back problems/obesity   Treatment Plan/Recommendations:   Plan of Care:  PLAN:  1. Affirm with the patient that the medications are taken as ordered. Patient  expressed understanding of how their medications were to be used.    Laboratory:  reveiwed  Psychotherapy: Therapy: brief supportive therapy provided. Discussed psychosocial stressors. Again, encouraged patient to continue spending time daily making changes to 4 important areas of her life. More than 50% of the visit was spent on individual therapy.   Medications:  Continue the following psychiatric medications as written prior to this appointment with the following changes::  Refills sent for lithium, paxil and wellbutrin.  Advised to avoid alcohol intake,. Says she has cut down.  Medications keep some balance. Not interested in therapy and not interested to increase medication dose.   -Risks and benefits, side effects and alternatives discussed with patient, she was given an opportunity to ask questions about her medication, illness, and treatment. All current psychiatric  medications have been reviewed and discussed with the patient and adjusted as clinically appropriate. The patient has been provided an accurate and updated list of the medications being now prescribed.   Routine PRN Medications:  Negative  Consultations: The patient was encouraged to keep all PCP and specialty clinic appointments.   Safety Concerns:   Patient told to call clinic if any problems occur. Patient advised to go to  ER  if she should develop SI/HI, side effects, or if symptoms worsen. Has crisis numbers to call if needed.    Other:   8. Patient was instructed to return to clinic in 3 months.  9. The patient was advised to call and cancel their mental health appointment within 24 hours of the appointment, if they are unable to keep the appointment, as well as the three no show and termination from clinic policy. 10. The patient expressed understanding of the plan and agrees with the above. Fu in 3 months.    Time Spent: 25 minutes Merian Capron, M.D.  11/30/2015 11:40 AM

## 2015-12-21 ENCOUNTER — Encounter: Payer: Self-pay | Admitting: Family

## 2015-12-21 ENCOUNTER — Ambulatory Visit (INDEPENDENT_AMBULATORY_CARE_PROVIDER_SITE_OTHER): Payer: Medicare Other | Admitting: Family

## 2015-12-21 VITALS — BP 121/71 | HR 73 | Temp 98.2°F | Resp 14 | Ht 64.5 in | Wt 266.8 lb

## 2015-12-21 DIAGNOSIS — I10 Essential (primary) hypertension: Secondary | ICD-10-CM | POA: Diagnosis not present

## 2015-12-21 DIAGNOSIS — E559 Vitamin D deficiency, unspecified: Secondary | ICD-10-CM

## 2015-12-21 DIAGNOSIS — E042 Nontoxic multinodular goiter: Secondary | ICD-10-CM

## 2015-12-21 DIAGNOSIS — J4520 Mild intermittent asthma, uncomplicated: Secondary | ICD-10-CM

## 2015-12-21 DIAGNOSIS — E538 Deficiency of other specified B group vitamins: Secondary | ICD-10-CM | POA: Diagnosis not present

## 2015-12-21 DIAGNOSIS — F332 Major depressive disorder, recurrent severe without psychotic features: Secondary | ICD-10-CM

## 2015-12-21 LAB — VITAMIN B12: Vitamin B-12: 1406 pg/mL — ABNORMAL HIGH (ref 211–911)

## 2015-12-21 LAB — VITAMIN D 25 HYDROXY (VIT D DEFICIENCY, FRACTURES): VITD: 39.28 ng/mL (ref 30.00–100.00)

## 2015-12-21 MED ORDER — CYANOCOBALAMIN 2500 MCG SL SUBL: 1.0000 | SUBLINGUAL_TABLET | SUBLINGUAL | Status: DC

## 2015-12-21 NOTE — Assessment & Plan Note (Signed)
Last tsh normal.  Followed by Dr. Loanne Drilling.

## 2015-12-21 NOTE — Assessment & Plan Note (Signed)
Stable on lisinopril, continue same.  

## 2015-12-21 NOTE — Assessment & Plan Note (Signed)
Check vit D.  Continue supplement.

## 2015-12-21 NOTE — Progress Notes (Signed)
Pre visit review using our clinic review tool, if applicable. No additional management support is needed unless otherwise documented below in the visit note. 

## 2015-12-21 NOTE — Assessment & Plan Note (Signed)
Stable- management per psych.  

## 2015-12-21 NOTE — Patient Instructions (Signed)
Please complete lab work prior to leaving.   

## 2015-12-21 NOTE — Assessment & Plan Note (Signed)
Stable, monitor.  °

## 2015-12-21 NOTE — Progress Notes (Signed)
Subjective:    Patient ID: Julie Powell, female    DOB: April 10, 1961, 55 y.o.   MRN: [id number]  HPI  [name] is a 55 yr old female who presents today for follow up.  1) HTN- reports good compliance with lisinopril.  BP Readings from Last 3 Encounters:  11/30/15 126/70  09/22/15 110/78  08/31/15 126/78   2) Multinodular goiter- She will see endo in 2 weeks. Sees Renato Shin MD. Lab Results  Component Value Date   TSH 2.89 09/22/2015   3) B12 deficiency- currently maintained on oral b12.  She uses 3 times a week sublingual. Reports dose is 2500  4) Depression- Reports that her depression remains stable and unchanged. Currently maintained on lithium, wellbutrin and paxil. She is followed by psychiatry. Dr. Ulice Brilliant.  11/30/15 office noted is reviewed.    5) Asthma-  Reports overall controlled.    6) Vit D deficiency- on vit D 2500 3 x weekly.   Review of Systems  Respiratory: Negative for cough and shortness of breath.   Cardiovascular: Negative for leg swelling.      see HPI  Past Medical History  Diagnosis Date  . Hypertension   . Bipolar 1 disorder ([place]) 2010    ect  . Anxiety   . Asthma   . Fibroids   . Plantar fasciitis 2013  . Sciatica     RT hip  . B12 deficiency   . Vitamin D deficiency     Social History   Social History  . Marital Status: Divorced    Spouse Name: N/A  . Number of Children: N/A  . Years of Education: N/A   Occupational History  . Not on file.   Social History Main Topics  . Smoking status: Former Smoker -- 0.50 packs/day for 10 years    Types: Cigarettes    Quit date: 11/07/1991  . Smokeless tobacco: Never Used  . Alcohol Use: 7.0 oz/week    14 drink(s) per week  . Drug Use: No     Comment: Caffeine- Carbonated beverage 16 ounces.  . Sexual Activity: No   Other Topics Concern  . 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   Enjoys television    Past Surgical History  Procedure Laterality Date  . Sp cholecystomy  1986  . Cesarean section  1995  . Gastric bypass  2003  . Cystectomy  1998    "thyroglycol duct cyst in neck" had dysphagia, was told benign  . Pilonidal cyst drainage  1982  . Microdiscectomy lumbar  09/11/14    L3-L4  Dr Saintclair Halsted    Family History  Problem Relation Age of Onset  . Adopted: Yes  . Aneurysm Father   . Alcohol abuse Father   . Alcohol abuse Brother   . Alcohol abuse Brother   . Cancer Brother     prostate  . Alcohol abuse Brother   . Lung cancer Sister   . Thyroid disease Neg Hx     No Known Allergies  Current Outpatient Prescriptions on File Prior to Visit  Medication Sig Dispense Refill  . buPROPion (WELLBUTRIN) 100 MG tablet Take 2 tablets (200 mg total) by mouth 2 (two)  times daily. 360 tablet 0  . cholecalciferol (VITAMIN D) 1000 UNITS tablet Take 5,000 Units by mouth daily.    . Cyanocobalamin (VITAMIN B-12) 5000 MCG SUBL Place 5,000 mcg under the tongue every other day.     . Flaxseed, Linseed, (FLAX SEED OIL PO) Take by mouth.    Marland Kitchen lisinopril (PRINIVIL,ZESTRIL) 10 MG tablet take 1 tablet by mouth daily 90 tablet 1  . lithium carbonate (LITHOBID) 300 MG CR tablet Take 1 tablet (300 mg total) by mouth 2 (two) times daily. 180 tablet 0  . Multiple Vitamin (MULTIVITAMIN) tablet Take 1 tablet by mouth daily.    Marland Kitchen PARoxetine (PAXIL) 30 MG tablet Take 1 tablet (30 mg total) by mouth every morning. 90 tablet 0  . PROAIR HFA 108 (90 BASE) MCG/ACT inhaler inhale 2 puffs by mouth every 6 hours if needed 25.5 g 1   No current facility-administered medications on file prior to visit.    Ht 5' 4.5" (1.638 m)  LMP 09/11/2013    Objective:   Physical Exam  Constitutional: She is oriented to person, place, and  time. She appears well-developed and well-nourished.  HENT:  Head: Normocephalic and atraumatic.  Cardiovascular: Normal rate, regular rhythm and normal heart sounds.   No murmur heard. Pulmonary/Chest: Effort normal and breath sounds normal. No respiratory distress. She has no wheezes.  Musculoskeletal: She exhibits no edema.  Lymphadenopathy:    She has no cervical adenopathy.  Neurological: She is alert and oriented to person, place, and time.  Psychiatric: She has a normal mood and affect. Her behavior is normal. Judgment and thought content normal.          Assessment & Plan:

## 2015-12-31 ENCOUNTER — Ambulatory Visit: Payer: Self-pay | Admitting: Endocrinology

## 2016-01-17 NOTE — Progress Notes (Signed)
Subjective:    Patient ID: Julie Powell, female    DOB: [birth year], 55 y.o.   MRN: [id number]  HPI Pt returns for f/u of a small multinodular goiter (dx'ed 2015; [place] in 2015 did not meet bx criteria; she had resection of a thyroglossal duct cyst in 1998).  She does not notice the goiter.   Morbid obesity: she had gastric bypass surgery in 2003.  She has not recently taken B-12 injections--SL tabs only.  Denies weight change.   Vit-D deficiency: denies cramps.  A few mos ago, she decreased the vit-D to 2000 units/d.   Past Medical History  Diagnosis Date  . Hypertension   . Bipolar 1 disorder ([place]) 2010    ect  . Anxiety   . Asthma   . Fibroids   . Plantar fasciitis 2013  . Sciatica     RT hip  . B12 deficiency   . Vitamin D deficiency     Past Surgical History  Procedure Laterality Date  . Sp cholecystomy  1986  . Cesarean section  1995  . Gastric bypass  2003  . Cystectomy  1998    "thyroglycol duct cyst in neck" had dysphagia, was told benign  . Pilonidal cyst drainage  1982  . Microdiscectomy lumbar  09/11/14    L3-L4  Dr Saintclair Halsted    Social History   Social History  . Marital Status: Divorced    Spouse Name: N/A  . Number of Children: N/A  . Years of Education: N/A   Occupational History  . Not on file.   Social History Main Topics  . Smoking status: Former Smoker -- 0.50 packs/day for 10 years    Types: Cigarettes    Quit date: 11/07/1991  . Smokeless tobacco: Never Used  . Alcohol Use: 7.0 oz/week    14 drink(s) per week  . Drug Use: No     Comment: Caffeine- Carbonated beverage 16 ounces.  . Sexual Activity: No   Other Topics Concern  . 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   Enjoys  television    Current Outpatient Prescriptions on File Prior to Visit  Medication Sig Dispense Refill  . buPROPion (WELLBUTRIN) 100 MG tablet Take 2 tablets (200 mg total) by mouth 2 (two) times daily. 360 tablet 0  . cholecalciferol (VITAMIN D) 1000 UNITS tablet Take 5,000 Units by mouth daily.    . Cyanocobalamin 2500 MCG SUBL Place 1 tablet (2,500 mcg total) under the tongue 3 (three) times a week.  0  . Flaxseed, Linseed, (FLAX SEED OIL PO) Take by mouth.    . hyoscyamine (LEVBID) 0.375 MG 12 hr tablet Take 0.375 mg by mouth daily.  0  . lisinopril (PRINIVIL,ZESTRIL) 10 MG tablet take 1 tablet by mouth daily 90 tablet 1  . lithium carbonate (LITHOBID) 300 MG CR tablet Take 1 tablet (300 mg total) by mouth 2 (two) times daily. 180 tablet 0  . Multiple Vitamin (MULTIVITAMIN) tablet Take 1 tablet by mouth daily.    Marland Kitchen PARoxetine (PAXIL) 30 MG tablet Take 1 tablet (30 mg total) by mouth every morning. 90 tablet 0  . PROAIR HFA 108 (90 BASE) MCG/ACT inhaler inhale 2 puffs by  mouth every 6 hours if needed 25.5 g 1   No current facility-administered medications on file prior to visit.    No Known Allergies  Family History  Problem Relation Age of Onset  . Adopted: Yes  . Aneurysm Father   . Alcohol abuse Father   . Alcohol abuse Brother   . Alcohol abuse Brother   . Cancer Brother     prostate  . Alcohol abuse Brother   . Lung cancer Sister   . Thyroid disease Neg Hx     BP 120/76 mmHg  Pulse 77  Temp(Src) 98.2 F (36.8 C) (Oral)  Resp 20  Ht 5' 4.5" (1.638 m)  Wt 266 lb (120.657 kg)  BMI 44.97 kg/m2  SpO2 94%  LMP 09/11/2013  Review of Systems Denies dysphagia and sob    Objective:   Physical Exam VITAL SIGNS:  See vs page GENERAL: no distress Neck: a healed scar is present.  i do not appreciate a nodule in the thyroid or elsewhere in the neck, but the right thyroid lobe is enlarged.     25-OH-vit-D=32    Assessment & Plan:  Vit-D deficiency,  well-replaced Goiter: due for recheck  Patient is advised the following: Patient Instructions  Let's recheck the ultrasound.  you will receive a phone call, about a day and time for an appointment.  most of the time, a "lumpy thyroid" will eventually become overactive.  this is usually a slow process, happening over the span of many years.  Please continue the same vitamin-D. Please come back for a follow-up appointment in 2 years.

## 2016-01-17 NOTE — Patient Instructions (Addendum)
Let's recheck the ultrasound.  you will receive a phone call, about a day and time for an appointment.  most of the time, a "lumpy thyroid" will eventually become overactive.  this is usually a slow process, happening over the span of many years.  Please continue the same vitamin-D. Please come back for a follow-up appointment in 2 years.

## 2016-01-18 ENCOUNTER — Encounter: Payer: Self-pay | Admitting: Endocrinology

## 2016-01-18 ENCOUNTER — Ambulatory Visit (INDEPENDENT_AMBULATORY_CARE_PROVIDER_SITE_OTHER): Payer: Medicare Other | Admitting: Endocrinology

## 2016-01-18 VITALS — BP 120/76 | HR 77 | Temp 98.2°F | Resp 20 | Ht 64.5 in | Wt 266.0 lb

## 2016-01-18 DIAGNOSIS — E042 Nontoxic multinodular goiter: Secondary | ICD-10-CM

## 2016-01-18 NOTE — Progress Notes (Signed)
Pre visit review using our clinic review tool, if applicable. No additional management support is needed unless otherwise documented below in the visit note. 

## 2016-02-01 ENCOUNTER — Other Ambulatory Visit: Payer: Self-pay | Admitting: Family

## 2016-02-01 ENCOUNTER — Ambulatory Visit (HOSPITAL_BASED_OUTPATIENT_CLINIC_OR_DEPARTMENT_OTHER)
Admission: RE | Admit: 2016-02-01 | Discharge: 2016-02-01 | Disposition: A | Discharge status: Home / Self Care | Payer: Medicare Other | Source: Ambulatory Visit | Attending: Family | Admitting: Family

## 2016-02-01 ENCOUNTER — Ambulatory Visit (HOSPITAL_BASED_OUTPATIENT_CLINIC_OR_DEPARTMENT_OTHER)
Admission: RE | Admit: 2016-02-01 | Discharge: 2016-02-01 | Disposition: A | Discharge status: Home / Self Care | Payer: Medicare Other | Source: Ambulatory Visit | Attending: Endocrinology | Admitting: Endocrinology

## 2016-02-01 DIAGNOSIS — E042 Nontoxic multinodular goiter: Secondary | ICD-10-CM | POA: Diagnosis not present

## 2016-02-01 DIAGNOSIS — Z1231 Encounter for screening mammogram for malignant neoplasm of breast: Secondary | ICD-10-CM | POA: Insufficient documentation

## 2016-02-23 ENCOUNTER — Other Ambulatory Visit: Payer: Self-pay | Admitting: Family

## 2016-02-29 ENCOUNTER — Ambulatory Visit (INDEPENDENT_AMBULATORY_CARE_PROVIDER_SITE_OTHER): Payer: Medicare Other | Admitting: Psychiatry

## 2016-02-29 ENCOUNTER — Encounter (HOSPITAL_COMMUNITY): Payer: Self-pay | Admitting: Psychiatry

## 2016-02-29 DIAGNOSIS — G4700 Insomnia, unspecified: Secondary | ICD-10-CM

## 2016-02-29 DIAGNOSIS — F4010 Social phobia, unspecified: Secondary | ICD-10-CM

## 2016-02-29 DIAGNOSIS — F332 Major depressive disorder, recurrent severe without psychotic features: Secondary | ICD-10-CM | POA: Diagnosis not present

## 2016-02-29 MED ORDER — PAROXETINE HCL 30 MG PO TABS: 30.0000 mg | ORAL_TABLET | ORAL | Status: DC

## 2016-02-29 MED ORDER — BUPROPION HCL 100 MG PO TABS: 200.0000 mg | ORAL_TABLET | Freq: Two times a day (BID) | ORAL | Status: DC

## 2016-02-29 MED ORDER — LITHIUM CARBONATE ER 300 MG PO TBCR: 300.0000 mg | EXTENDED_RELEASE_TABLET | Freq: Two times a day (BID) | ORAL | Status: DC

## 2016-02-29 NOTE — Progress Notes (Signed)
Patient ID: Julie Powell, female   DOB: [birth year], 55 y.o.   MRN: [id number] [place] Follow-up Outpatient Visit  Skylene Ottmar 04-08-1961   Date: 02/29/2016  Chief Complaint  Patient presents with  . Follow-up    HPI Comments: Ms. Plate is a 55  Y/O woman with a past psychiatric history Major Depressive Disorder, severe, recurrent. Mood disorder due to [facility] and Social Phobia.  The patient is referred for psychiatric services for  medication management.   Feels low about her self as she stays at home and gets anxious with people. She does believe meds are doing what they can and earlier lithium addition has helped her mood. She has had a bypass surgery in the past still her weight has fluctuated. Sleep at times reasonable but at times she worries and that keeps her awake.  No suicidal tougths.  No reported side effects.  Remains concern of her obesity as she has fluctuation of weight despite Gastric bypass in 2002. She avoid family says what would they say about my weight. . Severity: Depression: 6/10 (0=Very depressed; 5=Neutral; 10=Very Happy)-  Anxiety- 5/10 (0=no anxiety; 5= moderate/tolerable anxiety; 10= panic attacks)  . Duration: Since age 67, had a suicide attempt at age 68.   . Timing: Worse in the evenings   . Context: She perceives herself with low self esteem.   . Modifying factors: medications, her son  . Associated signs and symptoms: No delusions hallucinations or manic symptoms   Traumatic Brain Injury: No none   Review of Systems  Constitutional: Negative for fever.  Cardiovascular: Negative for chest pain.  Musculoskeletal: Positive for back pain.  Skin: Negative for itching.  Neurological: Negative for tingling and headaches.  Psychiatric/Behavioral: Positive for depression. Negative for suicidal ideas.   There were no vitals filed for this visit. Physical Exam  Constitutional: She appears well-developed and well-nourished. No  distress.  Skin: She is not diaphoretic.  No tremors of hands noted while patient was interviewed.  Musculoskeletal: Gait & Station: normal Patient leans: N/A   Past Psychiatric History: Reviewed   Diagnosis: Major Depressive Disorder, recurrent, severe   Hospitalizations: Several since age 49   Outpatient Care: Since   Substance Abuse Care: none   Self-Mutilation: Patient denies   Suicidal Attempts: Three attempts   Violent Behaviors: Patient denies.    Past Medical History: Reviewed  Past Medical History  Diagnosis Date  . Hypertension   . Bipolar 1 disorder ([place]) 2010    ect  . Anxiety   . Asthma   . Fibroids   . Plantar fasciitis 2013  . Sciatica     RT hip  . B12 deficiency   . Vitamin D deficiency    History of Loss of Consciousness: No  Seizure History: No  Cardiac History: Yes-HTN  Allergies: No Known Allergies   Current Medications: Reviewed   Current Outpatient Prescriptions on File Prior to Visit  Medication Sig Dispense Refill  . cholecalciferol (VITAMIN D) 1000 UNITS tablet Take 5,000 Units by mouth daily.    . Cyanocobalamin 2500 MCG SUBL Place 1 tablet (2,500 mcg total) under the tongue 3 (three) times a week.  0  . Flaxseed, Linseed, (FLAX SEED OIL PO) Take by mouth.    . hyoscyamine (LEVBID) 0.375 MG 12 hr tablet Take 0.375 mg by mouth daily.  0  . lisinopril (PRINIVIL,ZESTRIL) 10 MG tablet take 1 tablet by mouth once daily 90 tablet 0  . Multiple Vitamin (MULTIVITAMIN) tablet Take 1 tablet  by mouth daily.    Marland Kitchen PROAIR HFA 108 (90 BASE) MCG/ACT inhaler inhale 2 puffs by mouth every 6 hours if needed 25.5 g 1   No current facility-administered medications on file prior to visit.      Family History: Reviewed  Family History   Problem  Relation  Age of Onset   .  Adopted: Yes   .  Aneurysm  Father     Psychiatric Specialty Exam:  Objective: Appearance: Well Groomed   Eye Contact:: Good   Speech: Clear and Coherent and Normal Rate    Volume: Normal   Mood: somewhat dysphoric but not hopeless  Affect: Appropriate, Congruent and Restricted   Thought Process: Goal Directed, Logical and Loose   Orientation: Full   Thought Content: WDL   Suicidal Thoughts: Patient denies   Homicidal Thoughts: Patient denies   Judgement: Fair   Insight: Fair   Psychomotor Activity: Normal   Akathisia: No   Handed: Right   Memory: Immediate 3/3, Recent: 1/3   [place] of knowledge-average to above average.  AIMS (if indicated): Not indicated at this time.   Assets: Communication Skills  Desire for Improvement    Assessment:  AXIS I: Major Depressive Disorder, severe, recurrent-- not worsen Social Phobia-slightly improving. Mood symptoms due to GMC/back problems/obesity   Treatment Plan/Recommendations:   Plan of Care:  PLAN:  1. Affirm with the patient that the medications are taken as ordered. Patient  expressed understanding of how their medications were to be used.    Laboratory:  reveiwed  Psychotherapy: Therapy: brief supportive therapy provided. Discussed psychosocial stressors. Again, encouraged patient to continue spending time daily making changes to 4 important areas of her life. More than 50% of the visit was spent on individual therapy.   Medications:  Continue the following psychiatric medications as written prior to this appointment with the following changes::  Refills sent for lithium, paxil and wellbutrin. Does not want to increase doses. Advised to avoid alcohol intake,. Says she has cut down.  Medications keep some balance. Understands today that therapy may help low self esteem and will refer.  -Risks and benefits, side effects and alternatives discussed with patient, she was given an opportunity to ask questions about her medication, illness, and treatment. All current psychiatric medications have been reviewed and discussed with the patient and adjusted as clinically appropriate. The patient  has been provided an accurate and updated list of the medications being now prescribed.     Follow up in 3 months or earlier if needed.      Time Spent: 25 minutes Merian Capron, M.D.  02/29/2016 10:46 AM

## 2016-03-20 ENCOUNTER — Telehealth: Payer: Self-pay | Admitting: *Deleted

## 2016-03-20 NOTE — Telephone Encounter (Signed)
Unable to reach patient at time of pre-visit call. Unable to leave voicemail.

## 2016-03-21 ENCOUNTER — Encounter: Payer: Self-pay | Admitting: Family

## 2016-03-21 ENCOUNTER — Ambulatory Visit (INDEPENDENT_AMBULATORY_CARE_PROVIDER_SITE_OTHER): Payer: Medicare Other | Admitting: Family

## 2016-03-21 DIAGNOSIS — E669 Obesity, unspecified: Secondary | ICD-10-CM | POA: Diagnosis not present

## 2016-03-21 DIAGNOSIS — F332 Major depressive disorder, recurrent severe without psychotic features: Secondary | ICD-10-CM | POA: Diagnosis not present

## 2016-03-21 DIAGNOSIS — E785 Hyperlipidemia, unspecified: Secondary | ICD-10-CM | POA: Diagnosis not present

## 2016-03-21 DIAGNOSIS — I10 Essential (primary) hypertension: Secondary | ICD-10-CM

## 2016-03-21 DIAGNOSIS — F1010 Alcohol abuse, uncomplicated: Secondary | ICD-10-CM

## 2016-03-21 DIAGNOSIS — Z0000 Encounter for general adult medical examination without abnormal findings: Secondary | ICD-10-CM

## 2016-03-21 LAB — BASIC METABOLIC PANEL
BUN: 9 mg/dL (ref 6–23)
CO2: 28 mEq/L (ref 19–32)
Calcium: 9.6 mg/dL (ref 8.4–10.5)
Chloride: 103 mEq/L (ref 96–112)
Creatinine, Ser: 0.72 mg/dL (ref 0.40–1.20)
GFR: 109.15 mL/min (ref >60.00)
Glucose, Bld: 79 mg/dL (ref 70–99)
Potassium: 3.9 mEq/L (ref 3.5–5.1)
Sodium: 137 mEq/L (ref 135–145)

## 2016-03-21 LAB — LIPID PANEL
Cholesterol: 243 mg/dL — ABNORMAL HIGH (ref 0–200)
HDL: 135 mg/dL (ref >39.00)
LDL Cholesterol: 94 mg/dL (ref 0–99)
NonHDL: 108.48
Total CHOL/HDL Ratio: 2
Triglycerides: 73 mg/dL (ref 0.0–149.0)
VLDL: 14.6 mg/dL (ref 0.0–40.0)

## 2016-03-21 LAB — HEPATIC FUNCTION PANEL
ALT: 18 U/L (ref 0–35)
AST: 22 U/L (ref 0–37)
Albumin: 4.1 g/dL (ref 3.5–5.2)
Alkaline Phosphatase: 125 U/L — ABNORMAL HIGH (ref 39–117)
Bilirubin, Direct: 0.1 mg/dL (ref 0.0–0.3)
Total Bilirubin: 0.5 mg/dL (ref 0.2–1.2)
Total Protein: 6.9 g/dL (ref 6.0–8.3)

## 2016-03-21 NOTE — Progress Notes (Addendum)
Subjective:    Patient ID: Julie Powell, female    DOB: 12/13/1960, 55 y.o.   MRN: [id number]  HPI   Patient presents today for complete physical and follow up.   Immunizations:  Up to date Diet: healthy Exercise: no Colonoscopy: 12/15- due 202 Pap Smear: 1/14- normal.  Mammogram: 4/17 Dental: up to date Vision: due- will schedule  Wt Readings from Last 3 Encounters:  03/21/16 264 lb (119.75 kg)  01/18/16 266 lb (120.657 kg)  12/21/15 266 lb 12.8 oz (121.02 kg)   Depression-   Obesity-  Not exercising.     Review of Systems  Constitutional: Negative for unexpected weight change.  HENT: Negative for rhinorrhea.   Eyes: Negative for visual disturbance.  Respiratory: Negative for cough.   Cardiovascular: Negative for leg swelling.  Gastrointestinal: Negative for constipation.       Occasional diarrhea  Genitourinary: Negative for dysuria and frequency.  Musculoskeletal: Positive for back pain. Negative for myalgias.       Some left knee pain  Skin: Negative for rash.  Neurological: Negative for headaches.  Hematological: Negative for adenopathy.  Psychiatric/Behavioral:       Mood and anxiety are poor- continues with psychiatry   Past Medical History  Diagnosis Date  . Hypertension   . Bipolar 1 disorder ([place]) 2010    ect  . Anxiety   . Asthma   . Fibroids   . Plantar fasciitis 2013  . Sciatica     RT hip  . B12 deficiency   . Vitamin D deficiency      Social History   Social History  . Marital Status: Divorced    Spouse Name: N/A  . Number of Children: N/A  . Years of Education: N/A   Occupational History  . Not on file.   Social History Main Topics  . Smoking status: Former Smoker -- 0.50 packs/day for 10 years    Types: Cigarettes    Quit date: 11/07/1991  . Smokeless tobacco: Never Used  . Alcohol Use: 0.0 oz/week    0 Standard drinks or equivalent per week     Comment: Pt states she drinks half of a fifth of liquor per day  . Drug  Use: No     Comment: Caffeine- Carbonated beverage 16 ounces.  . Sexual Activity: No   Other Topics Concern  . 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   Enjoys television    Past Surgical History  Procedure Laterality Date  . Sp cholecystomy  1986  . Cesarean section  1995  . Gastric bypass  2003  . Cystectomy  1998    "thyroglycol duct cyst in neck" had dysphagia, was told benign  . Pilonidal cyst drainage  1982  . Microdiscectomy lumbar  09/11/14    L3-L4  Dr Saintclair Halsted    Family History  Problem Relation Age of Onset  . Adopted: Yes  . Aneurysm Father   . Alcohol abuse Father   . Alcohol abuse Brother   . Alcohol abuse Brother   . Cancer Brother     prostate  . Alcohol abuse Brother   . Lung cancer Sister   . Thyroid disease Neg Hx  No Known Allergies  Current Outpatient Prescriptions on File Prior to Visit  Medication Sig Dispense Refill  . buPROPion (WELLBUTRIN) 100 MG tablet Take 2 tablets (200 mg total) by mouth 2 (two) times daily. 360 tablet 0  . cholecalciferol (VITAMIN D) 1000 UNITS tablet Take 5,000 Units by mouth daily.    . Cyanocobalamin 2500 MCG SUBL Place 1 tablet (2,500 mcg total) under the tongue 3 (three) times a week.  0  . Flaxseed, Linseed, (FLAX SEED OIL PO) Take by mouth.    . hyoscyamine (LEVBID) 0.375 MG 12 hr tablet Take 0.375 mg by mouth daily.  0  . lisinopril (PRINIVIL,ZESTRIL) 10 MG tablet take 1 tablet by mouth once daily 90 tablet 0  . lithium carbonate (LITHOBID) 300 MG CR tablet Take 1 tablet (300 mg total) by mouth 2 (two) times daily. 180 tablet 0  . Multiple Vitamin (MULTIVITAMIN) tablet Take 1 tablet by mouth daily.    Marland Kitchen PARoxetine (PAXIL) 30 MG tablet Take 1 tablet (30 mg  total) by mouth every morning. 90 tablet 0  . PROAIR HFA 108 (90 BASE) MCG/ACT inhaler inhale 2 puffs by mouth every 6 hours if needed 25.5 g 1   No current facility-administered medications on file prior to visit.    BP 119/81 mmHg  Pulse 78  Temp(Src) 98.4 F (36.9 C) (Oral)  Ht 5\' 4"  (1.626 m)  Wt 264 lb (119.75 kg)  BMI 45.29 kg/m2  SpO2 99%  LMP 09/11/2013       Objective:   Physical Exam   Physical Exam  Constitutional: She is oriented to person, place, and time. She appears well-developed and well-nourished. No distress.  HENT:  Head: Normocephalic and atraumatic.  Right Ear: Tympanic membrane and ear canal normal.  Left Ear: Tympanic membrane and ear canal normal.  Mouth/Throat: Oropharynx is clear and moist.  Eyes: Pupils are equal, round, and reactive to light. No scleral icterus.  Neck: Normal range of motion. No thyromegaly present.  Cardiovascular: Normal rate and regular rhythm.   No murmur heard. Pulmonary/Chest: Effort normal and breath sounds normal. No respiratory distress. He has no wheezes. She has no rales. She exhibits no tenderness.  Abdominal: Soft. Bowel sounds are normal. He exhibits no distension and no mass. There is no tenderness. There is no rebound and no guarding.  Musculoskeletal: She exhibits no edema.  Lymphadenopathy:    She has no cervical adenopathy.  Neurological: She is alert and oriented to person, place, and time. She exhibits normal muscle tone. Coordination normal.  Skin: Skin is warm and dry.  Psychiatric: She has a flat affect. Her behavior is normal. Judgment and thought content normal.  Breasts: Examined lying Right: Without masses, retractions, discharge or axillary adenopathy.  Left: Without masses, retractions, discharge or axillary adenopathy.  Pelvis:  Bimanual exam is normal.  Difficulty visualizing cervix.         Assessment & Plan:        Assessment & Plan:

## 2016-03-21 NOTE — Patient Instructions (Signed)
Please complete lab work prior to leaving. Please work on adding regular exercise, weight loss, cutting back on alcohol. Follow up with psychiatry to discuss your ongoing depression concerns.

## 2016-03-21 NOTE — Progress Notes (Signed)
Pre visit review using our clinic review tool, if applicable. No additional management support is needed unless otherwise documented below in the visit note. 

## 2016-03-22 ENCOUNTER — Telehealth: Payer: Self-pay | Admitting: Family

## 2016-03-22 ENCOUNTER — Other Ambulatory Visit (INDEPENDENT_AMBULATORY_CARE_PROVIDER_SITE_OTHER): Payer: Medicare Other

## 2016-03-22 DIAGNOSIS — E559 Vitamin D deficiency, unspecified: Secondary | ICD-10-CM | POA: Diagnosis not present

## 2016-03-22 DIAGNOSIS — Z0000 Encounter for general adult medical examination without abnormal findings: Secondary | ICD-10-CM

## 2016-03-22 NOTE — Telephone Encounter (Signed)
Spoke with the lab, test could not be added for Dx we provided of z00.00.  Re-ordered test with dx vitamin D deficiency and test went through.

## 2016-03-22 NOTE — Telephone Encounter (Signed)
° °  Can be reached: [phone]   Reason for call: [facility] lab needs a Vit D added to patients labs. Blood has already been collected.

## 2016-03-23 LAB — VITAMIN D 25 HYDROXY (VIT D DEFICIENCY, FRACTURES): VITD: 32.21 ng/mL (ref 30.00–100.00)

## 2016-03-24 NOTE — Assessment & Plan Note (Addendum)
Recommended pt to schedule routine eye exam.  Will refer to GYN for Pap.   Discussed diet, exercise weigt loss and cutting back on alcohol consumption (she reports drinking 1/2 of a fifth every night).  Obtain routine labs.

## 2016-03-24 NOTE — Assessment & Plan Note (Signed)
Mood still seems poor. Advised pt to follow up with psychiatry.

## 2016-03-27 NOTE — Addendum Note (Signed)
Addended by: [name] on: 03/27/2016 12:43 PM   Modules accepted: Level of Service, SmartSet

## 2016-03-27 NOTE — Assessment & Plan Note (Signed)
Discussed diet/exercise/weight loss.

## 2016-04-13 ENCOUNTER — Encounter: Payer: Self-pay | Admitting: Family Medicine

## 2016-04-26 ENCOUNTER — Ambulatory Visit (INDEPENDENT_AMBULATORY_CARE_PROVIDER_SITE_OTHER): Payer: Medicare Other | Admitting: Obstetrics & Gynecology

## 2016-04-26 ENCOUNTER — Encounter: Payer: Self-pay | Admitting: Obstetrics & Gynecology

## 2016-04-26 ENCOUNTER — Other Ambulatory Visit (HOSPITAL_COMMUNITY)
Admission: RE | Admit: 2016-04-26 | Discharge: 2016-04-26 | Disposition: A | Discharge status: Home / Self Care | Payer: Medicare Other | Source: Ambulatory Visit | Attending: Obstetrics & Gynecology | Admitting: Obstetrics & Gynecology

## 2016-04-26 VITALS — Ht 64.0 in | Wt 260.0 lb

## 2016-04-26 DIAGNOSIS — Z01419 Encounter for gynecological examination (general) (routine) without abnormal findings: Secondary | ICD-10-CM | POA: Diagnosis not present

## 2016-04-26 DIAGNOSIS — Z124 Encounter for screening for malignant neoplasm of cervix: Secondary | ICD-10-CM | POA: Diagnosis not present

## 2016-04-26 DIAGNOSIS — Z1151 Encounter for screening for human papillomavirus (HPV): Secondary | ICD-10-CM | POA: Insufficient documentation

## 2016-04-26 NOTE — Progress Notes (Signed)
GYNECOLOGY CLINIC ANNUAL PREVENTATIVE CARE ENCOUNTER NOTE  Subjective:   Julie Powell is a 55 y.o. G50P1001 female here for a routine annual gynecologic exam.  Current complaints: PCP was unable to do her pap this years due to her tilted uterus.   Denies abnormal vaginal bleeding, discharge, pelvic pain, or other gynecologic concerns. Not sexually active.   Gynecologic History Patient's last menstrual period was 09/11/2013. Contraception: post menopausal status Last Pap: 2014. Results were: normal Last mammogram: 02/02/16. Results were: normal  Obstetric History OB History  Gravida Para Term Preterm AB SAB TAB Ectopic Multiple Living  1 1 1       1     # Outcome Date GA Lbr Len/2nd Weight Sex Delivery Anes PTL Lv  1 Term      CS-Unspec         Past Medical History  Diagnosis Date  . Hypertension   . Bipolar 1 disorder ([place]) 2010    ect  . Anxiety   . Asthma   . Fibroids   . Plantar fasciitis 2013  . Sciatica     RT hip  . B12 deficiency   . Vitamin D deficiency     Past Surgical History  Procedure Laterality Date  . Sp cholecystomy  1986  . Cesarean section  1995  . Gastric bypass  2003  . Cystectomy  1998    "thyroglycol duct cyst in neck" had dysphagia, was told benign  . Pilonidal cyst drainage  1982  . Microdiscectomy lumbar  09/11/14    L3-L4  Dr Saintclair Halsted    Current Outpatient Prescriptions on File Prior to Visit  Medication Sig Dispense Refill  . buPROPion (WELLBUTRIN) 100 MG tablet Take 2 tablets (200 mg total) by mouth 2 (two) times daily. 360 tablet 0  . cholecalciferol (VITAMIN D) 1000 UNITS tablet Take 5,000 Units by mouth daily.    . Cyanocobalamin 2500 MCG SUBL Place 1 tablet (2,500 mcg total) under the tongue 3 (three) times a week.  0  . Flaxseed, Linseed, (FLAX SEED OIL PO) Take by mouth.    . hyoscyamine (LEVBID) 0.375 MG 12 hr tablet Take 0.375 mg by mouth daily.  0  . lisinopril (PRINIVIL,ZESTRIL) 10 MG tablet take 1 tablet by mouth once daily  90 tablet 0  . lithium carbonate (LITHOBID) 300 MG CR tablet Take 1 tablet (300 mg total) by mouth 2 (two) times daily. 180 tablet 0  . Multiple Vitamin (MULTIVITAMIN) tablet Take 1 tablet by mouth daily.    Marland Kitchen PARoxetine (PAXIL) 30 MG tablet Take 1 tablet (30 mg total) by mouth every morning. 90 tablet 0  . PROAIR HFA 108 (90 BASE) MCG/ACT inhaler inhale 2 puffs by mouth every 6 hours if needed 25.5 g 1   No current facility-administered medications on file prior to visit.    No Known Allergies  Social History   Social History  . Marital Status: Divorced    Spouse Name: N/A  . Number of Children: N/A  . Years of Education: N/A   Occupational History  . Not on file.   Social History Main Topics  . Smoking status: Former Smoker -- 0.50 packs/day for 10 years    Types: Cigarettes    Quit date: 11/07/1991  . Smokeless tobacco: Never Used  . Alcohol Use: 0.0 oz/week    0 Standard drinks or equivalent per week     Comment: Pt states she drinks half of a fifth of liquor per day  .  Drug Use: No     Comment: Caffeine- Carbonated beverage 16 ounces.  . Sexual Activity: No   Other Topics Concern  . 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   Enjoys television    Family History  Problem Relation Age of Onset  . Adopted: Yes  . Aneurysm Father   . Alcohol abuse Father   . Alcohol abuse Brother   . Alcohol abuse Brother   . Cancer Brother     prostate  . Alcohol abuse Brother   . Lung cancer Sister   . Thyroid disease Neg Hx     The following portions of the patient's history were reviewed and updated as appropriate: allergies, current medications, past family history, past medical history, past social history, past  surgical history and problem list.  Review of Systems Pertinent items noted in HPI and remainder of comprehensive ROS otherwise negative.   Objective:  BP   Ht 5\' 4"  (1.626 m)  Wt 260 lb (117.935 kg)  BMI 44.61 kg/m2  LMP 09/11/2013 CONSTITUTIONAL: Well-developed, well-nourished female in no acute distress.  HENT:  Normocephalic, atraumatic, External right and left ear normal. Oropharynx is clear and moist EYES: Conjunctivae and EOM are normal. Pupils are equal, round, and reactive to light. No scleral icterus.  NECK: Normal range of motion, supple, no masses.  Normal thyroid.  SKIN: Skin is warm and dry. No rash noted. Not diaphoretic. No erythema. No pallor. NEUROLOGIC: Alert and oriented to person, place, and time. Normal reflexes, muscle tone coordination. No cranial nerve deficit noted. PSYCHIATRIC: Normal mood and affect. Normal behavior. Normal judgment and thought content. CARDIOVASCULAR: Normal heart rate noted, regular rhythm RESPIRATORY: Clear to auscultation bilaterally. Effort and breath sounds normal, no problems with respiration noted. BREASTS: Symmetric in size. No masses, skin changes, nipple drainage, or lymphadenopathy. Well-healed surgical scars from reduction surgery. ABDOMEN: Soft, obese, normal bowel sounds, no distention noted.  No tenderness, rebound or guarding. Well-healed surgical scars. PELVIC: Normal appearing external genitalia; normal appearing vaginal mucosa and cervix.  No abnormal discharge noted.  Pap smear obtained with some difficulty given that cervix was very small and very anterior, also deviated to the patient's right. Used long Pederson speculum.  Cervical stenosis present, unable to get adequate endocervical sample.  Enlarged uterine size due to fibroids, no other palpable masses, no uterine or adnexal tenderness. MUSCULOSKELETAL: Normal range of motion. No tenderness.  No cyanosis, clubbing, or edema.  2+ distal pulses.   Assessment:  Annual  gynecologic examination with pap smear   Plan:  Will follow up results of pap smear and manage accordingly. Mammogram is up to date; other issues managed by her PCP and other specialists  Routine preventative health maintenance measures emphasized. Please refer to After Visit Summary for other counseling recommendations.    Verita Schneiders, MD, [place] Attending Obstetrician & Gynecologist, [place] for [facility]

## 2016-04-26 NOTE — Patient Instructions (Signed)
Preventive Care for Adults, Female A healthy lifestyle and preventive care can promote health and wellness. Preventive health guidelines for women include the following key practices.  A routine yearly physical is a good way to check with your health care provider about your health and preventive screening. It is a chance to share any concerns and updates on your health and to receive a thorough exam.  Visit your dentist for a routine exam and preventive care every 6 months. Brush your teeth twice a day and floss once a day. Good oral hygiene prevents tooth decay and gum disease.  The frequency of eye exams is based on your age, health, family medical history, use of contact lenses, and other factors. Follow your health care provider's recommendations for frequency of eye exams.  Eat a healthy diet. Foods like vegetables, fruits, whole grains, low-fat dairy products, and lean protein foods contain the nutrients you need without too many calories. Decrease your intake of foods high in solid fats, added sugars, and salt. Eat the right amount of calories for you.Get information about a proper diet from your health care provider, if necessary.  Regular physical exercise is one of the most important things you can do for your health. Most adults should get at least 150 minutes of moderate-intensity exercise (any activity that increases your heart rate and causes you to sweat) each week. In addition, most adults need muscle-strengthening exercises on 2 or more days a week.  Maintain a healthy weight. The body mass index (BMI) is a screening tool to identify possible weight problems. It provides an estimate of body fat based on height and weight. Your health care provider can find your BMI and can help you achieve or maintain a healthy weight.For adults 20 years and older:  A BMI below 18.5 is considered underweight.  A BMI of 18.5 to 24.9 is normal.  A BMI of 25 to 29.9 is considered overweight.  A  BMI of 30 and above is considered obese.  Maintain normal blood lipids and cholesterol levels by exercising and minimizing your intake of saturated fat. Eat a balanced diet with plenty of fruit and vegetables. Blood tests for lipids and cholesterol should begin at age 45 and be repeated every 5 years. If your lipid or cholesterol levels are high, you are over 50, or you are at high risk for heart disease, you may need your cholesterol levels checked more frequently.Ongoing high lipid and cholesterol levels should be treated with medicines if diet and exercise are not working.  If you smoke, find out from your health care provider how to quit. If you do not use tobacco, do not start.  Lung cancer screening is recommended for adults aged 45-80 years who are at high risk for developing lung cancer because of a history of smoking. A yearly low-dose CT scan of the lungs is recommended for people who have at least a 30-pack-year history of smoking and are a current smoker or have quit within the past 15 years. A pack year of smoking is smoking an average of 1 pack of cigarettes a day for 1 year (for example: 1 pack a day for 30 years or 2 packs a day for 15 years). Yearly screening should continue until the smoker has stopped smoking for at least 15 years. Yearly screening should be stopped for people who develop a health problem that would prevent them from having lung cancer treatment.  If you are pregnant, do not drink alcohol. If you are  breastfeeding, be very cautious about drinking alcohol. If you are not pregnant and choose to drink alcohol, do not have more than 1 drink per day. One drink is considered to be 12 ounces (355 mL) of beer, 5 ounces (148 mL) of wine, or 1.5 ounces (44 mL) of liquor.  Avoid use of street drugs. Do not share needles with anyone. Ask for help if you need support or instructions about stopping the use of drugs.  High blood pressure causes heart disease and increases the risk  of stroke. Your blood pressure should be checked at least every 1 to 2 years. Ongoing high blood pressure should be treated with medicines if weight loss and exercise do not work.  If you are 55-79 years old, ask your health care provider if you should take aspirin to prevent strokes.  Diabetes screening is done by taking a blood sample to check your blood glucose level after you have not eaten for a certain period of time (fasting). If you are not overweight and you do not have risk factors for diabetes, you should be screened once every 3 years starting at age 45. If you are overweight or obese and you are 40-70 years of age, you should be screened for diabetes every year as part of your cardiovascular risk assessment.  Breast cancer screening is essential preventive care for women. You should practice "breast self-awareness." This means understanding the normal appearance and feel of your breasts and may include breast self-examination. Any changes detected, no matter how small, should be reported to a health care provider. Women in their 20s and 30s should have a clinical breast exam (CBE) by a health care provider as part of a regular health exam every 1 to 3 years. After age 40, women should have a CBE every year. Starting at age 40, women should consider having a mammogram (breast X-ray test) every year. Women who have a family history of breast cancer should talk to their health care provider about genetic screening. Women at a high risk of breast cancer should talk to their health care providers about having an MRI and a mammogram every year.  Breast cancer gene (BRCA)-related cancer risk assessment is recommended for women who have family members with BRCA-related cancers. BRCA-related cancers include breast, ovarian, tubal, and peritoneal cancers. Having family members with these cancers may be associated with an increased risk for harmful changes (mutations) in the breast cancer genes BRCA1 and  BRCA2. Results of the assessment will determine the need for genetic counseling and BRCA1 and BRCA2 testing.  Your health care provider may recommend that you be screened regularly for cancer of the pelvic organs (ovaries, uterus, and vagina). This screening involves a pelvic examination, including checking for microscopic changes to the surface of your cervix (Pap test). You may be encouraged to have this screening done every 3 years, beginning at age 21.  For women ages 30-65, health care providers may recommend pelvic exams and Pap testing every 3 years, or they may recommend the Pap and pelvic exam, combined with testing for human papilloma virus (HPV), every 5 years. Some types of HPV increase your risk of cervical cancer. Testing for HPV may also be done on women of any age with unclear Pap test results.  Other health care providers may not recommend any screening for nonpregnant women who are considered low risk for pelvic cancer and who do not have symptoms. Ask your health care provider if a screening pelvic exam is right for   you.  If you have had past treatment for cervical cancer or a condition that could lead to cancer, you need Pap tests and screening for cancer for at least 20 years after your treatment. If Pap tests have been discontinued, your risk factors (such as having a new sexual partner) need to be reassessed to determine if screening should resume. Some women have medical problems that increase the chance of getting cervical cancer. In these cases, your health care provider may recommend more frequent screening and Pap tests.  Colorectal cancer can be detected and often prevented. Most routine colorectal cancer screening begins at the age of 50 years and continues through age 75 years. However, your health care provider may recommend screening at an earlier age if you have risk factors for colon cancer. On a yearly basis, your health care provider may provide home test kits to check  for hidden blood in the stool. Use of a small camera at the end of a tube, to directly examine the colon (sigmoidoscopy or colonoscopy), can detect the earliest forms of colorectal cancer. Talk to your health care provider about this at age 50, when routine screening begins. Direct exam of the colon should be repeated every 5-10 years through age 75 years, unless early forms of precancerous polyps or small growths are found.  People who are at an increased risk for hepatitis B should be screened for this virus. You are considered at high risk for hepatitis B if:  You were born in a country where hepatitis B occurs often. Talk with your health care provider about which countries are considered high risk.  Your parents were born in a high-risk country and you have not received a shot to protect against hepatitis B (hepatitis B vaccine).  You have HIV or AIDS.  You use needles to inject street drugs.  You live with, or have sex with, someone who has hepatitis B.  You get hemodialysis treatment.  You take certain medicines for conditions like cancer, organ transplantation, and autoimmune conditions.  Hepatitis C blood testing is recommended for all people born from 1945 through 1965 and any individual with known risks for hepatitis C.  Practice safe sex. Use condoms and avoid high-risk sexual practices to reduce the spread of sexually transmitted infections (STIs). STIs include gonorrhea, chlamydia, syphilis, trichomonas, herpes, HPV, and human immunodeficiency virus (HIV). Herpes, HIV, and HPV are viral illnesses that have no cure. They can result in disability, cancer, and death.  You should be screened for sexually transmitted illnesses (STIs) including gonorrhea and chlamydia if:  You are sexually active and are younger than 24 years.  You are older than 24 years and your health care provider tells you that you are at risk for this type of infection.  Your sexual activity has changed  since you were last screened and you are at an increased risk for chlamydia or gonorrhea. Ask your health care provider if you are at risk.  If you are at risk of being infected with HIV, it is recommended that you take a prescription medicine daily to prevent HIV infection. This is called preexposure prophylaxis (PrEP). You are considered at risk if:  You are sexually active and do not regularly use condoms or know the HIV status of your partner(s).  You take drugs by injection.  You are sexually active with a partner who has HIV.  Talk with your health care provider about whether you are at high risk of being infected with HIV. If   you choose to begin PrEP, you should first be tested for HIV. You should then be tested every 3 months for as long as you are taking PrEP.  Osteoporosis is a disease in which the bones lose minerals and strength with aging. This can result in serious bone fractures or breaks. The risk of osteoporosis can be identified using a bone density scan. Women ages 67 years and over and women at risk for fractures or osteoporosis should discuss screening with their health care providers. Ask your health care provider whether you should take a calcium supplement or vitamin D to reduce the rate of osteoporosis.  Menopause can be associated with physical symptoms and risks. Hormone replacement therapy is available to decrease symptoms and risks. You should talk to your health care provider about whether hormone replacement therapy is right for you.  Use sunscreen. Apply sunscreen liberally and repeatedly throughout the day. You should seek shade when your shadow is shorter than you. Protect yourself by wearing long sleeves, pants, a wide-brimmed hat, and sunglasses year round, whenever you are outdoors.  Once a month, do a whole body skin exam, using a mirror to look at the skin on your back. Tell your health care provider of new moles, moles that have irregular borders, moles that  are larger than a pencil eraser, or moles that have changed in shape or color.  Stay current with required vaccines (immunizations).  Influenza vaccine. All adults should be immunized every year.  Tetanus, diphtheria, and acellular pertussis (Td, Tdap) vaccine. Pregnant women should receive 1 dose of Tdap vaccine during each pregnancy. The dose should be obtained regardless of the length of time since the last dose. Immunization is preferred during the 27th-36th week of gestation. An adult who has not previously received Tdap or who does not know her vaccine status should receive 1 dose of Tdap. This initial dose should be followed by tetanus and diphtheria toxoids (Td) booster doses every 10 years. Adults with an unknown or incomplete history of completing a 3-dose immunization series with Td-containing vaccines should begin or complete a primary immunization series including a Tdap dose. Adults should receive a Td booster every 10 years.  Varicella vaccine. An adult without evidence of immunity to varicella should receive 2 doses or a second dose if she has previously received 1 dose. Pregnant females who do not have evidence of immunity should receive the first dose after pregnancy. This first dose should be obtained before leaving the health care facility. The second dose should be obtained 4-8 weeks after the first dose.  Human papillomavirus (HPV) vaccine. Females aged 13-26 years who have not received the vaccine previously should obtain the 3-dose series. The vaccine is not recommended for use in pregnant females. However, pregnancy testing is not needed before receiving a dose. If a female is found to be pregnant after receiving a dose, no treatment is needed. In that case, the remaining doses should be delayed until after the pregnancy. Immunization is recommended for any person with an immunocompromised condition through the age of 61 years if she did not get any or all doses earlier. During the  3-dose series, the second dose should be obtained 4-8 weeks after the first dose. The third dose should be obtained 24 weeks after the first dose and 16 weeks after the second dose.  Zoster vaccine. One dose is recommended for adults aged 30 years or older unless certain conditions are present.  Measles, mumps, and rubella (MMR) vaccine. Adults born  before 1957 generally are considered immune to measles and mumps. Adults born in 1957 or later should have 1 or more doses of MMR vaccine unless there is a contraindication to the vaccine or there is laboratory evidence of immunity to each of the three diseases. A routine second dose of MMR vaccine should be obtained at least 28 days after the first dose for students attending postsecondary schools, health care workers, or international travelers. People who received inactivated measles vaccine or an unknown type of measles vaccine during 1963-1967 should receive 2 doses of MMR vaccine. People who received inactivated mumps vaccine or an unknown type of mumps vaccine before 1979 and are at high risk for mumps infection should consider immunization with 2 doses of MMR vaccine. For females of childbearing age, rubella immunity should be determined. If there is no evidence of immunity, females who are not pregnant should be vaccinated. If there is no evidence of immunity, females who are pregnant should delay immunization until after pregnancy. Unvaccinated health care workers born before 1957 who lack laboratory evidence of measles, mumps, or rubella immunity or laboratory confirmation of disease should consider measles and mumps immunization with 2 doses of MMR vaccine or rubella immunization with 1 dose of MMR vaccine.  Pneumococcal 13-valent conjugate (PCV13) vaccine. When indicated, a person who is uncertain of his immunization history and has no record of immunization should receive the PCV13 vaccine. All adults 65 years of age and older should receive this  vaccine. An adult aged 19 years or older who has certain medical conditions and has not been previously immunized should receive 1 dose of PCV13 vaccine. This PCV13 should be followed with a dose of pneumococcal polysaccharide (PPSV23) vaccine. Adults who are at high risk for pneumococcal disease should obtain the PPSV23 vaccine at least 8 weeks after the dose of PCV13 vaccine. Adults older than 53 years of age who have normal immune system function should obtain the PPSV23 vaccine dose at least 1 year after the dose of PCV13 vaccine.  Pneumococcal polysaccharide (PPSV23) vaccine. When PCV13 is also indicated, PCV13 should be obtained first. All adults aged 65 years and older should be immunized. An adult younger than age 65 years who has certain medical conditions should be immunized. Any person who resides in a nursing home or long-term care facility should be immunized. An adult smoker should be immunized. People with an immunocompromised condition and certain other conditions should receive both PCV13 and PPSV23 vaccines. People with human immunodeficiency virus (HIV) infection should be immunized as soon as possible after diagnosis. Immunization during chemotherapy or radiation therapy should be avoided. Routine use of PPSV23 vaccine is not recommended for American Indians, [place] Natives, or people younger than 65 years unless there are medical conditions that require PPSV23 vaccine. When indicated, people who have unknown immunization and have no record of immunization should receive PPSV23 vaccine. One-time revaccination 5 years after the first dose of PPSV23 is recommended for people aged 19-64 years who have chronic kidney failure, nephrotic syndrome, asplenia, or immunocompromised conditions. People who received 1-2 doses of PPSV23 before age 65 years should receive another dose of PPSV23 vaccine at age 65 years or later if at least 5 years have passed since the previous dose. Doses of PPSV23 are not  needed for people immunized with PPSV23 at or after age 65 years.  Meningococcal vaccine. Adults with asplenia or persistent complement component deficiencies should receive 2 doses of quadrivalent meningococcal conjugate (MenACWY-D) vaccine. The doses should be obtained   at least 2 months apart. Microbiologists working with certain meningococcal bacteria, [place] recruits, people at risk during an outbreak, and people who travel to or live in countries with a high rate of meningitis should be immunized. A first-year college student up through age 34 years who is living in a residence hall should receive a dose if she did not receive a dose on or after her 16th birthday. Adults who have certain high-risk conditions should receive one or more doses of vaccine.  Hepatitis A vaccine. Adults who wish to be protected from this disease, have certain high-risk conditions, work with hepatitis A-infected animals, work in hepatitis A research labs, or travel to or work in countries with a high rate of hepatitis A should be immunized. Adults who were previously unvaccinated and who anticipate close contact with an international adoptee during the first 60 days after arrival in the [place] from a country with a high rate of hepatitis A should be immunized.  Hepatitis B vaccine. Adults who wish to be protected from this disease, have certain high-risk conditions, may be exposed to blood or other infectious body fluids, are household contacts or sex partners of hepatitis B positive people, are clients or workers in certain care facilities, or travel to or work in countries with a high rate of hepatitis B should be immunized.  Haemophilus influenzae type b (Hib) vaccine. A previously unvaccinated person with asplenia or sickle cell disease or having a scheduled splenectomy should receive 1 dose of Hib vaccine. Regardless of previous immunization, a recipient of a hematopoietic stem cell transplant should receive a  3-dose series 6-12 months after her successful transplant. Hib vaccine is not recommended for adults with HIV infection. Preventive Services / Frequency Ages 35 to 4 years  Blood pressure check.** / Every 3-5 years.  Lipid and cholesterol check.** / Every 5 years beginning at age 60.  Clinical breast exam.** / Every 3 years for women in their 71s and 10s.  BRCA-related cancer risk assessment.** / For women who have family members with a BRCA-related cancer (breast, ovarian, tubal, or peritoneal cancers).  Pap test.** / Every 2 years from ages 76 through 26. Every 3 years starting at age 61 through age 76 or 93 with a history of 3 consecutive normal Pap tests.  HPV screening.** / Every 3 years from ages 37 through ages 60 to 51 with a history of 3 consecutive normal Pap tests.  Hepatitis C blood test.** / For any individual with known risks for hepatitis C.  Skin self-exam. / Monthly.  Influenza vaccine. / Every year.  Tetanus, diphtheria, and acellular pertussis (Tdap, Td) vaccine.** / Consult your health care provider. Pregnant women should receive 1 dose of Tdap vaccine during each pregnancy. 1 dose of Td every 10 years.  Varicella vaccine.** / Consult your health care provider. Pregnant females who do not have evidence of immunity should receive the first dose after pregnancy.  HPV vaccine. / 3 doses over 6 months, if 93 and younger. The vaccine is not recommended for use in pregnant females. However, pregnancy testing is not needed before receiving a dose.  Measles, mumps, rubella (MMR) vaccine.** / You need at least 1 dose of MMR if you were born in 1957 or later. You may also need a 2nd dose. For females of childbearing age, rubella immunity should be determined. If there is no evidence of immunity, females who are not pregnant should be vaccinated. If there is no evidence of immunity, females who are  pregnant should delay immunization until after pregnancy.  Pneumococcal  13-valent conjugate (PCV13) vaccine.** / Consult your health care provider.  Pneumococcal polysaccharide (PPSV23) vaccine.** / 1 to 2 doses if you smoke cigarettes or if you have certain conditions.  Meningococcal vaccine.** / 1 dose if you are age 68 to 8 years and a Market researcher living in a residence hall, or have one of several medical conditions, you need to get vaccinated against meningococcal disease. You may also need additional booster doses.  Hepatitis A vaccine.** / Consult your health care provider.  Hepatitis B vaccine.** / Consult your health care provider.  Haemophilus influenzae type b (Hib) vaccine.** / Consult your health care provider. Ages 7 to 53 years  Blood pressure check.** / Every year.  Lipid and cholesterol check.** / Every 5 years beginning at age 25 years.  Lung cancer screening. / Every year if you are aged 11-80 years and have a 30-pack-year history of smoking and currently smoke or have quit within the past 15 years. Yearly screening is stopped once you have quit smoking for at least 15 years or develop a health problem that would prevent you from having lung cancer treatment.  Clinical breast exam.** / Every year after age 48 years.  BRCA-related cancer risk assessment.** / For women who have family members with a BRCA-related cancer (breast, ovarian, tubal, or peritoneal cancers).  Mammogram.** / Every year beginning at age 41 years and continuing for as long as you are in good health. Consult with your health care provider.  Pap test.** / Every 3 years starting at age 65 years through age 37 or 70 years with a history of 3 consecutive normal Pap tests.  HPV screening.** / Every 3 years from ages 72 years through ages 60 to 40 years with a history of 3 consecutive normal Pap tests.  Fecal occult blood test (FOBT) of stool. / Every year beginning at age 21 years and continuing until age 5 years. You may not need to do this test if you get  a colonoscopy every 10 years.  Flexible sigmoidoscopy or colonoscopy.** / Every 5 years for a flexible sigmoidoscopy or every 10 years for a colonoscopy beginning at age 35 years and continuing until age 48 years.  Hepatitis C blood test.** / For all people born from [birth year] through 1965 and any individual with known risks for hepatitis C.  Skin self-exam. / Monthly.  Influenza vaccine. / Every year.  Tetanus, diphtheria, and acellular pertussis (Tdap/Td) vaccine.** / Consult your health care provider. Pregnant women should receive 1 dose of Tdap vaccine during each pregnancy. 1 dose of Td every 10 years.  Varicella vaccine.** / Consult your health care provider. Pregnant females who do not have evidence of immunity should receive the first dose after pregnancy.  Zoster vaccine.** / 1 dose for adults aged 30 years or older.  Measles, mumps, rubella (MMR) vaccine.** / You need at least 1 dose of MMR if you were born in 1957 or later. You may also need a second dose. For females of childbearing age, rubella immunity should be determined. If there is no evidence of immunity, females who are not pregnant should be vaccinated. If there is no evidence of immunity, females who are pregnant should delay immunization until after pregnancy.  Pneumococcal 13-valent conjugate (PCV13) vaccine.** / Consult your health care provider.  Pneumococcal polysaccharide (PPSV23) vaccine.** / 1 to 2 doses if you smoke cigarettes or if you have certain conditions.  Meningococcal vaccine.** /  Consult your health care provider.  Hepatitis A vaccine.** / Consult your health care provider.  Hepatitis B vaccine.** / Consult your health care provider.  Haemophilus influenzae type b (Hib) vaccine.** / Consult your health care provider. Ages 64 years and over  Blood pressure check.** / Every year.  Lipid and cholesterol check.** / Every 5 years beginning at age 23 years.  Lung cancer screening. / Every year if you  are aged 16-80 years and have a 30-pack-year history of smoking and currently smoke or have quit within the past 15 years. Yearly screening is stopped once you have quit smoking for at least 15 years or develop a health problem that would prevent you from having lung cancer treatment.  Clinical breast exam.** / Every year after age 74 years.  BRCA-related cancer risk assessment.** / For women who have family members with a BRCA-related cancer (breast, ovarian, tubal, or peritoneal cancers).  Mammogram.** / Every year beginning at age 44 years and continuing for as long as you are in good health. Consult with your health care provider.  Pap test.** / Every 3 years starting at age 58 years through age 22 or 39 years with 3 consecutive normal Pap tests. Testing can be stopped between 65 and 70 years with 3 consecutive normal Pap tests and no abnormal Pap or HPV tests in the past 10 years.  HPV screening.** / Every 3 years from ages 64 years through ages 70 or 61 years with a history of 3 consecutive normal Pap tests. Testing can be stopped between 65 and 70 years with 3 consecutive normal Pap tests and no abnormal Pap or HPV tests in the past 10 years.  Fecal occult blood test (FOBT) of stool. / Every year beginning at age 40 years and continuing until age 27 years. You may not need to do this test if you get a colonoscopy every 10 years.  Flexible sigmoidoscopy or colonoscopy.** / Every 5 years for a flexible sigmoidoscopy or every 10 years for a colonoscopy beginning at age 7 years and continuing until age 32 years.  Hepatitis C blood test.** / For all people born from 65 through 1965 and any individual with known risks for hepatitis C.  Osteoporosis screening.** / A one-time screening for women ages 30 years and over and women at risk for fractures or osteoporosis.  Skin self-exam. / Monthly.  Influenza vaccine. / Every year.  Tetanus, diphtheria, and acellular pertussis (Tdap/Td)  vaccine.** / 1 dose of Td every 10 years.  Varicella vaccine.** / Consult your health care provider.  Zoster vaccine.** / 1 dose for adults aged 35 years or older.  Pneumococcal 13-valent conjugate (PCV13) vaccine.** / Consult your health care provider.  Pneumococcal polysaccharide (PPSV23) vaccine.** / 1 dose for all adults aged 46 years and older.  Meningococcal vaccine.** / Consult your health care provider.  Hepatitis A vaccine.** / Consult your health care provider.  Hepatitis B vaccine.** / Consult your health care provider.  Haemophilus influenzae type b (Hib) vaccine.** / Consult your health care provider. ** Family history and personal history of risk and conditions may change your health care provider's recommendations.   This information is not intended to replace advice given to you by your health care provider. Make sure you discuss any questions you have with your health care provider.   Document Released: 12/19/2001 Document Revised: 11/13/2014 Document Reviewed: 03/20/2011 Elsevier Interactive Patient Education [employer].

## 2016-04-27 LAB — CYTOLOGY - PAP

## 2016-05-24 ENCOUNTER — Encounter (HOSPITAL_COMMUNITY): Payer: Self-pay | Admitting: Psychiatry

## 2016-05-24 ENCOUNTER — Ambulatory Visit (INDEPENDENT_AMBULATORY_CARE_PROVIDER_SITE_OTHER): Payer: Medicare Other | Admitting: Psychiatry

## 2016-05-24 VITALS — BP 128/72 | HR 83 | Ht 64.0 in | Wt 262.8 lb

## 2016-05-24 DIAGNOSIS — F4010 Social phobia, unspecified: Secondary | ICD-10-CM

## 2016-05-24 DIAGNOSIS — G4700 Insomnia, unspecified: Secondary | ICD-10-CM | POA: Diagnosis not present

## 2016-05-24 DIAGNOSIS — E669 Obesity, unspecified: Secondary | ICD-10-CM

## 2016-05-24 DIAGNOSIS — F332 Major depressive disorder, recurrent severe without psychotic features: Secondary | ICD-10-CM | POA: Diagnosis not present

## 2016-05-24 MED ORDER — ACAMPROSATE CALCIUM 333 MG PO TBEC: 666.0000 mg | DELAYED_RELEASE_TABLET | Freq: Two times a day (BID) | ORAL | Status: DC

## 2016-05-24 NOTE — Progress Notes (Signed)
Patient ID: Julie Powell, female   DOB: [birth year], 55 y.o.   MRN: [id number] [place] Follow-up Outpatient Visit  Sibylla Solem 10/04/1961   Date: 02/29/2016  Chief Complaint  Patient presents with  . Follow-up    HPI Comments: Ms. Louissaint is a 55  Y/O woman with a past psychiatric history Major Depressive Disorder, severe, recurrent. Mood disorder due to [facility] and Social Phobia.  The patient is referred for psychiatric services for  medication management.   Feels low about her self and mostly stays at home, apparently has started drinking more out of lonliness. She has had a bypass surgery in the past still her weight has fluctuated. Sleep at times reasonable but at times she worries and that keeps her awake.  No suicidal tougths.  No reported side effects.  Remains concern of her obesity as she has fluctuation of weight despite Gastric bypass in 2002. She avoid family says what would they say about my weight. . Severity: Depression: 6/10 (0=Very depressed; 5=Neutral; 10=Very Happy)-  Anxiety- 5/10 (0=no anxiety; 5= moderate/tolerable anxiety; 10= panic attacks)  . Duration: Since age 5, had a suicide attempt at age 61.   . Timing: Worse in the evenings   . Context: She perceives herself with low self esteem.   . Modifying factors: medications, her son  . Associated signs and symptoms: No delusions hallucinations or manic symptoms   Traumatic Brain Injury: No none   Review of Systems  Constitutional: Negative for fever.  Cardiovascular: Negative for chest pain and palpitations.  Musculoskeletal: Positive for back pain.  Skin: Negative for itching.  Neurological: Negative for tingling and headaches.  Psychiatric/Behavioral: Positive for depression. Negative for suicidal ideas.   Filed Vitals:   05/24/16 1023  BP: 128/72  Pulse: 83  Height: 5\' 4"  (1.626 m)  Weight: 262 lb 12.8 oz (119.205 kg)  SpO2: 96%   Physical Exam  Constitutional: She appears  well-developed and well-nourished. No distress.  Skin: She is not diaphoretic.  No tremors of hands noted while patient was interviewed.  Musculoskeletal: Gait & Station: normal Patient leans: N/A   Past Psychiatric History: Reviewed   Diagnosis: Major Depressive Disorder, recurrent, severe   Hospitalizations: Several since age 23   Outpatient Care: Since   Substance Abuse Care: none   Self-Mutilation: Patient denies   Suicidal Attempts: Three attempts   Violent Behaviors: Patient denies.    Past Medical History: Reviewed  Past Medical History  Diagnosis Date  . Hypertension   . Bipolar 1 disorder ([place]) 2010    ect  . Anxiety   . Asthma   . Fibroids   . Plantar fasciitis 2013  . Sciatica     RT hip  . B12 deficiency   . Vitamin D deficiency    History of Loss of Consciousness: No  Seizure History: No  Cardiac History: Yes-HTN  Allergies: No Known Allergies   Current Medications: Reviewed   Current Outpatient Prescriptions on File Prior to Visit  Medication Sig Dispense Refill  . buPROPion (WELLBUTRIN) 100 MG tablet Take 2 tablets (200 mg total) by mouth 2 (two) times daily. 360 tablet 0  . cholecalciferol (VITAMIN D) 1000 UNITS tablet Take 5,000 Units by mouth daily.    . Cyanocobalamin 2500 MCG SUBL Place 1 tablet (2,500 mcg total) under the tongue 3 (three) times a week.  0  . Flaxseed, Linseed, (FLAX SEED OIL PO) Take by mouth.    . hyoscyamine (LEVBID) 0.375 MG 12 hr tablet Take  0.375 mg by mouth daily.  0  . lisinopril (PRINIVIL,ZESTRIL) 10 MG tablet take 1 tablet by mouth once daily 90 tablet 0  . lithium carbonate (LITHOBID) 300 MG CR tablet Take 1 tablet (300 mg total) by mouth 2 (two) times daily. 180 tablet 0  . Multiple Vitamin (MULTIVITAMIN) tablet Take 1 tablet by mouth daily.    Marland Kitchen PARoxetine (PAXIL) 30 MG tablet Take 1 tablet (30 mg total) by mouth every morning. 90 tablet 0  . PROAIR HFA 108 (90 BASE) MCG/ACT inhaler inhale 2 puffs by mouth every 6  hours if needed 25.5 g 1   No current facility-administered medications on file prior to visit.      Family History: Reviewed  Family History   Problem  Relation  Age of Onset   .  Adopted: Yes   .  Aneurysm  Father     Psychiatric Specialty Exam:  Objective: Appearance: Well Groomed   Eye Contact:: Good   Speech: Clear and Coherent and Normal Rate   Volume: Normal   Mood: somewhat dysphoric   Affect: Appropriate, Congruent and Restricted   Thought Process: Goal Directed, Logical and Loose   Orientation: Full   Thought Content: WDL   Suicidal Thoughts: Patient denies   Homicidal Thoughts: Patient denies   Judgement: Fair   Insight: Fair   Psychomotor Activity: Normal   Akathisia: No   Handed: Right   Memory: Immediate 3/3, Recent: 1/3   [place] of knowledge-average to above average.  AIMS (if indicated): Not indicated at this time.   Assets: Communication Skills  Desire for Improvement    Assessment:  AXIS I: Major Depressive Disorder, severe, recurrent-- not worsen Social Phobia-slightly improving. Mood symptoms due to GMC/back problems/obesity Has    Treatment Plan/Recommendations:   Plan of Care:  PLAN:  1. Affirm with the patient that the medications are taken as ordered. Patient  expressed understanding of how their medications were to be used.    Laboratory:  reveiwed  Psychotherapy: Therapy: brief supportive therapy provided. Discussed psychosocial stressors. Again, encouraged patient to continue spending time daily making changes to 4 important areas of her life. More than 50% of the visit was spent on individual therapy.   Medications:  Continue the following psychiatric medications as written prior to this appointment with the following changes::  Refills sent for lithium, paxil and wellbutrin. Does not want to increase doses. Will add campral 333mg  bid for alcohol craving. Explained side effects   Advised to avoid alcohol intake,.   Medications keep some balance. Understands today that therapy may help low self esteem and will refer. Work in therapy -Risks and benefts, side effects and alternatives discussed with patient, she was given an opportunity to ask questions about her medication, illness, and treatment. All current psychiatric medications have been reviewed and discussed with the patient and adjusted as clinically appropriate. The patient has been provided an accurate and updated list of the medications being now prescribed.     Follow up in 2 months or earlier if needed.      Time Spent: 25 minutes Merian Capron, M.D.  05/24/2016 10:49 AM

## 2016-05-25 ENCOUNTER — Other Ambulatory Visit: Payer: Self-pay | Admitting: Family

## 2016-05-30 ENCOUNTER — Telehealth (HOSPITAL_COMMUNITY): Payer: Self-pay | Admitting: *Deleted

## 2016-05-30 NOTE — Telephone Encounter (Signed)
Patient phone into office. Pt will need a letter allowing her to have a service animal in her apartment. Informed pt provider will be out of the office this week. Pt states she will need the letter by 06/07/16.

## 2016-06-06 ENCOUNTER — Encounter (HOSPITAL_COMMUNITY): Payer: Self-pay | Admitting: *Deleted

## 2016-06-06 NOTE — Telephone Encounter (Signed)
Printed for mail or pick up

## 2016-06-06 NOTE — Telephone Encounter (Signed)
Letter printed and mailed to pt. Called and informed pt.

## 2016-06-16 ENCOUNTER — Telehealth (HOSPITAL_COMMUNITY): Payer: Self-pay | Admitting: *Deleted

## 2016-06-16 NOTE — Telephone Encounter (Signed)
Pt called office to speak with Dr. De Nurse,  [place] states her rental company needs more information for the service animal letter. Please contact Lorriane Shire at [phone] for the information to be include in letter.

## 2016-06-27 NOTE — Telephone Encounter (Signed)
Pt called office requesting a letter to be written for a service animal. Letter was completed and faxed to property manager on 06/06/16.  Pt states Secondary school teacher request additional information from provider.   Spoke w/ Wille Glaser at [employer] at [phone]. Per Wille Glaser he will fax the document Dr. De Nurse will need to complete for a service animal.

## 2016-07-06 ENCOUNTER — Other Ambulatory Visit (HOSPITAL_COMMUNITY): Payer: Self-pay | Admitting: Psychiatry

## 2016-07-06 DIAGNOSIS — F332 Major depressive disorder, recurrent severe without psychotic features: Secondary | ICD-10-CM

## 2016-07-07 MED ORDER — LITHIUM CARBONATE ER 300 MG PO TBCR: 300.0000 mg | EXTENDED_RELEASE_TABLET | Freq: Two times a day (BID) | ORAL | 0 refills | Status: DC

## 2016-07-07 NOTE — Telephone Encounter (Signed)
Received fax from [facility] requesting a refill for Lithium and Paxil. Per Dr. De Nurse, refills are authorized for Lithium 300mg , #180 and Paxil 30mg , #90. Rx's were escribed to pharmacy. Pt f/u appt is schedule on 07/19/16. Called and informed pt of refill status. Pt verbalizes understanding.

## 2016-07-19 ENCOUNTER — Ambulatory Visit (INDEPENDENT_AMBULATORY_CARE_PROVIDER_SITE_OTHER): Payer: Medicare Other | Admitting: Psychiatry

## 2016-07-19 ENCOUNTER — Encounter (HOSPITAL_COMMUNITY): Payer: Self-pay | Admitting: Psychiatry

## 2016-07-19 VITALS — BP 124/72 | HR 83 | Resp 16 | Ht 64.0 in | Wt 262.0 lb

## 2016-07-19 DIAGNOSIS — E669 Obesity, unspecified: Secondary | ICD-10-CM

## 2016-07-19 DIAGNOSIS — F4010 Social phobia, unspecified: Secondary | ICD-10-CM

## 2016-07-19 DIAGNOSIS — F332 Major depressive disorder, recurrent severe without psychotic features: Secondary | ICD-10-CM

## 2016-07-19 DIAGNOSIS — G4700 Insomnia, unspecified: Secondary | ICD-10-CM | POA: Diagnosis not present

## 2016-07-19 LAB — TSH: TSH: 1.64 mIU/L

## 2016-07-19 MED ORDER — BUPROPION HCL 100 MG PO TABS: 200.0000 mg | ORAL_TABLET | Freq: Two times a day (BID) | ORAL | 0 refills | Status: DC

## 2016-07-19 NOTE — Progress Notes (Signed)
Patient ID: Julie Powell, female   DOB: July 07, 1961, 55 y.o.   MRN: [id number] [place] Follow-up Outpatient Visit  Julie Powell [birth year]   Date: 02/29/2016  Chief Complaint  Patient presents with  . Follow-up    HPI Comments: [name] is a 54  Y/O woman with a past psychiatric history Major Depressive Disorder, severe, recurrent. Mood disorder due to [facility] and Social Phobia.  The patient is referred for psychiatric services for  medication management.   Last visit we started Campral 333 mg 2 times a day and that has helped the craving she is not drinking as much she has cut down from fifth of liquor to one or 2 beers at night She is feeling more alert and less dysthymic and feels more comfortable with her current situation chief with the medication and also helping better since she is not drinking.  No suicidal tougths.  No reported side effects.  Remains concern of her obesity as she has fluctuation of weight despite Gastric bypass in 2002. She avoid family says what would they say about my weight. . Severity: Depression: 6/10 (0=Very depressed; 5=Neutral; 10=Very Happy)-  Anxiety- 5/10 (0=no anxiety; 5= moderate/tolerable anxiety; 10= panic attacks)  . Duration: Since age 26, had a suicide attempt at age 79.   . Timing: Worse in the evenings   . Context: She perceives herself with low self esteem.   . Modifying factors: medications, her son  . Associated signs and symptoms: No delusions hallucinations or manic symptoms   Traumatic Brain Injury: No none   Review of Systems  Constitutional: Negative for fever.  Cardiovascular: Negative for chest pain and palpitations.  Skin: Negative for itching.  Neurological: Negative for tremors and headaches.  Psychiatric/Behavioral: Negative for suicidal ideas.   Vitals:   07/19/16 1028  BP: 124/72  BP Location: Right Arm  Patient Position: Sitting  Cuff Size: Normal  Pulse: 83  Resp: 16  SpO2: 96%  Weight:  262 lb (118.8 kg)  Height: 5\' 4"  (1.626 m)   Physical Exam  Constitutional: She appears well-developed and well-nourished. No distress.  Skin: She is not diaphoretic.  No tremors of hands noted while patient was interviewed.  Musculoskeletal: Gait & Station: normal Patient leans: N/A   Past Psychiatric History: Reviewed   Diagnosis: Major Depressive Disorder, recurrent, severe   Hospitalizations: Several since age 70   Outpatient Care: Since   Substance Abuse Care: none   Self-Mutilation: Patient denies   Suicidal Attempts: Three attempts   Violent Behaviors: Patient denies.    Past Medical History: Reviewed  Past Medical History:  Diagnosis Date  . Anxiety   . Asthma   . B12 deficiency   . Bipolar 1 disorder ([place]) 2010   ect  . Fibroids   . Hypertension   . Plantar fasciitis 2013  . Sciatica    RT hip  . Vitamin D deficiency    History of Loss of Consciousness: No  Seizure History: No  Cardiac History: Yes-HTN  Allergies: No Known Allergies   Current Medications: Reviewed   Current Outpatient Prescriptions on File Prior to Visit  Medication Sig Dispense Refill  . acamprosate (CAMPRAL) 333 MG tablet Take 2 tablets (666 mg total) by mouth 2 (two) times daily with a meal. 120 tablet 0  . cholecalciferol (VITAMIN D) 1000 UNITS tablet Take 5,000 Units by mouth daily.    . Cyanocobalamin 2500 MCG SUBL Place 1 tablet (2,500 mcg total) under the tongue 3 (three) times a  week.  0  . Flaxseed, Linseed, (FLAX SEED OIL PO) Take by mouth.    . hyoscyamine (LEVBID) 0.375 MG 12 hr tablet Take 0.375 mg by mouth daily.  0  . lisinopril (PRINIVIL,ZESTRIL) 10 MG tablet take 1 tablet by mouth once daily 90 tablet 0  . lithium carbonate (LITHOBID) 300 MG CR tablet Take 1 tablet (300 mg total) by mouth 2 (two) times daily. 180 tablet 0  . Multiple Vitamin (MULTIVITAMIN) tablet Take 1 tablet by mouth daily.    Marland Kitchen PARoxetine (PAXIL) 30 MG tablet take 1 tablet by mouth every morning 90  tablet 0  . PROAIR HFA 108 (90 Base) MCG/ACT inhaler inhale 2 puffs by mouth every 6 hours if needed 25.5 Inhaler 1   No current facility-administered medications on file prior to visit.       Family History: Reviewed  Family History   Problem  Relation  Age of Onset   .  Adopted: Yes   .  Aneurysm  Father     Psychiatric Specialty Exam:  Objective: Appearance: Well Groomed   Eye Contact:: Good   Speech: Clear and Coherent and Normal Rate   Volume: Normal   Mood: less dysphoric  Affect: Appropriate, Congruent and Restricted   Thought Process: Goal Directed, Logical and Loose   Orientation: Full   Thought Content: WDL   Suicidal Thoughts: Patient denies   Homicidal Thoughts: Patient denies   Judgement: Fair   Insight: Fair   Psychomotor Activity: Normal   Akathisia: No   Handed: Right   Memory: Immediate 3/3, Recent: 1/3   [place] of knowledge-average to above average.  AIMS (if indicated): Not indicated at this time.   Assets: Communication Skills  Desire for Improvement    Assessment:  AXIS I: Major Depressive Disorder, severe, recurrent-- not worsen Social Phobia-slightly improving. Mood symptoms due to GMC/back problems/obesity Has    Treatment Plan/Recommendations:   Plan of Care:  PLAN:  1. Affirm with the patient that the medications are taken as ordered. Patient  expressed understanding of how their medications were to be used.    Laboratory:  reveiwed  Psychotherapy: Therapy: brief supportive therapy provided. Discussed psychosocial stressors. Again, encouraged patient to continue spending time daily making changes to 4 important areas of her life. More than 50% of the visit was spent on individual therapy.   Medications:  Continue the following psychiatric medications as written prior to this appointment with the following changes::  Refills sent for  wellbutrin 90 day supply. Has sent refills for lithium and paxil before.  Alcohol use:  continue camprall but more regularly as she skips doses.  No side effects Wrote lithium and tSh to be done at lab.  Advised to avoid alcohol intake, further and stop.  Medications keep some balance. Understands today that therapy may help low self esteem and will refer. Work in therapy -Risks and benefts, side effects and alternatives discussed with patient, she was given an opportunity to ask questions about her medication, illness, and treatment. All current psychiatric medications have been reviewed and discussed with the patient and adjusted as clinically appropriate. The patient has been provided an accurate and updated list of the medications being now prescribed.     Follow up in 2 months or earlier if needed.      Time Spent: 25 minutes Merian Capron, M.D.  07/19/2016 10:43 AM

## 2016-07-20 LAB — LITHIUM LEVEL: Lithium Lvl: 0.5 mmol/L — ABNORMAL LOW (ref 0.6–1.2)

## 2016-09-13 ENCOUNTER — Ambulatory Visit (INDEPENDENT_AMBULATORY_CARE_PROVIDER_SITE_OTHER): Payer: Medicare Other | Admitting: Family

## 2016-09-13 ENCOUNTER — Encounter: Payer: Self-pay | Admitting: Family

## 2016-09-13 VITALS — BP 124/85 | HR 78 | Temp 97.8°F | Resp 16 | Ht 64.0 in | Wt 265.2 lb

## 2016-09-13 DIAGNOSIS — I159 Secondary hypertension, unspecified: Secondary | ICD-10-CM

## 2016-09-13 DIAGNOSIS — E669 Obesity, unspecified: Secondary | ICD-10-CM | POA: Diagnosis not present

## 2016-09-13 DIAGNOSIS — F329 Major depressive disorder, single episode, unspecified: Secondary | ICD-10-CM

## 2016-09-13 DIAGNOSIS — Z23 Encounter for immunization: Secondary | ICD-10-CM | POA: Diagnosis not present

## 2016-09-13 DIAGNOSIS — F1010 Alcohol abuse, uncomplicated: Secondary | ICD-10-CM | POA: Diagnosis not present

## 2016-09-13 LAB — BASIC METABOLIC PANEL
BUN: 9 mg/dL (ref 6–23)
CO2: 31 mEq/L (ref 19–32)
Calcium: 9.9 mg/dL (ref 8.4–10.5)
Chloride: 102 mEq/L (ref 96–112)
Creatinine, Ser: 0.74 mg/dL (ref 0.40–1.20)
GFR: 105.55 mL/min (ref >60.00)
Glucose, Bld: 82 mg/dL (ref 70–99)
Potassium: 4.1 mEq/L (ref 3.5–5.1)
Sodium: 139 mEq/L (ref 135–145)

## 2016-09-13 MED ORDER — LISINOPRIL 10 MG PO TABS: 10.0000 mg | ORAL_TABLET | Freq: Every day | ORAL | 1 refills | Status: DC

## 2016-09-13 NOTE — Progress Notes (Signed)
Pre visit review using our clinic review tool, if applicable. No additional management support is needed unless otherwise documented below in the visit note. 

## 2016-09-13 NOTE — Patient Instructions (Addendum)
Please contact your your insurance to see if they cover belviq or saxenda.   Work on eliminating alcohol and eating a more balanced diet. Try to walk a little each day with a goal of 30 minutes 5 days a week.  Please attend a local alcoholic Anonymous meeting.

## 2016-09-13 NOTE — Progress Notes (Signed)
Subjective:    Patient ID: Julie Powell, female    DOB: May 18, 1961, 55 y.o.   MRN: [id number]  HPI   She is not exercising much, dit is OK but portions are too large.   Wt Readings from Last 3 Encounters:  09/13/16 265 lb 3.2 oz (120.3 kg)  04/26/16 260 lb (117.9 kg)  03/21/16 264 lb (119.7 kg)    24 hour diet recall-    Spaghetti with sauce (meatballs) yesterday for lunch Repeated above with salad for lunch Dinner- garlic bread (2 slices) with spaghetti Had 2 bottles of wine last night  Depression- continues to work with psychiatry, does not feel like like it is well controlled.  Withdrawn from her friends and family.   Review of Systems See HPI  Past Medical History:  Diagnosis Date  . Anxiety   . Asthma   . B12 deficiency   . Bipolar 1 disorder ([place]) 2010   ect  . Fibroids   . Hypertension   . Plantar fasciitis 2013  . Sciatica    RT hip  . Vitamin D deficiency      Social History   Social History  . Marital status: Divorced    Spouse name: N/A  . Number of children: N/A  . Years of education: N/A   Occupational History  . Not on file.   Social History Main Topics  . Smoking status: Former Smoker    Packs/day: 0.50    Years: 10.00    Types: Cigarettes    Quit date: 11/07/1991  . Smokeless tobacco: Never Used  . Alcohol use 0.0 oz/week     Comment: Pt states she drinks half of a fifth of liquor per day  . Drug use: No     Comment: Caffeine- Carbonated beverage 16 ounces.  . Sexual activity: No   Other Topics Concern  . Not on file   Social History Narrative   Lives with 1 year old son   She is on disability due to depression   In the past she has worked as a Education officer, community for state of [place]   Former [employer]   Completed bachelor's degree   Divorced.   Enjoys television    Past Surgical History:  Procedure Laterality Date  . CESAREAN SECTION  1995  . CYSTECTOMY  1998   "thyroglycol duct cyst in neck" had dysphagia, was told benign  . GASTRIC BYPASS  2003  . MICRODISCECTOMY LUMBAR  09/11/14   L3-L4  Dr Saintclair Halsted  . PILONIDAL CYST DRAINAGE  1982  . SP CHOLECYSTOMY  1986    Family History  Problem Relation Age of Onset  . Adopted: Yes  . Aneurysm Father   . Alcohol abuse Father   . Alcohol abuse Brother   . Alcohol abuse Brother   . Cancer Brother     prostate  . Alcohol abuse Brother   . Lung cancer Sister   . Cancer Sister     lung  . Thyroid disease Neg Hx   . Diabetes Neg Hx   . Hypertension Neg Hx     No Known Allergies  Current Outpatient Prescriptions on File Prior to Visit  Medication Sig Dispense Refill  . buPROPion (WELLBUTRIN) 100 MG tablet Take 2 tablets (200 mg total) by mouth 2 (two) times daily. 360 tablet 0  . cholecalciferol (VITAMIN D) 1000 UNITS tablet Take 5,000 Units by mouth daily.    . Cyanocobalamin 2500 MCG SUBL Place 1  tablet (2,500 mcg total) under the tongue 3 (three) times a week.  0  . Flaxseed, Linseed, (FLAX SEED OIL PO) Take by mouth.    . hyoscyamine (LEVBID) 0.375 MG 12 hr tablet Take 0.375 mg by mouth daily.  0  . lisinopril (PRINIVIL,ZESTRIL) 10 MG tablet take 1 tablet by mouth once daily 90 tablet 0  . lithium carbonate (LITHOBID) 300 MG CR tablet Take 1 tablet (300 mg total) by mouth 2 (two) times daily. 180 tablet 0  . Multiple Vitamin (MULTIVITAMIN) tablet Take 1 tablet by mouth daily.    Marland Kitchen PARoxetine (PAXIL) 30 MG tablet take 1 tablet by mouth every morning 90 tablet 0  . PROAIR HFA 108 (90 Base) MCG/ACT inhaler inhale 2 puffs by mouth every 6 hours if needed 25.5 Inhaler 1   No current facility-administered medications on file prior to visit.     BP 124/85 (BP Location: Left Arm, Patient Position: Sitting, Cuff Size: Large)   Pulse 78   Temp 97.8 F (36.6 C) (Oral)   Resp 16   Ht 5\' 4"  (1.626 m)   Wt 265 lb 3.2 oz (120.3 kg)   LMP 09/11/2013   SpO2 100% Comment: RA  BMI 45.52 kg/m         Objective:   Physical Exam  Constitutional: She is oriented to person, place, and time. She appears well-developed and well-nourished. No distress.  Neurological: She is alert and oriented to person, place, and time.  Psychiatric: Her behavior is normal. Judgment and thought content normal.  Flat affect          Assessment & Plan:  Depression- fair control, advised her to follow up with psychiatry.  Alcohol abuse- I strongly encouraged her to join AA. She will consider.  Obesity- I think that this contributes to her depression and withdrawing from others. We discussed that cutting back on her alcohol will really help her to lose weight as well as focusing on a more balanced, lower carbohydrate diet.   25 min spent with pt.  >50% of this time was spent counseling pt depression, alcohol abuse and obesity/weight loss.

## 2016-09-19 ENCOUNTER — Ambulatory Visit (INDEPENDENT_AMBULATORY_CARE_PROVIDER_SITE_OTHER): Payer: Medicare Other | Admitting: Psychiatry

## 2016-09-19 ENCOUNTER — Encounter (HOSPITAL_COMMUNITY): Payer: Self-pay | Admitting: Psychiatry

## 2016-09-19 VITALS — BP 118/70 | HR 92 | Resp 14 | Ht 64.0 in | Wt 265.0 lb

## 2016-09-19 DIAGNOSIS — F332 Major depressive disorder, recurrent severe without psychotic features: Secondary | ICD-10-CM | POA: Diagnosis not present

## 2016-09-19 DIAGNOSIS — Z79899 Other long term (current) drug therapy: Secondary | ICD-10-CM

## 2016-09-19 DIAGNOSIS — F3130 Bipolar disorder, current episode depressed, mild or moderate severity, unspecified: Secondary | ICD-10-CM | POA: Diagnosis not present

## 2016-09-19 DIAGNOSIS — F4010 Social phobia, unspecified: Secondary | ICD-10-CM

## 2016-09-19 DIAGNOSIS — F5102 Adjustment insomnia: Secondary | ICD-10-CM | POA: Diagnosis not present

## 2016-09-19 MED ORDER — LITHIUM CARBONATE ER 300 MG PO TBCR: 300.0000 mg | EXTENDED_RELEASE_TABLET | Freq: Two times a day (BID) | ORAL | 0 refills | Status: DC

## 2016-09-19 MED ORDER — BUPROPION HCL 100 MG PO TABS: 200.0000 mg | ORAL_TABLET | Freq: Two times a day (BID) | ORAL | 0 refills | Status: DC

## 2016-09-19 MED ORDER — PAROXETINE HCL 30 MG PO TABS: 30.0000 mg | ORAL_TABLET | Freq: Every morning | ORAL | 0 refills | Status: DC

## 2016-09-19 NOTE — Progress Notes (Signed)
Patient ID: Julie Powell, female   DOB: 1961-10-29, 55 y.o.   MRN: [id number] [place] Follow-up Outpatient Visit  Julie Powell 1961/09/16   Date: 09/19/2016  Chief Complaint  Patient presents with  . Follow-up    HPI Comments: Julie Powell is a 55  Y/O woman with a past psychiatric history Major Depressive Disorder, severe, recurrent. Mood disorder due to [facility] and Social Phobia.  The patient is referred for psychiatric services for  medication management.   Bipolar: less mood swings. Labs checked. TSH< lithium level reviewed. Tsh normal. Lithium level low but dose is low as well.  Remains concern of her obesity as she has fluctuation of weight despite Gastric bypass in 2002. She avoid family says what would they say about my weight. . Severity: Depression: 6/10 (0=Very depressed; 5=Neutral; 10=Very Happy)-  Anxiety- 5/10 (0=no anxiety; 5= moderate/tolerable anxiety; 10= panic attacks). Not worsened Alcohol use: now have cut down to wine and not beer . campral helping but still needs to work on lowering and abstinence. She feels more alert since alcohol use is less then before.   . Duration: Since age 55, had a suicide attempt at age 54.   . Timing: Worse in the evenings   . Context: She perceives herself with low self esteem.   . Modifying factors: medications, her son  . Associated signs and symptoms: No delusions hallucinations or manic symptoms   Traumatic Brain Injury: No none   Review of Systems  Constitutional: Negative for fever.  Cardiovascular: Negative for chest pain and palpitations.  Skin: Negative for itching.  Neurological: Negative for tremors and headaches.  Psychiatric/Behavioral: Positive for substance abuse. Negative for suicidal ideas.   Vitals:   09/19/16 1031  BP: 118/70  BP Location: Right Arm  Patient Position: Sitting  Cuff Size: Normal  Pulse: 92  Resp: 14  SpO2: 97%  Weight: 265 lb (120.2 kg)  Height: 5\' 4"  (1.626 m)    Physical Exam  Constitutional: She appears well-developed and well-nourished. No distress.  Skin: She is not diaphoretic.  No tremors of hands noted while patient was interviewed.  Musculoskeletal: Gait & Station: normal Patient leans: N/A   Past Psychiatric History: Reviewed   Diagnosis: Major Depressive Disorder, recurrent, severe   Hospitalizations: Several since age 10   Outpatient Care: Since   Substance Abuse Care: none   Self-Mutilation: Patient denies   Suicidal Attempts: Three attempts   Violent Behaviors: Patient denies.    Past Medical History: Reviewed  Past Medical History:  Diagnosis Date  . Anxiety   . Asthma   . B12 deficiency   . Bipolar 1 disorder ([place]) 2010   ect  . Fibroids   . Hypertension   . Plantar fasciitis 2013  . Sciatica    RT hip  . Vitamin D deficiency    History of Loss of Consciousness: No  Seizure History: No  Cardiac History: Yes-HTN  Allergies: No Known Allergies   Current Medications: Reviewed   Current Outpatient Prescriptions on File Prior to Visit  Medication Sig Dispense Refill  . cholecalciferol (VITAMIN D) 1000 UNITS tablet Take 5,000 Units by mouth daily.    . Cyanocobalamin 2500 MCG SUBL Place 1 tablet (2,500 mcg total) under the tongue 3 (three) times a week.  0  . Flaxseed, Linseed, (FLAX SEED OIL PO) Take by mouth.    . hyoscyamine (LEVBID) 0.375 MG 12 hr tablet Take 0.375 mg by mouth daily.  0  . lisinopril (PRINIVIL,ZESTRIL) 10 MG  tablet Take 1 tablet (10 mg total) by mouth daily. 90 tablet 1  . Multiple Vitamin (MULTIVITAMIN) tablet Take 1 tablet by mouth daily.    Marland Kitchen PROAIR HFA 108 (90 Base) MCG/ACT inhaler inhale 2 puffs by mouth every 6 hours if needed 25.5 Inhaler 1   No current facility-administered medications on file prior to visit.       Family History: Reviewed  Family History   Problem  Relation  Age of Onset   .  Adopted: Yes   .  Aneurysm  Father     Psychiatric Specialty Exam:  Objective:  Appearance: Well Groomed   Eye Contact:: Good   Speech: Clear and Coherent and Normal Rate   Volume: Normal   Mood: less dysphoric  Affect: Appropriate, Congruent and Restricted   Thought Process: Goal Directed, Logical and Loose   Orientation: Full   Thought Content: WDL   Suicidal Thoughts: Patient denies   Homicidal Thoughts: Patient denies   Judgement: Fair   Insight: Fair   Psychomotor Activity: Normal   Akathisia: No   Handed: Right   Memory: Immediate 3/3, Recent: 1/3   [place] of knowledge-average to above average.  AIMS (if indicated): Not indicated at this time.   Assets: Communication Skills  Desire for Improvement    Assessment:  AXIS I: Major Depressive Disorder, severe, recurrent-- not worsen Social Phobia-slightly improving. Mood symptoms due to GMC/back problems/obesity Has    Treatment Plan/Recommendations:   Plan of Care:  PLAN:  1. Affirm with the patient that the medications are taken as ordered. Patient  expressed understanding of how their medications were to be used.    Laboratory:  reveiwed  Psychotherapy: Therapy: brief supportive therapy provided. Discussed psychosocial stressors. Again, encouraged patient to continue spending time daily making changes to 4 important areas of her life. More than 50% of the visit was spent on individual therapy.   Medications:  Continue the following psychiatric medications as written prior to this appointment with the following changes::  Refills sent for  wellbutrin 90 day supply.  Bipolar depression: continue lithium. meds sent Anxiety: cotninue paxil . Refill sent  Alcohol use: continue camprall but more regularly as she skips doses.  No side effects  Advised to avoid alcohol intake, further and stop.  Medications keep some balance. Understands today that therapy may help low self esteem and will refer. Work in therapy -Risks and benefts, side effects and alternatives discussed with patient,  she was given an opportunity to ask questions about her medication, illness, and treatment. All current psychiatric medications have been reviewed and discussed with the patient and adjusted as clinically appropriate. The patient has been provided an accurate and updated list of the medications being now prescribed.     Follow up in 2 months or earlier if needed.      Time Spent: 25 minutes Merian Capron, M.D.  09/19/2016 11:37 AM

## 2016-09-26 ENCOUNTER — Ambulatory Visit: Payer: Self-pay | Admitting: Family

## 2016-10-17 ENCOUNTER — Ambulatory Visit (INDEPENDENT_AMBULATORY_CARE_PROVIDER_SITE_OTHER): Payer: Medicare Other | Admitting: *Deleted

## 2016-10-17 ENCOUNTER — Encounter: Payer: Self-pay | Admitting: *Deleted

## 2016-10-17 VITALS — BP 116/82 | HR 77 | Resp 16 | Ht 64.0 in | Wt 266.2 lb

## 2016-10-17 DIAGNOSIS — Z0000 Encounter for general adult medical examination without abnormal findings: Secondary | ICD-10-CM | POA: Diagnosis not present

## 2016-10-17 DIAGNOSIS — J45909 Unspecified asthma, uncomplicated: Secondary | ICD-10-CM

## 2016-10-17 DIAGNOSIS — F332 Major depressive disorder, recurrent severe without psychotic features: Secondary | ICD-10-CM | POA: Diagnosis not present

## 2016-10-17 DIAGNOSIS — E785 Hyperlipidemia, unspecified: Secondary | ICD-10-CM

## 2016-10-17 NOTE — Assessment & Plan Note (Signed)
No recent flares per pt. She is using albuterol inhaler once weekly or less. Ongoing management per PCP.

## 2016-10-17 NOTE — Progress Notes (Signed)
Noted and agree. 

## 2016-10-17 NOTE — Progress Notes (Signed)
Pre visit review using our clinic review tool, if applicable. No additional management support is needed unless otherwise documented below in the visit note. 

## 2016-10-17 NOTE — Patient Instructions (Signed)
  Julie Powell , Thank you for taking time to come for your Medicare Wellness Visit. I appreciate your ongoing commitment to your health goals. Please review the following plan we discussed and let me know if I can assist you in the future.   These are the goals we discussed: Goals    . Increase physical activity    . Increase water intake    . Reduce alcohol intake       This is a list of the screening recommended for you and due dates:  Health Maintenance  Topic Date Due  .  Hepatitis C: One time screening is recommended by Center for Disease Control  (CDC) for  adults born from [birth year] through 1963.   03/21/2017*  . HIV Screening  03/21/2017*  . DEXA scan (bone density measurement)  02/17/2017  . Mammogram  01/31/2018  . Pap Smear  04/27/2019  . Colon Cancer Screening  11/08/2023  . Tetanus Vaccine  11/13/2024  . Flu Shot  Completed  *Topic was postponed. The date shown is not the original due date.

## 2016-10-17 NOTE — Assessment & Plan Note (Signed)
Pt reports symptoms are stable on current meds. Ongoing management per psychiatry.

## 2016-10-17 NOTE — Assessment & Plan Note (Signed)
Cholesterol elevated, not currently on medication. Encouraged healthy diet, regular exercise, and weight loss per PCP recommendations at time of last labs.

## 2016-10-17 NOTE — Progress Notes (Signed)
Subjective:   Julie Powell is a 55 y.o. female who presents for Medicare Annual (Subsequent) preventive examination.  She reports she feels well today. She has stopped drinking liquor and is now drinking about 3 glasses of wine nightly. She is trying to continue to cut back on alcohol.  Review of Systems:  No ROS.  Medicare Wellness Visit.  Cardiac Risk Factors include: hypertension;obesity (BMI >30kg/m2);sedentary lifestyle  Sleep patterns: no sleep issues and sleeps through the night. 8 hours nightly.  Home Safety/Smoke Alarms: Feels safe in home. Smoke alarms in place.   Living environment; residence and Firearm Safety: apartment, can live on one level. Lives w/ 77 y/o son and her dog. Son has firearm, pt does not know where it is stored. Seat Belt Safety/Bike Helmet: [employer] seat belt.   Counseling:   Eye Exam- Follows w/ [facility] every 2 years. Wearing glasses today. Dental- Dr. Tandy Gaw every 6 months  Female:   Pap- last 04/26/16, normal      Mammo- last 02/01/16, BI-RADS CATEGORY 1: NEGATIVE       Dexa scan- last 02/18/15, normal       CCS- last 10/08/13 w/ [facility]     Objective:     Vitals: BP 116/82 (BP Location: Left Arm, Patient Position: Sitting, Cuff Size: Normal)   Pulse 77   Resp 16   Ht 5\' 4"  (1.626 m)   Wt 266 lb 3.2 oz (120.7 kg)   LMP 09/11/2013   SpO2 91%   BMI 45.69 kg/m   Body mass index is 45.69 kg/m.   Tobacco History  Smoking Status  . Former Smoker  . Packs/day: 0.50  . Years: 10.00  . Types: Cigarettes  . Quit date: 11/07/1991  Smokeless Tobacco  . Never Used     Counseling given: Not Answered   Past Medical History:  Diagnosis Date  . Anxiety   . Asthma   . B12 deficiency   . Bipolar 1 disorder ([place]) 2010   ect  . Fibroids   . Hypertension   . Plantar fasciitis 2013  . Sciatica    RT hip  . Vitamin D deficiency    Past Surgical History:  Procedure Laterality Date  . CESAREAN SECTION  1995  .  CYSTECTOMY  1998   "thyroglycol duct cyst in neck" had dysphagia, was told benign  . GASTRIC BYPASS  2003  . MICRODISCECTOMY LUMBAR  09/11/14   L3-L4  Dr Saintclair Halsted  . PILONIDAL CYST DRAINAGE  1982  . SP CHOLECYSTOMY  1986   Family History  Problem Relation Age of Onset  . Adopted: Yes  . Aneurysm Father   . Alcohol abuse Father   . Alcohol abuse Brother   . Alcohol abuse Brother   . Cancer Brother     prostate  . Alcohol abuse Brother   . Lung cancer Sister   . Cancer Sister     lung  . Thyroid disease Neg Hx   . Diabetes Neg Hx   . Hypertension Neg Hx    History  Sexual Activity  . Sexual activity: No    Outpatient Encounter Prescriptions as of 10/17/2016  Medication Sig  . buPROPion (WELLBUTRIN) 100 MG tablet Take 2 tablets (200 mg total) by mouth 2 (two) times daily.  . cholecalciferol (VITAMIN D) 1000 UNITS tablet Take 5,000 Units by mouth daily.  . Cyanocobalamin 2500 MCG SUBL Place 1 tablet (2,500 mcg total) under the tongue 3 (three) times a week.  Marland Kitchen  Flaxseed, Linseed, (FLAX SEED OIL PO) Take by mouth.  . hyoscyamine (LEVBID) 0.375 MG 12 hr tablet Take 0.375 mg by mouth daily.  Marland Kitchen lisinopril (PRINIVIL,ZESTRIL) 10 MG tablet Take 1 tablet (10 mg total) by mouth daily.  Marland Kitchen lithium carbonate (LITHOBID) 300 MG CR tablet Take 1 tablet (300 mg total) by mouth 2 (two) times daily.  . Multiple Vitamin (MULTIVITAMIN) tablet Take 1 tablet by mouth daily.  Marland Kitchen PARoxetine (PAXIL) 30 MG tablet Take 1 tablet (30 mg total) by mouth every morning.  Marland Kitchen PROAIR HFA 108 (90 Base) MCG/ACT inhaler inhale 2 puffs by mouth every 6 hours if needed   No facility-administered encounter medications on file as of 10/17/2016.     Activities of Daily Living In your present state of health, do you have any difficulty performing the following activities: 10/17/2016  Hearing? N  Vision? N  Difficulty concentrating or making decisions? N  Walking or climbing stairs? Y  Dressing or bathing? N  Doing  errands, shopping? N  Preparing Food and eating ? N  Using the Toilet? N  In the past six months, have you accidently leaked urine? N  Do you have problems with loss of bowel control? N  Managing your Medications? N  Managing your Finances? N  Housekeeping or managing your Housekeeping? N  Some recent data might be hidden    Patient Care Team: [name], NP as PCP - General (Internal Medicine) Merian Capron, MD as Consulting Physician (Psychiatry) Renato Shin, MD as Consulting Physician (Endocrinology) Lucienne Capers, MD as Consulting Physician (Gastroenterology)    Assessment:    Physical assessment deferred to PCP.  Exercise Activities and Dietary recommendations Current Exercise Habits: Home exercise routine, Type of exercise: walking, Time (Minutes): 30  Diet (meal preparation, eat out, water intake, caffeinated beverages, dairy products, fruits and vegetables): in general, a "healthy" diet  , well balanced. Able to prepare own meals. Does not like fruit. Does not drink much water but has started drinking water w/ lemon recently.  Breakfast: over easy egg on toast w/ margarine and glass of water. Sometimes bacon w/ breakfast.  Lunch: Varies, likes to make salad. Sometimes soup. Dinner: Varies. Usually meat and vegetables. Sometimes pasta.  Goals    . Increase physical activity    . Increase water intake    . Reduce alcohol intake      Fall Risk Fall Risk  10/17/2016 03/21/2016  Falls in the past year? No No   Depression Screen PHQ 2/9 Scores 10/17/2016 03/21/2016  PHQ - 2 Score 2 6  PHQ- 9 Score 5 18     Cognitive Function MMSE - Mini Mental State Exam 10/17/2016  Orientation to time 5  Orientation to Place 5  Registration 3  Attention/ Calculation 5  Recall 2  Language- name 2 objects 2  Language- repeat 1  Language- follow 3 step command 3  Language- read & follow direction 1  Write a sentence 1  Copy design 1  Total score 29         Immunization History  Administered Date(s) Administered  . Influenza,inj,Quad PF,36+ Mos 08/12/2014, 09/22/2015, 09/13/2016  . Pneumococcal Polysaccharide-23 08/29/2011  . Tdap 11/13/2014   Screening Tests Health Maintenance  Topic Date Due  . Hepatitis C Screening  03/21/2017 (Originally 1961/11/01)  . HIV Screening  03/21/2017 (Originally 08/20/1978)  . DEXA SCAN  02/17/2017  . MAMMOGRAM  01/31/2018  . PAP SMEAR  04/27/2019  . COLONOSCOPY  11/08/2023  . TETANUS/TDAP  11/13/2024  .  INFLUENZA VACCINE  Completed      Plan:    Follow-up w/ [name], NP as scheduled. Hepatitis C and HIV screening w/ next labs.  During the course of the visit the patient was educated and counseled about the following appropriate screening and preventive services:   Vaccines to include Pneumoccal, Influenza, Hepatitis B, Td, Zostavax, HCV  Cardiovascular Disease  Colorectal cancer screening  Bone density screening  Diabetes screening  Glaucoma screening  Mammography/PAP  Nutrition counseling   Patient Instructions (the written plan) was given to the patient.   Dorrene German, RN  10/17/2016

## 2016-11-14 ENCOUNTER — Encounter (HOSPITAL_COMMUNITY): Payer: Self-pay | Admitting: Psychiatry

## 2016-11-14 ENCOUNTER — Ambulatory Visit (INDEPENDENT_AMBULATORY_CARE_PROVIDER_SITE_OTHER): Payer: Medicare Other | Admitting: Psychiatry

## 2016-11-14 VITALS — BP 124/72 | HR 84 | Resp 16 | Ht 64.0 in | Wt 266.0 lb

## 2016-11-14 DIAGNOSIS — F332 Major depressive disorder, recurrent severe without psychotic features: Secondary | ICD-10-CM

## 2016-11-14 DIAGNOSIS — F5102 Adjustment insomnia: Secondary | ICD-10-CM

## 2016-11-14 DIAGNOSIS — F4010 Social phobia, unspecified: Secondary | ICD-10-CM | POA: Diagnosis not present

## 2016-11-14 DIAGNOSIS — F3130 Bipolar disorder, current episode depressed, mild or moderate severity, unspecified: Secondary | ICD-10-CM | POA: Diagnosis not present

## 2016-11-14 DIAGNOSIS — Z79899 Other long term (current) drug therapy: Secondary | ICD-10-CM | POA: Diagnosis not present

## 2016-11-14 MED ORDER — PAROXETINE HCL 30 MG PO TABS: 30.0000 mg | ORAL_TABLET | Freq: Every morning | ORAL | 0 refills | Status: DC

## 2016-11-14 MED ORDER — BUPROPION HCL 100 MG PO TABS: 200.0000 mg | ORAL_TABLET | Freq: Two times a day (BID) | ORAL | 0 refills | Status: DC

## 2016-11-14 MED ORDER — LITHIUM CARBONATE ER 300 MG PO TBCR: 300.0000 mg | EXTENDED_RELEASE_TABLET | Freq: Two times a day (BID) | ORAL | 0 refills | Status: DC

## 2016-11-14 NOTE — Progress Notes (Signed)
Patient ID: Julie Powell, female   DOB: 02-06-1961, 56 y.o.   MRN: [id number] [place] Follow-up Outpatient Visit  Julie Powell 01/08/1961   Date: 09/19/2016  Chief Complaint  Patient presents with  . Follow-up    HPI Comments: Julie Powell is a 56  Y/O woman with a past psychiatric history Major Depressive Disorder, severe, recurrent. Mood disorder due to [facility] and Social Phobia.  The patient is referred for psychiatric services for  medication management.   Patient is not worse and she can just take lithium for bipolar she does have some psychosocial issues related with her son who does not listen to her and mostly stays outside or at work Anxiety fluctuates not worsened Depression fluctuates also depending upon the social stress she still has social anxiety and she keeps to herself that affects her mood as she has to spend most of time at home Lithium level rechecked last visit there below normal but her lithium level and lithium doses also low  Alcohol use: now have cut down to wine and not beer . campral helping but still needs to work on lowering and abstinence. She feels more alert since alcohol use is less then before.   . Duration: Since age 56, had a suicide attempt at age 56.   . Timing: Worse in the evenings   . Context: She perceives herself with low self esteem.   . Modifying factors: medications, her son  . Associated signs and symptoms: No delusions hallucinations or manic symptoms   Traumatic Brain Injury: No none   Review of Systems  Constitutional: Negative for fever.  Cardiovascular: Negative for chest pain.  Gastrointestinal: Negative for nausea.  Skin: Negative for itching.  Neurological: Negative for tremors and headaches.  Psychiatric/Behavioral: Negative for depression, substance abuse and suicidal ideas.   Vitals:   11/14/16 1034  BP: 124/72  BP Location: Right Arm  Patient Position: Sitting  Cuff Size: Normal  Pulse: 84  Resp:  16  SpO2: 94%  Weight: 266 lb (120.7 kg)  Height: 5\' 4"  (1.626 m)   Physical Exam  Constitutional: She appears well-developed and well-nourished. No distress.  Skin: She is not diaphoretic.  No tremors of hands noted while patient was interviewed.     Past Medical History: Reviewed  Past Medical History:  Diagnosis Date  . Anxiety   . Asthma   . B12 deficiency   . Bipolar 1 disorder ([place]) 2010   ect  . Fibroids   . Hypertension   . Plantar fasciitis 2013  . Sciatica    RT hip  . Vitamin D deficiency     Allergies: No Known Allergies   Current Medications: Reviewed   Current Outpatient Prescriptions on File Prior to Visit  Medication Sig Dispense Refill  . cholecalciferol (VITAMIN D) 1000 UNITS tablet Take 5,000 Units by mouth daily.    . Cyanocobalamin 2500 MCG SUBL Place 1 tablet (2,500 mcg total) under the tongue 3 (three) times a week.  0  . Flaxseed, Linseed, (FLAX SEED OIL PO) Take by mouth.    . hyoscyamine (LEVBID) 0.375 MG 12 hr tablet Take 0.375 mg by mouth daily.  0  . lisinopril (PRINIVIL,ZESTRIL) 10 MG tablet Take 1 tablet (10 mg total) by mouth daily. 90 tablet 1  . Multiple Vitamin (MULTIVITAMIN) tablet Take 1 tablet by mouth daily.    Marland Kitchen PROAIR HFA 108 (90 Base) MCG/ACT inhaler inhale 2 puffs by mouth every 6 hours if needed 25.5 Inhaler 1  No current facility-administered medications on file prior to visit.       Family History: Reviewed  Family History   Problem  Relation  Age of Onset   .  Adopted: Yes   .  Aneurysm  Father     Psychiatric Specialty Exam:  Objective: Appearance: Well Groomed   Eye Contact:: Good   Speech: Clear and Coherent and Normal Rate   Volume: Normal   Mood: somewhat dysphoric but not hopeless.   Affect: Appropriate, Congruent and Restricted   Thought Process: Goal Directed, Logical and Loose   Orientation: Full   Thought Content: WDL   Suicidal Thoughts: Patient denies   Homicidal Thoughts: Patient denies    Judgement: Fair   Insight: Fair   Psychomotor Activity: Normal   Akathisia: No   Handed: Right   Memory: Immediate 3/3, Recent: 1/3   [place] of knowledge-average to above average.  AIMS (if indicated): Not indicated at this time.   Assets: Communication Skills  Desire for Improvement    Assessment:  AXIS I: Major Depressive Disorder, severe, recurrent-- not worsen Social Phobia-slightly improving. Mood symptoms due to GMC/back problems/obesity Has    Treatment Plan/Recommendations:   Plan of Care:  Provided supportive therapy. Reviewed side effects . Sent prescriptions.       .   Medications:  Continue medications with no change::  Refills sent for  wellbutrin 90 day supply.  Bipolar depression: continue lithium. meds sent Anxiety: cotninue paxil . Refill sent Depression: not worsened. Continue wellbutrin 200mg  bid Alcohol use: relapse prevention reviewed No side effects      Follow up in 2-3 months or earlier if needed.       Merian Capron, M.D.  11/14/2016 10:44 AM

## 2016-12-12 ENCOUNTER — Encounter: Payer: Self-pay | Admitting: Family

## 2016-12-12 ENCOUNTER — Ambulatory Visit (INDEPENDENT_AMBULATORY_CARE_PROVIDER_SITE_OTHER): Payer: Medicare Other | Admitting: Family

## 2016-12-12 VITALS — BP 116/70 | HR 84 | Temp 98.4°F | Resp 16 | Ht 64.0 in | Wt 269.4 lb

## 2016-12-12 DIAGNOSIS — I10 Essential (primary) hypertension: Secondary | ICD-10-CM | POA: Diagnosis not present

## 2016-12-12 DIAGNOSIS — E669 Obesity, unspecified: Secondary | ICD-10-CM

## 2016-12-12 DIAGNOSIS — F332 Major depressive disorder, recurrent severe without psychotic features: Secondary | ICD-10-CM | POA: Diagnosis not present

## 2016-12-12 DIAGNOSIS — L819 Disorder of pigmentation, unspecified: Secondary | ICD-10-CM

## 2016-12-12 DIAGNOSIS — F1011 Alcohol abuse, in remission: Secondary | ICD-10-CM | POA: Insufficient documentation

## 2016-12-12 DIAGNOSIS — F1010 Alcohol abuse, uncomplicated: Secondary | ICD-10-CM | POA: Insufficient documentation

## 2016-12-12 NOTE — Progress Notes (Signed)
Subjective:    Patient ID: Julie Powell, female    DOB: Aug 29, 1961, 56 y.o.   MRN: [id number]  HPI  Julie Powell is a 56 yr old female who presents today for follow up.  1) Depression- followed by Dr. Ulice Brilliant (psychiatry). Currently maintained on wellbutrin and paxil.  Reports doing fair.   2) HTN- on lisinopril.   BP Readings from Last 3 Encounters:  12/12/16 116/70  10/17/16 116/82  09/13/16 124/85   3) Obesity- still drinking up to 2 bottles of wine a night.   Reports that she cooks fried chicken and spaghetti with meatsauce.   Wt Readings from Last 3 Encounters:  12/12/16 269 lb 6.4 oz (122.2 kg)  10/17/16 266 lb 3.2 oz (120.7 kg)  09/13/16 265 lb 3.2 oz (120.3 kg)   4) skin discoloration- notes hyperpigmentation above lip, covers with makeup   Review of Systems See HPI  Past Medical History:  Diagnosis Date  . Anxiety   . Asthma   . B12 deficiency   . Bipolar 1 disorder ([place]) 2010   ect  . Fibroids   . Hypertension   . Plantar fasciitis 2013  . Sciatica    RT hip  . Vitamin D deficiency      Social History   Social History  . Marital status: Divorced    Spouse name: N/A  . Number of children: N/A  . Years of education: N/A   Occupational History  . Not on file.   Social History Main Topics  . Smoking status: Former Smoker    Packs/day: 0.50    Years: 10.00    Types: Cigarettes    Quit date: 11/07/1991  . Smokeless tobacco: Never Used  . Alcohol use 12.6 oz/week    21 Glasses of wine per week  . Drug use: No     Comment: Caffeine- Carbonated beverage 16 ounces.  . Sexual activity: No   Other Topics Concern  . 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   Enjoys television    Past  Surgical History:  Procedure Laterality Date  . CESAREAN SECTION  1995  . CYSTECTOMY  1998   "thyroglycol duct cyst in neck" had dysphagia, was told benign  . GASTRIC BYPASS  2003  . MICRODISCECTOMY LUMBAR  09/11/14   L3-L4  Dr Saintclair Halsted  . PILONIDAL CYST DRAINAGE  1982  . SP CHOLECYSTOMY  1986    Family History  Problem Relation Age of Onset  . Adopted: Yes  . Aneurysm Father   . Alcohol abuse Father   . Alcohol abuse Brother   . Alcohol abuse Brother   . Cancer Brother     prostate  . Alcohol abuse Brother   . Lung cancer Sister   . Cancer Sister     lung  . Thyroid disease Neg Hx   . Diabetes Neg Hx   . Hypertension Neg Hx     No Known Allergies  Current Outpatient Prescriptions on File Prior to Visit  Medication Sig Dispense Refill  . buPROPion (WELLBUTRIN) 100 MG tablet Take 2 tablets (200 mg total) by mouth 2 (two) times daily. 360 tablet 0  . cholecalciferol (VITAMIN D) 1000 UNITS tablet  Take 5,000 Units by mouth daily.    . Cyanocobalamin 2500 MCG SUBL Place 1 tablet (2,500 mcg total) under the tongue 3 (three) times a week.  0  . Flaxseed, Linseed, (FLAX SEED OIL PO) Take by mouth.    . hyoscyamine (LEVBID) 0.375 MG 12 hr tablet Take 0.375 mg by mouth daily.  0  . lisinopril (PRINIVIL,ZESTRIL) 10 MG tablet Take 1 tablet (10 mg total) by mouth daily. 90 tablet 1  . lithium carbonate (LITHOBID) 300 MG CR tablet Take 1 tablet (300 mg total) by mouth 2 (two) times daily. 180 tablet 0  . Multiple Vitamin (MULTIVITAMIN) tablet Take 1 tablet by mouth daily.    Marland Kitchen PARoxetine (PAXIL) 30 MG tablet Take 1 tablet (30 mg total) by mouth every morning. 90 tablet 0  . PROAIR HFA 108 (90 Base) MCG/ACT inhaler inhale 2 puffs by mouth every 6 hours if needed 25.5 Inhaler 1   No current facility-administered medications on file prior to visit.     BP 116/70 (BP Location: Right Arm, Cuff Size: Large)   Pulse 84   Temp 98.4 F (36.9 C) (Oral)   Resp 16   Ht 5\' 4"  (1.626 m)   Wt 269  lb 6.4 oz (122.2 kg)   LMP 09/11/2013   SpO2 100% Comment: room air  BMI 46.24 kg/m       Objective:   Physical Exam  Constitutional: She is oriented to person, place, and time. She appears well-developed and well-nourished.  HENT:  Head: Normocephalic and atraumatic.  Cardiovascular: Normal rate, regular rhythm and normal heart sounds.   No murmur heard. Pulmonary/Chest: Effort normal and breath sounds normal. No respiratory distress. She has no wheezes.  Musculoskeletal: She exhibits no edema.  Neurological: She is alert and oriented to person, place, and time.  Psychiatric: She has a normal mood and affect. Her behavior is normal. Judgment and thought content normal.  Skin: mild hyperpigmentation noted above lip- but has heavy makeup covering        Assessment & Plan:  Skin discoloration- will refer to dermatology for further evaluation.

## 2016-12-12 NOTE — Assessment & Plan Note (Signed)
Discussed importance of cutting back on drinking. Advised pt to consider attending an [facility].

## 2016-12-12 NOTE — Assessment & Plan Note (Signed)
Fair control. Management per psychiatry.  

## 2016-12-12 NOTE — Patient Instructions (Addendum)
Consider joining AA.  Follow back up with Dr. Nicole Cella to discuss medications to help you quit drinking.

## 2016-12-12 NOTE — Assessment & Plan Note (Signed)
Stable on lisinopril, continue same.  

## 2016-12-12 NOTE — Assessment & Plan Note (Signed)
Weight gain likely related to poor diet and high calorie intake with alcohol consumption.

## 2016-12-12 NOTE — Progress Notes (Signed)
Pre visit review using our clinic review tool, if applicable. No additional management support is needed unless otherwise documented below in the visit note. 

## 2016-12-14 ENCOUNTER — Encounter: Payer: Self-pay | Admitting: Family

## 2016-12-20 ENCOUNTER — Telehealth: Payer: Self-pay | Admitting: Family

## 2016-12-20 NOTE — Telephone Encounter (Signed)
As per patient send medication only to  [facility], [place] [phone] (Phone) [phone] (Fax)   Due to insurance

## 2016-12-21 NOTE — Telephone Encounter (Signed)
Pharmacy updated.

## 2017-01-23 ENCOUNTER — Ambulatory Visit (HOSPITAL_COMMUNITY): Payer: Self-pay | Admitting: Psychiatry

## 2017-02-01 ENCOUNTER — Ambulatory Visit (INDEPENDENT_AMBULATORY_CARE_PROVIDER_SITE_OTHER): Payer: Medicare Other | Admitting: Psychiatry

## 2017-02-01 ENCOUNTER — Encounter (HOSPITAL_COMMUNITY): Payer: Self-pay | Admitting: Psychiatry

## 2017-02-01 VITALS — BP 122/76 | HR 76 | Resp 16 | Ht 64.0 in | Wt 266.0 lb

## 2017-02-01 DIAGNOSIS — F3130 Bipolar disorder, current episode depressed, mild or moderate severity, unspecified: Secondary | ICD-10-CM | POA: Diagnosis not present

## 2017-02-01 DIAGNOSIS — F5102 Adjustment insomnia: Secondary | ICD-10-CM

## 2017-02-01 DIAGNOSIS — F4010 Social phobia, unspecified: Secondary | ICD-10-CM | POA: Diagnosis not present

## 2017-02-01 DIAGNOSIS — F332 Major depressive disorder, recurrent severe without psychotic features: Secondary | ICD-10-CM | POA: Diagnosis not present

## 2017-02-01 DIAGNOSIS — Z79899 Other long term (current) drug therapy: Secondary | ICD-10-CM | POA: Diagnosis not present

## 2017-02-01 LAB — HEPATIC FUNCTION PANEL
ALT: 31 U/L — ABNORMAL HIGH (ref 6–29)
AST: 43 U/L — ABNORMAL HIGH (ref 10–35)
Albumin: 4.1 g/dL (ref 3.6–5.1)
Alkaline Phosphatase: 137 U/L — ABNORMAL HIGH (ref 33–130)
Bilirubin, Direct: 0.1 mg/dL (ref <=0.2)
Indirect Bilirubin: 0.3 mg/dL (ref 0.2–1.2)
Total Bilirubin: 0.4 mg/dL (ref 0.2–1.2)
Total Protein: 7.1 g/dL (ref 6.1–8.1)

## 2017-02-01 LAB — BASIC METABOLIC PANEL WITH GFR
BUN: 11 mg/dL (ref 7–25)
CO2: 25 mmol/L (ref 20–31)
Calcium: 10 mg/dL (ref 8.6–10.4)
Chloride: 103 mmol/L (ref 98–110)
Creat: 0.76 mg/dL (ref 0.50–1.05)
Glucose, Bld: 84 mg/dL (ref 65–99)
Potassium: 4.8 mmol/L (ref 3.5–5.3)
Sodium: 139 mmol/L (ref 135–146)

## 2017-02-01 MED ORDER — LITHIUM CARBONATE ER 300 MG PO TBCR: 300.0000 mg | EXTENDED_RELEASE_TABLET | Freq: Two times a day (BID) | ORAL | 0 refills | Status: DC

## 2017-02-01 MED ORDER — ACAMPROSATE CALCIUM 333 MG PO TBEC: 666.0000 mg | DELAYED_RELEASE_TABLET | Freq: Two times a day (BID) | ORAL | 0 refills | Status: DC

## 2017-02-01 MED ORDER — BUPROPION HCL 100 MG PO TABS: 200.0000 mg | ORAL_TABLET | Freq: Two times a day (BID) | ORAL | 0 refills | Status: DC

## 2017-02-01 MED ORDER — PAROXETINE HCL 30 MG PO TABS: 30.0000 mg | ORAL_TABLET | Freq: Every morning | ORAL | 0 refills | Status: DC

## 2017-02-01 NOTE — Progress Notes (Signed)
Patient ID: Julie Powell, female   DOB: 1961/03/10, 56 y.o.   MRN: [id number] [facility] Follow-up Outpatient Visit  Julie Powell 1961/05/22   Date: 02/01/2017 Chief Complaint  Patient presents with  . Follow-up    HPI Comments: Julie Powell is a 56  Y/O woman with a past psychiatric history Major Depressive Disorder, severe, recurrent. Mood disorder due to [facility] and Social Phobia.  The patient is referred for psychiatric services for  medication management.   Patient is feeling somewhat down states she is pretty much the same and has not improved much but apparently she is drinking acknowledges drinking 1-2 bottles of wine before that she was drinking liquor. We talked in detail about abstinence and other supportive services. Mood wise she is not worse or improved she is taking lithium is tolerating medication anxiety fluctuates depending on the stress level she does of her son who at times can be stressful.   . Duration: Since age 56, had a suicide attempt at age 24.   . Timing: Worse in the evenings   . Context: alcohol use. Low self esteem  . Modifying factors: medications, her son  . Associated signs and symptoms: No delusions hallucinations or manic symptoms   Traumatic Brain Injury: No none   Review of Systems  Constitutional: Negative for fever.  Cardiovascular: Negative for palpitations.  Gastrointestinal: Negative for nausea.  Skin: Negative for itching.  Neurological: Negative for tremors and headaches.  Psychiatric/Behavioral: Positive for depression. Negative for substance abuse and suicidal ideas.   Vitals:   02/01/17 1100  BP: 122/76  Pulse: 76  Resp: 16  SpO2: 95%  Weight: 266 lb (120.7 kg)  Height: 5\' 4"  (1.626 m)   Physical Exam  Constitutional: She appears well-developed and well-nourished. No distress.  Skin: She is not diaphoretic.  No tremors of hands noted while patient was interviewed.     Past Medical History: Reviewed  Past  Medical History:  Diagnosis Date  . Anxiety   . Asthma   . B12 deficiency   . Bipolar 1 disorder ([place]) 2010   ect  . Fibroids   . Hypertension   . Plantar fasciitis 2013  . Sciatica    RT hip  . Vitamin D deficiency     Allergies: No Known Allergies   Current Medications: Reviewed   Current Outpatient Prescriptions on File Prior to Visit  Medication Sig Dispense Refill  . cholecalciferol (VITAMIN D) 1000 UNITS tablet Take 5,000 Units by mouth daily.    . Cyanocobalamin 2500 MCG SUBL Place 1 tablet (2,500 mcg total) under the tongue 3 (three) times a week.  0  . Flaxseed, Linseed, (FLAX SEED OIL PO) Take by mouth.    . hyoscyamine (LEVBID) 0.375 MG 12 hr tablet Take 0.375 mg by mouth daily.  0  . lisinopril (PRINIVIL,ZESTRIL) 10 MG tablet Take 1 tablet (10 mg total) by mouth daily. 90 tablet 1  . Multiple Vitamin (MULTIVITAMIN) tablet Take 1 tablet by mouth daily.    Marland Kitchen PROAIR HFA 108 (90 Base) MCG/ACT inhaler inhale 2 puffs by mouth every 6 hours if needed 25.5 Inhaler 1   No current facility-administered medications on file prior to visit.       Family History: Reviewed  Family History   Problem  Relation  Age of Onset   .  Adopted: Yes   .  Aneurysm  Father     Psychiatric Specialty Exam:  Objective: Appearance: Well Groomed   Eye Contact:: Good  Speech: Clear and Coherent and Normal Rate   Volume: Normal   Mood: somewhat down.   Affect: Appropriate, Congruent and Restricted   Thought Process: Goal Directed, Logical and Loose   Orientation: Full   Thought Content: WDL   Suicidal Thoughts: Patient denies   Homicidal Thoughts: Patient denies   Judgement: Fair   Insight: Fair   Psychomotor Activity: Normal   Akathisia: No   Handed: Right   Memory: Immediate 3/3, Recent: 1/3   [place] of knowledge-average to above average.  AIMS (if indicated): Not indicated at this time.   Assets: Communication Skills  Desire for Improvement    Assessment:   AXIS I: Major Depressive Disorder, ; GAD: insomnia: Alcohol use disorder Treatment Plan/Recommendations:   Plan of Care:  Provided supportive therapy. Reviewed side effects . Sent prescriptions.       .   Medications:  Continue medications with no change::   Bipolar depression: somewhat down. Continue wellbutrin and paxil. Will work on alcohol abstinence. TSH and lithium levels sent to lab  Anxiety: fluctuates. Continue paxil  Depression: somewhat more sad at more days .will work on alcohol abstinence. Start camprral twice a day.  Reviewed side effects and concersn . Questions were addressed.      Follow up in 1-2 months or earlier if needed.       Merian Capron, M.D.  02/01/2017 11:48 AM

## 2017-02-02 LAB — TSH: TSH: 2.78 mIU/L

## 2017-02-02 LAB — LITHIUM LEVEL: Lithium Lvl: 0.5 mmol/L — ABNORMAL LOW (ref 0.6–1.2)

## 2017-02-23 DIAGNOSIS — Z9884 Bariatric surgery status: Secondary | ICD-10-CM | POA: Diagnosis not present

## 2017-02-23 DIAGNOSIS — R1013 Epigastric pain: Secondary | ICD-10-CM | POA: Diagnosis not present

## 2017-02-23 DIAGNOSIS — I10 Essential (primary) hypertension: Secondary | ICD-10-CM | POA: Diagnosis not present

## 2017-03-13 ENCOUNTER — Ambulatory Visit: Payer: Self-pay | Admitting: Family

## 2017-03-20 ENCOUNTER — Ambulatory Visit: Payer: Self-pay | Admitting: Family

## 2017-03-22 ENCOUNTER — Ambulatory Visit (HOSPITAL_COMMUNITY): Payer: Self-pay | Admitting: Psychiatry

## 2017-03-27 ENCOUNTER — Encounter: Payer: Self-pay | Admitting: Family

## 2017-03-27 ENCOUNTER — Ambulatory Visit (INDEPENDENT_AMBULATORY_CARE_PROVIDER_SITE_OTHER): Payer: Medicare Other | Admitting: Family

## 2017-03-27 VITALS — BP 110/64 | HR 74 | Temp 98.3°F | Resp 16 | Ht 64.0 in | Wt 265.2 lb

## 2017-03-27 DIAGNOSIS — Z0000 Encounter for general adult medical examination without abnormal findings: Secondary | ICD-10-CM

## 2017-03-27 DIAGNOSIS — F1010 Alcohol abuse, uncomplicated: Secondary | ICD-10-CM

## 2017-03-27 DIAGNOSIS — Z1159 Encounter for screening for other viral diseases: Secondary | ICD-10-CM | POA: Diagnosis not present

## 2017-03-27 DIAGNOSIS — F332 Major depressive disorder, recurrent severe without psychotic features: Secondary | ICD-10-CM | POA: Diagnosis not present

## 2017-03-27 DIAGNOSIS — E669 Obesity, unspecified: Secondary | ICD-10-CM | POA: Diagnosis not present

## 2017-03-27 DIAGNOSIS — E2839 Other primary ovarian failure: Secondary | ICD-10-CM

## 2017-03-27 DIAGNOSIS — Z114 Encounter for screening for human immunodeficiency virus [HIV]: Secondary | ICD-10-CM | POA: Diagnosis not present

## 2017-03-27 DIAGNOSIS — I10 Essential (primary) hypertension: Secondary | ICD-10-CM | POA: Diagnosis not present

## 2017-03-27 DIAGNOSIS — Z1239 Encounter for other screening for malignant neoplasm of breast: Secondary | ICD-10-CM

## 2017-03-27 MED ORDER — LISINOPRIL 10 MG PO TABS
10.0000 mg | ORAL_TABLET | Freq: Every day | ORAL | 1 refills | Status: DC
Start: 2017-03-27 — End: 2017-09-28

## 2017-03-27 NOTE — Patient Instructions (Signed)
For back pain, you may use tylenol as needed, max 3000 mg in 24 hours.

## 2017-03-27 NOTE — Progress Notes (Signed)
Subjective:    Patient ID: Julie Powell, female    DOB: [birth year], 56 y.o.   MRN: [id number]  HPI  Julie Powell is a 56 yr old female who presents today for follow up.  HTN- maintained on lisinopril.  BP Readings from Last 3 Encounters:  03/27/17 110/64  12/12/16 116/70  10/17/16 116/82   Obesity-  Wt Readings from Last 3 Encounters:  03/27/17 265 lb 3.2 oz (120.3 kg)  12/12/16 269 lb 6.4 oz (122.2 kg)  10/17/16 266 lb 3.2 oz (120.7 kg)   Alcohol abuse- has gone "days without drinking thing at all."  Then goes back.    Back pain- reports hx of ruptured disc 2 yrs ago. Reports intermittent daily low back pain.  Pain is 5/95, worse with certain movements such as standing.  Non-radiating.    Depression- followed by Dr Ulice Brilliant.  Reports depression is about the same, having some situation stress.    Skin discoloration- we referred to Dermatology for further evaluation. She did not follow through with the appointment.  Just prefers to cover with makeup for now.   Review of Systems Past Medical History:  Diagnosis Date  . Anxiety   . Asthma   . B12 deficiency   . Bipolar 1 disorder ([place]) 2010   ect  . Fibroids   . Hypertension   . Plantar fasciitis 2013  . Sciatica    RT hip  . Vitamin D deficiency      Social History   Social History  . Marital status: Divorced    Spouse name: N/A  . Number of children: N/A  . Years of education: N/A   Occupational History  . Not on file.   Social History Main Topics  . Smoking status: Former Smoker    Packs/day: 0.50    Years: 10.00    Types: Cigarettes    Quit date: 11/07/1991  . Smokeless tobacco: Never Used  . Alcohol use 12.6 oz/week    21 Glasses of wine per week  . Drug use: No     Comment: Caffeine- Carbonated beverage 16 ounces.  . Sexual activity: No   Other Topics Concern  . 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   Enjoys television    Past Surgical History:  Procedure Laterality Date  . CESAREAN SECTION  1995  . CYSTECTOMY  1998   "thyroglycol duct cyst in neck" had dysphagia, was told benign  . GASTRIC BYPASS  2003  . MICRODISCECTOMY LUMBAR  09/11/14   L3-L4  Dr Saintclair Halsted  . PILONIDAL CYST DRAINAGE  1982  . SP CHOLECYSTOMY  1986    Family History  Problem Relation Age of Onset  . Adopted: Yes  . Aneurysm Father   . Alcohol abuse Father   . Alcohol abuse Brother   . Alcohol abuse Brother   . Cancer Brother        prostate  . Alcohol abuse Brother   . Lung cancer Sister   . Cancer Sister        lung  . Thyroid disease Neg Hx   . Diabetes Neg Hx   . Hypertension Neg Hx     No Known Allergies  Current Outpatient Prescriptions on File Prior to Visit  Medication Sig Dispense Refill  . buPROPion (WELLBUTRIN) 100 MG tablet Take 2 tablets (200 mg total) by mouth 2 (two) times daily. 360 tablet 0  . cholecalciferol (VITAMIN D) 1000 UNITS tablet Take 5,000 Units by mouth 3 (three) times a week.     . Cyanocobalamin 2500 MCG SUBL Place 1 tablet (2,500 mcg total) under the tongue 3 (three) times a week.  0  . Flaxseed, Linseed, (FLAX SEED OIL PO) Take by mouth 3 (three) times a week.     . hyoscyamine (LEVBID) 0.375 MG 12 hr tablet Take 0.375 mg by mouth daily.  0  . lisinopril (PRINIVIL,ZESTRIL) 10 MG tablet Take 1 tablet (10 mg total) by mouth daily. 90 tablet 1  . lithium carbonate (LITHOBID) 300 MG CR tablet Take 1 tablet (300 mg total) by mouth 2 (two) times daily. 180 tablet 0  . Multiple Vitamin (MULTIVITAMIN) tablet Take 1 tablet by mouth 3 (three) times a week.     Marland Kitchen PARoxetine (PAXIL) 30 MG tablet Take 1 tablet (30 mg total) by mouth every morning. 90 tablet 0  . PROAIR HFA 108 (90 Base) MCG/ACT inhaler inhale 2 puffs  by mouth every 6 hours if needed 25.5 Inhaler 1  . acamprosate (CAMPRAL) 333 MG tablet Take 2 tablets (666 mg total) by mouth 2 (two) times daily. (Patient not taking: Reported on 03/27/2017) 120 tablet 0   No current facility-administered medications on file prior to visit.     BP 110/64 (BP Location: Right Arm, Cuff Size: Large)   Pulse 74   Temp 98.3 F (36.8 C) (Oral)   Resp 16   Ht 5\' 4"  (1.626 m)   Wt 265 lb 3.2 oz (120.3 kg)   LMP 09/11/2013   SpO2 100%   BMI 45.52 kg/m       Objective:   Physical Exam  Constitutional: She is oriented to person, place, and time. She appears well-developed and well-nourished.  Cardiovascular: Normal rate, regular rhythm and normal heart sounds.   No murmur heard. Pulmonary/Chest: Effort normal and breath sounds normal. No respiratory distress. She has no wheezes.  Musculoskeletal: She exhibits no edema.  Neurological: She is alert and oriented to person, place, and time.  Skin: Skin is warm and dry.  Psychiatric: She has a normal mood and affect. Her behavior is normal. Judgment and thought content normal.          Assessment & Plan:  Low back pain- advised pt to add tylenol and call if symptoms worsen or fail to improve.

## 2017-03-28 LAB — HIV ANTIBODY (ROUTINE TESTING W REFLEX): HIV 1&2 Ab, 4th Generation: NONREACTIVE

## 2017-03-28 LAB — HEPATITIS C ANTIBODY: HCV Ab: NEGATIVE

## 2017-03-29 NOTE — Assessment & Plan Note (Signed)
Ongoing issues.  Pt was counseled to stop drinkign.

## 2017-03-29 NOTE — Assessment & Plan Note (Addendum)
Fair control, this is being managed by psychiatry.

## 2017-03-29 NOTE — Assessment & Plan Note (Signed)
She has lost 4 pounds since her last visit.  I commended her on this.

## 2017-03-29 NOTE — Assessment & Plan Note (Signed)
bp is stable on current medications.

## 2017-04-03 ENCOUNTER — Encounter (HOSPITAL_BASED_OUTPATIENT_CLINIC_OR_DEPARTMENT_OTHER): Payer: Self-pay

## 2017-04-03 ENCOUNTER — Other Ambulatory Visit: Payer: Self-pay | Admitting: Family

## 2017-04-03 ENCOUNTER — Ambulatory Visit (HOSPITAL_BASED_OUTPATIENT_CLINIC_OR_DEPARTMENT_OTHER)
Admission: RE | Admit: 2017-04-03 | Discharge: 2017-04-03 | Disposition: A | Discharge status: Home / Self Care | Payer: Medicare Other | Source: Ambulatory Visit | Attending: Family | Admitting: Family

## 2017-04-03 DIAGNOSIS — Z1239 Encounter for other screening for malignant neoplasm of breast: Secondary | ICD-10-CM

## 2017-04-03 DIAGNOSIS — Z1231 Encounter for screening mammogram for malignant neoplasm of breast: Secondary | ICD-10-CM | POA: Insufficient documentation

## 2017-04-03 DIAGNOSIS — E2839 Other primary ovarian failure: Secondary | ICD-10-CM

## 2017-04-03 DIAGNOSIS — Z780 Asymptomatic menopausal state: Secondary | ICD-10-CM | POA: Diagnosis not present

## 2017-06-26 ENCOUNTER — Other Ambulatory Visit (HOSPITAL_COMMUNITY): Payer: Self-pay | Admitting: Psychiatry

## 2017-06-28 ENCOUNTER — Ambulatory Visit (INDEPENDENT_AMBULATORY_CARE_PROVIDER_SITE_OTHER): Payer: Medicare Other | Admitting: Psychiatry

## 2017-06-28 ENCOUNTER — Encounter (HOSPITAL_COMMUNITY): Payer: Self-pay | Admitting: Psychiatry

## 2017-06-28 VITALS — BP 120/80 | HR 83 | Resp 16 | Ht 64.0 in | Wt 263.0 lb

## 2017-06-28 DIAGNOSIS — F411 Generalized anxiety disorder: Secondary | ICD-10-CM

## 2017-06-28 DIAGNOSIS — F4010 Social phobia, unspecified: Secondary | ICD-10-CM

## 2017-06-28 DIAGNOSIS — F3130 Bipolar disorder, current episode depressed, mild or moderate severity, unspecified: Secondary | ICD-10-CM

## 2017-06-28 DIAGNOSIS — F5102 Adjustment insomnia: Secondary | ICD-10-CM

## 2017-06-28 DIAGNOSIS — F332 Major depressive disorder, recurrent severe without psychotic features: Secondary | ICD-10-CM | POA: Diagnosis not present

## 2017-06-28 MED ORDER — PAROXETINE HCL 30 MG PO TABS: 30.0000 mg | ORAL_TABLET | Freq: Every morning | ORAL | 0 refills | Status: DC

## 2017-06-28 MED ORDER — BUPROPION HCL 100 MG PO TABS: 200.0000 mg | ORAL_TABLET | Freq: Two times a day (BID) | ORAL | 0 refills | Status: DC

## 2017-06-28 MED ORDER — LITHIUM CARBONATE ER 300 MG PO TBCR: 300.0000 mg | EXTENDED_RELEASE_TABLET | Freq: Two times a day (BID) | ORAL | 0 refills | Status: DC

## 2017-06-28 NOTE — Progress Notes (Signed)
Patient ID: Julie Powell, female   DOB: 03-05-1961, 56 y.o.   MRN: [id number] [place] Follow-up Outpatient Visit  Julie Powell Jul 16, 1961   Date: 06/28/2017 Chief Complaint  Patient presents with  . Follow-up    HPI Comments: Julie Powell is a 56  Y/O woman with a past psychiatric history Major Depressive Disorder, severe, recurrent. Mood disorder due to [facility], Alcohol use disorder and Social Phobia.  The patient is referred for psychiatric services for  medication management.   Patient is tolerating medication she chest he was so busy but mostly stays at home because her social phobia she does not socialize her son is moved out. She still drinks today she endorses drinkingthe larger bottle of wine one the day and not regular bottle because you talking about detox and possible outpatient detox.  She remains concerned and is still not interested in admitting herself so he thought about cutting down the alcohol to a point that we can do outpatient detox Remains dysthymic at times a motivated but she understands all call can be contributing and making the medication not as effective as it should be  Lithium level is 0.5 TSH is normal basic chemistry is normal   . Duration: since age 56 . Timing: more so in evening  . Context: alcohol use.low self esteem . Modifying factors: meds, son . Associated signs and symptoms: No delusions hallucinations or manic symptoms   Traumatic Brain Injury: No none   Review of Systems  Constitutional: Negative for fever.  Cardiovascular: Negative for chest pain.  Gastrointestinal: Negative for nausea.  Skin: Negative for itching.  Neurological: Negative for tremors and headaches.  Psychiatric/Behavioral: Negative for substance abuse and suicidal ideas.   Vitals:   06/28/17 1036  BP: 120/80  Pulse: 83  Resp: 16  SpO2: 94%  Weight: 263 lb (119.3 kg)  Height: 5\' 4"  (1.626 m)   Physical Exam  Constitutional: She appears  well-developed and well-nourished. No distress.  Skin: She is not diaphoretic.  No tremors of hands noted while patient was interviewed.     Past Medical History: Reviewed  Past Medical History:  Diagnosis Date  . Anxiety   . Asthma   . B12 deficiency   . Bipolar 1 disorder ([place]) 2010   ect  . Fibroids   . Hypertension   . Plantar fasciitis 2013  . Sciatica    RT hip  . Vitamin D deficiency     Allergies: No Known Allergies   Current Medications: Reviewed   Current Outpatient Prescriptions on File Prior to Visit  Medication Sig Dispense Refill  . cholecalciferol (VITAMIN D) 1000 UNITS tablet Take 5,000 Units by mouth 3 (three) times a week.     . Cyanocobalamin 2500 MCG SUBL Place 1 tablet (2,500 mcg total) under the tongue 3 (three) times a week.  0  . Flaxseed, Linseed, (FLAX SEED OIL PO) Take by mouth 3 (three) times a week.     . hyoscyamine (LEVBID) 0.375 MG 12 hr tablet Take 0.375 mg by mouth daily.  0  . lisinopril (PRINIVIL,ZESTRIL) 10 MG tablet Take 1 tablet (10 mg total) by mouth daily. 90 tablet 1  . Multiple Vitamin (MULTIVITAMIN) tablet Take 1 tablet by mouth 3 (three) times a week.     Marland Kitchen PROAIR HFA 108 (90 Base) MCG/ACT inhaler inhale 2 puffs by mouth every 6 hours if needed 25.5 Inhaler 1   No current facility-administered medications on file prior to visit.  Family History: Reviewed  Family History   Problem  Relation  Age of Onset   .  Adopted: Yes   .  Aneurysm  Father     Psychiatric Specialty Exam:  Objective: Appearance: Well Groomed   Eye Contact:: Good   Speech: Clear and Coherent and Normal Rate   Volume: Normal   Mood: somewhat dysthymic  Affect: congruent  Thought Process: Goal Directed, Logical and Loose   Orientation: Full   Thought Content: WDL   Suicidal Thoughts: Patient denies   Homicidal Thoughts: Patient denies   Judgement: Fair   Insight: Fair   Psychomotor Activity: Normal   Akathisia: No   Handed: Right    Memory: Immediate 3/3, Recent: 1/3   [place] of knowledge-average to above average.  AIMS (if indicated): Not indicated at this time.   Assets: Communication Skills  Desire for Improvement    Assessment:  AXIS I: Major Depressive Disorder, ; GAD: insomnia: Alcohol use disorder Treatment Plan/Recommendations:   Plan of Care:  Provided supportive therapy. Reviewed side effects . Sent prescriptions.       .   Medications:  Continue medications with no change::   Bipolar depression: baseline. But continues to drink that she is not ready to quit. Would not increase lithium as she continues to drink. Levels is 0.5. Depression not worse then last visit Anxiety:fluctuates. Continue paxil Depression: baseline. [place]illy dysthymic . Continue wellbutrin.   Alcohol may be contributing to keep her mood low as well. Not ready for detox. Recommend inpatient detox when ready unless she cuts down amount further Reviewed concerns and questions were addressed. Encouraged abstinence from alcohol and to join AA   FU 2-3 months or earlier if needed,. Refills sent         Merian Capron, M.D.  06/28/2017 10:52 AM

## 2017-07-12 ENCOUNTER — Telehealth (HOSPITAL_COMMUNITY): Payer: Self-pay | Admitting: *Deleted

## 2017-07-12 ENCOUNTER — Encounter (HOSPITAL_COMMUNITY): Payer: Self-pay | Admitting: Psychiatry

## 2017-07-12 NOTE — Telephone Encounter (Signed)
Pt will need a letter for a service animal for [place]. Please fax letter to [facility] at [phone].

## 2017-07-12 NOTE — Telephone Encounter (Signed)
Pt called office requesting a letter to be written for a service animal. Letter was completed and faxed to [place]. Confirmation received at 3:20pm.

## 2017-07-12 NOTE — Telephone Encounter (Signed)
Printed

## 2017-07-31 ENCOUNTER — Ambulatory Visit: Payer: Self-pay | Admitting: Family

## 2017-07-31 DIAGNOSIS — Z0289 Encounter for other administrative examinations: Secondary | ICD-10-CM

## 2017-08-07 ENCOUNTER — Encounter: Payer: Self-pay | Admitting: Family

## 2017-08-07 ENCOUNTER — Ambulatory Visit (INDEPENDENT_AMBULATORY_CARE_PROVIDER_SITE_OTHER): Payer: Medicare Other | Admitting: Family

## 2017-08-07 VITALS — BP 116/68 | HR 81 | Temp 98.4°F | Resp 18 | Ht 64.0 in | Wt 266.0 lb

## 2017-08-07 DIAGNOSIS — F1010 Alcohol abuse, uncomplicated: Secondary | ICD-10-CM

## 2017-08-07 DIAGNOSIS — E538 Deficiency of other specified B group vitamins: Secondary | ICD-10-CM

## 2017-08-07 DIAGNOSIS — F332 Major depressive disorder, recurrent severe without psychotic features: Secondary | ICD-10-CM | POA: Diagnosis not present

## 2017-08-07 DIAGNOSIS — Z23 Encounter for immunization: Secondary | ICD-10-CM | POA: Diagnosis not present

## 2017-08-07 DIAGNOSIS — I10 Essential (primary) hypertension: Secondary | ICD-10-CM

## 2017-08-07 DIAGNOSIS — J45909 Unspecified asthma, uncomplicated: Secondary | ICD-10-CM | POA: Diagnosis not present

## 2017-08-07 LAB — COMPREHENSIVE METABOLIC PANEL
ALT: 59 U/L — ABNORMAL HIGH (ref 0–35)
AST: 95 U/L — ABNORMAL HIGH (ref 0–37)
Albumin: 3.9 g/dL (ref 3.5–5.2)
Alkaline Phosphatase: 152 U/L — ABNORMAL HIGH (ref 39–117)
BUN: 12 mg/dL (ref 6–23)
CO2: 28 mEq/L (ref 19–32)
Calcium: 9.5 mg/dL (ref 8.4–10.5)
Chloride: 101 mEq/L (ref 96–112)
Creatinine, Ser: 0.71 mg/dL (ref 0.40–1.20)
GFR: 110.34 mL/min (ref >60.00)
Glucose, Bld: 92 mg/dL (ref 70–99)
Potassium: 4.4 mEq/L (ref 3.5–5.1)
Sodium: 135 mEq/L (ref 135–145)
Total Bilirubin: 0.5 mg/dL (ref 0.2–1.2)
Total Protein: 7 g/dL (ref 6.0–8.3)

## 2017-08-07 LAB — VITAMIN B12: Vitamin B-12: 483 pg/mL (ref 211–911)

## 2017-08-07 NOTE — Progress Notes (Signed)
Subjective:    Patient ID: Julie Powell, female    DOB: [birth year], 56 y.o.   MRN: [id number]  HPI  Julie Powell is a 56 yr old female who presents today for follow up.  1) HTN- good compliance with lisinopril. Denies cough.   BP Readings from Last 3 Encounters:  08/07/17 116/68  03/27/17 110/64  12/12/16 116/70   2) Depression- maintained on paxil, lithium, wellbutrin. Folled by Dr. De Nurse, psychiatry. Reports that her depression is "about the same." Denies thoughts of hurting herself but does report hopelessness. Reports that she is up to date on her psych appointments.   3) Asthma- reports that she uses albuterol infrequently.    4) b12 deficiency- currently out of oral b12. Plans to restart .  Review of Systems See HPI  Past Medical History:  Diagnosis Date  . Anxiety   . Asthma   . B12 deficiency   . Bipolar 1 disorder ([place]) 2010   ect  . Fibroids   . Hypertension   . Plantar fasciitis 2013  . Sciatica    RT hip  . Vitamin D deficiency      Social History   Social History  . Marital status: Divorced    Spouse name: N/A  . Number of children: N/A  . Years of education: N/A   Occupational History  . Not on file.   Social History Main Topics  . Smoking status: Former Smoker    Packs/day: 0.50    Years: 10.00    Types: Cigarettes    Quit date: 11/07/1991  . Smokeless tobacco: Never Used  . Alcohol use 12.6 oz/week    21 Glasses of wine per week  . Drug use: No     Comment: Caffeine- Carbonated beverage 16 ounces.  . Sexual activity: No   Other Topics Concern  . 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   Enjoys television    Past Surgical History:  Procedure Laterality Date  . CESAREAN  SECTION  1995  . CYSTECTOMY  1998   "thyroglycol duct cyst in neck" had dysphagia, was told benign  . GASTRIC BYPASS  2003  . MICRODISCECTOMY LUMBAR  09/11/14   L3-L4  Dr Saintclair Halsted  . PILONIDAL CYST DRAINAGE  1982  . REDUCTION MAMMAPLASTY    . SP CHOLECYSTOMY  1986    Family History  Problem Relation Age of Onset  . Adopted: Yes  . Aneurysm Father   . Alcohol abuse Father   . Alcohol abuse Brother   . Alcohol abuse Brother   . Cancer Brother        prostate  . Alcohol abuse Brother   . Lung cancer Sister   . Cancer Sister        lung  . Thyroid disease Neg Hx   . Diabetes Neg Hx   . Hypertension Neg Hx     No Known Allergies  Current Outpatient Prescriptions on File Prior to Visit  Medication Sig Dispense Refill  . buPROPion (WELLBUTRIN) 100 MG tablet Take 2 tablets (200 mg total) by mouth 2 (two) times daily. 360 tablet 0  . cholecalciferol (VITAMIN D) 1000 UNITS tablet Take 5,000 Units by mouth 3 (three)  times a week.     . Cyanocobalamin 2500 MCG SUBL Place 1 tablet (2,500 mcg total) under the tongue 3 (three) times a week.  0  . Flaxseed, Linseed, (FLAX SEED OIL PO) Take by mouth 3 (three) times a week.     . hyoscyamine (LEVBID) 0.375 MG 12 hr tablet Take 0.375 mg by mouth daily.  0  . lisinopril (PRINIVIL,ZESTRIL) 10 MG tablet Take 1 tablet (10 mg total) by mouth daily. 90 tablet 1  . lithium carbonate (LITHOBID) 300 MG CR tablet Take 1 tablet (300 mg total) by mouth 2 (two) times daily. 180 tablet 0  . Multiple Vitamin (MULTIVITAMIN) tablet Take 1 tablet by mouth 3 (three) times a week.     Marland Kitchen PARoxetine (PAXIL) 30 MG tablet Take 1 tablet (30 mg total) by mouth every morning. 90 tablet 0  . PROAIR HFA 108 (90 Base) MCG/ACT inhaler inhale 2 puffs by mouth every 6 hours if needed 25.5 Inhaler 1   No current facility-administered medications on file prior to visit.     BP 116/68 (BP Location: Right Arm, Cuff Size: Large)   Pulse 81   Temp 98.4 F (36.9 C) (Oral)   Resp  18   Ht 5\' 4"  (1.626 m)   Wt 266 lb (120.7 kg)   LMP 09/11/2013   SpO2 98%   BMI 45.66 kg/m       Objective:   Physical Exam  Constitutional: She is oriented to person, place, and time. She appears well-developed and well-nourished.  HENT:  Head: Normocephalic and atraumatic.  Cardiovascular: Normal rate, regular rhythm and normal heart sounds.   No murmur heard. Pulmonary/Chest: Effort normal and breath sounds normal. No respiratory distress. She has no wheezes.  Musculoskeletal: She exhibits no edema.  Neurological: She is alert and oriented to person, place, and time.  Psychiatric: She has a normal mood and affect. Her behavior is normal. Judgment and thought content normal.          Assessment & Plan:

## 2017-08-07 NOTE — Patient Instructions (Signed)
Please complete lab work prior to leaving.   

## 2017-08-07 NOTE — Assessment & Plan Note (Signed)
Check b12 level.  Restart b12 oral supplement.

## 2017-08-07 NOTE — Assessment & Plan Note (Signed)
Stable, with prn use of albuterol. Continue same.

## 2017-08-07 NOTE — Assessment & Plan Note (Signed)
Uncontrolled- this is being managed by psychiatry. She is advised to call 911 in the event of suicidal ideation and she verbalizes understanding.

## 2017-08-07 NOTE — Assessment & Plan Note (Signed)
BP stable on current dose of lisinopril. Continue same, obtain bmet.

## 2017-08-07 NOTE — Assessment & Plan Note (Signed)
Reports continued alcohol abuse.  Will obtain follow up LFT's. Discussed with pt that LFT's were elevated last time we checked them.

## 2017-08-08 ENCOUNTER — Telehealth: Payer: Self-pay | Admitting: Family

## 2017-08-08 DIAGNOSIS — R945 Abnormal results of liver function studies: Secondary | ICD-10-CM

## 2017-08-08 DIAGNOSIS — R7989 Other specified abnormal findings of blood chemistry: Secondary | ICD-10-CM

## 2017-08-08 NOTE — Telephone Encounter (Signed)
Notified pt and she voices understanding. Scheduled lab appt for 10/09/17 at 11am. Future orders entered.

## 2017-08-08 NOTE — Telephone Encounter (Signed)
Please contact pt and let her know liver testing looks worse.  I recommend that she stop drinking.  I would also like her to return for further testing as pended.

## 2017-08-09 ENCOUNTER — Other Ambulatory Visit (INDEPENDENT_AMBULATORY_CARE_PROVIDER_SITE_OTHER): Payer: Medicare Other

## 2017-08-09 DIAGNOSIS — R7989 Other specified abnormal findings of blood chemistry: Secondary | ICD-10-CM

## 2017-08-09 DIAGNOSIS — R945 Abnormal results of liver function studies: Secondary | ICD-10-CM

## 2017-08-15 LAB — ALKALINE PHOSPHATASE ISOENZYMES
Alkaline phosphatase (APISO): 164 U/L — ABNORMAL HIGH (ref 33–130)
Bone Isoenzymes: 11 % — ABNORMAL LOW (ref 28–66)
Intestinal Isoenzymes: 2 % (ref 1–24)
Liver Isoenzymes: 87 % — ABNORMAL HIGH (ref 25–69)

## 2017-08-15 LAB — HEPATITIS PANEL, ACUTE
Hep A IgM: NONREACTIVE
Hep B C IgM: NONREACTIVE
Hepatitis B Surface Ag: NONREACTIVE
Hepatitis C Ab: NONREACTIVE
SIGNAL TO CUT-OFF: 0.02 (ref <1.00)

## 2017-08-17 ENCOUNTER — Encounter: Payer: Self-pay | Admitting: Family

## 2017-09-20 ENCOUNTER — Ambulatory Visit (INDEPENDENT_AMBULATORY_CARE_PROVIDER_SITE_OTHER): Payer: Medicare Other | Admitting: Psychiatry

## 2017-09-20 ENCOUNTER — Encounter (HOSPITAL_COMMUNITY): Payer: Self-pay | Admitting: Psychiatry

## 2017-09-20 DIAGNOSIS — F5102 Adjustment insomnia: Secondary | ICD-10-CM

## 2017-09-20 DIAGNOSIS — F4010 Social phobia, unspecified: Secondary | ICD-10-CM

## 2017-09-20 DIAGNOSIS — F3130 Bipolar disorder, current episode depressed, mild or moderate severity, unspecified: Secondary | ICD-10-CM

## 2017-09-20 DIAGNOSIS — F332 Major depressive disorder, recurrent severe without psychotic features: Secondary | ICD-10-CM | POA: Diagnosis not present

## 2017-09-20 MED ORDER — PAROXETINE HCL 30 MG PO TABS: 30.0000 mg | ORAL_TABLET | Freq: Every morning | ORAL | 0 refills | Status: DC

## 2017-09-20 MED ORDER — LITHIUM CARBONATE ER 300 MG PO TBCR: 300.0000 mg | EXTENDED_RELEASE_TABLET | Freq: Two times a day (BID) | ORAL | 0 refills | Status: DC

## 2017-09-20 NOTE — Progress Notes (Signed)
Patient ID: Julie Powell, female   DOB: [birth year], 56 y.o.   MRN: [id number] [facility] Follow-up Outpatient Visit  Jess Sulak 1961-05-07   Date: 06/28/2017 Chief Complaint  Patient presents with  . Follow-up  . Depression    HPI Comments: Julie Powell is a 56  Y/O woman with a past psychiatric history Major Depressive Disorder, severe, recurrent. Mood disorder due to [facility], Alcohol use disorder and Social Phobia.  The patient is referred for psychiatric services for  medication management.   She has moved and stopped drinking. Feels more alert and better. Keeping busy. Son didn't help her much Less anxious No craving as for now   Lithium level is 0.5 TSH is normal basic chemistry is normal   . Duration: since age 56 . Timing: more so in evening  . Context: stopped alcohol now . Modifying factors:meds . Associated signs and symptoms: No psychosis   Traumatic Brain Injury: No none   Review of Systems  Constitutional: Negative for fever.  Cardiovascular: Negative for palpitations.  Gastrointestinal: Negative for nausea.  Skin: Negative for itching.  Neurological: Negative for tremors and headaches.  Psychiatric/Behavioral: Negative for depression, substance abuse and suicidal ideas.   There were no vitals filed for this visit. Physical Exam  Constitutional: She appears well-developed and well-nourished. No distress.  Skin: She is not diaphoretic.  No tremors of hands noted while patient was interviewed.     Past Medical History: Reviewed  Past Medical History:  Diagnosis Date  . Anxiety   . Asthma   . B12 deficiency   . Bipolar 1 disorder ([place]) 2010   ect  . Fibroids   . Hypertension   . Plantar fasciitis 2013  . Sciatica    RT hip  . Vitamin D deficiency     Allergies: No Known Allergies   Current Medications: Reviewed   Current Outpatient Medications on File Prior to Visit  Medication Sig Dispense Refill  . buPROPion (WELLBUTRIN)  100 MG tablet Take 2 tablets (200 mg total) by mouth 2 (two) times daily. 360 tablet 0  . cholecalciferol (VITAMIN D) 1000 UNITS tablet Take 5,000 Units by mouth 3 (three) times a week.     . Cyanocobalamin 2500 MCG SUBL Place 1 tablet (2,500 mcg total) under the tongue 3 (three) times a week.  0  . Flaxseed, Linseed, (FLAX SEED OIL PO) Take by mouth 3 (three) times a week.     . hyoscyamine (LEVBID) 0.375 MG 12 hr tablet Take 0.375 mg by mouth daily.  0  . lisinopril (PRINIVIL,ZESTRIL) 10 MG tablet Take 1 tablet (10 mg total) by mouth daily. 90 tablet 1  . Multiple Vitamin (MULTIVITAMIN) tablet Take 1 tablet by mouth 3 (three) times a week.     Marland Kitchen PROAIR HFA 108 (90 Base) MCG/ACT inhaler inhale 2 puffs by mouth every 6 hours if needed 25.5 Inhaler 1   No current facility-administered medications on file prior to visit.       Family History: Reviewed  Family History   Problem  Relation  Age of Onset   .  Adopted: Yes   .  Aneurysm  Father     Psychiatric Specialty Exam:  Objective: Appearance: Well Groomed   Eye Contact:: Good   Speech: Clear and Coherent and Normal Rate   Volume: Normal   Mood: fair  Affect: congruent reactive  Thought Process: Goal Directed, Logical and Loose   Orientation: Full   Thought Content: WDL   Suicidal Thoughts:  Patient denies   Homicidal Thoughts: Patient denies   Judgement: Fair   Insight: Fair   Psychomotor Activity: Normal   Akathisia: No   Handed: Right   Memory: Immediate 3/3, Recent: 1/3   [place] of knowledge-average to above average.  AIMS (if indicated): Not indicated at this time.   Assets: Communication Skills  Desire for Improvement    Assessment:  AXIS I: Major Depressive Disorder, ; GAD: insomnia: Alcohol use disorder Treatment Plan/Recommendations:   Plan of Care:  Provided supportive therapy. Reviewed side effects . Sent prescriptions.       .   Medications:  Continue medications with no change::    Bipolar depression: improved since off alcohol. Continue wellbutrin, lithium Anxiety:better. Continue paxil Depression: less dysphoric Alcohol may be contributing to keep her mood: she has stopped and feeling more alert Encouraged compliance and prevent relapse. Consider AA  FU 2-3 months or earlier if needed,. Refills due were sent         Merian Capron, M.D.  09/20/2017 11:04 AM

## 2017-09-28 ENCOUNTER — Other Ambulatory Visit: Payer: Self-pay | Admitting: Family

## 2017-09-28 ENCOUNTER — Other Ambulatory Visit (HOSPITAL_COMMUNITY): Payer: Self-pay | Admitting: Psychiatry

## 2017-09-28 DIAGNOSIS — I10 Essential (primary) hypertension: Secondary | ICD-10-CM

## 2017-10-03 ENCOUNTER — Other Ambulatory Visit (HOSPITAL_COMMUNITY): Payer: Self-pay

## 2017-10-03 MED ORDER — BUPROPION HCL 100 MG PO TABS: 200.0000 mg | ORAL_TABLET | Freq: Two times a day (BID) | ORAL | 0 refills | Status: DC

## 2017-10-03 NOTE — Telephone Encounter (Signed)
Pharmacy sent over fax requesting for a refill on bupropion 100mg . Sent medication in to pharmacy. Patients next office visit is on 12/13/17. Nothing further is needed at this time.

## 2017-10-22 NOTE — Progress Notes (Deleted)
Subjective:   Julie Powell is a 56 y.o. female who presents for Medicare Annual (Subsequent) preventive examination.  Review of Systems:  No ROS.  Medicare Wellness Visit. Additional risk factors are reflected in the social history.    Sleep patterns: {SX; SLEEP PATTERNS:18802::"feels rested on waking","does not get up to void","gets up *** times nightly to void","sleeps *** hours nightly"}.   Home Safety/Smoke Alarms: Feels safe in home. Smoke alarms in place.     Female:   Pap- last 04/26/16-normal      Mammo- last 04/04/17-normal      Dexa scan- last 04/03/17-normal      CCS- last 11/07/13- normal per pt     Objective:     Vitals: LMP 09/11/2013   There is no height or weight on file to calculate BMI.  Advanced Directives 10/17/2016 12/29/2014 08/06/2014  Does Patient Have a Medical Advance Directive? No No No  Does patient want to make changes to medical advance directive? No - Patient declined - -  Would patient like information on creating a medical advance directive? - No - patient declined information No - patient declined information    Tobacco Social History   Tobacco Use  Smoking Status Former Smoker  . Packs/day: 0.50  . Years: 10.00  . Pack years: 5.00  . Types: Cigarettes  . Last attempt to quit: 11/07/1991  . Years since quitting: 25.9  Smokeless Tobacco Never Used     Counseling given: Not Answered   Clinical Intake:                       Past Medical History:  Diagnosis Date  . Anxiety   . Asthma   . B12 deficiency   . Bipolar 1 disorder ([place]) 2010   ect  . Fibroids   . Hypertension   . Plantar fasciitis 2013  . Sciatica    RT hip  . Vitamin D deficiency    Past Surgical History:  Procedure Laterality Date  . CESAREAN SECTION  1995  . CYSTECTOMY  1998   "thyroglycol duct cyst in neck" had dysphagia, was told benign  . GASTRIC BYPASS  2003  . MICRODISCECTOMY LUMBAR  09/11/14   L3-L4  Dr Saintclair Halsted  . PILONIDAL CYST DRAINAGE   1982  . REDUCTION MAMMAPLASTY    . SP CHOLECYSTOMY  1986   Family History  Adopted: Yes  Problem Relation Age of Onset  . Aneurysm Father   . Alcohol abuse Father   . Alcohol abuse Brother   . Alcohol abuse Brother   . Cancer Brother        prostate  . Alcohol abuse Brother   . Lung cancer Sister   . Cancer Sister        lung  . Thyroid disease Neg Hx   . Diabetes Neg Hx   . Hypertension Neg Hx    Social History   Socioeconomic History  . Marital status: Divorced    Spouse name: Not on file  . Number of children: Not on file  . Years of education: Not on file  . Highest education level: Not on file  Social Needs  . Financial resource strain: Not on file  . Food insecurity - worry: Not on file  . Food insecurity - inability: Not on file  . Transportation needs - medical: Not on file  . Transportation needs - non-medical: Not on file  Occupational History  . Not on file  Tobacco Use  .  Smoking status: Former Smoker    Packs/day: 0.50    Years: 10.00    Pack years: 5.00    Types: Cigarettes    Last attempt to quit: 11/07/1991    Years since quitting: 25.9  . Smokeless tobacco: Never Used  Substance and Sexual Activity  . Alcohol use: Yes    Alcohol/week: 12.6 oz    Types: 21 Glasses of wine per week  . Drug use: No    Comment: Caffeine- Carbonated beverage 16 ounces.  . Sexual activity: No    Birth control/protection: Post-menopausal  Other Topics Concern  . 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   Enjoys television    Outpatient Encounter Medications as of 10/23/2017  Medication Sig  . buPROPion (WELLBUTRIN) 100 MG tablet Take 2 tablets (200 mg total) by mouth 2 (two) times daily.  .  cholecalciferol (VITAMIN D) 1000 UNITS tablet Take 5,000 Units by mouth 3 (three) times a week.   . Cyanocobalamin 2500 MCG SUBL Place 1 tablet (2,500 mcg total) under the tongue 3 (three) times a week.  . Flaxseed, Linseed, (FLAX SEED OIL PO) Take by mouth 3 (three) times a week.   . hyoscyamine (LEVBID) 0.375 MG 12 hr tablet Take 0.375 mg by mouth daily.  Marland Kitchen lisinopril (PRINIVIL,ZESTRIL) 10 MG tablet TAKE 1 TABLET BY MOUTH ONCE DAILY  . lithium carbonate (LITHOBID) 300 MG CR tablet Take 1 tablet (300 mg total) 2 (two) times daily by mouth.  . Multiple Vitamin (MULTIVITAMIN) tablet Take 1 tablet by mouth 3 (three) times a week.   Marland Kitchen PARoxetine (PAXIL) 30 MG tablet Take 1 tablet (30 mg total) every morning by mouth.  Marland Kitchen PROAIR HFA 108 (90 Base) MCG/ACT inhaler inhale 2 puffs by mouth every 6 hours if needed   No facility-administered encounter medications on file as of 10/23/2017.     Activities of Daily Living No flowsheet data found.  Patient Care Team: [name], NP as PCP - General (Internal Medicine) Merian Capron, MD as Consulting Physician (Psychiatry) Renato Shin, MD as Consulting Physician (Endocrinology) Lucienne Capers, MD as Consulting Physician (Gastroenterology)    Assessment:   This is a routine wellness examination for [name].Physical assessment deferred to PCP.  Exercise Activities and Dietary recommendations   Diet (meal preparation, eat out, water intake, caffeinated beverages, dairy products, fruits and vegetables): {Desc; diets:16563} Breakfast: Lunch:  Dinner:      Goals    None      Fall Risk Fall Risk  10/17/2016 03/21/2016  Falls in the past year? No No   Depression Screen PHQ 2/9 Scores 08/07/2017 10/17/2016 03/21/2016  PHQ - 2 Score 6 2 6   PHQ- 9 Score 18 5 18      Cognitive Function MMSE - Mini Mental State Exam 10/17/2016  Orientation to time 5  Orientation to Place 5  Registration 3  Attention/ Calculation 5  Recall 2    Language- name 2 objects 2  Language- repeat 1  Language- follow 3 step command 3  Language- read & follow direction 1  Write a sentence 1  Copy design 1  Total score 29        Immunization History  Administered Date(s) Administered  .  Influenza,inj,Quad PF,6+ Mos 08/12/2014, 09/22/2015, 09/13/2016, 08/07/2017  . Pneumococcal Polysaccharide-23 08/29/2011  . Tdap 11/13/2014   Screening Tests Health Maintenance  Topic Date Due  . MAMMOGRAM  04/04/2019  . DEXA SCAN  04/04/2019  . PAP SMEAR  04/27/2019  . COLONOSCOPY  11/08/2023  . TETANUS/TDAP  11/13/2024  . INFLUENZA VACCINE  Completed  . Hepatitis C Screening  Completed  . HIV Screening  Completed    Plan:   ***   I have personally reviewed and noted the following in the patient's chart:   . Medical and social history . Use of alcohol, tobacco or illicit drugs  . Current medications and supplements . Functional ability and status . Nutritional status . Physical activity . Advanced directives . List of other physicians . Hospitalizations, surgeries, and ER visits in previous 12 months . Vitals . Screenings to include cognitive, depression, and falls . Referrals and appointments  In addition, I have reviewed and discussed with patient certain preventive protocols, quality metrics, and best practice recommendations. A written personalized care plan for preventive services as well as general preventive health recommendations were provided to patient.     Shela Nevin, [place]  10/22/2017

## 2017-10-23 ENCOUNTER — Ambulatory Visit: Payer: Self-pay | Admitting: *Deleted

## 2017-11-19 NOTE — Progress Notes (Addendum)
Subjective:   Julie Powell is a 57 y.o. female who presents for Medicare Annual (Subsequent) preventive examination.  Patient Concerns:  Pt states she feels like she is drinking too much. States she has discussed with her behavioral health doctor recently.Reports drinking approximately a bottle of wine per day. Declines blackouts or feeling out of control. States she never drinks and drives. Pt states she has cut back her alcohol consumption on her own in the past and is going to try to do it again. Pt understands risks and reports she will seek assistance if she needs it. Pt denies any needs at this time.  Review of Systems:  No ROS.  Medicare Wellness Visit. Additional risk factors are reflected in the social history. Cardiac Risk Factors include: advanced age (>39men, >25 women);dyslipidemia;hypertension;obesity (BMI >30kg/m2);sedentary lifestyle Sleep patterns: Sleeps well about 8 hrs per night. Home Safety/Smoke Alarms: Feels safe in home. Smoke alarms in place.  Lives alone in apt. Son come to visit for lunch 4-5x/ week.   Female:   Pap- last 04/26/16: normal Mammo- last 04/03/17: BI-RADS CATEGORY  1: Negative.      Dexa scan-  Last 04/03/17: normal    CCS- last 11/07/13: reported as normal  Objective:     Vitals: BP 129/81 (BP Location: Left Wrist, Patient Position: Sitting, Cuff Size: Normal)   Pulse 70   Wt 261 lb (118.4 kg)   LMP 09/11/2013   SpO2 97%   BMI 44.80 kg/m   Body mass index is 44.8 kg/m.  Advanced Directives 11/22/2017 10/17/2016 12/29/2014 08/06/2014  Does Patient Have a Medical Advance Directive? No No No No  Does patient want to make changes to medical advance directive? - No - Patient declined - -  Would patient like information on creating a medical advance directive? No - Patient declined - No - patient declined information No - patient declined information    Tobacco Social History   Tobacco Use  Smoking Status Former Smoker  . Packs/day: 0.50  .  Years: 10.00  . Pack years: 5.00  . Types: Cigarettes  . Last attempt to quit: 11/07/1991  . Years since quitting: 26.0  Smokeless Tobacco Never Used     Counseling given: Not Answered   Clinical Intake: Pain : No/denies pain   Past Medical History:  Diagnosis Date  . Anxiety   . Asthma   . B12 deficiency   . Bipolar 1 disorder ([place]) 2010   ect  . Fibroids   . Hypertension   . Plantar fasciitis 2013  . Sciatica    RT hip  . Vitamin D deficiency    Past Surgical History:  Procedure Laterality Date  . CESAREAN SECTION  1995  . CYSTECTOMY  1998   "thyroglycol duct cyst in neck" had dysphagia, was told benign  . GASTRIC BYPASS  2003  . MICRODISCECTOMY LUMBAR  09/11/14   L3-L4  Dr Saintclair Halsted  . PILONIDAL CYST DRAINAGE  1982  . REDUCTION MAMMAPLASTY    . SP CHOLECYSTOMY  1986   Family History  Adopted: Yes  Problem Relation Age of Onset  . Aneurysm Father   . Alcohol abuse Father   . Alcohol abuse Brother   . Alcohol abuse Brother   . Cancer Brother        prostate  . Alcohol abuse Brother   . Lung cancer Sister   . Cancer Sister        lung  . Thyroid disease Neg Hx   . Diabetes  Neg Hx   . Hypertension Neg Hx    Social History   Socioeconomic History  . Marital status: Divorced    Spouse name: None  . Number of children: None  . Years of education: None  . Highest education level: None  Social Needs  . Financial resource strain: None  . Food insecurity - worry: None  . Food insecurity - inability: None  . Transportation needs - medical: None  . Transportation needs - non-medical: None  Occupational History  . None  Tobacco Use  . Smoking status: Former Smoker    Packs/day: 0.50    Years: 10.00    Pack years: 5.00    Types: Cigarettes    Last attempt to quit: 11/07/1991    Years since quitting: 26.0  . Smokeless tobacco: Never Used  Substance and Sexual Activity  . Alcohol use: Yes    Alcohol/week: 16.8 oz    Types: 28 Glasses of wine per week  .  Drug use: No    Comment: Caffeine- Carbonated beverage 16 ounces.  . Sexual activity: No    Birth control/protection: Post-menopausal  Other Topics Concern  . None  Social History Narrative   Lives with 72 year old son   She is on disability due to depression   In the past she has worked as a Education officer, community for Systems developer   Former Mud[employer]   Completed bachelor's degree   Divorced.   Enjoys television    Outpatient Encounter Medications as of 11/22/2017  Medication Sig  . buPROPion (WELLBUTRIN) 100 MG tablet Take 2 tablets (200 mg total) by mouth 2 (two) times daily.  . cholecalciferol (VITAMIN D) 1000 UNITS tablet Take 5,000 Units by mouth 3 (three) times a week.   . Cyanocobalamin 2500 MCG SUBL Place 1 tablet (2,500 mcg total) under the tongue 3 (three) times a week.  . Flaxseed, Linseed, (FLAX SEED OIL PO) Take by mouth 3 (three) times a week.   . hyoscyamine (LEVBID) 0.375 MG 12 hr tablet Take 0.375 mg by mouth daily.  Marland Kitchen lisinopril (PRINIVIL,ZESTRIL) 10 MG tablet TAKE 1 TABLET BY MOUTH ONCE DAILY  . lithium carbonate (LITHOBID) 300 MG CR tablet Take 1 tablet (300 mg total) 2 (two) times daily by mouth.  . Multiple Vitamin (MULTIVITAMIN) tablet Take 1 tablet by mouth 3 (three) times a week.   Marland Kitchen PARoxetine (PAXIL) 30 MG tablet Take 1 tablet (30 mg total) every morning by mouth.  Marland Kitchen PROAIR HFA 108 (90 Base) MCG/ACT inhaler inhale 2 puffs by mouth every 6 hours if needed   No facility-administered encounter medications on file as of 11/22/2017.     Activities of Daily Living In your present state of health, do you have any difficulty performing the following activities: 11/22/2017  Hearing? N  Vision? N  Comment wearing glasses. Pt states she will schedule eye exam.   Difficulty concentrating or making decisions? N  Walking or climbing stairs? N  Dressing or bathing? N  Doing errands, shopping? N  Preparing Food and  eating ? N  Using the Toilet? N  In the past six months, have you accidently leaked urine? N  Do you have problems with loss of bowel control? N  Managing your Medications? N  Managing your Finances? N  Housekeeping or managing your Housekeeping? N  Some recent data might be hidden    Patient Care Team: [name], NP as PCP - General (Internal Medicine) Merian Capron,  MD as Consulting Physician (Psychiatry) Renato Shin, MD as Consulting Physician (Endocrinology) Lucienne Capers, MD as Consulting Physician (Gastroenterology)    Assessment:   This is a routine wellness examination for Julie Powell. Physical assessment deferred to PCP.  Exercise Activities and Dietary recommendations Current Exercise Habits: The patient does not participate in regular exercise at present, Exercise limited by: None identified Diet (meal preparation, eat out, water intake, caffeinated beverages, dairy products, fruits and vegetables): well balanced    Goals    . Stop drinking so much (pt-stated)       Fall Risk Fall Risk  11/22/2017 10/17/2016 03/21/2016  Falls in the past year? No No No    Depression Screen PHQ 2/9 Scores 11/22/2017 08/07/2017 10/17/2016 03/21/2016  PHQ - 2 Score 6 6 2 6   PHQ- 9 Score 17 18 5 18      Cognitive Function MMSE - Mini Mental State Exam 11/22/2017 10/17/2016  Orientation to time 5 5  Orientation to Place 5 5  Registration 3 3  Attention/ Calculation 5 5  Recall 3 2  Language- name 2 objects 2 2  Language- repeat 1 1  Language- follow 3 step command 3 3  Language- read & follow direction 1 1  Write a sentence 1 1  Copy design 1 1  Total score 30 29        Immunization History  Administered Date(s) Administered  . Influenza,inj,Quad PF,6+ Mos 08/12/2014, 09/22/2015, 09/13/2016, 08/07/2017  . Pneumococcal Polysaccharide-23 08/29/2011  . Tdap 11/13/2014    Screening Tests Health Maintenance  Topic Date Due  . MAMMOGRAM  04/04/2019  . DEXA SCAN   04/04/2019  . PAP SMEAR  04/27/2019  . COLONOSCOPY  11/08/2023  . TETANUS/TDAP  11/13/2024  . INFLUENZA VACCINE  Completed  . Hepatitis C Screening  Completed  . HIV Screening  Completed      Plan:   Follow up with Melissa NP as scheduled on 02/05/18.   Continue to eat heart healthy diet (full of fruits, vegetables, whole grains, lean protein, water--limit salt, fat, and sugar intake) and increase physical activity as tolerated.  Continue doing brain stimulating activities (puzzles, reading, adult coloring books, staying active) to keep memory sharp.     I have personally reviewed and noted the following in the patient's chart:   . Medical and social history . Use of alcohol, tobacco or illicit drugs  . Current medications and supplements . Functional ability and status . Nutritional status . Physical activity . Advanced directives . List of other physicians . Hospitalizations, surgeries, and ER visits in previous 12 months . Vitals . Screenings to include cognitive, depression, and falls . Referrals and appointments  In addition, I have reviewed and discussed with patient certain preventive protocols, quality metrics, and best practice recommendations. A written personalized care plan for preventive services as well as general preventive health recommendations were provided to patient.     Shela Nevin, Pen Mar  11/22/2017   I have reviewed the above MWE note by Ms. Vevelyn Royals and agree with her documentation Denny Peon MD

## 2017-11-22 ENCOUNTER — Ambulatory Visit (INDEPENDENT_AMBULATORY_CARE_PROVIDER_SITE_OTHER): Payer: Medicare Other | Admitting: *Deleted

## 2017-11-22 ENCOUNTER — Encounter: Payer: Self-pay | Admitting: *Deleted

## 2017-11-22 VITALS — BP 129/81 | HR 70 | Wt 261.0 lb

## 2017-11-22 DIAGNOSIS — Z0000 Encounter for general adult medical examination without abnormal findings: Secondary | ICD-10-CM

## 2017-11-22 NOTE — Patient Instructions (Signed)
Follow up with Melissa NP as scheduled on 02/05/18.   Continue to eat heart healthy diet (full of fruits, vegetables, whole grains, lean protein, water--limit salt, fat, and sugar intake) and increase physical activity as tolerated.  Continue doing brain stimulating activities (puzzles, reading, adult coloring books, staying active) to keep memory sharp.    Julie Powell , Thank you for taking time to come for your Medicare Wellness Visit. I appreciate your ongoing commitment to your health goals. Please review the following plan we discussed and let me know if I can assist you in the future.   These are the goals we discussed: Goals    . Stop drinking so much (pt-stated)       This is a list of the screening recommended for you and due dates:  Health Maintenance  Topic Date Due  . Mammogram  04/04/2019  . DEXA scan (bone density measurement)  04/04/2019  . Pap Smear  04/27/2019  . Colon Cancer Screening  11/08/2023  . Tetanus Vaccine  11/13/2024  . Flu Shot  Completed  .  Hepatitis C: One time screening is recommended by Center for Disease Control  (CDC) for  adults born from [birth year] through 1963.   Completed  . HIV Screening  Completed    Health Maintenance for Postmenopausal Women Menopause is a normal process in which your reproductive ability comes to an end. This process happens gradually over a span of months to years, usually between the ages of 40 and 29. Menopause is complete when you have missed 12 consecutive menstrual periods. It is important to talk with your health care provider about some of the most common conditions that affect postmenopausal women, such as heart disease, cancer, and bone loss (osteoporosis). Adopting a healthy lifestyle and getting preventive care can help to promote your health and wellness. Those actions can also lower your chances of developing some of these common conditions. What should I know about menopause? During menopause, you may experience a  number of symptoms, such as:  Moderate-to-severe hot flashes.  Night sweats.  Decrease in sex drive.  Mood swings.  Headaches.  Tiredness.  Irritability.  Memory problems.  Insomnia.  Choosing to treat or not to treat menopausal changes is an individual decision that you make with your health care provider. What should I know about hormone replacement therapy and supplements? Hormone therapy products are effective for treating symptoms that are associated with menopause, such as hot flashes and night sweats. Hormone replacement carries certain risks, especially as you become older. If you are thinking about using estrogen or estrogen with progestin treatments, discuss the benefits and risks with your health care provider. What should I know about heart disease and stroke? Heart disease, heart attack, and stroke become more likely as you age. This may be due, in part, to the hormonal changes that your body experiences during menopause. These can affect how your body processes dietary fats, triglycerides, and cholesterol. Heart attack and stroke are both medical emergencies. There are many things that you can do to help prevent heart disease and stroke:  Have your blood pressure checked at least every 1-2 years. High blood pressure causes heart disease and increases the risk of stroke.  If you are 54-50 years old, ask your health care provider if you should take aspirin to prevent a heart attack or a stroke.  Do not use any tobacco products, including cigarettes, chewing tobacco, or electronic cigarettes. If you need help quitting, ask your health care  provider.  It is important to eat a healthy diet and maintain a healthy weight. ? Be sure to include plenty of vegetables, fruits, low-fat dairy products, and lean protein. ? Avoid eating foods that are high in solid fats, added sugars, or salt (sodium).  Get regular exercise. This is one of the most important things that you can do  for your health. ? Try to exercise for at least 150 minutes each week. The type of exercise that you do should increase your heart rate and make you sweat. This is known as moderate-intensity exercise. ? Try to do strengthening exercises at least twice each week. Do these in addition to the moderate-intensity exercise.  Know your numbers.Ask your health care provider to check your cholesterol and your blood glucose. Continue to have your blood tested as directed by your health care provider.  What should I know about cancer screening? There are several types of cancer. Take the following steps to reduce your risk and to catch any cancer development as early as possible. Breast Cancer  Practice breast self-awareness. ? This means understanding how your breasts normally appear and feel. ? It also means doing regular breast self-exams. Let your health care provider know about any changes, no matter how small.  If you are 30 or older, have a clinician do a breast exam (clinical breast exam or CBE) every year. Depending on your age, family history, and medical history, it may be recommended that you also have a yearly breast X-ray (mammogram).  If you have a family history of breast cancer, talk with your health care provider about genetic screening.  If you are at high risk for breast cancer, talk with your health care provider about having an MRI and a mammogram every year.  Breast cancer (BRCA) gene test is recommended for women who have family members with BRCA-related cancers. Results of the assessment will determine the need for genetic counseling and BRCA1 and for BRCA2 testing. BRCA-related cancers include these types: ? Breast. This occurs in males or females. ? Ovarian. ? Tubal. This may also be called fallopian tube cancer. ? Cancer of the abdominal or pelvic lining (peritoneal cancer). ? Prostate. ? Pancreatic.  Cervical, Uterine, and Ovarian Cancer Your health care provider may  recommend that you be screened regularly for cancer of the pelvic organs. These include your ovaries, uterus, and vagina. This screening involves a pelvic exam, which includes checking for microscopic changes to the surface of your cervix (Pap test).  For women ages 21-65, health care providers may recommend a pelvic exam and a Pap test every three years. For women ages 71-65, they may recommend the Pap test and pelvic exam, combined with testing for human papilloma virus (HPV), every five years. Some types of HPV increase your risk of cervical cancer. Testing for HPV may also be done on women of any age who have unclear Pap test results.  Other health care providers may not recommend any screening for nonpregnant women who are considered low risk for pelvic cancer and have no symptoms. Ask your health care provider if a screening pelvic exam is right for you.  If you have had past treatment for cervical cancer or a condition that could lead to cancer, you need Pap tests and screening for cancer for at least 20 years after your treatment. If Pap tests have been discontinued for you, your risk factors (such as having a new sexual partner) need to be reassessed to determine if you should start  having screenings again. Some women have medical problems that increase the chance of getting cervical cancer. In these cases, your health care provider may recommend that you have screening and Pap tests more often.  If you have a family history of uterine cancer or ovarian cancer, talk with your health care provider about genetic screening.  If you have vaginal bleeding after reaching menopause, tell your health care provider.  There are currently no reliable tests available to screen for ovarian cancer.  Lung Cancer Lung cancer screening is recommended for adults 32-19 years old who are at high risk for lung cancer because of a history of smoking. A yearly low-dose CT scan of the lungs is recommended if  you:  Currently smoke.  Have a history of at least 30 pack-years of smoking and you currently smoke or have quit within the past 15 years. A pack-year is smoking an average of one pack of cigarettes per day for one year.  Yearly screening should:  Continue until it has been 15 years since you quit.  Stop if you develop a health problem that would prevent you from having lung cancer treatment.  Colorectal Cancer  This type of cancer can be detected and can often be prevented.  Routine colorectal cancer screening usually begins at age 48 and continues through age 46.  If you have risk factors for colon cancer, your health care provider may recommend that you be screened at an earlier age.  If you have a family history of colorectal cancer, talk with your health care provider about genetic screening.  Your health care provider may also recommend using home test kits to check for hidden blood in your stool.  A small camera at the end of a tube can be used to examine your colon directly (sigmoidoscopy or colonoscopy). This is done to check for the earliest forms of colorectal cancer.  Direct examination of the colon should be repeated every 5-10 years until age 45. However, if early forms of precancerous polyps or small growths are found or if you have a family history or genetic risk for colorectal cancer, you may need to be screened more often.  Skin Cancer  Check your skin from head to toe regularly.  Monitor any moles. Be sure to tell your health care provider: ? About any new moles or changes in moles, especially if there is a change in a mole's shape or color. ? If you have a mole that is larger than the size of a pencil eraser.  If any of your family members has a history of skin cancer, especially at a young age, talk with your health care provider about genetic screening.  Always use sunscreen. Apply sunscreen liberally and repeatedly throughout the day.  Whenever you are  outside, protect yourself by wearing long sleeves, pants, a wide-brimmed hat, and sunglasses.  What should I know about osteoporosis? Osteoporosis is a condition in which bone destruction happens more quickly than new bone creation. After menopause, you may be at an increased risk for osteoporosis. To help prevent osteoporosis or the bone fractures that can happen because of osteoporosis, the following is recommended:  If you are 72-45 years old, get at least 1,000 mg of calcium and at least 600 mg of vitamin D per day.  If you are older than age 52 but younger than age 84, get at least 1,200 mg of calcium and at least 600 mg of vitamin D per day.  If you are older than  age 24, get at least 1,200 mg of calcium and at least 800 mg of vitamin D per day.  Smoking and excessive alcohol intake increase the risk of osteoporosis. Eat foods that are rich in calcium and vitamin D, and do weight-bearing exercises several times each week as directed by your health care provider. What should I know about how menopause affects my mental health? Depression may occur at any age, but it is more common as you become older. Common symptoms of depression include:  Low or sad mood.  Changes in sleep patterns.  Changes in appetite or eating patterns.  Feeling an overall lack of motivation or enjoyment of activities that you previously enjoyed.  Frequent crying spells.  Talk with your health care provider if you think that you are experiencing depression. What should I know about immunizations? It is important that you get and maintain your immunizations. These include:  Tetanus, diphtheria, and pertussis (Tdap) booster vaccine.  Influenza every year before the flu season begins.  Pneumonia vaccine.  Shingles vaccine.  Your health care provider may also recommend other immunizations. This information is not intended to replace advice given to you by your health care provider. Make sure you discuss  any questions you have with your health care provider. Document Released: 12/15/2005 Document Revised: 05/12/2016 Document Reviewed: 07/27/2015 Elsevier Interactive Patient Education  2018 [employer].

## 2017-12-13 ENCOUNTER — Encounter (HOSPITAL_COMMUNITY): Payer: Self-pay | Admitting: Psychiatry

## 2017-12-13 ENCOUNTER — Other Ambulatory Visit: Payer: Self-pay

## 2017-12-13 ENCOUNTER — Ambulatory Visit (INDEPENDENT_AMBULATORY_CARE_PROVIDER_SITE_OTHER): Payer: Medicare Other | Admitting: Psychiatry

## 2017-12-13 VITALS — BP 132/88 | HR 94 | Resp 18 | Ht 64.0 in | Wt 266.0 lb

## 2017-12-13 DIAGNOSIS — F1020 Alcohol dependence, uncomplicated: Secondary | ICD-10-CM

## 2017-12-13 DIAGNOSIS — F4010 Social phobia, unspecified: Secondary | ICD-10-CM

## 2017-12-13 DIAGNOSIS — F3130 Bipolar disorder, current episode depressed, mild or moderate severity, unspecified: Secondary | ICD-10-CM | POA: Diagnosis not present

## 2017-12-13 DIAGNOSIS — Z818 Family history of other mental and behavioral disorders: Secondary | ICD-10-CM

## 2017-12-13 DIAGNOSIS — F332 Major depressive disorder, recurrent severe without psychotic features: Secondary | ICD-10-CM

## 2017-12-13 MED ORDER — PAROXETINE HCL 30 MG PO TABS: 30.0000 mg | ORAL_TABLET | Freq: Every morning | ORAL | 0 refills | Status: DC

## 2017-12-13 MED ORDER — LITHIUM CARBONATE ER 300 MG PO TBCR: 300.0000 mg | EXTENDED_RELEASE_TABLET | Freq: Two times a day (BID) | ORAL | 0 refills | Status: DC

## 2017-12-13 MED ORDER — BUPROPION HCL 100 MG PO TABS: 200.0000 mg | ORAL_TABLET | Freq: Two times a day (BID) | ORAL | 0 refills | Status: DC

## 2017-12-13 NOTE — Progress Notes (Signed)
Patient ID: Julie Powell, female   DOB: [birth year], 57 y.o.   MRN: [id number] [place] Follow-up Outpatient Visit  Aneisa Powell 16-Nov-1960   Date: 12/13/2017 Chief Complaint  Patient presents with  . Other    Bipolar I disorder, most recent episode depressed  . Follow-up    HPI Comments: [name] is a 57  Y/O woman with a past psychiatric history Major Depressive Disorder, severe, recurrent. Mood disorder due to [facility], Alcohol use disorder and Social Phobia.  The patient is referred for psychiatric services for  medication management.   Her dog died new years eve. Feeling sad.  Apparently started drinking again after 3 weeks of sobriety Not interested in detox. Says mood wise doing fair on med, understands the risk meds would not work while drinking  Past level  Lithium level is 0.5 TSH is normal basic chemistry is normal Feels meds are doing what they can.   . Duration: since age 78 . Timing: more so in evening  . Context: alcohol use.low self esteem . Modifying factors: meds, son . Associated signs and symptoms: No delusions hallucinations or manic symptoms   Traumatic Brain Injury: No none   Review of Systems  Constitutional: Negative for fever.  Cardiovascular: Negative for palpitations.  Gastrointestinal: Negative for nausea.  Skin: Negative for itching.  Neurological: Negative for tremors and headaches.  Psychiatric/Behavioral: Negative for substance abuse and suicidal ideas.   Vitals:   12/13/17 1048  BP: 132/88  Pulse: 94  Resp: 18  Weight: 266 lb (120.7 kg)  Height: 5\' 4"  (1.626 m)   Physical Exam  Constitutional: She appears well-developed and well-nourished. No distress.  Skin: She is not diaphoretic.  No tremors of hands noted while patient was interviewed.     Past Medical History: Reviewed  Past Medical History:  Diagnosis Date  . Anxiety   . Asthma   . B12 deficiency   . Bipolar 1 disorder ([place]) 2010   ect  . Fibroids    . Hypertension   . Plantar fasciitis 2013  . Sciatica    RT hip  . Vitamin D deficiency     Allergies: No Known Allergies   Current Medications: Reviewed   Current Outpatient Medications on File Prior to Visit  Medication Sig Dispense Refill  . cholecalciferol (VITAMIN D) 1000 UNITS tablet Take 5,000 Units by mouth 3 (three) times a week.     . Cyanocobalamin 2500 MCG SUBL Place 1 tablet (2,500 mcg total) under the tongue 3 (three) times a week.  0  . Flaxseed, Linseed, (FLAX SEED OIL PO) Take by mouth 3 (three) times a week.     . hyoscyamine (LEVBID) 0.375 MG 12 hr tablet Take 0.375 mg by mouth daily.  0  . lisinopril (PRINIVIL,ZESTRIL) 10 MG tablet TAKE 1 TABLET BY MOUTH ONCE DAILY 90 tablet 1  . Multiple Vitamin (MULTIVITAMIN) tablet Take 1 tablet by mouth 3 (three) times a week.     Marland Kitchen PROAIR HFA 108 (90 Base) MCG/ACT inhaler inhale 2 puffs by mouth every 6 hours if needed 25.5 Inhaler 1   No current facility-administered medications on file prior to visit.       Family History: Reviewed  Family History   Problem  Relation  Age of Onset   .  Adopted: Yes   .  Aneurysm  Father     Psychiatric Specialty Exam:  Objective: Appearance: Well Groomed   Eye Contact:: Good   Speech: Clear and Coherent and Normal  Rate   Volume: Normal   Mood: subdued  Affect: congruent  Thought Process: Goal Directed, Logical and Loose   Orientation: Full   Thought Content: WDL   Suicidal Thoughts: Patient denies   Homicidal Thoughts: Patient denies   Judgement: Fair   Insight: Fair   Psychomotor Activity: Normal   Akathisia: No   Handed: Right   Memory: Immediate 3/3, Recent: 1/3   [place] of knowledge-average to above average.  AIMS (if indicated): Not indicated at this time.   Assets: Communication Skills  Desire for Improvement    Assessment:  AXIS I: Major Depressive Disorder, ; GAD: insomnia: Alcohol use disorder Treatment Plan/Recommendations:   Plan of  Care:  Provided supportive therapy. Reviewed side effects . Sent prescriptions.       .   Medications:  Continue medications with no change::   Bipolar depression: subdued but not depressed. Continue lithium, understand wot work on abstinence of alcohol Anxiety: fluctuates. Continue paxil Depression: subdued. Continue welllbutrin.  Work on increasing acitivities  Alcohol may be contributing to keep her mood low as well. Not ready for detox. Recommend inpatient detox when ready unless she cuts down amount further Reviewed concerns and questions were addressed. Encouraged abstinence from alcohol and to join AA   FU 2-3 months or earlier if needed,. Refills sent         Julie Powell, M.D.  12/13/2017 11:05 AM

## 2017-12-18 ENCOUNTER — Other Ambulatory Visit: Payer: Self-pay | Admitting: Family

## 2018-02-05 ENCOUNTER — Encounter: Payer: Self-pay | Admitting: Family

## 2018-02-05 ENCOUNTER — Ambulatory Visit (INDEPENDENT_AMBULATORY_CARE_PROVIDER_SITE_OTHER): Payer: Medicare Other | Admitting: Family

## 2018-02-05 VITALS — BP 114/70 | HR 80 | Temp 98.0°F | Resp 18 | Ht 64.0 in | Wt 263.2 lb

## 2018-02-05 DIAGNOSIS — I10 Essential (primary) hypertension: Secondary | ICD-10-CM

## 2018-02-05 DIAGNOSIS — J4520 Mild intermittent asthma, uncomplicated: Secondary | ICD-10-CM | POA: Diagnosis not present

## 2018-02-05 DIAGNOSIS — F1010 Alcohol abuse, uncomplicated: Secondary | ICD-10-CM

## 2018-02-05 DIAGNOSIS — F329 Major depressive disorder, single episode, unspecified: Secondary | ICD-10-CM | POA: Diagnosis not present

## 2018-02-05 DIAGNOSIS — F32A Depression, unspecified: Secondary | ICD-10-CM

## 2018-02-05 LAB — COMPREHENSIVE METABOLIC PANEL
ALT: 28 U/L (ref 0–35)
AST: 35 U/L (ref 0–37)
Albumin: 3.6 g/dL (ref 3.5–5.2)
Alkaline Phosphatase: 104 U/L (ref 39–117)
BUN: 12 mg/dL (ref 6–23)
CO2: 27 mEq/L (ref 19–32)
Calcium: 9.4 mg/dL (ref 8.4–10.5)
Chloride: 105 mEq/L (ref 96–112)
Creatinine, Ser: 0.78 mg/dL (ref 0.40–1.20)
GFR: 98.81 mL/min (ref >60.00)
Glucose, Bld: 84 mg/dL (ref 70–99)
Potassium: 3.6 mEq/L (ref 3.5–5.1)
Sodium: 139 mEq/L (ref 135–145)
Total Bilirubin: 0.4 mg/dL (ref 0.2–1.2)
Total Protein: 6.8 g/dL (ref 6.0–8.3)

## 2018-02-05 MED ORDER — ALBUTEROL SULFATE HFA 108 (90 BASE) MCG/ACT IN AERS: 2.0000 | INHALATION_SPRAY | Freq: Four times a day (QID) | RESPIRATORY_TRACT | 1 refills | Status: DC | PRN

## 2018-02-05 NOTE — Patient Instructions (Signed)
Please complete lab work prior to leaving. Keep up the good work!

## 2018-02-05 NOTE — Progress Notes (Signed)
Subjective:    Patient ID: Julie Powell, female    DOB: 12-09-1960, 57 y.o.   MRN: [id number]  HPI  Julie Powell is a 57 yr old female who presents today for follow up:  1) HTN- she is maintained on lisinopril 10mg .  BP Readings from Last 3 Encounters:  02/05/18 114/70  11/22/17 129/81  08/07/17 116/68   2) Depression-She is followed by psychiatry. Reports that she is already starting to feel an improvement in her mood since she stopped drinking.   3) Asthma- reports symptoms have been well controlled.    4) Alcohol abuse-  Last drink was Friday.  Had some mild tremors but that resolved.  Had diarrhea until yesterday.     Review of Systems    see HPI  Past Medical History:  Diagnosis Date  . Anxiety   . Asthma   . B12 deficiency   . Bipolar 1 disorder ([place]) 2010   ect  . Fibroids   . Hypertension   . Plantar fasciitis 2013  . Sciatica    RT hip  . Vitamin D deficiency      Social History   Socioeconomic History  . Marital status: Divorced    Spouse name: Not on file  . Number of children: Not on file  . Years of education: Not on file  . Highest education level: Not on file  Occupational History  . Not on file  Social Needs  . Financial resource strain: Not on file  . Food insecurity:    Worry: Not on file    Inability: Not on file  . Transportation needs:    Medical: Not on file    Non-medical: Not on file  Tobacco Use  . Smoking status: Former Smoker    Packs/day: 0.50    Years: 10.00    Pack years: 5.00    Types: Cigarettes    Last attempt to quit: 11/07/1991    Years since quitting: 26.2  . Smokeless tobacco: Never Used  Substance and Sexual Activity  . Alcohol use: Not Currently    Alcohol/week: 16.8 oz    Types: 28 Glasses of wine per week    Comment: Pt stopped on 02/01/18  . Drug use: Yes    Types: "Crack" cocaine    Comment: Caffeine- Carbonated beverage 16 ounces.  . Sexual activity: Never    Birth control/protection:  Post-menopausal  Lifestyle  . Physical activity:    Days per week: Not on file    Minutes per session: Not on file  . Stress: Not on file  Relationships  . Social connections:    Talks on phone: Not on file    Gets together: Not on file    Attends religious service: Not on file    Active member of club or organization: Not on file    Attends meetings of clubs or organizations: Not on file    Relationship status: Not on file  . Intimate partner violence:    Fear of current or ex partner: Not on file    Emotionally abused: Not on file    Physically abused: Not on file    Forced sexual activity: Not on file  Other Topics Concern  . Not on file  Social History Narrative   Lives with 8 year old son   She is on disability due to depression   In the past she has worked as a Education officer, community for [employer] [place]   Former Garment/textile technologist  officer/drug counselor   Completed bachelor's degree   Divorced.   Enjoys television    Past Surgical History:  Procedure Laterality Date  . CESAREAN SECTION  1995  . CYSTECTOMY  1998   "thyroglycol duct cyst in neck" had dysphagia, was told benign  . GASTRIC BYPASS  2003  . MICRODISCECTOMY LUMBAR  09/11/14   L3-L4  Dr Saintclair Halsted  . PILONIDAL CYST DRAINAGE  1982  . REDUCTION MAMMAPLASTY    . SP CHOLECYSTOMY  1986    Family History  Adopted: Yes  Problem Relation Age of Onset  . Aneurysm Father   . Alcohol abuse Father   . Alcohol abuse Brother   . Alcohol abuse Brother   . Cancer Brother        prostate  . Alcohol abuse Brother   . Lung cancer Sister   . Cancer Sister        lung  . Thyroid disease Neg Hx   . Diabetes Neg Hx   . Hypertension Neg Hx     No Known Allergies  Current Outpatient Medications on File Prior to Visit  Medication Sig Dispense Refill  . buPROPion (WELLBUTRIN) 100 MG tablet Take 2 tablets (200 mg total) by mouth 2 (two) times daily. 360 tablet 0  . cholecalciferol (VITAMIN D) 1000  UNITS tablet Take 5,000 Units by mouth 3 (three) times a week.     . Cyanocobalamin 2500 MCG SUBL Place 1 tablet (2,500 mcg total) under the tongue 3 (three) times a week.  0  . Flaxseed, Linseed, (FLAX SEED OIL PO) Take by mouth 3 (three) times a week.     . hyoscyamine (LEVBID) 0.375 MG 12 hr tablet Take 0.375 mg by mouth daily.  0  . lisinopril (PRINIVIL,ZESTRIL) 10 MG tablet TAKE 1 TABLET BY MOUTH ONCE DAILY 90 tablet 1  . lithium carbonate (LITHOBID) 300 MG CR tablet Take 1 tablet (300 mg total) by mouth 2 (two) times daily. 180 tablet 0  . Multiple Vitamin (MULTIVITAMIN) tablet Take 1 tablet by mouth 3 (three) times a week.     Marland Kitchen PARoxetine (PAXIL) 30 MG tablet Take 1 tablet (30 mg total) by mouth every morning. 90 tablet 0   No current facility-administered medications on file prior to visit.     BP 114/70 (BP Location: Right Arm, Cuff Size: Large)   Pulse 80   Temp 98 F (36.7 C) (Oral)   Resp 18   Ht 5\' 4"  (1.626 m)   Wt 263 lb 3.2 oz (119.4 kg)   LMP 09/11/2013   SpO2 100%   BMI 45.18 kg/m     Objective:   Physical Exam  Constitutional: She is oriented to person, place, and time. She appears well-developed and well-nourished.  Cardiovascular: Normal rate, regular rhythm and normal heart sounds.  No murmur heard. Pulmonary/Chest: Effort normal and breath sounds normal. No respiratory distress. She has no wheezes.  Musculoskeletal: She exhibits no edema.  Neurological: She is alert and oriented to person, place, and time.  Psychiatric: Her behavior is normal. Judgment and thought content normal.  Slightly flat affect          Assessment & Plan:  Depression- still uncontrolled but she is encouraged by her sobriety and feels like her mood is beginning to improve.  Management per psychiatry.  HTN- bp stable on lisinopril, continue same, obtain follow up bmet.   Asthma- symptoms stable.  Monitor.   Alcohol abuse- encouraged her to maintain her sobriety.

## 2018-02-28 ENCOUNTER — Ambulatory Visit (HOSPITAL_COMMUNITY): Payer: Self-pay | Admitting: Psychiatry

## 2018-03-08 ENCOUNTER — Encounter (HOSPITAL_COMMUNITY): Payer: Self-pay | Admitting: Psychiatry

## 2018-03-08 ENCOUNTER — Ambulatory Visit (INDEPENDENT_AMBULATORY_CARE_PROVIDER_SITE_OTHER): Payer: Medicare Other | Admitting: Psychiatry

## 2018-03-08 DIAGNOSIS — F1020 Alcohol dependence, uncomplicated: Secondary | ICD-10-CM

## 2018-03-08 DIAGNOSIS — F3130 Bipolar disorder, current episode depressed, mild or moderate severity, unspecified: Secondary | ICD-10-CM | POA: Diagnosis not present

## 2018-03-08 DIAGNOSIS — F4010 Social phobia, unspecified: Secondary | ICD-10-CM | POA: Diagnosis not present

## 2018-03-08 DIAGNOSIS — F5102 Adjustment insomnia: Secondary | ICD-10-CM | POA: Diagnosis not present

## 2018-03-08 DIAGNOSIS — F411 Generalized anxiety disorder: Secondary | ICD-10-CM | POA: Diagnosis not present

## 2018-03-08 DIAGNOSIS — F332 Major depressive disorder, recurrent severe without psychotic features: Secondary | ICD-10-CM | POA: Diagnosis not present

## 2018-03-08 MED ORDER — LITHIUM CARBONATE ER 300 MG PO TBCR: 300.0000 mg | EXTENDED_RELEASE_TABLET | Freq: Two times a day (BID) | ORAL | 0 refills | Status: DC

## 2018-03-08 MED ORDER — BUPROPION HCL 100 MG PO TABS: 200.0000 mg | ORAL_TABLET | Freq: Two times a day (BID) | ORAL | 0 refills | Status: DC

## 2018-03-08 MED ORDER — PAROXETINE HCL 30 MG PO TABS: 30.0000 mg | ORAL_TABLET | Freq: Every morning | ORAL | 0 refills | Status: DC

## 2018-03-08 NOTE — Progress Notes (Signed)
Patient ID: Julie Powell, female   DOB: [birth year], 57 y.o.   MRN: [id number] [place] Follow-up Outpatient Visit  Ronnett Pullin [birth year]   Date: 03/08/2018 Chief Complaint  Patient presents with  . Follow-up  . Medication Refill    HPI Comments: Ms. Chilton is a 56  Y/O woman with a past psychiatric history Major Depressive Disorder, severe, recurrent. Mood disorder due to [facility], Alcohol use disorder and Social Phobia.  The patient is referred for psychiatric services for  medication management.    Doing fair. Stopped drinking more alert   Past level Mood better Last Lithium level is 0.5 TSH is normal basic chemistry is normal Feels meds are doing what they can.   . Duration: since age 32 . Timing: more so in evening  . Context: alcohol use history\ . Modifying factors: meds, son . Associated signs and symptoms: Denies psychosis   Traumatic Brain Injury: No none   Review of Systems  Constitutional: Negative for fever.  Cardiovascular: Negative for chest pain.  Gastrointestinal: Negative for nausea.  Skin: Negative for itching.  Neurological: Negative for tremors and headaches.  Psychiatric/Behavioral: Negative for substance abuse and suicidal ideas.   There were no vitals filed for this visit. Physical Exam  Constitutional: She appears well-developed and well-nourished. No distress.  Skin: She is not diaphoretic.  No tremors of hands noted while patient was interviewed.     Past Medical History: Reviewed  Past Medical History:  Diagnosis Date  . Anxiety   . Asthma   . B12 deficiency   . Bipolar 1 disorder ([place]) 2010   ect  . Fibroids   . Hypertension   . Plantar fasciitis 2013  . Sciatica    RT hip  . Vitamin D deficiency     Allergies: No Known Allergies   Current Medications: Reviewed   Current Outpatient Medications on File Prior to Visit  Medication Sig Dispense Refill  . albuterol (VENTOLIN HFA) 108 (90 Base) MCG/ACT inhaler  Inhale 2 puffs into the lungs every 6 (six) hours as needed for wheezing or shortness of breath. 3 Inhaler 1  . cholecalciferol (VITAMIN D) 1000 UNITS tablet Take 5,000 Units by mouth 3 (three) times a week.     . Cyanocobalamin 2500 MCG SUBL Place 1 tablet (2,500 mcg total) under the tongue 3 (three) times a week.  0  . Flaxseed, Linseed, (FLAX SEED OIL PO) Take by mouth 3 (three) times a week.     . hyoscyamine (LEVBID) 0.375 MG 12 hr tablet Take 0.375 mg by mouth daily.  0  . lisinopril (PRINIVIL,ZESTRIL) 10 MG tablet TAKE 1 TABLET BY MOUTH ONCE DAILY 90 tablet 1  . Multiple Vitamin (MULTIVITAMIN) tablet Take 1 tablet by mouth 3 (three) times a week.      No current facility-administered medications on file prior to visit.       Family History: Reviewed  Family History   Problem  Relation  Age of Onset   .  Adopted: Yes   .  Aneurysm  Father     Psychiatric Specialty Exam:  Objective: Appearance: Well Groomed   Eye Contact:: Good   Speech: Clear and Coherent and Normal Rate   Volume: Normal   Mood: better  Affect: congruent  Thought Process: Goal Directed, Logical and Loose   Orientation: Full   Thought Content: WDL   Suicidal Thoughts: Patient denies   Homicidal Thoughts: Patient denies   Judgement: Fair   Insight: Fair   Psychomotor  Activity: Normal   Akathisia: No   Handed: Right   Memory: Immediate 3/3, Recent: 1/3   [place] of knowledge-average to above average.  AIMS (if indicated): Not indicated at this time.   Assets: Communication Skills  Desire for Improvement    Assessment:  AXIS I: Major Depressive Disorder, ; GAD: insomnia: Alcohol use disorder Treatment Plan/Recommendations:   Plan of Care:  Provided supportive therapy. Reviewed side effects . Sent prescriptions.       .   Medications:  Continue medications with no change::   Bipolar depression: better. Continue lithium. Will write for level prior next visit Anxiety: better.  Continue paxil Depression: subdued. Continue welllbutrin.  Work on increasing acitivities  Alcohol use: has stopped. Discussed relapse prevention  FU 2-3 months or earlier if needed,. Refills sent         Merian Capron, M.D.  03/08/2018 1:01 PM

## 2018-03-21 ENCOUNTER — Other Ambulatory Visit: Payer: Self-pay | Admitting: Family

## 2018-03-21 DIAGNOSIS — I10 Essential (primary) hypertension: Secondary | ICD-10-CM

## 2018-03-21 MED ORDER — LISINOPRIL 10 MG PO TABS: 10.0000 mg | ORAL_TABLET | Freq: Every day | ORAL | 0 refills | Status: DC

## 2018-03-21 NOTE — Telephone Encounter (Signed)
Copied from [place] [id number]. Topic: Quick Communication - Rx Refill/Question >> Mar 21, 2018 10:03 AM Ether Griffins B wrote: Medication: lisinopril (PRINIVIL,ZESTRIL) 10 MG tablet  90 day supply Has the patient contacted their pharmacy? Yes.   (Agent: If no, request that the patient contact the pharmacy for the refill.) Preferred Pharmacy (with phone number or street name): [id number] - [place], [place] - [address] Agent: Please be advised that RX refills may take up to 3 business days. We ask that you follow-up with your pharmacy.

## 2018-03-21 NOTE — Telephone Encounter (Signed)
Refills sent

## 2018-05-07 ENCOUNTER — Encounter: Payer: Self-pay | Admitting: Family

## 2018-05-07 ENCOUNTER — Ambulatory Visit (INDEPENDENT_AMBULATORY_CARE_PROVIDER_SITE_OTHER): Payer: Medicare Other | Admitting: Family

## 2018-05-07 VITALS — BP 132/65 | HR 71 | Temp 98.6°F | Resp 16 | Ht 64.0 in | Wt 269.5 lb

## 2018-05-07 DIAGNOSIS — Z0000 Encounter for general adult medical examination without abnormal findings: Secondary | ICD-10-CM

## 2018-05-07 DIAGNOSIS — J45909 Unspecified asthma, uncomplicated: Secondary | ICD-10-CM

## 2018-05-07 DIAGNOSIS — E348 Other specified endocrine disorders: Secondary | ICD-10-CM

## 2018-05-07 DIAGNOSIS — Z13820 Encounter for screening for osteoporosis: Secondary | ICD-10-CM

## 2018-05-07 DIAGNOSIS — F32A Depression, unspecified: Secondary | ICD-10-CM

## 2018-05-07 DIAGNOSIS — I10 Essential (primary) hypertension: Secondary | ICD-10-CM

## 2018-05-07 DIAGNOSIS — Z1239 Encounter for other screening for malignant neoplasm of breast: Secondary | ICD-10-CM

## 2018-05-07 DIAGNOSIS — F329 Major depressive disorder, single episode, unspecified: Secondary | ICD-10-CM

## 2018-05-07 NOTE — Progress Notes (Signed)
Subjective:    Patient ID: Julie Powell, female    DOB: [birth year], 57 y.o.   MRN: [id number]  HPI   [name] is a 57 yr old female who presents today for cpx.  Patient presents today for complete physical.  Immunizations: tetanus 2016 Diet: reports portions are too big Exercise:  Walks 3 times a week Colonoscopy: 1/15 Dexa: 2016- normal.  Pap Smear: 04/26/16 Mammogram: 5/18  HTN-  BP Readings from Last 3 Encounters:  05/07/18 132/65  02/05/18 114/70  11/22/17 129/81   Depression- stable.   Asthma- using albuterol prn.    Review of Systems See HPI  Past Medical History:  Diagnosis Date  . Anxiety   . Asthma   . B12 deficiency   . Bipolar 1 disorder ([place]) 2010   ect  . Fibroids   . Hypertension   . Plantar fasciitis 2013  . Sciatica    RT hip  . Vitamin D deficiency      Social History   Socioeconomic History  . Marital status: Divorced    Spouse name: Not on file  . Number of children: Not on file  . Years of education: Not on file  . Highest education level: Not on file  Occupational History  . Not on file  Social Needs  . Financial resource strain: Not on file  . Food insecurity:    Worry: Not on file    Inability: Not on file  . Transportation needs:    Medical: Not on file    Non-medical: Not on file  Tobacco Use  . Smoking status: Former Smoker    Packs/day: 0.50    Years: 10.00    Pack years: 5.00    Types: Cigarettes    Last attempt to quit: 11/07/1991    Years since quitting: 26.5  . Smokeless tobacco: Never Used  Substance and Sexual Activity  . Alcohol use: Not Currently    Alcohol/week: 16.8 oz    Types: 28 Glasses of wine per week    Comment: Pt stopped on 02/01/18  . Drug use: Yes    Types: "Crack" cocaine    Comment: Caffeine- Carbonated beverage 16 ounces.  . Sexual activity: Never    Birth control/protection: Post-menopausal  Lifestyle  . Physical activity:    Days per week: Not on file    Minutes per session: Not  on file  . Stress: Not on file  Relationships  . Social connections:    Talks on phone: Not on file    Gets together: Not on file    Attends religious service: Not on file    Active member of club or organization: Not on file    Attends meetings of clubs or organizations: Not on file    Relationship status: Not on file  . Intimate partner violence:    Fear of current or ex partner: Not on file    Emotionally abused: Not on file    Physically abused: Not on file    Forced sexual activity: Not on file  Other Topics Concern  . 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   Enjoys television    Past Surgical History:  Procedure Laterality Date  .  CESAREAN SECTION  1995  . CYSTECTOMY  1998   "thyroglycol duct cyst in neck" had dysphagia, was told benign  . GASTRIC BYPASS  2003  . MICRODISCECTOMY LUMBAR  09/11/14   L3-L4  Dr Saintclair Halsted  . PILONIDAL CYST DRAINAGE  1982  . REDUCTION MAMMAPLASTY    . SP CHOLECYSTOMY  1986    Family History  Adopted: Yes  Problem Relation Age of Onset  . Aneurysm Father   . Alcohol abuse Father   . Alcohol abuse Brother   . Alcohol abuse Brother   . Cancer Brother        prostate  . Alcohol abuse Brother   . Lung cancer Sister   . Cancer Sister        lung  . Thyroid disease Neg Hx   . Diabetes Neg Hx   . Hypertension Neg Hx     No Known Allergies  Current Outpatient Medications on File Prior to Visit  Medication Sig Dispense Refill  . albuterol (VENTOLIN HFA) 108 (90 Base) MCG/ACT inhaler Inhale 2 puffs into the lungs every 6 (six) hours as needed for wheezing or shortness of breath. 3 Inhaler 1  . buPROPion (WELLBUTRIN) 100 MG tablet Take 2 tablets (200 mg total) by mouth 2 (two) times  daily. 360 tablet 0  . cholecalciferol (VITAMIN D) 1000 UNITS tablet Take 5,000 Units by mouth 3 (three) times a week.     . Cyanocobalamin 2500 MCG SUBL Place 1 tablet (2,500 mcg total) under the tongue 3 (three) times a week.  0  . Flaxseed, Linseed, (FLAX SEED OIL PO) Take by mouth 3 (three) times a week.     . hyoscyamine (LEVBID) 0.375 MG 12 hr tablet Take 0.375 mg by mouth daily.  0  . lisinopril (PRINIVIL,ZESTRIL) 10 MG tablet Take 1 tablet (10 mg total) by mouth daily. 90 tablet 0  . lithium carbonate (LITHOBID) 300 MG CR tablet Take 1 tablet (300 mg total) by mouth 2 (two) times daily. 180 tablet 0  . Multiple Vitamin (MULTIVITAMIN) tablet Take 1 tablet by mouth 3 (three) times a week.     Marland Kitchen PARoxetine (PAXIL) 30 MG tablet Take 1 tablet (30 mg total) by mouth every morning. 90 tablet 0   No current facility-administered medications on file prior to visit.     BP 132/65 (BP Location: Right Arm, Patient Position: Sitting, Cuff Size: Large)   Pulse 71   Temp 98.6 F (37 C) (Oral)   Resp 16   Ht 5\' 4"  (1.626 m)   Wt 269 lb 8 oz (122.2 kg)   LMP 09/11/2013   SpO2 99%   BMI 46.26 kg/m       Objective:   Physical Exam  Constitutional: She is oriented to person, place, and time. She appears well-developed and well-nourished.  Cardiovascular: Normal rate, regular rhythm and normal heart sounds.  No murmur heard. Pulmonary/Chest: Effort normal and breath sounds normal. No respiratory distress. She has no wheezes.  Neurological: She is alert and oriented to person, place, and time.  Skin: Skin is warm.  Psychiatric: She has a normal mood and affect. Her behavior is normal. Judgment and thought content normal.          Assessment & Plan:  HTN- bp stable. Continue current meds.  Depression- reports stable control, this is being managed by psychiatry.  Asthma- stable with prn use of albuterol.

## 2018-05-07 NOTE — Patient Instructions (Signed)
Please schedule mammogram on the first floor.

## 2018-05-14 ENCOUNTER — Ambulatory Visit (HOSPITAL_BASED_OUTPATIENT_CLINIC_OR_DEPARTMENT_OTHER)
Admission: RE | Admit: 2018-05-14 | Discharge: 2018-05-14 | Disposition: A | Discharge status: Home / Self Care | Payer: Medicare Other | Source: Ambulatory Visit | Attending: Family | Admitting: Family

## 2018-05-14 ENCOUNTER — Encounter (HOSPITAL_BASED_OUTPATIENT_CLINIC_OR_DEPARTMENT_OTHER): Payer: Self-pay

## 2018-05-14 DIAGNOSIS — Z1239 Encounter for other screening for malignant neoplasm of breast: Secondary | ICD-10-CM

## 2018-05-14 DIAGNOSIS — Z1231 Encounter for screening mammogram for malignant neoplasm of breast: Secondary | ICD-10-CM | POA: Diagnosis present

## 2018-05-29 DIAGNOSIS — Z1211 Encounter for screening for malignant neoplasm of colon: Secondary | ICD-10-CM | POA: Diagnosis not present

## 2018-05-29 DIAGNOSIS — Z9884 Bariatric surgery status: Secondary | ICD-10-CM | POA: Diagnosis not present

## 2018-05-29 DIAGNOSIS — R1013 Epigastric pain: Secondary | ICD-10-CM | POA: Diagnosis not present

## 2018-06-06 ENCOUNTER — Ambulatory Visit (HOSPITAL_COMMUNITY): Payer: Medicare Other | Admitting: Psychiatry

## 2018-06-07 ENCOUNTER — Other Ambulatory Visit: Payer: Self-pay | Admitting: Family

## 2018-06-07 ENCOUNTER — Other Ambulatory Visit (HOSPITAL_COMMUNITY): Payer: Self-pay | Admitting: Psychiatry

## 2018-06-07 DIAGNOSIS — I10 Essential (primary) hypertension: Secondary | ICD-10-CM

## 2018-06-07 DIAGNOSIS — F332 Major depressive disorder, recurrent severe without psychotic features: Secondary | ICD-10-CM

## 2018-06-13 ENCOUNTER — Ambulatory Visit (INDEPENDENT_AMBULATORY_CARE_PROVIDER_SITE_OTHER): Payer: Medicare Other | Admitting: Psychiatry

## 2018-06-13 ENCOUNTER — Encounter (HOSPITAL_COMMUNITY): Payer: Self-pay | Admitting: Psychiatry

## 2018-06-13 DIAGNOSIS — F1020 Alcohol dependence, uncomplicated: Secondary | ICD-10-CM | POA: Diagnosis not present

## 2018-06-13 DIAGNOSIS — F332 Major depressive disorder, recurrent severe without psychotic features: Secondary | ICD-10-CM | POA: Diagnosis not present

## 2018-06-13 DIAGNOSIS — F4010 Social phobia, unspecified: Secondary | ICD-10-CM

## 2018-06-13 DIAGNOSIS — F3130 Bipolar disorder, current episode depressed, mild or moderate severity, unspecified: Secondary | ICD-10-CM | POA: Diagnosis not present

## 2018-06-13 NOTE — Progress Notes (Signed)
Patient ID: Earlyn Sylvan, female   DOB: Feb 04, 1961, 57 y.o.   MRN: [id number] [place] Follow-up Outpatient Visit  Mishelle Hassan [birth year]   Date: 03/08/2018 No chief complaint on file.   HPI Comments: Ms. Rumer is a 57  Y/O woman with a past psychiatric history Major Depressive Disorder, severe, recurrent. Mood disorder due to [facility], Alcohol use disorder and Social Phobia.  The patient is referred for psychiatric services for  medication management.    Doing fair. Has stopped alcohol last visit but says using beer here and there  depression stable  lithium levels not done, forgot so plans to do next week   Last Lithium level is 0.5 TSH is normal basic chemistry is normal Feels meds are doing what they can.   . Duration: since age 57 . Timing: more so in evening  . Context: alcohol use history\ . Modifying factors: meds, son . Associated signs and symptoms: Denies psychosis   Traumatic Brain Injury: No none   Review of Systems  Constitutional: Negative for fever.  Cardiovascular: Negative for palpitations.  Gastrointestinal: Negative for nausea.  Skin: Negative for itching.  Neurological: Negative for tremors and headaches.  Psychiatric/Behavioral: Negative for substance abuse and suicidal ideas.   There were no vitals filed for this visit. Physical Exam  Constitutional: She appears well-developed and well-nourished. No distress.  Skin: She is not diaphoretic.  No tremors of hands noted while patient was interviewed.     Past Medical History: Reviewed  Past Medical History:  Diagnosis Date  . Anxiety   . Asthma   . B12 deficiency   . Bipolar 1 disorder ([place]) 2010   ect  . Fibroids   . Hypertension   . Plantar fasciitis 2013  . Sciatica    RT hip  . Vitamin D deficiency     Allergies: No Known Allergies   Current Medications: Reviewed   Current Outpatient Medications on File Prior to Visit  Medication Sig Dispense Refill  . albuterol  (VENTOLIN HFA) 108 (90 Base) MCG/ACT inhaler Inhale 2 puffs into the lungs every 6 (six) hours as needed for wheezing or shortness of breath. 3 Inhaler 1  . buPROPion (WELLBUTRIN) 100 MG tablet TAKE 2 TABLETS BY MOUTH TWICE DAILY 360 tablet 0  . cholecalciferol (VITAMIN D) 1000 UNITS tablet Take 5,000 Units by mouth 3 (three) times a week.     . Cyanocobalamin 2500 MCG SUBL Place 1 tablet (2,500 mcg total) under the tongue 3 (three) times a week.  0  . Flaxseed, Linseed, (FLAX SEED OIL PO) Take by mouth 3 (three) times a week.     . hyoscyamine (LEVBID) 0.375 MG 12 hr tablet Take 0.375 mg by mouth daily.  0  . lisinopril (PRINIVIL,ZESTRIL) 10 MG tablet TAKE 1 TABLET(10 MG) BY MOUTH DAILY 90 tablet 1  . lithium carbonate (LITHOBID) 300 MG CR tablet TAKE 1 TABLET BY MOUTH TWICE DAILY 180 tablet 0  . Multiple Vitamin (MULTIVITAMIN) tablet Take 1 tablet by mouth 3 (three) times a week.     Marland Kitchen PARoxetine (PAXIL) 30 MG tablet TAKE 1 TABLET BY MOUTH EVERY MORNING 90 tablet 0   No current facility-administered medications on file prior to visit.       Family History: Reviewed  Family History   Problem  Relation  Age of Onset   .  Adopted: Yes   .  Aneurysm  Father     Psychiatric Specialty Exam:  Objective: Appearance: Well Groomed   Eye  Contact:: Good   Speech: Clear and Coherent and Normal Rate   Volume: Normal   Mood: fair  Affect: congruent  Thought Process: Goal Directed, Logical and Loose   Orientation: Full   Thought Content: WDL   Suicidal Thoughts: Patient denies   Homicidal Thoughts: Patient denies   Judgement: Fair   Insight: Fair   Psychomotor Activity: Normal   Akathisia: No   Handed: Right   Memory: Immediate 3/3, Recent: 1/3   [place] of knowledge-average to above average.  AIMS (if indicated): Not indicated at this time.   Assets: Communication Skills  Desire for Improvement    Assessment:  AXIS I: Major Depressive Disorder, ; GAD: insomnia:  Alcohol use disorder Treatment Plan/Recommendations:   Plan of Care:  Provided supportive therapy. Reviewed side effects . Sent prescriptions.       .   Medications:  Continue medications with no change::   Bipolar depression: doing fair. Continue lithium Anxiety: better. Continue paxil Depression: fair. Continue welllbutrin.  Work on increasing acitivities  Discussed abstinence from alcohol  Fu 88m         Merian Capron, M.D.  06/13/2018 1:19 PM

## 2018-09-04 ENCOUNTER — Other Ambulatory Visit (HOSPITAL_COMMUNITY): Payer: Self-pay | Admitting: Psychiatry

## 2018-09-04 DIAGNOSIS — F332 Major depressive disorder, recurrent severe without psychotic features: Secondary | ICD-10-CM

## 2018-10-11 DIAGNOSIS — F3130 Bipolar disorder, current episode depressed, mild or moderate severity, unspecified: Secondary | ICD-10-CM | POA: Diagnosis not present

## 2018-10-12 LAB — LITHIUM LEVEL: Lithium Lvl: 0.6 mmol/L (ref 0.6–1.2)

## 2018-10-17 ENCOUNTER — Ambulatory Visit (HOSPITAL_COMMUNITY): Payer: Self-pay | Admitting: Psychiatry

## 2018-10-22 ENCOUNTER — Other Ambulatory Visit: Payer: Self-pay

## 2018-10-22 ENCOUNTER — Ambulatory Visit (INDEPENDENT_AMBULATORY_CARE_PROVIDER_SITE_OTHER): Payer: Medicare Other | Admitting: Psychiatry

## 2018-10-22 ENCOUNTER — Encounter (HOSPITAL_COMMUNITY): Payer: Self-pay | Admitting: Psychiatry

## 2018-10-22 VITALS — BP 130/90 | HR 69 | Ht 64.0 in | Wt 273.0 lb

## 2018-10-22 DIAGNOSIS — F1020 Alcohol dependence, uncomplicated: Secondary | ICD-10-CM | POA: Diagnosis not present

## 2018-10-22 DIAGNOSIS — F3130 Bipolar disorder, current episode depressed, mild or moderate severity, unspecified: Secondary | ICD-10-CM | POA: Diagnosis not present

## 2018-10-22 DIAGNOSIS — F4010 Social phobia, unspecified: Secondary | ICD-10-CM

## 2018-10-22 DIAGNOSIS — F332 Major depressive disorder, recurrent severe without psychotic features: Secondary | ICD-10-CM

## 2018-10-22 MED ORDER — PAROXETINE HCL 30 MG PO TABS: 30.0000 mg | ORAL_TABLET | Freq: Every morning | ORAL | 0 refills | Status: DC

## 2018-10-22 MED ORDER — LITHIUM CARBONATE ER 300 MG PO TBCR: 300.0000 mg | EXTENDED_RELEASE_TABLET | Freq: Two times a day (BID) | ORAL | 0 refills | Status: DC

## 2018-10-22 MED ORDER — BUPROPION HCL 100 MG PO TABS: 200.0000 mg | ORAL_TABLET | Freq: Two times a day (BID) | ORAL | 0 refills | Status: DC

## 2018-10-22 NOTE — Progress Notes (Signed)
Patient ID: Julie Powell, female   DOB: 15-Feb-1961, 57 y.o.   MRN: [id number] [facility] Follow-up Outpatient Visit  Julie Powell 08/31/1961   Date:10/12/2018 Chief Complaint  Patient presents with  . Follow-up  . Other    Bipolar I disorder, most recent episode depressed    HPI Comments: Julie Powell is a 57  Y/O woman with a past psychiatric history Major Depressive Disorder, severe, recurrent. Mood disorder due to [facility], Alcohol use disorder and Social Phobia.  The patient is referred for psychiatric services for  medication management.   Apparently she has continued drinking last up to 3 weeks ago in the morning she fell down her foot with feeling dizzy.  She has not gone to the ED so recommended that she goes to the ED today she is not feeling dizzy but there is still some soreness on her foot  She stopped drinking since then and is feeling more alert less depressed she feels the medication is helping she wants to keep her weight she understands she should attend AA or other support.  Continue work on alcohol  Depression is better.  Lithium level 0.6    . Duration: since young age . Timing: more so in evening  . Context: alcohol use history\ . Modifying factors: meds, son . Associated signs and symptoms: Denies psychosis   Traumatic Brain Injury: No none   Review of Systems  Constitutional: Negative for fever.  Cardiovascular: Negative for palpitations.  Gastrointestinal: Negative for nausea.  Skin: Negative for itching.  Neurological: Negative for tremors and headaches.  Psychiatric/Behavioral: Negative for substance abuse and suicidal ideas.   Vitals:   10/22/18 1115  BP: 130/90  Pulse: 69  Weight: 273 lb (123.8 kg)  Height: 5\' 4"  (1.626 m)   Physical Exam  Constitutional: She appears well-developed and well-nourished. No distress.  Skin: She is not diaphoretic.  No tremors of hands noted while patient was interviewed.     Past Medical  History: Reviewed  Past Medical History:  Diagnosis Date  . Anxiety   . Asthma   . B12 deficiency   . Bipolar 1 disorder ([place]) 2010   ect  . Fibroids   . Hypertension   . Plantar fasciitis 2013  . Sciatica    RT hip  . Vitamin D deficiency     Allergies: No Known Allergies   Current Medications: Reviewed   Current Outpatient Medications on File Prior to Visit  Medication Sig Dispense Refill  . albuterol (VENTOLIN HFA) 108 (90 Base) MCG/ACT inhaler Inhale 2 puffs into the lungs every 6 (six) hours as needed for wheezing or shortness of breath. 3 Inhaler 1  . cholecalciferol (VITAMIN D) 1000 UNITS tablet Take 5,000 Units by mouth 3 (three) times a week.     . Cyanocobalamin 2500 MCG SUBL Place 1 tablet (2,500 mcg total) under the tongue 3 (three) times a week.  0  . Flaxseed, Linseed, (FLAX SEED OIL PO) Take by mouth 3 (three) times a week.     . hyoscyamine (LEVBID) 0.375 MG 12 hr tablet Take 0.375 mg by mouth daily.  0  . lisinopril (PRINIVIL,ZESTRIL) 10 MG tablet TAKE 1 TABLET(10 MG) BY MOUTH DAILY 90 tablet 1  . Multiple Vitamin (MULTIVITAMIN) tablet Take 1 tablet by mouth 3 (three) times a week.      No current facility-administered medications on file prior to visit.       Family History: Reviewed  Family History   Problem  Relation  Age of Onset   .  Adopted: Yes   .  Aneurysm  Father     Psychiatric Specialty Exam:  Objective: Appearance: Well Groomed   Eye Contact:: Good   Speech: Clear and Coherent and Normal Rate   Volume: Normal   Mood fair  Affect: congruent  Thought Process: Goal Directed, Logical and Loose   Orientation: Full   Thought Content: WDL   Suicidal Thoughts: Patient denies   Homicidal Thoughts: Patient denies   Judgement: Fair   Insight: Fair   Psychomotor Activity: Normal   Akathisia: No   Handed: Right   Memory: Immediate 3/3, Recent: 1/3   [place] of knowledge-average to above average.  AIMS (if indicated): Not  indicated at this time.   Assets: Communication Skills  Desire for Improvement    Assessment:  AXIS I: Major Depressive Disorder, ; GAD: insomnia: Alcohol use disorder Treatment Plan/Recommendations:   Plan of Care:  Provided supportive therapy. Reviewed side effects . Sent prescriptions.       .   Medications:  Continue medications with no change::   Bipolar depression:doing fair more alert since stopped alcohol Anxiety: manageable. Continue paxil Vitals reviewed. Pulse 69 Depression: fair. Continue welllbutrin.  Work on Energy manager Attend [place] or other suppot groups Recommend she visit ED to get her feet examined due to injury 2 weeks ago.  Discussed to remain abstinence from alcohol  Fu 46m. She wants to come in 26m.          Merian Capron, M.D.  10/22/2018 11:48 AM

## 2018-10-29 ENCOUNTER — Ambulatory Visit (INDEPENDENT_AMBULATORY_CARE_PROVIDER_SITE_OTHER): Payer: Medicare Other

## 2018-10-29 DIAGNOSIS — Z23 Encounter for immunization: Secondary | ICD-10-CM | POA: Diagnosis not present

## 2018-10-29 NOTE — Addendum Note (Signed)
Addended by: Bunnie Domino on: 10/29/2018 10:58 AM   Modules accepted: Orders

## 2018-11-22 NOTE — Progress Notes (Signed)
Subjective:   Julie Powell is a 58 y.o. female who presents for Medicare Annual (Subsequent) preventive examination.  Review of Systems: No ROS.  Medicare Wellness Visit. Additional risk factors are reflected in the social history. Cardiac Risk Factors include: obesity (BMI >30kg/m2);sedentary lifestyle;hypertension;dyslipidemia Sleep patterns: no issues Home Safety/Smoke Alarms: Feels safe in home. Smoke alarms in place.  Lives alone in apt. Son visits 4-5x/wk  Female:   Pap-04/2016       Mammo- 05/14/18       Dexa scan-   04/03/17   CCS- pt reported 11/07/13-normal     Objective:     Vitals: LMP 09/11/2013   There is no height or weight on file to calculate BMI.  Advanced Directives 11/25/2018 11/22/2017 10/17/2016 12/29/2014 08/06/2014  Does Patient Have a Medical Advance Directive? No No No No No  Does patient want to make changes to medical advance directive? - - No - Patient declined - -  Would patient like information on creating a medical advance directive? No - Patient declined No - Patient declined - No - patient declined information No - patient declined information    Tobacco Social History   Tobacco Use  Smoking Status Former Smoker  . Packs/day: 0.50  . Years: 10.00  . Pack years: 5.00  . Types: Cigarettes  . Last attempt to quit: 11/07/1991  . Years since quitting: 27.0  Smokeless Tobacco Never Used     Counseling given: Not Answered   Clinical Intake:                       Past Medical History:  Diagnosis Date  . Anxiety   . Asthma   . B12 deficiency   . Bipolar 1 disorder ([place]) 2010   ect  . Fibroids   . Hypertension   . Plantar fasciitis 2013  . Sciatica    RT hip  . Vitamin D deficiency    Past Surgical History:  Procedure Laterality Date  . CESAREAN SECTION  1995  . CYSTECTOMY  1998   "thyroglycol duct cyst in neck" had dysphagia, was told benign  . GASTRIC BYPASS  2003  . MICRODISCECTOMY LUMBAR  09/11/14   L3-L4  Dr Saintclair Halsted    . PILONIDAL CYST DRAINAGE  1982  . REDUCTION MAMMAPLASTY    . SP CHOLECYSTOMY  1986   Family History  Adopted: Yes  Problem Relation Age of Onset  . Aneurysm Father   . Alcohol abuse Father   . Alcohol abuse Brother   . Alcohol abuse Brother   . Cancer Brother        prostate  . Alcohol abuse Brother   . Lung cancer Sister   . Cancer Sister        lung  . Thyroid disease Neg Hx   . Diabetes Neg Hx   . Hypertension Neg Hx    Social History   Socioeconomic History  . Marital status: Divorced    Spouse name: Not on file  . Number of children: Not on file  . Years of education: Not on file  . Highest education level: Not on file  Occupational History  . Not on file  Social Needs  . Financial resource strain: Not on file  . Food insecurity:    Worry: Not on file    Inability: Not on file  . Transportation needs:    Medical: Not on file    Non-medical: Not on file  Tobacco Use  .  Smoking status: Former Smoker    Packs/day: 0.50    Years: 10.00    Pack years: 5.00    Types: Cigarettes    Last attempt to quit: 11/07/1991    Years since quitting: 27.0  . Smokeless tobacco: Never Used  Substance and Sexual Activity  . Alcohol use: Not Currently    Alcohol/week: 0.0 standard drinks    Comment: stopped wine. Now beer. 12 per day.   . Drug use: Yes    Types: "Crack" cocaine    Comment: Caffeine- Carbonated beverage 16 ounces.  . Sexual activity: Never    Birth control/protection: Post-menopausal  Lifestyle  . Physical activity:    Days per week: Not on file    Minutes per session: Not on file  . Stress: Not on file  Relationships  . Social connections:    Talks on phone: Not on file    Gets together: Not on file    Attends religious service: Not on file    Active member of club or organization: Not on file    Attends meetings of clubs or organizations: Not on file    Relationship status: Not on file  Other Topics Concern  . Not on file  Social History  Narrative   Lives with 27 year old son   She is on disability due to depression   In the past she has worked as a Education officer, community for state of [place]   Former Mudlogger   Completed bachelor's degree   Divorced.   Enjoys television    Outpatient Encounter Medications as of 11/25/2018  Medication Sig  . albuterol (VENTOLIN HFA) 108 (90 Base) MCG/ACT inhaler Inhale 2 puffs into the lungs every 6 (six) hours as needed for wheezing or shortness of breath.  Marland Kitchen buPROPion (WELLBUTRIN) 100 MG tablet Take 2 tablets (200 mg total) by mouth 2 (two) times daily.  . cholecalciferol (VITAMIN D) 1000 UNITS tablet Take 5,000 Units by mouth 3 (three) times a week.   . Cyanocobalamin 2500 MCG SUBL Place 1 tablet (2,500 mcg total) under the tongue 3 (three) times a week.  . Flaxseed, Linseed, (FLAX SEED OIL PO) Take by mouth 3 (three) times a week.   . hyoscyamine (LEVBID) 0.375 MG 12 hr tablet Take 0.375 mg by mouth daily.  Marland Kitchen lisinopril (PRINIVIL,ZESTRIL) 10 MG tablet TAKE 1 TABLET(10 MG) BY MOUTH DAILY  . lithium carbonate (LITHOBID) 300 MG CR tablet Take 1 tablet (300 mg total) by mouth 2 (two) times daily.  . Multiple Vitamin (MULTIVITAMIN) tablet Take 1 tablet by mouth 3 (three) times a week.   Marland Kitchen PARoxetine (PAXIL) 30 MG tablet Take 1 tablet (30 mg total) by mouth every morning.  . [DISCONTINUED] albuterol (VENTOLIN HFA) 108 (90 Base) MCG/ACT inhaler Inhale 2 puffs into the lungs every 6 (six) hours as needed for wheezing or shortness of breath.   No facility-administered encounter medications on file as of 11/25/2018.     Activities of Daily Living In your present state of health, do you have any difficulty performing the following activities: 11/25/2018  Hearing? N  Vision? N  Difficulty concentrating or making decisions? N  Walking or climbing stairs? N  Dressing or bathing? N  Doing errands, shopping? N  Preparing Food and eating ? N    Using the Toilet? N  In the past six months, have you accidently leaked urine? N  Do you have problems with loss of bowel control? N  Managing  your Medications? N  Managing your Finances? N  Housekeeping or managing your Housekeeping? N  Some recent data might be hidden    Patient Care Team: [name], NP as PCP - General (Internal Medicine) Merian Capron, MD as Consulting Physician (Psychiatry) Renato Shin, MD as Consulting Physician (Endocrinology) Lucienne Capers, MD as Consulting Physician (Gastroenterology)    Assessment:   This is a routine wellness examination for Betania. Physical assessment deferred to PCP.   Exercise Activities and Dietary recommendations Current Exercise Habits: The patient does not participate in regular exercise at present, Exercise limited by: None identified Diet (meal preparation, eat out, water intake, caffeinated beverages, dairy products, fruits and vegetables): well balanced, on average, 3 meals per day   Goals    . Increase physical activity    . Increase water intake    . Reduce alcohol intake    . Stop drinking so much (pt-stated)       Fall Risk Fall Risk  11/25/2018 11/22/2017 10/17/2016 03/21/2016  Falls in the past year? 1 No No No  Number falls in past yr: 1 - - -  Risk for fall due to : Impaired balance/gait - - -    Depression Screen PHQ 2/9 Scores 11/25/2018 11/25/2018 02/05/2018 11/22/2017  PHQ - 2 Score 4 6 4 6   PHQ- 9 Score 16 18 16 17      Cognitive Function Ad8 score reviewed for issues:  Issues making decisions:no  Less interest in hobbies / activities:no  Repeats questions, stories (family complaining):no  Trouble using ordinary gadgets (microwave, computer, phone):no  Forgets the month or year: no  Mismanaging finances: no  Remembering appts:no  Daily problems with thinking and/or memory:no Ad8 score is=0   MMSE - Mini Mental State Exam 11/22/2017 10/17/2016  Orientation to time 5 5   Orientation to Place 5 5  Registration 3 3  Attention/ Calculation 5 5  Recall 3 2  Language- name 2 objects 2 2  Language- repeat 1 1  Language- follow 3 step command 3 3  Language- read & follow direction 1 1  Write a sentence 1 1  Copy design 1 1  Total score 30 29        Immunization History  Administered Date(s) Administered  . Influenza,inj,Quad PF,6+ Mos 08/12/2014, 09/22/2015, 09/13/2016, 08/07/2017, 10/29/2018  . Pneumococcal Polysaccharide-23 08/29/2011  . Tdap 11/13/2014    Screening Tests Health Maintenance  Topic Date Due  . DEXA SCAN  04/04/2019  . PAP SMEAR-Modifier  04/27/2019  . MAMMOGRAM  05/14/2020  . COLONOSCOPY  11/08/2023  . TETANUS/TDAP  11/13/2024  . INFLUENZA VACCINE  Completed  . Hepatitis C Screening  Completed  . HIV Screening  Completed      Plan:    Please schedule your next medicare wellness visit with me in 1 yr.  Continue to eat heart healthy diet (full of fruits, vegetables, whole grains, lean protein, water--limit salt, fat, and sugar intake) and increase physical activity as tolerated.  Continue doing brain stimulating activities (puzzles, reading, adult coloring books, staying active) to keep memory sharp.     I have personally reviewed and noted the following in the patient's chart:   . Medical and social history . Use of alcohol, tobacco or illicit drugs  . Current medications and supplements . Functional ability and status . Nutritional status . Physical activity . Advanced directives . List of other physicians . Hospitalizations, surgeries, and ER visits in previous 12 months . Vitals . Screenings to include cognitive,  depression, and falls . Referrals and appointments  In addition, I have reviewed and discussed with patient certain preventive protocols, quality metrics, and best practice recommendations. A written personalized care plan for preventive services as well as general preventive health recommendations  were provided to patient.     Shela Nevin, [place]  11/25/2018

## 2018-11-25 ENCOUNTER — Encounter: Payer: Self-pay | Admitting: Family

## 2018-11-25 ENCOUNTER — Ambulatory Visit (INDEPENDENT_AMBULATORY_CARE_PROVIDER_SITE_OTHER): Payer: Medicare Other | Admitting: Family

## 2018-11-25 ENCOUNTER — Encounter: Payer: Self-pay | Admitting: *Deleted

## 2018-11-25 ENCOUNTER — Ambulatory Visit (INDEPENDENT_AMBULATORY_CARE_PROVIDER_SITE_OTHER): Payer: Medicare Other | Admitting: *Deleted

## 2018-11-25 VITALS — BP 126/74 | HR 100 | Ht 64.0 in | Wt 274.0 lb

## 2018-11-25 VITALS — BP 126/74 | HR 100 | Temp 98.1°F | Resp 16 | Ht 64.0 in | Wt 274.0 lb

## 2018-11-25 DIAGNOSIS — I10 Essential (primary) hypertension: Secondary | ICD-10-CM | POA: Diagnosis not present

## 2018-11-25 DIAGNOSIS — F1010 Alcohol abuse, uncomplicated: Secondary | ICD-10-CM | POA: Diagnosis not present

## 2018-11-25 DIAGNOSIS — Z0000 Encounter for general adult medical examination without abnormal findings: Secondary | ICD-10-CM

## 2018-11-25 DIAGNOSIS — J45909 Unspecified asthma, uncomplicated: Secondary | ICD-10-CM

## 2018-11-25 DIAGNOSIS — R4020 Unspecified coma: Secondary | ICD-10-CM | POA: Diagnosis not present

## 2018-11-25 LAB — COMPREHENSIVE METABOLIC PANEL
ALT: 14 U/L (ref 0–35)
AST: 19 U/L (ref 0–37)
Albumin: 4.1 g/dL (ref 3.5–5.2)
Alkaline Phosphatase: 98 U/L (ref 39–117)
BUN: 15 mg/dL (ref 6–23)
CO2: 29 mEq/L (ref 19–32)
Calcium: 9.8 mg/dL (ref 8.4–10.5)
Chloride: 102 mEq/L (ref 96–112)
Creatinine, Ser: 0.79 mg/dL (ref 0.40–1.20)
GFR: 91.34 mL/min (ref >60.00)
Glucose, Bld: 82 mg/dL (ref 70–99)
Potassium: 4.6 mEq/L (ref 3.5–5.1)
Sodium: 139 mEq/L (ref 135–145)
Total Bilirubin: 0.4 mg/dL (ref 0.2–1.2)
Total Protein: 6.6 g/dL (ref 6.0–8.3)

## 2018-11-25 LAB — CBC
HCT: 37.4 % (ref 36.0–46.0)
Hemoglobin: 12.5 g/dL (ref 12.0–15.0)
MCHC: 33.4 g/dL (ref 30.0–36.0)
MCV: 96.1 fl (ref 78.0–100.0)
Platelets: 233 10*3/uL (ref 150.0–400.0)
RBC: 3.89 Mil/uL (ref 3.87–5.11)
RDW: 12.7 % (ref 11.5–15.5)
WBC: 5.2 10*3/uL (ref 4.0–10.5)

## 2018-11-25 LAB — TSH: TSH: 2.21 u[IU]/mL (ref 0.35–4.50)

## 2018-11-25 MED ORDER — ALBUTEROL SULFATE HFA 108 (90 BASE) MCG/ACT IN AERS: 2.0000 | INHALATION_SPRAY | Freq: Four times a day (QID) | RESPIRATORY_TRACT | 1 refills | Status: DC | PRN

## 2018-11-25 NOTE — Progress Notes (Signed)
Subjective:    Patient ID: Julie Powell, female    DOB: 04-May-1961, 58 y.o.   MRN: [id number]  HPI  Patient is a 58 yr old female who presents today for follow up.   HTN- maintained on lisinopril. Denies cough, CP/SOB or swelling.  BP Readings from Last 3 Encounters:  11/25/18 126/74  05/07/18 132/65  02/05/18 114/70   Depression/anxiety- had LOC x  with dry heaves following alcohol.  This occurred about 6 weeks ago.  This was unwitnessed.  She reports that she passed out in the bathroom floor.  She woke up and saw blood.  Noted that she had split in the skin beneath the toes on 1 of her feet which was bleeding.  She wonders if she may have had a seizure.  She has had no further loss of consciousness.  This scared her and she stopped drinking for 1 month following this event.  However now she is back to drinking beer 12 cans/day.    Asthma- reports that she is using her albuterol about 5 times a week.     Review of Systems See HPI  Past Medical History:  Diagnosis Date  . Anxiety   . Asthma   . B12 deficiency   . Bipolar 1 disorder ([place]) 2010   ect  . Fibroids   . Hypertension   . Plantar fasciitis 2013  . Sciatica    RT hip  . Vitamin D deficiency      Social History   Socioeconomic History  . Marital status: Divorced    Spouse name: Not on file  . Number of children: Not on file  . Years of education: Not on file  . Highest education level: Not on file  Occupational History  . Not on file  Social Needs  . Financial resource strain: Not on file  . Food insecurity:    Worry: Not on file    Inability: Not on file  . Transportation needs:    Medical: Not on file    Non-medical: Not on file  Tobacco Use  . Smoking status: Former Smoker    Packs/day: 0.50    Years: 10.00    Pack years: 5.00    Types: Cigarettes    Last attempt to quit: 11/07/1991    Years since quitting: 27.0  . Smokeless tobacco: Never Used  Substance and Sexual Activity  . Alcohol use:  Not Currently    Alcohol/week: 28.0 standard drinks    Types: 28 Glasses of wine per week    Comment: Pt stopped on 02/01/18  . Drug use: Yes    Types: "Crack" cocaine    Comment: Caffeine- Carbonated beverage 16 ounces.  . Sexual activity: Never    Birth control/protection: Post-menopausal  Lifestyle  . Physical activity:    Days per week: Not on file    Minutes per session: Not on file  . Stress: Not on file  Relationships  . Social connections:    Talks on phone: Not on file    Gets together: Not on file    Attends religious service: Not on file    Active member of club or organization: Not on file    Attends meetings of clubs or organizations: Not on file    Relationship status: Not on file  . Intimate partner violence:    Fear of current or ex partner: Not on file    Emotionally abused: Not on file    Physically abused: Not on file  Forced sexual activity: Not on file  Other Topics Concern  . Not on file  Social History Narrative   Lives with 77 year old son   She is on disability due to depression   In the past she has worked as a Education officer, community for [employer] [place]   Former [employer]   Completed bachelor's degree   Divorced.   Enjoys television    Past Surgical History:  Procedure Laterality Date  . CESAREAN SECTION  1995  . CYSTECTOMY  1998   "thyroglycol duct cyst in neck" had dysphagia, was told benign  . GASTRIC BYPASS  2003  . MICRODISCECTOMY LUMBAR  09/11/14   L3-L4  Dr Saintclair Halsted  . PILONIDAL CYST DRAINAGE  1982  . REDUCTION MAMMAPLASTY    . SP CHOLECYSTOMY  1986    Family History  Adopted: Yes  Problem Relation Age of Onset  . Aneurysm Father   . Alcohol abuse Father   . Alcohol abuse Brother   . Alcohol abuse Brother   . Cancer Brother        prostate  . Alcohol abuse Brother   . Lung cancer Sister   . Cancer Sister        lung  . Thyroid disease Neg Hx   . Diabetes Neg Hx   .  Hypertension Neg Hx     No Known Allergies  Current Outpatient Medications on File Prior to Visit  Medication Sig Dispense Refill  . albuterol (VENTOLIN HFA) 108 (90 Base) MCG/ACT inhaler Inhale 2 puffs into the lungs every 6 (six) hours as needed for wheezing or shortness of breath. 3 Inhaler 1  . buPROPion (WELLBUTRIN) 100 MG tablet Take 2 tablets (200 mg total) by mouth 2 (two) times daily. 360 tablet 0  . cholecalciferol (VITAMIN D) 1000 UNITS tablet Take 5,000 Units by mouth 3 (three) times a week.     . Cyanocobalamin 2500 MCG SUBL Place 1 tablet (2,500 mcg total) under the tongue 3 (three) times a week.  0  . Flaxseed, Linseed, (FLAX SEED OIL PO) Take by mouth 3 (three) times a week.     . hyoscyamine (LEVBID) 0.375 MG 12 hr tablet Take 0.375 mg by mouth daily.  0  . lisinopril (PRINIVIL,ZESTRIL) 10 MG tablet TAKE 1 TABLET(10 MG) BY MOUTH DAILY 90 tablet 1  . lithium carbonate (LITHOBID) 300 MG CR tablet Take 1 tablet (300 mg total) by mouth 2 (two) times daily. 180 tablet 0  . Multiple Vitamin (MULTIVITAMIN) tablet Take 1 tablet by mouth 3 (three) times a week.     Marland Kitchen PARoxetine (PAXIL) 30 MG tablet Take 1 tablet (30 mg total) by mouth every morning. 90 tablet 0   No current facility-administered medications on file prior to visit.     BP 126/74 (BP Location: Left Arm, Patient Position: Sitting, Cuff Size: Large)   Pulse 100   Temp 98.1 F (36.7 C) (Oral)   Resp 16   Ht 5\' 4"  (1.626 m)   Wt 274 lb (124.3 kg)   LMP 09/11/2013   SpO2 100%   BMI 47.03 kg/m       Objective:   Physical Exam Constitutional:      Appearance: She is well-developed.  Neck:     Musculoskeletal: Neck supple.     Thyroid: No thyromegaly.  Cardiovascular:     Rate and Rhythm: Normal rate and regular rhythm.     Heart sounds: Normal heart sounds. No murmur.  Pulmonary:     Effort: Pulmonary effort is normal. No respiratory distress.     Breath sounds: Normal breath sounds. No wheezing.   Skin:    General: Skin is warm and dry.  Neurological:     Mental Status: She is alert and oriented to person, place, and time.  Psychiatric:        Behavior: Behavior normal.        Thought Content: Thought content normal.        Judgment: Judgment normal.           Assessment & Plan:  Depression-this is uncontrolled.  She is followed by psychiatry.  She denies current suicide ideation.  She is maintained on Wellbutrin and Paxil.  Alcohol abuse-we discussed importance of cessation.  Loss of consciousness- could have been vasovagal event in the setting of vomiting.  However seizure is a possibility.  I suggested that we refer her to neurology for further evaluation.  Her alcohol use as well as Wellbutrin increases her risk for seizure.  She is agreeable to this referral.  I will also check a TSH complete metabolic panel and CBC.  I recommended an EKG for further cardiac evaluation and she declines as it was not covered by insurance last visit.  Hypertension-this is stable on current medication.  Continue same.  Asthma-recent increase in symptoms.  We discussed possibility of a maintenance medication such as Advair.  She declines at this time.  We will continue albuterol on a as needed basis.

## 2018-11-25 NOTE — Patient Instructions (Signed)
Please complete lab work prior to leaving. You should be contacted about scheduling your appointment with neurology.

## 2018-11-25 NOTE — Patient Instructions (Signed)
Please schedule your next medicare wellness visit with me in 1 yr.  Continue to eat heart healthy diet (full of fruits, vegetables, whole grains, lean protein, water--limit salt, fat, and sugar intake) and increase physical activity as tolerated.  Continue doing brain stimulating activities (puzzles, reading, adult coloring books, staying active) to keep memory sharp.    Julie Powell , Thank you for taking time to come for your Medicare Wellness Visit. I appreciate your ongoing commitment to your health goals. Please review the following plan we discussed and let me know if I can assist you in the future.   These are the goals we discussed: Goals    . Increase physical activity    . Increase water intake    . Reduce alcohol intake    . Stop drinking so much (pt-stated)       This is a list of the screening recommended for you and due dates:  Health Maintenance  Topic Date Due  . DEXA scan (bone density measurement)  04/04/2019  . Pap Smear  04/27/2019  . Mammogram  05/14/2020  . Colon Cancer Screening  11/08/2023  . Tetanus Vaccine  11/13/2024  . Flu Shot  Completed  .  Hepatitis C: One time screening is recommended by Center for Disease Control  (CDC) for  adults born from [birth year] through 1963.   Completed  . HIV Screening  Completed     Health Maintenance After Age 50 After age 92, you are at a higher risk for certain long-term diseases and infections as well as injuries from falls. Falls are a major cause of broken bones and head injuries in people who are older than age 56. Getting regular preventive care can help to keep you healthy and well. Preventive care includes getting regular testing and making lifestyle changes as recommended by your health care provider. Talk with your health care provider about:  Which screenings and tests you should have. A screening is a test that checks for a disease when you have no symptoms.  A diet and exercise plan that is right for you. What  should I know about screenings and tests to prevent falls? Screening and testing are the best ways to find a health problem early. Early diagnosis and treatment give you the best chance of managing medical conditions that are common after age 56. Certain conditions and lifestyle choices may make you more likely to have a fall. Your health care provider may recommend:  Regular vision checks. Poor vision and conditions such as cataracts can make you more likely to have a fall. If you wear glasses, make sure to get your prescription updated if your vision changes.  Medicine review. Work with your health care provider to regularly review all of the medicines you are taking, including over-the-counter medicines. Ask your health care provider about any side effects that may make you more likely to have a fall. Tell your health care provider if any medicines that you take make you feel dizzy or sleepy.  Osteoporosis screening. Osteoporosis is a condition that causes the bones to get weaker. This can make the bones weak and cause them to break more easily.  Blood pressure screening. Blood pressure changes and medicines to control blood pressure can make you feel dizzy.  Strength and balance checks. Your health care provider may recommend certain tests to check your strength and balance while standing, walking, or changing positions.  Foot health exam. Foot pain and numbness, as well as not wearing proper  footwear, can make you more likely to have a fall.  Depression screening. You may be more likely to have a fall if you have a fear of falling, feel emotionally low, or feel unable to do activities that you used to do.  Alcohol use screening. Using too much alcohol can affect your balance and may make you more likely to have a fall. What actions can I take to lower my risk of falls? General instructions  Talk with your health care provider about your risks for falling. Tell your health care provider  if: ? You fall. Be sure to tell your health care provider about all falls, even ones that seem minor. ? You feel dizzy, sleepy, or off-balance.  Take over-the-counter and prescription medicines only as told by your health care provider. These include any supplements.  Eat a healthy diet and maintain a healthy weight. A healthy diet includes low-fat dairy products, low-fat (lean) meats, and fiber from whole grains, beans, and lots of fruits and vegetables. Home safety  Remove any tripping hazards, such as rugs, cords, and clutter.  Install safety equipment such as grab bars in bathrooms and safety rails on stairs.  Keep rooms and walkways well-lit. Activity   Follow a regular exercise program to stay fit. This will help you maintain your balance. Ask your health care provider what types of exercise are appropriate for you.  If you need a cane or walker, use it as recommended by your health care provider.  Wear supportive shoes that have nonskid soles. Lifestyle  Do not drink alcohol if your health care provider tells you not to drink.  If you drink alcohol, limit how much you have: ? 0-1 drink a day for women. ? 0-2 drinks a day for men.  Be aware of how much alcohol is in your drink. In the [place] one drink equals one typical bottle of beer (12 oz), one-half glass of wine (5 oz), or one shot of hard liquor (1 oz).  Do not use any products that contain nicotine or tobacco, such as cigarettes and e-cigarettes. If you need help quitting, ask your health care provider. Summary  Having a healthy lifestyle and getting preventive care can help to protect your health and wellness after age 56.  Screening and testing are the best way to find a health problem early and help you avoid having a fall. Early diagnosis and treatment give you the best chance for managing medical conditions that are more common for people who are older than age 56.  Falls are a major cause of broken bones and  head injuries in people who are older than age 56. Take precautions to prevent a fall at home.  Work with your health care provider to learn what changes you can make to improve your health and wellness and to prevent falls. This information is not intended to replace advice given to you by your health care provider. Make sure you discuss any questions you have with your health care provider. Document Released: 09/05/2017 Document Revised: 09/05/2017 Document Reviewed: 09/05/2017 Elsevier Interactive Patient Education  2019 [employer].

## 2018-11-25 NOTE — Progress Notes (Signed)
RN note reviewed and agree.  Sulema Braid S O'Sullivan NP 

## 2018-11-26 ENCOUNTER — Encounter: Payer: Self-pay | Admitting: Neurology

## 2018-12-03 ENCOUNTER — Other Ambulatory Visit: Payer: Self-pay | Admitting: Family

## 2018-12-03 DIAGNOSIS — I10 Essential (primary) hypertension: Secondary | ICD-10-CM

## 2019-02-04 ENCOUNTER — Ambulatory Visit: Payer: Self-pay | Admitting: Neurology

## 2019-02-11 ENCOUNTER — Ambulatory Visit (HOSPITAL_COMMUNITY): Payer: Medicare Other | Admitting: Psychiatry

## 2019-02-25 ENCOUNTER — Ambulatory Visit: Payer: Self-pay | Admitting: Family

## 2019-03-10 ENCOUNTER — Other Ambulatory Visit (HOSPITAL_COMMUNITY): Payer: Self-pay

## 2019-03-10 DIAGNOSIS — F332 Major depressive disorder, recurrent severe without psychotic features: Secondary | ICD-10-CM

## 2019-03-10 MED ORDER — PAROXETINE HCL 30 MG PO TABS: 30.0000 mg | ORAL_TABLET | Freq: Every morning | ORAL | 0 refills | Status: DC

## 2019-03-10 MED ORDER — BUPROPION HCL 100 MG PO TABS: 200.0000 mg | ORAL_TABLET | Freq: Two times a day (BID) | ORAL | 0 refills | Status: DC

## 2019-03-10 MED ORDER — LITHIUM CARBONATE ER 300 MG PO TBCR: 300.0000 mg | EXTENDED_RELEASE_TABLET | Freq: Two times a day (BID) | ORAL | 0 refills | Status: DC

## 2019-03-13 ENCOUNTER — Ambulatory Visit (INDEPENDENT_AMBULATORY_CARE_PROVIDER_SITE_OTHER): Payer: Medicare Other | Admitting: Psychiatry

## 2019-03-13 ENCOUNTER — Encounter (HOSPITAL_COMMUNITY): Payer: Self-pay | Admitting: Psychiatry

## 2019-03-13 DIAGNOSIS — F3130 Bipolar disorder, current episode depressed, mild or moderate severity, unspecified: Secondary | ICD-10-CM | POA: Diagnosis not present

## 2019-03-13 DIAGNOSIS — F332 Major depressive disorder, recurrent severe without psychotic features: Secondary | ICD-10-CM

## 2019-03-13 DIAGNOSIS — F1020 Alcohol dependence, uncomplicated: Secondary | ICD-10-CM

## 2019-03-13 DIAGNOSIS — F4010 Social phobia, unspecified: Secondary | ICD-10-CM

## 2019-03-13 NOTE — Progress Notes (Signed)
Patient ID: Julie Powell, female   DOB: Aug 13, 1961, 58 y.o.   MRN: [id number] [name] Follow-up Outpatient Visit  Julie Powell 1961/01/14  I connected with Julie Powell on 03/13/19 at 11:00 AM EDT by telephone and verified that I am speaking with the correct person using two identifiers.   I discussed the limitations, risks, security and privacy concerns of performing an evaluation and management service by telephone and the availability of in person appointments. I also discussed with the patient that there may be a patient responsible charge related to this service. The patient expressed understanding and agreed to proceed.  Date:03/13/2019 No chief complaint on file. depression follow up  HPI Comments: Julie Powell is a 58  Y/O woman with a past psychiatric history Major Depressive Disorder, severe, recurrent. Mood disorder due to [facility], Alcohol use disorder and Social Phobia.    She stopped, then restarted alcohol and now again off since last 2 weeks Staying at home due to pandemic, says she is used to staying home so it doesn't bother Planning to join audio AA if possibile  Depression is  fair  Lithium level 0.6 last one   . Duration: since young age . Timing: more so in evening  . Context: alcohol use history\ . Modifying factors: meds, son . Associated signs and symptoms: Denies psychosis   Traumatic Brain Injury: No none   Review of Systems  Constitutional: Negative for fever.  Cardiovascular: Negative for chest pain.  Gastrointestinal: Negative for nausea.  Skin: Negative for itching.  Neurological: Negative for tremors and headaches.  Psychiatric/Behavioral: Negative for substance abuse and suicidal ideas.   There were no vitals filed for this visit. Physical Exam  Constitutional: She appears well-developed and well-nourished. No distress.  Skin: She is not diaphoretic.  No tremors of hands noted while patient was interviewed.     Past Medical  History: Reviewed  Past Medical History:  Diagnosis Date  . Anxiety   . Asthma   . B12 deficiency   . Bipolar 1 disorder ([place]) 2010   ect  . Fibroids   . Hypertension   . Plantar fasciitis 2013  . Sciatica    RT hip  . Vitamin D deficiency     Allergies: No Known Allergies   Current Medications: Reviewed   Current Outpatient Medications on File Prior to Visit  Medication Sig Dispense Refill  . albuterol (VENTOLIN HFA) 108 (90 Base) MCG/ACT inhaler Inhale 2 puffs into the lungs every 6 (six) hours as needed for wheezing or shortness of breath. 3 Inhaler 1  . buPROPion (WELLBUTRIN) 100 MG tablet Take 2 tablets (200 mg total) by mouth 2 (two) times daily. 360 tablet 0  . cholecalciferol (VITAMIN D) 1000 UNITS tablet Take 5,000 Units by mouth 3 (three) times a week.     . Cyanocobalamin 2500 MCG SUBL Place 1 tablet (2,500 mcg total) under the tongue 3 (three) times a week.  0  . Flaxseed, Linseed, (FLAX SEED OIL PO) Take by mouth 3 (three) times a week.     . hyoscyamine (LEVBID) 0.375 MG 12 hr tablet Take 0.375 mg by mouth daily.  0  . lisinopril (PRINIVIL,ZESTRIL) 10 MG tablet TAKE 1 TABLET(10 MG) BY MOUTH DAILY 90 tablet 1  . lithium carbonate (LITHOBID) 300 MG CR tablet Take 1 tablet (300 mg total) by mouth 2 (two) times daily. 180 tablet 0  . Multiple Vitamin (MULTIVITAMIN) tablet Take 1 tablet by mouth 3 (three) times a week.     Marland Kitchen  PARoxetine (PAXIL) 30 MG tablet Take 1 tablet (30 mg total) by mouth every morning. 90 tablet 0   No current facility-administered medications on file prior to visit.       Family History: Reviewed  Family History   Problem  Relation  Age of Onset   .  Adopted: Yes   .  Aneurysm  Father     Psychiatric Specialty Exam:  Objective: Appearance:   Eye Contact::   Speech: Clear and Coherent and Normal Rate   Volume: Normal   Mood  fair  Affect: congruent  Thought Process: Goal Directed, Logical and Loose   Orientation: Full   Thought  Content: WDL   Suicidal Thoughts: Patient denies   Homicidal Thoughts: Patient denies   Judgement: Fair   Insight: Fair   Psychomotor Activity: Normal   Akathisia: No   Handed: Right   Memory: Immediate 3/3, Recent: 1/3   Julie Powell of knowledge-average to above average.  AIMS (if indicated): Not indicated at this time.   Assets: Communication Skills  Desire for Improvement    Assessment:  AXIS I: Major Depressive Disorder, ; GAD: insomnia: Alcohol use disorder Treatment Plan/Recommendations:   Plan of Care:  Provided supportive therapy. Reviewed side effects . Sent prescriptions.       .   Medications:  Continue medications with no change::   Bipolar depression: doing fair, continue lithium, no side effects  avoid alcohol  Anxiety: manageable. Continue paxil  Depression: fair. Continue welllbutrin.   Work on Energy manager Attend [place] or other suppot groups  Discussed to remain abstinence from alcohol  Fu 38m. .     I discussed the assessment and treatment plan with the patient. The patient was provided an opportunity to ask questions and all were answered. The patient agreed with the plan and demonstrated an understanding of the instructions.   The patient was advised to call back or seek an in-person evaluation if the symptoms worsen or if the condition fails to improve as anticipated.  I provided 15 minutes of non-face-to-face time during this encounter.       Merian Capron, M.D.  03/13/2019 11:09 AM

## 2019-04-02 ENCOUNTER — Ambulatory Visit (INDEPENDENT_AMBULATORY_CARE_PROVIDER_SITE_OTHER): Payer: Medicare Other | Admitting: Family

## 2019-04-02 ENCOUNTER — Encounter: Payer: Self-pay | Admitting: Family

## 2019-04-02 ENCOUNTER — Other Ambulatory Visit: Payer: Self-pay

## 2019-04-02 VITALS — BP 123/82

## 2019-04-02 DIAGNOSIS — M546 Pain in thoracic spine: Secondary | ICD-10-CM

## 2019-04-02 DIAGNOSIS — I10 Essential (primary) hypertension: Secondary | ICD-10-CM

## 2019-04-02 DIAGNOSIS — F332 Major depressive disorder, recurrent severe without psychotic features: Secondary | ICD-10-CM

## 2019-04-02 DIAGNOSIS — E559 Vitamin D deficiency, unspecified: Secondary | ICD-10-CM | POA: Diagnosis not present

## 2019-04-02 DIAGNOSIS — E538 Deficiency of other specified B group vitamins: Secondary | ICD-10-CM | POA: Diagnosis not present

## 2019-04-02 DIAGNOSIS — F1010 Alcohol abuse, uncomplicated: Secondary | ICD-10-CM

## 2019-04-02 MED ORDER — METHOCARBAMOL 500 MG PO TABS: 500.0000 mg | ORAL_TABLET | Freq: Three times a day (TID) | ORAL | 0 refills | Status: DC | PRN

## 2019-04-02 NOTE — Progress Notes (Signed)
Virtual Visit via Video Note  I connected with [name] on 04/02/19 at 11:00 AM EDT by a video enabled telemedicine application and verified that I am speaking with the correct person using two identifiers. This visit type was conducted due to national recommendations for restrictions regarding the COVID-19 Pandemic (e.g. social distancing).  This format is felt to be most appropriate for this patient at this time.   I discussed the limitations of evaluation and management by telemedicine and the availability of in person appointments. The patient expressed understanding and agreed to proceed.  Only the patient and myself were on today's video visit. The patient was at home and I was at home at the time of today's visit.   History of Present Illness:  HTN- bp medication includes lisinopril 10mg ,  BP Readings from Last 3 Encounters:  11/25/18 126/74  11/25/18 126/74  05/07/18 132/65   Reports that her home bp today was 123/82  Depression-reports that her depression symptoms are about the same.  States that she usually stays in the house so the shelter at home has not been a lifestyle change for her.   B12 deficiency- maintained on SL b12.   Vit d deficiency-maintained on OTC vit D  Alcohol abuse- reports that she had stopped drinking for some time but has now picked it back up. She is now drinking 12 drinks/day (beer only).   Reports pain in her upper back. Right between the shoulder blades. Describes it as a deep soreness. Pain is worse with flexing her back or moving in certain directions. Has been using ibuprofen without significant improvement.    Observations/Objective:   Gen: Awake, alert, no acute distress Resp: Breathing is even and non-labored Psych: flat affect, calm/pleasant Neuro: Alert and Oriented x 3, + facial symmetry, speech is clear.   Assessment and Plan:  HTN- bp stable. Continue lisinopril.  Depression- fair control. This is being managed by  psychiatry.  b12 deficiency- continue SL supplement.  Vit D deficiency- continue OTC vitamin D.  Thoracic back pain- given bariatric status I have recommended that she discontinue use of NSAIDS.  Will give trial of prn robaxin. I did advise her that she cannot mix robaxin with alcohol and she verbalizes understanding. Also recommended tylenol prn and that she avoid taking >3000mg /day.  ETOH abuse- pt encouraged to taper down/off of alcohol.  She will consider.   Follow Up Instructions:    I discussed the assessment and treatment plan with the patient. The patient was provided an opportunity to ask questions and all were answered. The patient agreed with the plan and demonstrated an understanding of the instructions.   The patient was advised to call back or seek an in-person evaluation if the symptoms worsen or if the condition fails to improve as anticipated.    Nance Pear, NP

## 2019-04-07 DIAGNOSIS — F32A Depression, unspecified: Secondary | ICD-10-CM | POA: Insufficient documentation

## 2019-04-07 DIAGNOSIS — F329 Major depressive disorder, single episode, unspecified: Secondary | ICD-10-CM | POA: Insufficient documentation

## 2019-04-07 DIAGNOSIS — M722 Plantar fascial fibromatosis: Secondary | ICD-10-CM | POA: Insufficient documentation

## 2019-04-08 ENCOUNTER — Other Ambulatory Visit: Payer: Self-pay

## 2019-04-08 ENCOUNTER — Telehealth (INDEPENDENT_AMBULATORY_CARE_PROVIDER_SITE_OTHER): Payer: Medicare Other | Admitting: Neurology

## 2019-04-08 DIAGNOSIS — R55 Syncope and collapse: Secondary | ICD-10-CM

## 2019-04-08 DIAGNOSIS — F1010 Alcohol abuse, uncomplicated: Secondary | ICD-10-CM

## 2019-04-08 NOTE — Progress Notes (Signed)
Virtual Visit via Video Note The purpose of this virtual visit is to provide medical care while limiting exposure to the novel coronavirus.    Consent was obtained for video visit:  Yes.   Answered questions that patient had about telehealth interaction:  Yes.   I discussed the limitations, risks, security and privacy concerns of performing an evaluation and management service by telemedicine. I also discussed with the patient that there may be a patient responsible charge related to this service. The patient expressed understanding and agreed to proceed.  Pt location: Home Physician Location: office Name of referring provider:  [name], NP I connected with [name] at patients initiation/request on 04/08/2019 at 10:30 AM EDT by video enabled telemedicine application and verified that I am speaking with the correct person using two identifiers. Pt MRN:  [id number] Pt DOB:  09/30/1961 Video Participants:  [name]   History of Present Illness:  This is a pleasant 58 year old right-handed woman with a history of hypertension, depression, alcohol abuse, presenting for evaluation of recurrent syncope. The first episode occurred in 2015, she was in the hospital for abdominal pain and nausea and reports that while lying on the stretcher, her son told her she went unconscious with some trembling of her foot. She does not remember this. There is an ER visit in 02/2014 for abdominal pain, no mention of syncope. The second episode occurred at least a year ago, she was in the bathroom and felt like she was seeing stars. She was nauseated with dry heaving then woke up on the floor, no tongue bite or incontinence. The most recent episode occurred January 2020, she woke up and felt sick, which she assumed was due to alcohol intake. She had been standing at the sink washing her face then again had dry heaves and saw stars. She woke up on the floor with a pool of blood around her foot, she  had split the back of her foot like she had pressed it up against something. She saw that she had flung her wash cloth against the wall but did not remember doing this. No tongue bite or incontinence. She has a history of alcohol abuse, drinking 8-10 beers a day, and has woken up several times feeling the same nauseated feeling without passing out. She has gotten up the next day sometimes thinking she was going to clean the kitchen, not realizing or remembering that she had already done it. She lives alone. She denies any staring/unresponsive episodes, olfactory/gustatory hallucinations, focal numbness/tingling/weakness, myoclonic jerks. She denies any headaches, dizziness, diplopia, dysarthria/dysphagia, neck pain, bowel/bladder dysfunction. She has had some low back deep aching recently. She has noticed an aversion to strong scents, it feels like she is eating/swallowing the scent. Memory has never been good but she compensates well. She denies any sudden changes in alcohol intake prior to the episodes. She has been on the same dose of Wellbutrin for many years. She was adopted, as far as she knows, she had a normal birth and early development.  There is no history of febrile convulsions, CNS infections such as meningitis/encephalitis, significant traumatic brain injury, neurosurgical procedures, or family history of seizures.   PAST MEDICAL HISTORY: Past Medical History:  Diagnosis Date   Anxiety    Asthma    B12 deficiency    Bipolar 1 disorder ([place]) 2010   ect   Fibroids    Hypertension    Plantar fasciitis 2013   Sciatica    RT hip  Vitamin D deficiency     PAST SURGICAL HISTORY: Past Surgical History:  Procedure Laterality Date   [place]   "thyroglycol duct cyst in neck" had dysphagia, was told benign   GASTRIC BYPASS  2003   MICRODISCECTOMY LUMBAR  09/11/14   L3-L4  Dr Saintclair Halsted   PILONIDAL CYST DRAINAGE  1982   REDUCTION MAMMAPLASTY       SP CHOLECYSTOMY  1986    MEDICATIONS: Current Outpatient Medications on File Prior to Visit  Medication Sig Dispense Refill   albuterol (VENTOLIN HFA) 108 (90 Base) MCG/ACT inhaler Inhale 2 puffs into the lungs every 6 (six) hours as needed for wheezing or shortness of breath. 3 Inhaler 1   buPROPion (WELLBUTRIN) 100 MG tablet Take 2 tablets (200 mg total) by mouth 2 (two) times daily. 360 tablet 0   cholecalciferol (VITAMIN D) 1000 UNITS tablet Take 5,000 Units by mouth 3 (three) times a week.      Flaxseed, Linseed, (FLAX SEED OIL PO) Take by mouth 3 (three) times a week.      hyoscyamine (LEVBID) 0.375 MG 12 hr tablet Take 0.375 mg by mouth daily.  0   lisinopril (PRINIVIL,ZESTRIL) 10 MG tablet TAKE 1 TABLET(10 MG) BY MOUTH DAILY 90 tablet 1   lithium carbonate (LITHOBID) 300 MG CR tablet Take 1 tablet (300 mg total) by mouth 2 (two) times daily. 180 tablet 0   methocarbamol (ROBAXIN) 500 MG tablet Take 1 tablet (500 mg total) by mouth every 8 (eight) hours as needed for muscle spasms. (Patient not taking: Reported on 04/07/2019) 20 tablet 0   Multiple Vitamin (MULTIVITAMIN) tablet Take 1 tablet by mouth 3 (three) times a week.      PARoxetine (PAXIL) 30 MG tablet Take 1 tablet (30 mg total) by mouth every morning. 90 tablet 0   No current facility-administered medications on file prior to visit.     ALLERGIES: No Known Allergies  FAMILY HISTORY: Family History  Adopted: Yes  Problem Relation Age of Onset   Aneurysm Father    Alcohol abuse Father    Alcohol abuse Brother    Alcohol abuse Brother    Cancer Brother        prostate   Alcohol abuse Brother    Lung cancer Sister    Cancer Sister        lung   Thyroid disease Neg Hx    Diabetes Neg Hx    Hypertension Neg Hx     Observations/Objective:   GEN:  The patient appears stated age and is in NAD.  Neurological examination: Patient is awake, alert, oriented x 3. No aphasia or dysarthria. Intact  fluency and comprehension. Remote and recent memory intact. Able to name and repeat. Cranial nerves: Extraocular movements intact with no nystagmus. No facial asymmetry. Motor: moves all extremities symmetrically, at least anti-gravity x 4. No incoordination on finger to nose testing. Gait: narrow-based and steady, able to tandem walk adequately. Negative Romberg test.  Assessment and Plan:   This is a pleasant 58 year old right-handed woman with a history of hypertension, depression, alcohol abuse, presenting for evaluation of recurrent syncope. They have all been preceded by dry heaves/nausea/seeing stars. Symptoms suggestive of vasovagal syncope, less likely seizure. We discussed doing a 1-hour EEG for completion. We discussed how alcohol abuse can cause hypotension leading to syncope, as well as dry heaving causing vasovagal syncope. She was encouraged to get help for alcohol abuse and wean  down alcohol intake. We discussed [place] driving laws, no driving after an episode of loss of consciousness until 6 months event-free. If EEG is normal, she will follow-up on a prn basis and knows to call for any changes.    Follow Up Instructions:   -I discussed the assessment and treatment plan with the patient. The patient was provided an opportunity to ask questions and all were answered. The patient agreed with the plan and demonstrated an understanding of the instructions.   The patient was advised to call back or seek an in-person evaluation if the symptoms worsen or if the condition fails to improve as anticipated.    Cameron Sprang, MD

## 2019-04-11 ENCOUNTER — Ambulatory Visit: Payer: Self-pay | Admitting: Neurology

## 2019-04-23 ENCOUNTER — Other Ambulatory Visit: Payer: Self-pay

## 2019-04-23 ENCOUNTER — Other Ambulatory Visit: Payer: Medicare Other

## 2019-04-23 ENCOUNTER — Ambulatory Visit (INDEPENDENT_AMBULATORY_CARE_PROVIDER_SITE_OTHER): Payer: Medicare Other | Admitting: Neurology

## 2019-04-23 DIAGNOSIS — R55 Syncope and collapse: Secondary | ICD-10-CM

## 2019-04-28 ENCOUNTER — Telehealth: Payer: Self-pay

## 2019-04-28 NOTE — Procedures (Addendum)
ELECTROENCEPHALOGRAM REPORT  Date of Study: 04/23/2019  Patient's Name: Julie Powell MRN: [id number] Date of Birth: 1961/02/09  Referring Provider: [name], NP  Clinical History: This is a 58 year old woman with recurrent syncope preceded by dry heaves/nausea  Medications: VENTOLIN HFA 108 (90 Base) MCG/ACT inhaler WELLBUTRIN 100 MG tablet VITAMIN D 1000 UNITS table FLAX SEED OIL PO LEVBID 0.375 MG 12 hr tablet PRINIVIL,ZESTRIL 10 MG tablet LITHOBID 300 MG CR tablet ROBAXIN 500 MG tablet MULTIVITAMIN tablet PAXIL 30 MG tablet  Technical Summary: A multichannel digital 1-hour EEG recording measured by the international 10-20 system with electrodes applied with paste and impedances below 5000 ohms performed in our laboratory with EKG monitoring in an awake and asleep patient.  Hyperventilation was not performed. Photic stimulation was performed.  The digital EEG was referentially recorded, reformatted, and digitally filtered in a variety of bipolar and referential montages for optimal display.    Description: The patient is awake and asleep during the recording.  During maximal wakefulness, there is a symmetric, medium voltage 9.5-10 Hz posterior dominant rhythm that attenuates with eye opening.  The record is symmetric.  During drowsiness and sleep, there is an increase in theta slowing of the background.  Vertex waves and symmetric sleep spindles were seen.  Photic stimulation did not elicit any abnormalities.  There were no epileptiform discharges or electrographic seizures seen.    EKG lead was unremarkable.  Impression: This 1-hour awake and asleep EEG is normal.    Clinical Correlation: A normal EEG does not exclude a clinical diagnosis of epilepsy.  If further clinical questions remain, prolonged EEG may be helpful.  Clinical correlation is advised.   Ellouise Newer, M.D.

## 2019-04-28 NOTE — Telephone Encounter (Signed)
Pt called given EEG results Pls let her know the brain wave test was normal, continue follow-up with PCP and make sure heart is fine as well

## 2019-06-02 ENCOUNTER — Other Ambulatory Visit (HOSPITAL_COMMUNITY): Payer: Self-pay

## 2019-06-02 DIAGNOSIS — F332 Major depressive disorder, recurrent severe without psychotic features: Secondary | ICD-10-CM

## 2019-06-02 MED ORDER — PAROXETINE HCL 30 MG PO TABS: 30.0000 mg | ORAL_TABLET | Freq: Every morning | ORAL | 0 refills | Status: DC

## 2019-06-02 MED ORDER — LITHIUM CARBONATE ER 300 MG PO TBCR: 300.0000 mg | EXTENDED_RELEASE_TABLET | Freq: Two times a day (BID) | ORAL | 0 refills | Status: DC

## 2019-06-02 MED ORDER — BUPROPION HCL 100 MG PO TABS: 200.0000 mg | ORAL_TABLET | Freq: Two times a day (BID) | ORAL | 0 refills | Status: DC

## 2019-06-03 ENCOUNTER — Other Ambulatory Visit: Payer: Self-pay | Admitting: *Deleted

## 2019-06-03 ENCOUNTER — Other Ambulatory Visit (HOSPITAL_BASED_OUTPATIENT_CLINIC_OR_DEPARTMENT_OTHER): Payer: Self-pay | Admitting: Family

## 2019-06-03 DIAGNOSIS — Z1231 Encounter for screening mammogram for malignant neoplasm of breast: Secondary | ICD-10-CM

## 2019-06-03 DIAGNOSIS — I10 Essential (primary) hypertension: Secondary | ICD-10-CM

## 2019-06-03 MED ORDER — LISINOPRIL 10 MG PO TABS: ORAL_TABLET | ORAL | 1 refills | Status: DC

## 2019-06-10 ENCOUNTER — Other Ambulatory Visit: Payer: Self-pay

## 2019-06-10 ENCOUNTER — Ambulatory Visit (HOSPITAL_BASED_OUTPATIENT_CLINIC_OR_DEPARTMENT_OTHER)
Admission: RE | Admit: 2019-06-10 | Discharge: 2019-06-10 | Disposition: A | Discharge status: Home / Self Care | Payer: Medicare Other | Source: Ambulatory Visit | Attending: Family | Admitting: Family

## 2019-06-10 ENCOUNTER — Encounter (HOSPITAL_BASED_OUTPATIENT_CLINIC_OR_DEPARTMENT_OTHER): Payer: Self-pay

## 2019-06-10 DIAGNOSIS — Z1231 Encounter for screening mammogram for malignant neoplasm of breast: Secondary | ICD-10-CM | POA: Diagnosis not present

## 2019-06-19 ENCOUNTER — Ambulatory Visit (HOSPITAL_COMMUNITY): Payer: Medicare Other | Admitting: Psychiatry

## 2019-07-02 ENCOUNTER — Encounter: Payer: Medicare Other | Admitting: Family

## 2019-07-07 ENCOUNTER — Ambulatory Visit: Payer: Medicare Other | Admitting: Obstetrics & Gynecology

## 2019-07-09 ENCOUNTER — Encounter: Payer: Self-pay | Admitting: Obstetrics & Gynecology

## 2019-07-09 ENCOUNTER — Other Ambulatory Visit: Payer: Self-pay

## 2019-07-09 ENCOUNTER — Ambulatory Visit (INDEPENDENT_AMBULATORY_CARE_PROVIDER_SITE_OTHER): Payer: Medicare Other | Admitting: Obstetrics & Gynecology

## 2019-07-09 ENCOUNTER — Other Ambulatory Visit (HOSPITAL_COMMUNITY)
Admission: RE | Admit: 2019-07-09 | Discharge: 2019-07-09 | Disposition: A | Discharge status: Home / Self Care | Payer: Medicare Other | Source: Ambulatory Visit | Attending: Obstetrics & Gynecology | Admitting: Obstetrics & Gynecology

## 2019-07-09 VITALS — BP 120/58 | HR 80 | Wt 272.2 lb

## 2019-07-09 DIAGNOSIS — Z23 Encounter for immunization: Secondary | ICD-10-CM

## 2019-07-09 DIAGNOSIS — Z01419 Encounter for gynecological examination (general) (routine) without abnormal findings: Secondary | ICD-10-CM | POA: Diagnosis not present

## 2019-07-09 DIAGNOSIS — Z124 Encounter for screening for malignant neoplasm of cervix: Secondary | ICD-10-CM | POA: Diagnosis not present

## 2019-07-09 NOTE — Patient Instructions (Signed)

## 2019-07-09 NOTE — Progress Notes (Signed)
Subjective:     Julie Powell is a 58 y.o. female here for a routine exam. G1P1 LMP 5 years prev. Current complaints: none. Pt is s/p Roux-en-y procedures but. Has gained a fair bit of the weight back. She reports minimal exercise but, has a neighbor who is interested in a walking partner.    Pt is adopted so does not know full FH but, does have a birth sister who died of ling ca at age 32 years.    Gynecologic History Patient's last menstrual period was 09/11/2013. Contraception: post menopausal status Last Pap: 04/26/2016 Results were: normal Last mammogram: 06/10/2019. Results were: normal  Obstetric History OB History  Gravida Para Term Preterm AB Living  1 1 1     1   SAB TAB Ectopic Multiple Live Births          1    # Outcome Date GA Lbr Len/2nd Weight Sex Delivery Anes PTL Lv  1 Term 1995 [redacted]w[redacted]d   M CS-Unspec Other N LIV    The following portions of the patient's history were reviewed and updated as appropriate: allergies, current medications, past family history, past medical history, past social history, past surgical history and problem list.  Review of Systems Pertinent items are noted in HPI.    Objective:  BP (!) 120/58   Pulse 80   Wt 272 lb 3.2 oz (123.5 kg)   LMP 09/11/2013   BMI 46.72 kg/m   General Appearance:    Alert, cooperative, no distress, appears stated age  Head:    Normocephalic, without obvious abnormality, atraumatic  Eyes:    conjunctiva/corneas clear, EOM's intact, both eyes  Ears:    Normal external ear canals, both ears  Nose:   Nares normal, septum midline, mucosa normal, no drainage    or sinus tenderness  Throat:   Lips, mucosa, and tongue normal; teeth and gums normal  Neck:   Supple, symmetrical, trachea midline, no adenopathy;    thyroid:  no enlargement/tenderness/nodules  Back:     Symmetric, no curvature, ROM normal, no CVA tenderness  Lungs:     Clear to auscultation bilaterally, respirations unlabored  Chest Wall:    No tenderness  or deformity   Heart:    Regular rate and rhythm, S1 and S2 normal, no murmur, rub   or gallop  Breast Exam:    No tenderness, masses, or nipple abnormality  Abdomen:     Soft, non-tender, bowel sounds active all four quadrants,    no masses, no organomegaly; well healed midline and RUQ incision   Genitalia:    Normal female without lesion, discharge or tenderness   Cervix is VERY anterior  Extremities:   Extremities normal, atraumatic, no cyanosis or edema  Pulses:   2+ and symmetric all extremities  Skin:   Skin color, texture, turgor normal, no rashes or lesions     Assessment:    Healthy female exam.   No GYN issues    Plan:    Follow up in: 1 year.   f/u sooner prn F/u PAP and hrHPV  Julie Powell L. Harraway-Smith, M.D., Cherlynn June

## 2019-07-10 LAB — CYTOLOGY - PAP
Adequacy: ABSENT
Diagnosis: NEGATIVE
HPV: NOT DETECTED

## 2019-07-17 ENCOUNTER — Other Ambulatory Visit: Payer: Self-pay

## 2019-07-18 ENCOUNTER — Encounter: Payer: Medicare Other | Admitting: Family

## 2019-07-21 ENCOUNTER — Other Ambulatory Visit: Payer: Self-pay

## 2019-07-22 ENCOUNTER — Ambulatory Visit (INDEPENDENT_AMBULATORY_CARE_PROVIDER_SITE_OTHER): Payer: Medicare Other | Admitting: Family

## 2019-07-22 ENCOUNTER — Encounter: Payer: Self-pay | Admitting: Family

## 2019-07-22 VITALS — BP 138/73 | HR 78 | Temp 96.4°F | Resp 16 | Ht 64.0 in | Wt 275.0 lb

## 2019-07-22 DIAGNOSIS — F332 Major depressive disorder, recurrent severe without psychotic features: Secondary | ICD-10-CM | POA: Diagnosis not present

## 2019-07-22 DIAGNOSIS — Z0000 Encounter for general adult medical examination without abnormal findings: Secondary | ICD-10-CM

## 2019-07-22 DIAGNOSIS — E785 Hyperlipidemia, unspecified: Secondary | ICD-10-CM

## 2019-07-22 DIAGNOSIS — F1010 Alcohol abuse, uncomplicated: Secondary | ICD-10-CM

## 2019-07-22 DIAGNOSIS — I10 Essential (primary) hypertension: Secondary | ICD-10-CM

## 2019-07-22 LAB — HEPATIC FUNCTION PANEL
ALT: 17 U/L (ref 0–35)
AST: 17 U/L (ref 0–37)
Albumin: 3.9 g/dL (ref 3.5–5.2)
Alkaline Phosphatase: 116 U/L (ref 39–117)
Bilirubin, Direct: 0.1 mg/dL (ref 0.0–0.3)
Total Bilirubin: 0.5 mg/dL (ref 0.2–1.2)
Total Protein: 6.6 g/dL (ref 6.0–8.3)

## 2019-07-22 LAB — BASIC METABOLIC PANEL
BUN: 10 mg/dL (ref 6–23)
CO2: 28 mEq/L (ref 19–32)
Calcium: 9.8 mg/dL (ref 8.4–10.5)
Chloride: 104 mEq/L (ref 96–112)
Creatinine, Ser: 0.75 mg/dL (ref 0.40–1.20)
GFR: 96.75 mL/min (ref >60.00)
Glucose, Bld: 79 mg/dL (ref 70–99)
Potassium: 4.3 mEq/L (ref 3.5–5.1)
Sodium: 141 mEq/L (ref 135–145)

## 2019-07-22 LAB — LIPID PANEL
Cholesterol: 248 mg/dL — ABNORMAL HIGH (ref 0–200)
HDL: 160.9 mg/dL (ref >39.00)
LDL Cholesterol: 73 mg/dL (ref 0–99)
NonHDL: 87.29
Total CHOL/HDL Ratio: 2
Triglycerides: 70 mg/dL (ref 0.0–149.0)
VLDL: 14 mg/dL (ref 0.0–40.0)

## 2019-07-22 LAB — TSH: TSH: 2.04 u[IU]/mL (ref 0.35–4.50)

## 2019-07-22 MED ORDER — SHINGRIX 50 MCG/0.5ML IM SUSR: INTRAMUSCULAR | 1 refills | Status: DC

## 2019-07-22 NOTE — Patient Instructions (Addendum)
Try calling [facility] to inquire about their outpatient alcohol detox program. [phone].

## 2019-07-22 NOTE — Progress Notes (Addendum)
Subjective:    Patient ID: Julie Powell, female    DOB: [birth year], 58 y.o.   MRN: [id number]  HPI  Patient presents today for complete physical.  Alcohol Abuse- went to [facility]. States they wouldn't admit her for alcohol rehab. She really wants help and to quit drinking.   Drinks 12 pack of beer a day.   Depression- reports fair control of her depression.    HTN- Continues lisinopril once daily.   BP Readings from Last 3 Encounters:  07/22/19 138/73  07/09/19 (!) 120/58  04/07/19 123/82   Left arm pain-  Reports + pain in the left lateral epicondyle.  She is right handed.  Declines further work up.     Review of Systems    see HPI  Past Medical History:  Diagnosis Date  . Anxiety   . Asthma   . B12 deficiency   . Bipolar 1 disorder ([place]) 2010   ect  . Fibroids   . Hypertension   . Plantar fasciitis 2013  . Sciatica    RT hip  . Vitamin D deficiency      Social History   Socioeconomic History  . Marital status: Divorced    Spouse name: Not on file  . Number of children: 1  . Years of education: Not on file  . Highest education level: Bachelor's degree (e.g., BA, AB, BS)  Occupational History  . Not on file  Social Needs  . Financial resource strain: Not on file  . Food insecurity    Worry: Not on file    Inability: Not on file  . Transportation needs    Medical: Not on file    Non-medical: Not on file  Tobacco Use  . Smoking status: Former Smoker    Packs/day: 0.50    Years: 10.00    Pack years: 5.00    Types: Cigarettes    Quit date: 11/07/1991    Years since quitting: 27.7  . Smokeless tobacco: Never Used  Substance and Sexual Activity  . Alcohol use: Yes    Alcohol/week: 0.0 standard drinks    Comment: stopped wine. Now beer. 12 per day.   . Drug use: Not Currently    Types: "Crack" cocaine    Comment: Caffeine- Carbonated beverage 16 ounces.  . Sexual activity: Never    Birth control/protection: Post-menopausal  Lifestyle  .  Physical activity    Days per week: Not on file    Minutes per session: Not on file  . Stress: Not on file  Relationships  . Social Herbalist on phone: Not on file    Gets together: Not on file    Attends religious service: Not on file    Active member of club or organization: Not on file    Attends meetings of clubs or organizations: Not on file    Relationship status: Not on file  . Intimate partner violence    Fear of current or ex partner: Not on file    Emotionally abused: Not on file    Physically abused: Not on file    Forced sexual activity: Not on file  Other Topics Concern  . Not on file  Social History Narrative   Lives with 25 year old son   She is on disability due to depression   In the past she has worked as a Education officer, community for [employer] [place]   Former [employer]   Completed bachelor's degree  Divorced.   Enjoys television   Right handed    Apartment 1st floor     Past Surgical History:  Procedure Laterality Date  . CESAREAN SECTION  1995  . CYSTECTOMY  1998   "thyroglycol duct cyst in neck" had dysphagia, was told benign  . GASTRIC BYPASS  2003  . MICRODISCECTOMY LUMBAR  09/11/14   L3-L4  Dr Saintclair Halsted  . PILONIDAL CYST DRAINAGE  1982  . REDUCTION MAMMAPLASTY    . SP CHOLECYSTOMY  1986    Family History  Adopted: Yes  Problem Relation Age of Onset  . Aneurysm Father   . Alcohol abuse Father   . Alcohol abuse Brother   . Alcohol abuse Brother   . Cancer Brother        prostate  . Alcohol abuse Brother   . Lung cancer Sister   . Cancer Sister        lung  . Thyroid disease Neg Hx   . Diabetes Neg Hx   . Hypertension Neg Hx     No Known Allergies  Current Outpatient Medications on File Prior to Visit  Medication Sig Dispense Refill  . albuterol (VENTOLIN HFA) 108 (90 Base) MCG/ACT inhaler Inhale 2 puffs into the lungs every 6 (six) hours as needed for wheezing or shortness  of breath. 3 Inhaler 1  . buPROPion (WELLBUTRIN) 100 MG tablet Take 2 tablets (200 mg total) by mouth 2 (two) times daily. 360 tablet 0  . cholecalciferol (VITAMIN D) 1000 UNITS tablet Take 5,000 Units by mouth 3 (three) times a week.     . Flaxseed, Linseed, (FLAX SEED OIL PO) Take by mouth 3 (three) times a week.     . hyoscyamine (LEVBID) 0.375 MG 12 hr tablet Take 0.375 mg by mouth daily.  0  . lisinopril (ZESTRIL) 10 MG tablet TAKE 1 TABLET(10 MG) BY MOUTH DAILY 90 tablet 1  . lithium carbonate (LITHOBID) 300 MG CR tablet Take 1 tablet (300 mg total) by mouth 2 (two) times daily. 180 tablet 0  . Multiple Vitamin (MULTIVITAMIN) tablet Take 1 tablet by mouth 3 (three) times a week.     Marland Kitchen PARoxetine (PAXIL) 30 MG tablet Take 1 tablet (30 mg total) by mouth every morning. 90 tablet 0   No current facility-administered medications on file prior to visit.     BP 138/73 (BP Location: Right Arm, Patient Position: Sitting, Cuff Size: Large)   Pulse 78   Temp (!) 96.4 F (35.8 C) (Temporal)   Resp 16   Ht 5\' 4"  (1.626 m)   Wt 275 lb (124.7 kg)   LMP 09/11/2013   SpO2 100%   BMI 47.20 kg/m    Objective:   Physical Exam Constitutional:      Appearance: She is well-developed.  Neck:     Musculoskeletal: Neck supple.     Thyroid: No thyromegaly.  Cardiovascular:     Rate and Rhythm: Normal rate and regular rhythm.     Heart sounds: Normal heart sounds. No murmur.  Pulmonary:     Effort: Pulmonary effort is normal. No respiratory distress.     Breath sounds: Normal breath sounds. No wheezing.  Skin:    General: Skin is warm and dry.  Neurological:     Mental Status: She is alert and oriented to person, place, and time.  Psychiatric:        Behavior: Behavior normal.        Thought Content: Thought content normal.  Judgment: Judgment normal.           Assessment & Plan:  ETOH abuse- I gave her the contact information for [facility] inquire about ETOH rehab  program admission.  HTN- bp stable. Continue current medications. Obtain bmet.  Hyperlipidemia- obtain LFT, Lipid panel  Depression- report symptoms stable. Defer management to psychiatry.

## 2019-07-24 ENCOUNTER — Encounter (HOSPITAL_COMMUNITY): Payer: Self-pay | Admitting: Psychiatry

## 2019-07-24 ENCOUNTER — Ambulatory Visit (INDEPENDENT_AMBULATORY_CARE_PROVIDER_SITE_OTHER): Payer: Medicare Other | Admitting: Psychiatry

## 2019-07-24 ENCOUNTER — Other Ambulatory Visit: Payer: Self-pay

## 2019-07-24 DIAGNOSIS — F4010 Social phobia, unspecified: Secondary | ICD-10-CM

## 2019-07-24 DIAGNOSIS — F332 Major depressive disorder, recurrent severe without psychotic features: Secondary | ICD-10-CM

## 2019-07-24 DIAGNOSIS — F1020 Alcohol dependence, uncomplicated: Secondary | ICD-10-CM

## 2019-07-24 DIAGNOSIS — F3130 Bipolar disorder, current episode depressed, mild or moderate severity, unspecified: Secondary | ICD-10-CM | POA: Diagnosis not present

## 2019-07-24 DIAGNOSIS — F10288 Alcohol dependence with other alcohol-induced disorder: Secondary | ICD-10-CM

## 2019-07-24 NOTE — Progress Notes (Signed)
Patient ID: Julie Powell, female   DOB: [birth year], 58 y.o.   MRN: [id number] [facility] Follow-up Outpatient Visit  Kaleiyah Cauley January 06, 1961   I connected with [name] on 07/24/19 at  1:00 PM EDT by telephone and verified that I am speaking with the correct person using two identifiers.   I discussed the limitations, risks, security and privacy concerns of performing an evaluation and management service by telephone and the availability of in person appointments. I also discussed with the patient that there may be a patient responsible charge related to this service. The patient expressed understanding and agreed to proceed.  Date:07/24/2019 No chief complaint on file. depression follow up  HPI Comments: Ms. Marias is a 58  Y/O woman with a past psychiatric history Major Depressive Disorder, severe, recurrent. Mood disorder due to [facility], Alcohol use disorder and Social Phobia.    She continues to struggle with alcohol use, [facility] says there was no alcohol on blood and she was not withdrawing  Still drinks more then half pack a beer a day [facility] other programs Says meds help her depression campral didn't help or she didn't want to use for long Planning to join audio AA if possibile  Depression is  fair     . Duration: since young age . Timing: more so in evening  . Context: alcohol use history\ . Modifying factors: meds, son . Associated signs and symptoms: Denies psychosis   Traumatic Brain Injury: No none   Review of Systems  Cardiovascular: Negative for chest pain.  Skin: Negative for itching.  Neurological: Negative for headaches.  Psychiatric/Behavioral: Negative for substance abuse and suicidal ideas.   There were no vitals filed for this visit. Physical Exam  Constitutional: She appears well-developed and well-nourished. No distress.  Skin: She is not diaphoretic.  No tremors of hands noted  while patient was interviewed.     Past Medical History: Reviewed  Past Medical History:  Diagnosis Date  . Anxiety   . Asthma   . B12 deficiency   . Bipolar 1 disorder ([place]) 2010   ect  . Fibroids   . Hypertension   . Plantar fasciitis 2013  . Sciatica    RT hip  . Vitamin D deficiency     Allergies: No Known Allergies   Current Medications: Reviewed   Current Outpatient Medications on File Prior to Visit  Medication Sig Dispense Refill  . albuterol (VENTOLIN HFA) 108 (90 Base) MCG/ACT inhaler Inhale 2 puffs into the lungs every 6 (six) hours as needed for wheezing or shortness of breath. 3 Inhaler 1  . buPROPion (WELLBUTRIN) 100 MG tablet Take 2 tablets (200 mg total) by mouth 2 (two) times daily. 360 tablet 0  . cholecalciferol (VITAMIN D) 1000 UNITS tablet Take 5,000 Units by mouth 3 (three) times a week.     . Flaxseed, Linseed, (FLAX SEED OIL PO) Take by mouth 3 (three) times a week.     . hyoscyamine (LEVBID) 0.375 MG 12 hr tablet Take 0.375 mg by mouth daily.  0  . lisinopril (ZESTRIL) 10 MG tablet TAKE 1 TABLET(10 MG) BY MOUTH DAILY 90 tablet 1  . lithium carbonate (LITHOBID) 300 MG CR tablet Take 1 tablet (300 mg total) by mouth 2 (two) times daily. 180 tablet 0  . Multiple Vitamin (MULTIVITAMIN) tablet Take 1 tablet by mouth 3 (three) times a week.     Marland Kitchen PARoxetine (PAXIL) 30 MG tablet  Take 1 tablet (30 mg total) by mouth every morning. 90 tablet 0  . Zoster Vaccine Adjuvanted [facility]) injection Inject 0.5mg  IM now and again in 2-6 months. 0.5 mL 1   No current facility-administered medications on file prior to visit.       Family History: Reviewed  Family History   Problem  Relation  Age of Onset   .  Adopted: Yes   .  Aneurysm  Father     Psychiatric Specialty Exam:  Objective: Appearance:   Eye Contact::   Speech: Clear and Coherent and Normal Rate   Volume: Normal   Mood  Somewhat subdued  Affect: congruent  Thought Process: Goal Directed,  Logical and Loose   Orientation: Full   Thought Content: WDL   Suicidal Thoughts: Patient denies   Homicidal Thoughts: Patient denies   Judgement: Fair   Insight: Fair   Psychomotor Activity: Normal   Akathisia: No   Handed: Right   Memory: Immediate 3/3, Recent: 1/3   [place] of knowledge-average to above average.  AIMS (if indicated): Not indicated at this time.   Assets: Communication Skills  Desire for Improvement    Assessment:  AXIS I: Major Depressive Disorder, ; GAD: insomnia: Alcohol use disorder Treatment Plan/Recommendations:   Plan of Care:  Provided supportive therapy. Reviewed side effects . Sent prescriptions.       .   Medications:  Continue medications with no change::   Bipolar depression: somewhat subdued. Continue meds Anxiety: fluctuates, continue paxil Depression: fair. Continue welllbutrin.  No side effects reported . Last labs reviewed Planning to get into rehab and working on making calls, encouraged to follow thru Work on increasing acitivities Attend AA or other suppot groups  Discussed to remain abstinence from alcohol  Fu 43m. .     I discussed the assessment and treatment plan with the patient. The patient was provided an opportunity to ask questions and all were answered. The patient agreed with the plan and demonstrated an understanding of the instructions.   The patient was advised to call back or seek an in-person evaluation if the symptoms worsen or if the condition fails to improve as anticipated.  I provided 15 minutes of non-face-to-face time during this encounter.       Merian Capron, M.D.  07/24/2019 1:10 PM

## 2019-09-01 ENCOUNTER — Other Ambulatory Visit (HOSPITAL_COMMUNITY): Payer: Self-pay

## 2019-09-01 DIAGNOSIS — F332 Major depressive disorder, recurrent severe without psychotic features: Secondary | ICD-10-CM

## 2019-09-01 MED ORDER — PAROXETINE HCL 30 MG PO TABS: 30.0000 mg | ORAL_TABLET | Freq: Every morning | ORAL | 0 refills | Status: DC

## 2019-09-01 MED ORDER — BUPROPION HCL 100 MG PO TABS: 200.0000 mg | ORAL_TABLET | Freq: Two times a day (BID) | ORAL | 0 refills | Status: DC

## 2019-09-01 MED ORDER — LITHIUM CARBONATE ER 300 MG PO TBCR: 300.0000 mg | EXTENDED_RELEASE_TABLET | Freq: Two times a day (BID) | ORAL | 0 refills | Status: DC

## 2019-09-05 DIAGNOSIS — Z9884 Bariatric surgery status: Secondary | ICD-10-CM | POA: Diagnosis not present

## 2019-09-05 DIAGNOSIS — R1013 Epigastric pain: Secondary | ICD-10-CM | POA: Diagnosis not present

## 2019-09-05 DIAGNOSIS — K589 Irritable bowel syndrome without diarrhea: Secondary | ICD-10-CM | POA: Diagnosis not present

## 2019-10-23 ENCOUNTER — Ambulatory Visit (INDEPENDENT_AMBULATORY_CARE_PROVIDER_SITE_OTHER): Payer: Medicare Other | Admitting: Psychiatry

## 2019-10-23 ENCOUNTER — Encounter (HOSPITAL_COMMUNITY): Payer: Self-pay | Admitting: Psychiatry

## 2019-10-23 DIAGNOSIS — F411 Generalized anxiety disorder: Secondary | ICD-10-CM

## 2019-10-23 DIAGNOSIS — Z7289 Other problems related to lifestyle: Secondary | ICD-10-CM | POA: Diagnosis not present

## 2019-10-23 DIAGNOSIS — F3130 Bipolar disorder, current episode depressed, mild or moderate severity, unspecified: Secondary | ICD-10-CM

## 2019-10-23 DIAGNOSIS — G4700 Insomnia, unspecified: Secondary | ICD-10-CM | POA: Diagnosis not present

## 2019-10-23 DIAGNOSIS — F4010 Social phobia, unspecified: Secondary | ICD-10-CM

## 2019-10-23 DIAGNOSIS — F1020 Alcohol dependence, uncomplicated: Secondary | ICD-10-CM

## 2019-10-23 DIAGNOSIS — F332 Major depressive disorder, recurrent severe without psychotic features: Secondary | ICD-10-CM

## 2019-10-23 MED ORDER — BUPROPION HCL 100 MG PO TABS: 200.0000 mg | ORAL_TABLET | Freq: Two times a day (BID) | ORAL | 0 refills | Status: DC

## 2019-10-23 MED ORDER — PAROXETINE HCL 30 MG PO TABS: 30.0000 mg | ORAL_TABLET | Freq: Every morning | ORAL | 0 refills | Status: DC

## 2019-10-23 MED ORDER — LITHIUM CARBONATE ER 300 MG PO TBCR: 300.0000 mg | EXTENDED_RELEASE_TABLET | Freq: Two times a day (BID) | ORAL | 0 refills | Status: DC

## 2019-10-23 NOTE — Progress Notes (Signed)
Patient ID: Julie Powell, female   DOB: [birth year], 58 y.o.   MRN: [id number] [place] Follow-up Outpatient Visit  Julie Powell 11/23/1960    I connected with Julie Powell on 10/23/19 at  1:00 PM EST by telephone and verified that I am speaking with the correct person using two identifiers.   I discussed the limitations, risks, security and privacy concerns of performing an evaluation and management service by telephone and the availability of in person appointments. I also discussed with the patient that there may be a patient responsible charge related to this service. The patient expressed understanding and agreed to proceed.  Date:10/23/2019 No chief complaint on file. depression follow up  HPI Comments: Julie Powell is a 58  Y/O woman with a past psychiatric history Major Depressive Disorder, severe, recurrent. Mood disorder due to [facility], Alcohol use disorder and Social Phobia.    She had stopped and relapse after a month on alcohol Reluctant to join AA or rehab,   Says meds help her mood and depression. Stopped campral earlier and felt it didn't help craving Understands detox and programs available for help   Planning to join audio AA if possibile  Depression is  fair     . Duration: since young age . Timing: more so in evening  . Context: alcohol use history\ . Modifying factors: meds, son . Associated signs and symptoms: Denies psychosis   Traumatic Brain Injury: No none   Review of Systems  Cardiovascular: Negative for chest pain.  Skin: Negative for itching.  Neurological: Negative for headaches.  Psychiatric/Behavioral: Negative for substance abuse and suicidal ideas.   There were no vitals filed for this visit. Physical Exam  Constitutional: She appears well-developed and well-nourished. No distress.  Skin: She is not diaphoretic.  No tremors of hands noted while patient was interviewed.     Past Medical History: Reviewed  Past Medical  History:  Diagnosis Date  . Anxiety   . Asthma   . B12 deficiency   . Bipolar 1 disorder ([place]) 2010   ect  . Fibroids   . Hypertension   . Plantar fasciitis 2013  . Sciatica    RT hip  . Vitamin D deficiency     Allergies: No Known Allergies   Current Medications: Reviewed   Current Outpatient Medications on File Prior to Visit  Medication Sig Dispense Refill  . albuterol (VENTOLIN HFA) 108 (90 Base) MCG/ACT inhaler Inhale 2 puffs into the lungs every 6 (six) hours as needed for wheezing or shortness of breath. 3 Inhaler 1  . cholecalciferol (VITAMIN D) 1000 UNITS tablet Take 5,000 Units by mouth 3 (three) times a week.     . Flaxseed, Linseed, (FLAX SEED OIL PO) Take by mouth 3 (three) times a week.     . hyoscyamine (LEVBID) 0.375 MG 12 hr tablet Take 0.375 mg by mouth daily.  0  . lisinopril (ZESTRIL) 10 MG tablet TAKE 1 TABLET(10 MG) BY MOUTH DAILY 90 tablet 1  . Multiple Vitamin (MULTIVITAMIN) tablet Take 1 tablet by mouth 3 (three) times a week.     . Zoster Vaccine Adjuvanted [facility]) injection Inject 0.5mg  IM now and again in 2-6 months. 0.5 mL 1   No current facility-administered medications on file prior to visit.      Family History: Reviewed  Family History   Problem  Relation  Age of Onset   .  Adopted: Yes   .  Aneurysm  Father     Psychiatric  Specialty Exam:  Objective: Appearance:   Eye Contact::   Speech: Clear and Coherent and Normal Rate   Volume: Normal   Mood  fair  Affect: congruent  Thought Process: Goal Directed, Logical and Loose   Orientation: Full   Thought Content: WDL   Suicidal Thoughts: Patient denies   Homicidal Thoughts: Patient denies   Judgement: Fair   Insight: Fair   Psychomotor Activity: Normal   Akathisia: No   Handed: Right   Memory: Immediate 3/3, Recent: 1/3   [place] of knowledge-average to above average.  AIMS (if indicated): Not indicated at this time.   Assets: Communication Skills  Desire for  Improvement    Assessment:  AXIS I: Major Depressive Disorder, ; GAD: insomnia: Alcohol use disorder Treatment Plan/Recommendations:   Plan of Care:  Provided supportive therapy. Reviewed side effects . Sent prescriptions.       .   Medications:  Continue medications with no change::   Bipolar depression: fair, continue wellbutrin llithium Anxiety: fluctuates, continue paxil Depression: fair. Continue welllbutrin.  No side effects reported . Last labs reviewed Discussed rehab and AA programs encouraged to join Encouraged lihtium levels, denies any side effects for now Discussed to remain abstinence from alcohol  Fu 63m. .     I discussed the assessment and treatment plan with the patient. The patient was provided an opportunity to ask questions and all were answered. The patient agreed with the plan and demonstrated an understanding of the instructions.   The patient was advised to call back or seek an in-person evaluation if the symptoms worsen or if the condition fails to improve as anticipated.  I provided 15 minutes of non-face-to-face time during this encounter.       Merian Capron, M.D.  10/23/2019 1:07 PM

## 2019-11-26 NOTE — Progress Notes (Signed)
Virtual Visit via Audio Note  I connected with patient on 11/27/19 at 11:00 AM EST by audio enabled telemedicine application and verified that I am speaking with the correct person using two identifiers.   THIS ENCOUNTER IS A VIRTUAL VISIT DUE TO COVID-19 - PATIENT WAS NOT SEEN IN THE OFFICE. PATIENT HAS CONSENTED TO VIRTUAL VISIT / TELEMEDICINE VISIT   Location of patient: home  Location of provider: office  I discussed the limitations of evaluation and management by telemedicine and the availability of in person appointments. The patient expressed understanding and agreed to proceed.   Subjective:   Julie Powell is a 59 y.o. female who presents for Medicare Annual (Subsequent) preventive examination.  Review of Systems:  Home Safety/Smoke Alarms: Feels safe in home. Smoke alarms in place.  Lives alone in apt. Son visits 4-5x/ wk.   Female:   Pap- 07/09/19      Mammo-  06/10/19     Dexa scan- 04/03/17       CCS- pt reports 11/07/13- normal    Objective:     Vitals: BP 128/80 Comment: pt reported  LMP 09/11/2013   There is no height or weight on file to calculate BMI.  Advanced Directives 11/27/2019 04/07/2019 11/25/2018 11/22/2017 10/17/2016 12/29/2014 08/06/2014  Does Patient Have a Medical Advance Directive? No No No No No No No  Does patient want to make changes to medical advance directive? - - - - No - Patient declined - -  Would patient like information on creating a medical advance directive? No - Patient declined - No - Patient declined No - Patient declined - No - patient declined information No - patient declined information    Tobacco Social History   Tobacco Use  Smoking Status Former Smoker  . Packs/day: 0.50  . Years: 10.00  . Pack years: 5.00  . Types: Cigarettes  . Quit date: 11/07/1991  . Years since quitting: 28.0  Smokeless Tobacco Never Used     Counseling given: Not Answered   Clinical Intake: Pain : No/denies pain     Past Medical History:    Diagnosis Date  . Anxiety   . Asthma   . B12 deficiency   . Bipolar 1 disorder ([place]) 2010   ect  . Fibroids   . Hypertension   . Plantar fasciitis 2013  . Sciatica    RT hip  . Vitamin D deficiency    Past Surgical History:  Procedure Laterality Date  . CESAREAN SECTION  1995  . CYSTECTOMY  1998   "thyroglycol duct cyst in neck" had dysphagia, was told benign  . GASTRIC BYPASS  2003  . MICRODISCECTOMY LUMBAR  09/11/14   L3-L4  Dr Saintclair Halsted  . PILONIDAL CYST DRAINAGE  1982  . REDUCTION MAMMAPLASTY    . SP CHOLECYSTOMY  1986   Family History  Adopted: Yes  Problem Relation Age of Onset  . Aneurysm Father   . Alcohol abuse Father   . Alcohol abuse Brother   . Alcohol abuse Brother   . Cancer Brother        prostate  . Alcohol abuse Brother   . Lung cancer Sister   . Cancer Sister        lung  . Thyroid disease Neg Hx   . Diabetes Neg Hx   . Hypertension Neg Hx    Social History   Socioeconomic History  . Marital status: Divorced    Spouse name: Not on file  . Number of children:  1  . Years of education: Not on file  . Highest education level: Bachelor's degree (e.g., BA, AB, BS)  Occupational History  . Not on file  Tobacco Use  . Smoking status: Former Smoker    Packs/day: 0.50    Years: 10.00    Pack years: 5.00    Types: Cigarettes    Quit date: 11/07/1991    Years since quitting: 28.0  . Smokeless tobacco: Never Used  Substance and Sexual Activity  . Alcohol use: Yes    Alcohol/week: 0.0 standard drinks    Comment: stopped wine. Now beer. 12 per day.   . Drug use: Not Currently    Types: "Crack" cocaine    Comment: Caffeine- Carbonated beverage 16 ounces.  . Sexual activity: Never    Birth control/protection: Post-menopausal  Other Topics Concern  . Not on file  Social History Narrative   Lives with 44 year old son   She is on disability due to depression   In the past she has worked as a Education officer, community for state of  [place]   Former Mudlogger   Completed bachelor's degree   Divorced.   Enjoys television   Right handed    Apartment 1st floor    Social Determinants of Health   Financial Resource Strain: Low Risk   . Difficulty of Paying Living Expenses: Not hard at all  Food Insecurity: No Food Insecurity  . Worried About Charity fundraiser in the Last Year: Never true  . Ran Out of Food in the Last Year: Never true  Transportation Needs: No Transportation Needs  . Lack of Transportation (Medical): No  . Lack of Transportation (Non-Medical): No  Physical Activity:   . Days of Exercise per Week: Not on file  . Minutes of Exercise per Session: Not on file  Stress:   . Feeling of Stress : Not on file  Social Connections:   . Frequency of Communication with Friends and Family: Not on file  . Frequency of Social Gatherings with Friends and Family: Not on file  . Attends Religious Services: Not on file  . Active Member of Clubs or Organizations: Not on file  . Attends Archivist Meetings: Not on file  . Marital Status: Not on file    Outpatient Encounter Medications as of 11/27/2019  Medication Sig  . albuterol (VENTOLIN HFA) 108 (90 Base) MCG/ACT inhaler Inhale 2 puffs into the lungs every 6 (six) hours as needed for wheezing or shortness of breath.  Marland Kitchen buPROPion (WELLBUTRIN) 100 MG tablet Take 2 tablets (200 mg total) by mouth 2 (two) times daily.  . cholecalciferol (VITAMIN D) 1000 UNITS tablet Take 5,000 Units by mouth 3 (three) times a week.   . Flaxseed, Linseed, (FLAX SEED OIL PO) Take by mouth 3 (three) times a week.   . hyoscyamine (LEVBID) 0.375 MG 12 hr tablet Take 0.375 mg by mouth daily.  Marland Kitchen lisinopril (ZESTRIL) 10 MG tablet TAKE 1 TABLET(10 MG) BY MOUTH DAILY  . lithium carbonate (LITHOBID) 300 MG CR tablet Take 1 tablet (300 mg total) by mouth 2 (two) times daily.  . Multiple Vitamin (MULTIVITAMIN) tablet Take 1 tablet by mouth 3 (three)  times a week.   Marland Kitchen PARoxetine (PAXIL) 30 MG tablet Take 1 tablet (30 mg total) by mouth every morning.  Marland Kitchen Zoster Vaccine Adjuvanted [facility]) injection Inject 0.5mg  IM now and again in 2-6 months.   No facility-administered encounter medications on file as  of 11/27/2019.    Activities of Daily Living In your present state of health, do you have any difficulty performing the following activities: 11/27/2019  Hearing? N  Vision? N  Difficulty concentrating or making decisions? N  Walking or climbing stairs? N  Dressing or bathing? N  Doing errands, shopping? N  Preparing Food and eating ? N  Using the Toilet? N  In the past six months, have you accidently leaked urine? N  Do you have problems with loss of bowel control? N  Managing your Medications? N  Managing your Finances? N  Housekeeping or managing your Housekeeping? N  Some recent data might be hidden    Patient Care Team: [name], NP as PCP - General (Internal Medicine) Merian Capron, MD as Consulting Physician (Psychiatry) Renato Shin, MD as Consulting Physician (Endocrinology) Lucienne Capers, MD as Consulting Physician (Gastroenterology) Cameron Sprang, MD as Consulting Physician (Neurology)    Assessment:   This is a routine wellness examination for Charrie. Physical assessment deferred to PCP.'  Exercise Activities and Dietary recommendations Current Exercise Habits: Home exercise routine, Type of exercise: walking, Time (Minutes): 10, Frequency (Times/Week): 1, Weekly Exercise (Minutes/Week): 10, Intensity: Mild, Exercise limited by: None identified Diet (meal preparation, eat out, water intake, caffeinated beverages, dairy products, fruits and vegetables): well balanced   Goals    . Increase physical activity    . Increase water intake    . Reduce alcohol intake    . Stop drinking so much (pt-stated)       Fall Risk Fall Risk  11/27/2019 04/07/2019 11/25/2018 11/22/2017 10/17/2016  Falls in the  past year? 0 1 1 No No  Number falls in past yr: 0 1 1 - -  Injury with Fall? 0 1 - - -  Risk for fall due to : - - Impaired balance/gait - -   Depression Screen PHQ 2/9 Scores 11/27/2019 11/25/2018 11/25/2018 02/05/2018  PHQ - 2 Score 1 4 6 4   PHQ- 9 Score - 16 18 16      Cognitive Function Ad8 score reviewed for issues:  Issues making decisions:no  Less interest in hobbies / activities:no  Repeats questions, stories (family complaining):no  Trouble using ordinary gadgets (microwave, computer, phone):no  Forgets the month or year: no  Mismanaging finances: no  Remembering appts:no  Daily problems with thinking and/or memory:no Ad8 score is=0     MMSE - Mini Mental State Exam 11/22/2017 10/17/2016  Orientation to time 5 5  Orientation to Place 5 5  Registration 3 3  Attention/ Calculation 5 5  Recall 3 2  Language- name 2 objects 2 2  Language- repeat 1 1  Language- follow 3 step command 3 3  Language- read & follow direction 1 1  Write a sentence 1 1  Copy design 1 1  Total score 30 29        Immunization History  Administered Date(s) Administered  . Influenza,inj,Quad PF,6+ Mos 08/12/2014, 09/22/2015, 09/13/2016, 08/07/2017, 10/29/2018, 07/09/2019  . Pneumococcal Polysaccharide-23 08/29/2011  . Tdap 11/13/2014    Screening Tests Health Maintenance  Topic Date Due  . DEXA SCAN  04/04/2019  . MAMMOGRAM  06/09/2021  . PAP SMEAR-Modifier  07/08/2022  . COLONOSCOPY  11/08/2023  . TETANUS/TDAP  11/13/2024  . INFLUENZA VACCINE  Completed  . Hepatitis C Screening  Completed  . HIV Screening  Completed       Plan:   See you next year!  Continue to eat heart healthy diet (full of  fruits, vegetables, whole grains, lean protein, water--limit salt, fat, and sugar intake) and increase physical activity as tolerated.  Continue doing brain stimulating activities (puzzles, reading, adult coloring books, staying active) to keep memory sharp.   Bring a copy of  your living will and/or healthcare power of attorney to your next office visit.    I have personally reviewed and noted the following in the patient's chart:   . Medical and social history . Use of alcohol, tobacco or illicit drugs  . Current medications and supplements . Functional ability and status . Nutritional status . Physical activity . Advanced directives . List of other physicians . Hospitalizations, surgeries, and ER visits in previous 12 months . Vitals . Screenings to include cognitive, depression, and falls . Referrals and appointments  In addition, I have reviewed and discussed with patient certain preventive protocols, quality metrics, and best practice recommendations. A written personalized care plan for preventive services as well as general preventive health recommendations were provided to patient.     Shela Nevin, [place]  11/27/2019

## 2019-11-27 ENCOUNTER — Encounter: Payer: Self-pay | Admitting: *Deleted

## 2019-11-27 ENCOUNTER — Other Ambulatory Visit: Payer: Self-pay

## 2019-11-27 ENCOUNTER — Ambulatory Visit (INDEPENDENT_AMBULATORY_CARE_PROVIDER_SITE_OTHER): Payer: Medicare Other | Admitting: *Deleted

## 2019-11-27 VITALS — BP 128/80

## 2019-11-27 DIAGNOSIS — Z0000 Encounter for general adult medical examination without abnormal findings: Secondary | ICD-10-CM | POA: Diagnosis not present

## 2019-11-27 NOTE — Patient Instructions (Signed)
See you next year!  Continue to eat heart healthy diet (full of fruits, vegetables, whole grains, lean protein, water--limit salt, fat, and sugar intake) and increase physical activity as tolerated.  Continue doing brain stimulating activities (puzzles, reading, adult coloring books, staying active) to keep memory sharp.   Bring a copy of your living will and/or healthcare power of attorney to your next office visit.   Julie Powell , Thank you for taking time to come for your Medicare Wellness Visit. I appreciate your ongoing commitment to your health goals. Please review the following plan we discussed and let me know if I can assist you in the future.   These are the goals we discussed: Goals    . Increase physical activity    . Increase water intake    . Reduce alcohol intake    . Stop drinking so much (pt-stated)       This is a list of the screening recommended for you and due dates:  Health Maintenance  Topic Date Due  . DEXA scan (bone density measurement)  04/04/2019  . Mammogram  06/09/2021  . Pap Smear  07/08/2022  . Colon Cancer Screening  11/08/2023  . Tetanus Vaccine  11/13/2024  . Flu Shot  Completed  .  Hepatitis C: One time screening is recommended by Center for Disease Control  (CDC) for  adults born from [birth year] through 1963.   Completed  . HIV Screening  Completed    Preventive Care 37-38 Years Old, Female Preventive care refers to visits with your health care provider and lifestyle choices that can promote health and wellness. This includes:  A yearly physical exam. This may also be called an annual well check.  Regular dental visits and eye exams.  Immunizations.  Screening for certain conditions.  Healthy lifestyle choices, such as eating a healthy diet, getting regular exercise, not using drugs or products that contain nicotine and tobacco, and limiting alcohol use. What can I expect for my preventive care visit? Physical exam Your health care provider  will check your:  Height and weight. This may be used to calculate body mass index (BMI), which tells if you are at a healthy weight.  Heart rate and blood pressure.  Skin for abnormal spots. Counseling Your health care provider may ask you questions about your:  Alcohol, tobacco, and drug use.  Emotional well-being.  Home and relationship well-being.  Sexual activity.  Eating habits.  Work and work Statistician.  Method of birth control.  Menstrual cycle.  Pregnancy history. What immunizations do I need?  Influenza (flu) vaccine  This is recommended every year. Tetanus, diphtheria, and pertussis (Tdap) vaccine  You may need a Td booster every 10 years. Varicella (chickenpox) vaccine  You may need this if you have not been vaccinated. Zoster (shingles) vaccine  You may need this after age 12. Measles, mumps, and rubella (MMR) vaccine  You may need at least one dose of MMR if you were born in 1957 or later. You may also need a second dose. Pneumococcal conjugate (PCV13) vaccine  You may need this if you have certain conditions and were not previously vaccinated. Pneumococcal polysaccharide (PPSV23) vaccine  You may need one or two doses if you smoke cigarettes or if you have certain conditions. Meningococcal conjugate (MenACWY) vaccine  You may need this if you have certain conditions. Hepatitis A vaccine  You may need this if you have certain conditions or if you travel or work in places where you  may be exposed to hepatitis A. Hepatitis B vaccine  You may need this if you have certain conditions or if you travel or work in places where you may be exposed to hepatitis B. Haemophilus influenzae type b (Hib) vaccine  You may need this if you have certain conditions. Human papillomavirus (HPV) vaccine  If recommended by your health care provider, you may need three doses over 6 months. You may receive vaccines as individual doses or as more than one  vaccine together in one shot (combination vaccines). Talk with your health care provider about the risks and benefits of combination vaccines. What tests do I need? Blood tests  Lipid and cholesterol levels. These may be checked every 5 years, or more frequently if you are over 75 years old.  Hepatitis C test.  Hepatitis B test. Screening  Lung cancer screening. You may have this screening every year starting at age 62 if you have a 30-pack-year history of smoking and currently smoke or have quit within the past 15 years.  Colorectal cancer screening. All adults should have this screening starting at age 77 and continuing until age 83. Your health care provider may recommend screening at age 74 if you are at increased risk. You will have tests every 1-10 years, depending on your results and the type of screening test.  Diabetes screening. This is done by checking your blood sugar (glucose) after you have not eaten for a while (fasting). You may have this done every 1-3 years.  Mammogram. This may be done every 1-2 years. Talk with your health care provider about when you should start having regular mammograms. This may depend on whether you have a family history of breast cancer.  BRCA-related cancer screening. This may be done if you have a family history of breast, ovarian, tubal, or peritoneal cancers.  Pelvic exam and Pap test. This may be done every 3 years starting at age 62. Starting at age 91, this may be done every 5 years if you have a Pap test in combination with an HPV test. Other tests  Sexually transmitted disease (STD) testing.  Bone density scan. This is done to screen for osteoporosis. You may have this scan if you are at high risk for osteoporosis. Follow these instructions at home: Eating and drinking  Eat a diet that includes fresh fruits and vegetables, whole grains, lean protein, and low-fat dairy.  Take vitamin and mineral supplements as recommended by your  health care provider.  Do not drink alcohol if: ? Your health care provider tells you not to drink. ? You are pregnant, may be pregnant, or are planning to become pregnant.  If you drink alcohol: ? Limit how much you have to 0-1 drink a day. ? Be aware of how much alcohol is in your drink. In the [place] one drink equals one 12 oz bottle of beer (355 mL), one 5 oz glass of wine (148 mL), or one 1 oz glass of hard liquor (44 mL). Lifestyle  Take daily care of your teeth and gums.  Stay active. Exercise for at least 30 minutes on 5 or more days each week.  Do not use any products that contain nicotine or tobacco, such as cigarettes, e-cigarettes, and chewing tobacco. If you need help quitting, ask your health care provider.  If you are sexually active, practice safe sex. Use a condom or other form of birth control (contraception) in order to prevent pregnancy and STIs (sexually transmitted infections).  If told  by your health care provider, take low-dose aspirin daily starting at age 21. What's next?  Visit your health care provider once a year for a well check visit.  Ask your health care provider how often you should have your eyes and teeth checked.  Stay up to date on all vaccines. This information is not intended to replace advice given to you by your health care provider. Make sure you discuss any questions you have with your health care provider. Document Revised: 07/04/2018 Document Reviewed: 07/04/2018 Elsevier Patient Education  2020 [employer].

## 2019-12-15 ENCOUNTER — Other Ambulatory Visit: Payer: Self-pay

## 2019-12-15 ENCOUNTER — Telehealth: Payer: Self-pay | Admitting: Family

## 2019-12-15 DIAGNOSIS — I10 Essential (primary) hypertension: Secondary | ICD-10-CM

## 2019-12-15 MED ORDER — LISINOPRIL 10 MG PO TABS: ORAL_TABLET | ORAL | 0 refills | Status: DC

## 2019-12-15 NOTE — Telephone Encounter (Signed)
Refill sent, patient due for follow up in March

## 2019-12-15 NOTE — Telephone Encounter (Signed)
Medication:lisinopril (ZESTRIL) 10 MG tablet    Has the patient contacted their pharmacy? Yes.    Preferred Pharmacy    [id number] - [place], [place] - [address] [place] [address] (Ph: [phone])  Order Details    Agent: Please be advised that RX refills may take up to 3 business days. We ask that you follow-up with your pharmacy.

## 2020-01-20 ENCOUNTER — Other Ambulatory Visit: Payer: Self-pay

## 2020-01-20 ENCOUNTER — Encounter: Payer: Self-pay | Admitting: Family

## 2020-01-20 ENCOUNTER — Ambulatory Visit (INDEPENDENT_AMBULATORY_CARE_PROVIDER_SITE_OTHER): Payer: Medicare Other | Admitting: Family

## 2020-01-20 VITALS — BP 120/70 | Ht 64.0 in | Wt 279.0 lb

## 2020-01-20 DIAGNOSIS — F1010 Alcohol abuse, uncomplicated: Secondary | ICD-10-CM

## 2020-01-20 DIAGNOSIS — F329 Major depressive disorder, single episode, unspecified: Secondary | ICD-10-CM | POA: Diagnosis not present

## 2020-01-20 DIAGNOSIS — E538 Deficiency of other specified B group vitamins: Secondary | ICD-10-CM

## 2020-01-20 DIAGNOSIS — E785 Hyperlipidemia, unspecified: Secondary | ICD-10-CM | POA: Diagnosis not present

## 2020-01-20 DIAGNOSIS — I10 Essential (primary) hypertension: Secondary | ICD-10-CM

## 2020-01-20 DIAGNOSIS — F32A Depression, unspecified: Secondary | ICD-10-CM

## 2020-01-20 MED ORDER — ALBUTEROL SULFATE HFA 108 (90 BASE) MCG/ACT IN AERS: 2.0000 | INHALATION_SPRAY | Freq: Four times a day (QID) | RESPIRATORY_TRACT | 1 refills | Status: DC | PRN

## 2020-01-20 NOTE — Progress Notes (Signed)
Virtual Visit via Video Note  I connected with Julie Powell on 01/20/20 at 10:00 AM EDT by a video enabled telemedicine application and verified that I am speaking with the correct person using two identifiers.  Location: Patient: home Provider: office   I discussed the limitations of evaluation and management by telemedicine and the availability of in person appointments. The patient expressed understanding and agreed to proceed.  History of Present Illness:  Patient is a 59 yr old female who presents today for follow up.  HTN-  Maintained on lisinopril 10mg  once daily.  Wt Readings from Last 3 Encounters:  01/19/20 279 lb (126.6 kg)  07/22/19 275 lb (124.7 kg)  07/09/19 272 lb 3.2 oz (123.5 kg)    BP Readings from Last 3 Encounters:  01/19/20 120/70  11/27/19 128/80  07/22/19 138/73   Hyperlipidemia- not on statin.  Lab Results  Component Value Date   CHOL 248 (H) 07/22/2019   HDL 160.90 07/22/2019   LDLCALC 73 07/22/2019   TRIG 70.0 07/22/2019   CHOLHDL 2 07/22/2019   b12 deficiency- reports that she is taking b12 500 mg SL- only taking a few times a week.   Depression- reports that she is isolating herself.  Still following with psychiatry.  Reports "nothing has changed."  Alcohol abuse- continues to drink excessive amounts of beer.      Observations/Objective:   Gen: Awake, alert, no acute distress Resp: Breathing is even and non-labored Psych: calm/pleasant demeanor. Flat affect Neuro: Alert and Oriented x 3, + facial symmetry, speech is clear.   Assessment and Plan:  1) HTN- bp stable on current dose of lisinopril. Continue same.  2) Hyperlipidemia- check follow up lipid panel.  3) Depression- uncontrolled. I suggested that she establish with a counselor and gave her the number to [facility].  4) b12 deficency- check b12 levels. If low plan to initiate b12 injections.  5) Alcohol abuse- I think that her uncontrolled depression is  contributing. I am hopeful that she will find counseling helpful. We discussed that ongoing alcohol abuse can be dangerous and ultimately fatal for some people.   Follow Up Instructions:    I discussed the assessment and treatment plan with the patient. The patient was provided an opportunity to ask questions and all were answered. The patient agreed with the plan and demonstrated an understanding of the instructions.   The patient was advised to call back or seek an in-person evaluation if the symptoms worsen or if the condition fails to improve as anticipated.  Nance Pear, NP

## 2020-01-30 ENCOUNTER — Telehealth: Payer: Self-pay | Admitting: Family

## 2020-01-30 NOTE — Progress Notes (Signed)
  Chronic Care Management   Outreach Note  01/30/2020 Name: Julie Powell MRN: [id number] DOB: 04-23-1961  Referred by: [name], NP Reason for referral : No chief complaint on file.   An unsuccessful telephone outreach was attempted today. The patient was referred to the pharmacist for assistance with care management and care coordination.   Follow Up Plan:   [name] [employer] Scheduler

## 2020-02-03 ENCOUNTER — Other Ambulatory Visit (INDEPENDENT_AMBULATORY_CARE_PROVIDER_SITE_OTHER): Payer: Medicare Other

## 2020-02-03 ENCOUNTER — Other Ambulatory Visit: Payer: Self-pay

## 2020-02-03 DIAGNOSIS — E538 Deficiency of other specified B group vitamins: Secondary | ICD-10-CM

## 2020-02-03 DIAGNOSIS — E785 Hyperlipidemia, unspecified: Secondary | ICD-10-CM

## 2020-02-03 DIAGNOSIS — F1010 Alcohol abuse, uncomplicated: Secondary | ICD-10-CM | POA: Diagnosis not present

## 2020-02-03 LAB — COMPREHENSIVE METABOLIC PANEL
ALT: 14 U/L (ref 0–35)
AST: 18 U/L (ref 0–37)
Albumin: 4 g/dL (ref 3.5–5.2)
Alkaline Phosphatase: 157 U/L — ABNORMAL HIGH (ref 39–117)
BUN: 7 mg/dL (ref 6–23)
CO2: 28 mEq/L (ref 19–32)
Calcium: 9.7 mg/dL (ref 8.4–10.5)
Chloride: 103 mEq/L (ref 96–112)
Creatinine, Ser: 0.78 mg/dL (ref 0.40–1.20)
GFR: 92.29 mL/min (ref >60.00)
Glucose, Bld: 97 mg/dL (ref 70–99)
Potassium: 4.4 mEq/L (ref 3.5–5.1)
Sodium: 138 mEq/L (ref 135–145)
Total Bilirubin: 0.6 mg/dL (ref 0.2–1.2)
Total Protein: 6.8 g/dL (ref 6.0–8.3)

## 2020-02-03 LAB — LIPID PANEL
Cholesterol: 254 mg/dL — ABNORMAL HIGH (ref 0–200)
HDL: 156.5 mg/dL (ref >39.00)
LDL Cholesterol: 83 mg/dL (ref 0–99)
NonHDL: 97.74
Total CHOL/HDL Ratio: 2
Triglycerides: 72 mg/dL (ref 0.0–149.0)
VLDL: 14.4 mg/dL (ref 0.0–40.0)

## 2020-02-03 LAB — B12 AND FOLATE PANEL
Folate: 11.1 ng/mL (ref >5.9)
Vitamin B-12: 809 pg/mL (ref 211–911)

## 2020-02-04 ENCOUNTER — Other Ambulatory Visit: Payer: Self-pay | Admitting: Emergency Medicine

## 2020-02-04 ENCOUNTER — Other Ambulatory Visit: Payer: Medicare Other

## 2020-02-04 ENCOUNTER — Telehealth: Payer: Self-pay | Admitting: Family

## 2020-02-04 DIAGNOSIS — R748 Abnormal levels of other serum enzymes: Secondary | ICD-10-CM

## 2020-02-04 NOTE — Progress Notes (Signed)
°  Chronic Care Management   Note  02/04/2020 Name: Jontue Lucido MRN: [id number] DOB: 1961-08-26  Whisper Panasuk is a 59 y.o. year old female who is a primary care patient of [name], NP. I reached out to [place] by phone today in response to a referral sent by Ms. Anam Hinners's PCP, [name], NP.   Ms. Covin was given information about Chronic Care Management services today including:  1. CCM service includes personalized support from designated clinical staff supervised by her physician, including individualized plan of care and coordination with other care providers 2. 24/7 contact phone numbers for assistance for urgent and routine care needs. 3. Service will only be billed when office clinical staff spend 20 minutes or more in a month to coordinate care. 4. Only one practitioner may furnish and bill the service in a calendar month. 5. The patient may stop CCM services at any time (effective at the end of the month) by phone call to the office staff.   Patient agreed to services and verbal consent obtained.   Follow up plan:   [name] [employer] Scheduler

## 2020-02-08 LAB — ALKALINE PHOSPHATASE ISOENZYMES
Alkaline phosphatase (APISO): 165 U/L — ABNORMAL HIGH (ref 37–153)
Bone Isoenzymes: 13 % — ABNORMAL LOW (ref 28–66)
Intestinal Isoenzymes: 2 % (ref 1–24)
Liver Isoenzymes: 85 % — ABNORMAL HIGH (ref 25–69)

## 2020-02-11 ENCOUNTER — Telehealth: Payer: Self-pay | Admitting: Family

## 2020-02-11 ENCOUNTER — Encounter: Payer: Self-pay | Admitting: Family

## 2020-02-11 NOTE — Telephone Encounter (Signed)
-----  Message from Stacey A Blyth, MD sent at 02/10/2020  1:07 PM EDT ----- Would probably just recheck fasting in a couple of months ----- Message ----- From: O'Sullivan, Melissa, NP Sent: 02/10/2020  10:12 AM EDT To: Stacey A Blyth, MD  Quick question- this pt has an elevated Alk Phos (liver per isoenzymes).  S/p cholecystectomy. She is morbidly obese with a hx of alcohol abuse. Would you just watch this or do any further testing.  Thanks,  Melissa    

## 2020-02-24 ENCOUNTER — Encounter (HOSPITAL_COMMUNITY): Payer: Self-pay | Admitting: Psychiatry

## 2020-02-24 ENCOUNTER — Ambulatory Visit (INDEPENDENT_AMBULATORY_CARE_PROVIDER_SITE_OTHER): Payer: Medicare Other | Admitting: Psychiatry

## 2020-02-24 DIAGNOSIS — F332 Major depressive disorder, recurrent severe without psychotic features: Secondary | ICD-10-CM

## 2020-02-24 DIAGNOSIS — F1020 Alcohol dependence, uncomplicated: Secondary | ICD-10-CM | POA: Diagnosis not present

## 2020-02-24 DIAGNOSIS — F3130 Bipolar disorder, current episode depressed, mild or moderate severity, unspecified: Secondary | ICD-10-CM | POA: Diagnosis not present

## 2020-02-24 NOTE — Progress Notes (Signed)
Patient ID: Julie Powell, female   DOB: 13-Jan-1961, 59 y.o.   MRN: [id number] [facility] Follow-up Outpatient Visit  Kamaia Quiocho 02-25-1961  Date: 02/24/20   I connected with Julie Powell on 02/24/20 at  3:30 PM EDT by telephone and verified that I am speaking with the correct person using two identifiers.   I discussed the limitations, risks, security and privacy concerns of performing an evaluation and management service by telephone and the availability of in person appointments. I also discussed with the patient that there may be a patient responsible charge related to this service. The patient expressed understanding and agreed to proceed.  CC  depression follow up  HPI Comments: Ms. Garfinkle is a 59  Y/O woman with a past psychiatric history Major Depressive Disorder, severe, recurrent. Mood disorder due to [facility], Alcohol use disorder and Social Phobia.    She has again stopped alcohol for last few weeks, says doing fair but has craving, tries to keep busy but avoids going out  Stopped campral earlier and felt it didn't help craving Understands detox and programs available for help   Planning to join audio AA if possibile  Depression; fair Aggravating factor: lonliness Modifying factor: housing,   . Duration: since young age . Timing: more so in evening  . Context: alcohol use history\ . Modifying factors: meds, son . Associated signs and symptoms: Denies psychosis   Traumatic Brain Injury: No none   Review of Systems  Cardiovascular: Negative for chest pain.  Skin: Negative for itching.  Neurological: Negative for headaches.  Psychiatric/Behavioral: Negative for substance abuse and suicidal ideas.   There were no vitals filed for this visit. Physical Exam  Constitutional: She appears well-developed and well-nourished. No distress.  Skin: She is not diaphoretic.  No tremors of hands noted while patient was interviewed.     Past Medical History:  Reviewed  Past Medical History:  Diagnosis Date  . Anxiety   . Asthma   . B12 deficiency   . Bipolar 1 disorder ([place]) 2010   ect  . Fibroids   . Hypertension   . Plantar fasciitis 2013  . Sciatica    RT hip  . Vitamin D deficiency     Allergies: No Known Allergies   Current Medications: Reviewed   Current Outpatient Medications on File Prior to Visit  Medication Sig Dispense Refill  . albuterol (PROAIR HFA) 108 (90 Base) MCG/ACT inhaler Inhale 2 puffs into the lungs every 6 (six) hours as needed for wheezing or shortness of breath. 20.1 g 1  . buPROPion (WELLBUTRIN) 100 MG tablet Take 2 tablets (200 mg total) by mouth 2 (two) times daily. 360 tablet 0  . cholecalciferol (VITAMIN D) 1000 UNITS tablet Take 5,000 Units by mouth 3 (three) times a week.     . Flaxseed, Linseed, (FLAX SEED OIL PO) Take by mouth 3 (three) times a week.     . hyoscyamine (LEVBID) 0.375 MG 12 hr tablet Take 0.375 mg by mouth daily.  0  . lisinopril (ZESTRIL) 10 MG tablet TAKE 1 TABLET(10 MG) BY MOUTH DAILY 90 tablet 0  . lithium carbonate (LITHOBID) 300 MG CR tablet Take 1 tablet (300 mg total) by mouth 2 (two) times daily. 180 tablet 0  . Multiple Vitamin (MULTIVITAMIN) tablet Take 1 tablet by mouth 3 (three) times a week.     Marland Kitchen PARoxetine (PAXIL) 30 MG tablet Take 1 tablet (30 mg total) by mouth every morning. 90 tablet 0  . Zoster  Vaccine Adjuvanted [facility]) injection Inject 0.5mg  IM now and again in 2-6 months. 0.5 mL 1   No current facility-administered medications on file prior to visit.      Family History: Reviewed  Family History   Problem  Relation  Age of Onset   .  Adopted: Yes   .  Aneurysm  Father     Psychiatric Specialty Exam:  Objective: Appearance:   Eye Contact::   Speech: Clear and Coherent and Normal Rate   Volume: Normal   Mood  fair  Affect: congruent  Thought Process: Goal Directed, Logical and Loose   Orientation: Full   Thought Content: WDL   Suicidal  Thoughts: Patient denies   Homicidal Thoughts: Patient denies   Judgement: Fair   Insight: Fair   Psychomotor Activity: Normal   Akathisia: No   Handed: Right   Memory: Immediate 3/3, Recent: 1/3   [place] of knowledge-average to above average.  AIMS (if indicated): Not indicated at this time.   Assets: Communication Skills  Desire for Improvement    Assessment:  AXIS I: Major Depressive Disorder, ; GAD: insomnia: Alcohol use disorder Treatment Plan/Recommendations:   Plan of Care:  Provided supportive therapy. Reviewed side effects . Sent prescriptions.       .   Medications:  Continue medications with no change::   Bipolar depression: fair, continue lithium paxil Will send blood request for lithium levels  Anxiety: fluctuates, continue paxil Depression: fair. Continue welllbutrin.  Alcohol useL discussed to join audio AA , relapse prevention  Discussed to remain abstinence from alcohol  Fu 106m. .     I discussed the assessment and treatment plan with the patient. The patient was provided an opportunity to ask questions and all were answered. The patient agreed with the plan and demonstrated an understanding of the instructions.   The patient was advised to call back or seek an in-person evaluation if the symptoms worsen or if the condition fails to improve as anticipated.  I provided 15 minutes of non-face-to-face time during this encounter.       Merian Capron, M.D.  02/24/2020 3:41 PM

## 2020-02-26 ENCOUNTER — Ambulatory Visit: Payer: Medicare Other | Admitting: Pharmacist

## 2020-02-26 ENCOUNTER — Other Ambulatory Visit: Payer: Self-pay

## 2020-02-26 DIAGNOSIS — F1010 Alcohol abuse, uncomplicated: Secondary | ICD-10-CM

## 2020-02-26 DIAGNOSIS — Z0000 Encounter for general adult medical examination without abnormal findings: Secondary | ICD-10-CM

## 2020-02-26 DIAGNOSIS — E785 Hyperlipidemia, unspecified: Secondary | ICD-10-CM

## 2020-02-26 NOTE — Chronic Care Management (AMB) (Signed)
Chronic Care Management Pharmacy  Name: Julie Powell  MRN: [id number] DOB: 1961/03/29  Chief Complaint/ HPI  Julie Powell,  59 y.o. , female presents for their Initial CCM visit with the clinical pharmacist via telephone due to COVID-19 Pandemic.  PCP : [name], NP  Their chronic conditions include: Pre-DM, HTN, HLD, Asthma, Depression, Alcohol Use Disorder, Vitamin D Deficiency, Stomach Pain  Office Visits: 01/20/20: Visit w/ [name], NP - Depression uncontrolled. Recommended pt go to counseling. Alcohol use still occurring most likely due to uncontrolled depression. No med changes noted.   11/27/19: Medicare Annual Wellness Exam w/ Naaman Plummer, RN - Updated goals to increase physical activity, increase water intake, reduce alcohol intake.   Consult Visit: 02/24/20: Psych visit w/ Dr. De Nurse - MDD. Working on sobriety. Has stopped drinking for last few weeks. Planning to join audio AA if possible. Supportive therapy provided. No med changes noted.   10/23/19: Psych visit w/ Dr. De Nurse - MDD. Stopped drinking and relapsed after a month. Supportive therapy provided. No med changes noted.   09/05/19: Gastro visit w/ Dr. Hassell Done   Medications: Outpatient Encounter Medications as of 02/26/2020  Medication Sig Note  . albuterol (PROAIR HFA) 108 (90 Base) MCG/ACT inhaler Inhale 2 puffs into the lungs every 6 (six) hours as needed for wheezing or shortness of breath.   Marland Kitchen buPROPion (WELLBUTRIN) 100 MG tablet Take 2 tablets (200 mg total) by mouth 2 (two) times daily.   . cholecalciferol (VITAMIN D) 1000 UNITS tablet Take 1,000 Units by mouth 3 (three) times a week.    . hyoscyamine (LEVBID) 0.375 MG 12 hr tablet Take 0.375 mg by mouth daily. 12/21/2015: Received from: External Pharmacy  . lisinopril (ZESTRIL) 10 MG tablet TAKE 1 TABLET(10 MG) BY MOUTH DAILY   . lithium carbonate (LITHOBID) 300 MG CR tablet Take 1 tablet (300 mg total) by mouth 2 (two) times daily.     . Multiple Vitamin (MULTIVITAMIN) tablet Take 1 tablet by mouth 3 (three) times a week.    Marland Kitchen PARoxetine (PAXIL) 30 MG tablet Take 1 tablet (30 mg total) by mouth every morning.   . Flaxseed, Linseed, (FLAX SEED OIL PO) Take by mouth 3 (three) times a week.    . Zoster Vaccine Adjuvanted [facility]) injection Inject 0.5mg  IM now and again in 2-6 months.    No facility-administered encounter medications on file as of 02/26/2020.    Current Diagnosis/Assessment:  Goals Addressed            This Visit's Progress   . Alcohol Use: Patient will decrease/stop alcohol use       CARE PLAN ENTRY (see longitudinal plan of care for additional care plan information)  Current Barriers:  . Alcohol use complicated by stress  Pharmacist Clinical Goal(s):  Marland Kitchen Over the next 180 days, patient will work with PharmD and providers to assist patient with reducing or eliminating alcohol use  Interventions: . Comprehensive medication review completed; medication list updated in electronic medical record.  Bertram Savin care team collaboration (see longitudinal plan of care) . Recommended patient to discuss option of Naltrexone with Dr. De Nurse  Patient Self Care Activities:  . Over next 180 days patient will discuss Naltrexone with Dr. De Nurse   Initial goal documentation     . Consider completing a1c lab at next visit on 04/21/20      . Consider completing vitamin D lab at next visit on 04/21/20      . Hyperlipidemia: LDL goal <100, Total  Cholesterol goal <200       CARE PLAN ENTRY (see longitudinal plan of care for additional care plan information)  Current Barriers:  . Uncontrolled hyperlipidemia, complicated by alcohol use disorder . Current antihyperlipidemic regimen: none . Previous antihyperlipidemic medications tried None noted  . Most recent lipid panel:     Component Value Date/Time   CHOL 254 (H) 02/03/2020 1015   TRIG 72.0 02/03/2020 1015   HDL 156.50 02/03/2020 1015   CHOLHDL  2 02/03/2020 1015   VLDL 14.4 02/03/2020 1015   LDLCALC 83 02/03/2020 1015   . 10-year ASCVD risk score: The ASCVD Risk score [name], et al., 2013) failed to calculate for the following reasons: .   The valid HDL cholesterol range is 20 to 100 mg/dL .   Pharmacist Clinical Goal(s):  Marland Kitchen Over the next 180 days, patient will work with PharmD and providers towards optimized antihyperlipidemic therapy  Interventions: . Comprehensive medication review performed; medication list updated in electronic medical record.  Bertram Savin care team collaboration (see longitudinal plan of care) . Medication recommendation to assist with alcohol use disorder . Discussed diet (limiting carbohydrates) and exercise (150 minutes per week)  Patient Self Care Activities:  . Over the next 180 days patient will focus on discussing medication options with Dr.Akhtar for alcohol use disorder . Over the next 180 days patient will focus on increasing exercise with goal of 150 minutes per week . Over the next 180 days patient will focus on limiting carbohydrates in her diet (30-45 grams per meal)  Initial goal documentation     . Pharmacy Care Plan       CARE PLAN ENTRY  Current Barriers:  . Chronic Disease Management support, education, and care coordination needs related to Pre-DM, HTN, HLD, Asthma, Depression, Alcohol Use Disorder, Vitamin D Deficiency, Stomach Pain  Pharmacist Clinical Goal(s):  Marland Kitchen Assist patient to reduce or eliminate alcohol use . Over next 60 days consider completion of vitamin D lab at next visit . Over next 60 days consider completion of a1c lab at next visit . Over next 180 days patient will maintain A1c <6.5% . Over next 180 days patient will maintain BP <140/90 . Over next 180 days patient will reach goal of LDL <100, TC <200  Interventions: . Comprehensive medication review performed. . Recommend pt to discuss naltrexone for alcohol use disorder with Dr.  De Nurse . Recommend pt to complete COVID vaccine series . Recommend pt to complete Shingrix vaccine series . Discussed diet and exercise thoroughly with respect to pre-diabetes and hyperlipidemia  Patient Self Care Activities:  . Patient verbalizes understanding of plan to follow as described above, Self administers medications as prescribed, Calls pharmacy for medication refills, and Calls provider office for new concerns or questions over the next 180 days . Over next 180 days complete COVID vaccine series . Over next 180 days complete Shingrix vaccine series . Over next 180 days patient will discuss Naltrexone with Dr. De Nurse . Over the next 180 days patient will focus on increasing exercise with goal of 150 minutes per week . Over the next 180 days patient will focus on limiting carbohydrates in her diet (30-45 grams per meal)   Initial goal documentation     . Vaccines: Patient to get COVID and Shingrix vaccines       CARE PLAN ENTRY (see longitudinal plan of care for additional care plan information)  Current Barriers:  . Need for vaccination for the following: . Shingles (Shingrix  vaccine) . COVID (COVID Vaccine)  Pharmacist Clinical Goal(s):  Marland Kitchen Over the next 180 days, patient will work with PharmD and providers to coordinate care for patient to receive the recommended vaccines  Interventions: . Comprehensive medication review completed; medication list updated in electronic medical record.  Bertram Savin care team collaboration (see longitudinal plan of care) . Recommend patient receive Shingrix vaccine at pharmacy . Recommend patient receive COVID vaccine where available  Patient Self Care Activities:  . Over the next 180 days patient will complete COVID vaccine series . Over the next 180 days patient will complete Shingrix vaccine series   Initial goal documentation       Social Hx: Moved from [place] to [place] due to cost of living. Divorced. One son.   Uses  pill box  Pre-Diabetes   Recent Relevant Labs: Lab Results  Component Value Date/Time   HGBA1C 5.7 (H) 11/20/2011 11:59 AM    Patient is currently controlled on the following medications: None  We discussed: diet and exercise extensively. Recommended 150 mins of exercise and moderating carbohydrates   Plan -Consider completing A1c at next visit -Continue control with diet and exercise   Hypertension   CMP Latest Ref Rng & Units 02/03/2020 07/22/2019 11/25/2018  Glucose 70 - 99 mg/dL 97 79 82  BUN 6 - 23 mg/dL 7 10 15   Creatinine 0.40 - 1.20 mg/dL 0.78 0.75 0.79  Sodium 135 - 145 mEq/L 138 141 139  Potassium 3.5 - 5.1 mEq/L 4.4 4.3 4.6  Chloride 96 - 112 mEq/L 103 104 102  CO2 19 - 32 mEq/L 28 28 29   Calcium 8.4 - 10.5 mg/dL 9.7 9.8 9.8  Total Protein 6.0 - 8.3 g/dL 6.8 6.6 6.6  Total Bilirubin 0.2 - 1.2 mg/dL 0.6 0.5 0.4  Alkaline Phos 39 - 117 U/L 157(H) 116 98  AST 0 - 37 U/L 18 17 19   ALT 0 - 35 U/L 14 17 14   GFR       92.29   96.75   91.34  BP today is:  Unable to assess due to phone visit  Office blood pressures are  BP Readings from Last 3 Encounters:  01/19/20 120/70  11/27/19 128/80  07/22/19 138/73    Patient has failed these meds in the past: hctz (no longer needed, was taken in combo with lisinopril) Patient is currently controlled on the following medications: lisinopril 10mg  daily  Patient checks BP at home infrequently  Patient home BP readings are ranging: 120s/80s "125/85"  Denies dizziness or lightheadedness or chest pain  Plan -Continue current medications   Hyperlipidemia   Lipid Panel     Component Value Date/Time   CHOL 254 (H) 02/03/2020 1015   TRIG 72.0 02/03/2020 1015   HDL 156.50 02/03/2020 1015   CHOLHDL 2 02/03/2020 1015   VLDL 14.4 02/03/2020 1015   LDLCALC 83 02/03/2020 1015    LDL goal <100  The ASCVD Risk score ([place] et al., 2013) failed to calculate for the following reasons:   The valid HDL cholesterol range  is 20 to 100 mg/dL   Patient has failed these meds in past: none Patient is currently controlled, except for TC on the following medications: None  Exercise: Minimal. She does not like leaving her house (felt this way before COVID).  Diet: Changes frequently. Eats what she has a taste for.   Noted pt's elevated HDL which could be a result of alcohol use.   We discussed:  diet and exercise  extensively and also the need to choose USP verified supplements (regarding fish oil or flaxseed oil if she chooses to take them)  Plan -Continue control with diet and exercise   Asthma / Tobacco   Last spirometry score: None noted   Eosinophil count:   Lab Results  Component Value Date/Time   EOSPCT 7.8 (H) 09/22/2015 10:21 AM  %                               Eos (Absolute):  Lab Results  Component Value Date/Time   EOSABS 0.3 09/22/2015 10:21 AM    Tobacco Status:  Social History   Tobacco Use  Smoking Status Former Smoker  . Packs/Ellysia Char: 0.50  . Years: 10.00  . Pack years: 5.00  . Types: Cigarettes  . Quit date: 11/07/1991  . Years since quitting: 28.3  Smokeless Tobacco Never Used    Patient has failed these meds in past: None noted  Patient is currently controlled on the following medications: albuterol PRN Using maintenance inhaler regularly? No (none prescribed) Frequency of rescue inhaler use:  several times per month   Uses albuterol seasonally when weather changes Feels she has outgrown asthma. Has not had an asthma attack in a long time.  Plan -Continue current medications  Depression    Patient has failed these meds in past: None noted  Patient is currently controlled on the following medications: paroxetine 30mg  daily, lithium carbonate 300mg  BID, bupropion 100mg  2 tabs BID  Followed by psych (Dr. De Nurse)  "I'm not suicidal" Feels like she can cope.  Not isolated to the point of crying  Plan -Continue current medications   Alcohol Use Disorder     Patient has failed these meds in past: acamprosate (feels like it didn't work) Patient is currently uncontrolled on the following medications: None  Admits she still struggles with drinking.  States she is currently sober, but feel it's "creeping up on her"  We discussed:  Medication options for alcohol use disorder including naltrexone. Naltrexone does not interact with any of her current medications and she is willing to try this and discuss it further with Dr. De Nurse.   Plan -Discuss medication options for alcohol use disorder, specifically naltrexone with Dr. De Nurse    Vitamin D Deficiency   03/22/16: Vitamin D - 32.21  Patient has failed these meds in past: None noted  Patient is currently controlled on the following medications: cholecalciferol 1000 units three times weekly  Plan -Consider completing vitamin D lab at next visit -Continue current medications   Stomach Pain    Patient has failed these meds in past: None noted  Patient is currently controlled on the following medications: Hyoscyamine 0.375mg  daily   Followed by Gertie Fey (Dr. Shary Key)  Severe stomach pain, unsure of dx, "feels like gallbladder attack, but I already had my gallbladder removed", no attacks since taking hyoscyamine Used to have attacks 3 times per week Now has attacks once every 4 months   Plan -Continue current medications   Vaccines   Reviewed and discussed patient's vaccination history.    Immunization History  Administered Date(s) Administered  . Influenza,inj,Quad PF,6+ Mos 08/12/2014, 09/22/2015, 09/13/2016, 08/07/2017, 10/29/2018, 07/09/2019  . Pneumococcal Polysaccharide-23 08/29/2011  . Tdap 11/13/2014    Plan -Recommended patient receive Shingrix vaccine in pharmacy. -Recommended patient receive COVID vaccine where available.    Miscellaneous Meds  Multivitamin B12 528mcg 2-3 times weekly (2575mcg that she hasn't started yet, last  B12 on 02/03/20 = 809)  Meds to  D/C from list  Flaxseed oil (hasn't taken in a while, doesn't plan to take)

## 2020-02-26 NOTE — Patient Instructions (Addendum)
Visit Information  Goals Addressed            This Visit's Progress   . Alcohol Use: Patient will decrease/stop alcohol use       CARE PLAN ENTRY (see longitudinal plan of care for additional care plan information)  Current Barriers:  . Alcohol use complicated by stress  Pharmacist Clinical Goal(s):  Marland Kitchen Over the next 180 days, patient will work with PharmD and providers to assist patient with reducing or eliminating alcohol use  Interventions: . Comprehensive medication review completed; medication list updated in electronic medical record.  Julie Powell care team collaboration (see longitudinal plan of care) . Recommended patient to discuss option of Naltrexone with Julie Powell  Patient Self Care Activities:  . Over next 180 days patient will discuss Naltrexone with Julie Powell   Initial goal documentation     . Consider completing a1c lab at next visit on 04/21/20      . Consider completing vitamin D lab at next visit on 04/21/20      . Hyperlipidemia: LDL goal <100, Total Cholesterol goal <200       CARE PLAN ENTRY (see longitudinal plan of care for additional care plan information)  Current Barriers:  . Uncontrolled hyperlipidemia, complicated by alcohol use disorder . Current antihyperlipidemic regimen: none . Previous antihyperlipidemic medications tried None noted  . Most recent lipid panel:     Component Value Date/Time   CHOL 254 (H) 02/03/2020 1015   TRIG 72.0 02/03/2020 1015   HDL 156.50 02/03/2020 1015   CHOLHDL 2 02/03/2020 1015   VLDL 14.4 02/03/2020 1015   LDLCALC 83 02/03/2020 1015   . 10-year ASCVD risk score: The ASCVD Risk score Julie Powell., et al., 2013) failed to calculate for the following reasons: .   The valid HDL cholesterol range is 20 to 100 mg/dL .   Pharmacist Clinical Goal(s):  Marland Kitchen Over the next 180 days, patient will work with PharmD and providers towards optimized antihyperlipidemic therapy  Interventions: . Comprehensive  medication review performed; medication list updated in electronic medical record.  Julie Powell care team collaboration (see longitudinal plan of care) . Medication recommendation to assist with alcohol use disorder . Discussed diet (limiting carbohydrates) and exercise (150 minutes per week)  Patient Self Care Activities:  . Over the next 180 days patient will focus on discussing medication options with Julie Powell for alcohol use disorder . Over the next 180 days patient will focus on increasing exercise with goal of 150 minutes per week . Over the next 180 days patient will focus on limiting carbohydrates in her diet (30-45 grams per meal)  Initial goal documentation     . Pharmacy Care Plan       CARE PLAN ENTRY  Current Barriers:  . Chronic Disease Management support, education, and care coordination needs related to Pre-DM, HTN, HLD, Asthma, Depression, Alcohol Use Disorder, Vitamin D Deficiency, Stomach Pain  Pharmacist Clinical Goal(s):  Marland Kitchen Assist patient to reduce or eliminate alcohol use . Over next 60 days consider completion of vitamin D lab at next visit . Over next 60 days consider completion of a1c lab at next visit . Over next 180 days patient will maintain A1c <6.5% . Over next 180 days patient will maintain BP <140/90 . Over next 180 days patient will reach goal of LDL <100, TC <200  Interventions: . Comprehensive medication review performed. . Recommend pt to discuss naltrexone for alcohol use disorder with Julie Powell . Recommend pt to  complete COVID vaccine series . Recommend pt to complete Shingrix vaccine series . Discussed diet and exercise thoroughly with respect to pre-diabetes and hyperlipidemia  Patient Self Care Activities:  . Patient verbalizes understanding of plan to follow as described above, Self administers medications as prescribed, Calls pharmacy for medication refills, and Calls provider office for new concerns or questions over the next  180 days . Over next 180 days complete COVID vaccine series . Over next 180 days complete Shingrix vaccine series . Over next 180 days patient will discuss Naltrexone with Julie Powell . Over the next 180 days patient will focus on increasing exercise with goal of 150 minutes per week . Over the next 180 days patient will focus on limiting carbohydrates in her diet (30-45 grams per meal)   Initial goal documentation     . Vaccines: Patient to get COVID and Shingrix vaccines       CARE PLAN ENTRY (see longitudinal plan of care for additional care plan information)  Current Barriers:  . Need for vaccination for the following: . Shingles (Shingrix vaccine) . COVID (COVID Vaccine)  Pharmacist Clinical Goal(s):  Marland Kitchen Over the next 180 days, patient will work with PharmD and providers to coordinate care for patient to receive the recommended vaccines  Interventions: . Comprehensive medication review completed; medication list updated in electronic medical record.  Julie Powell care team collaboration (see longitudinal plan of care) . Recommend patient receive Shingrix vaccine at pharmacy . Recommend patient receive COVID vaccine where available  Patient Self Care Activities:  . Over the next 180 days patient will complete COVID vaccine series . Over the next 180 days patient will complete Shingrix vaccine series   Initial goal documentation        Julie Powell was given information about Chronic Care Management services today including:  1. CCM service includes personalized support from designated clinical staff supervised by her physician, including individualized plan of care and coordination with other care providers 2. 24/7 contact phone numbers for assistance for urgent and routine care needs. 3. Standard insurance, coinsurance, copays and deductibles apply for chronic care management only during months in which we provide at least 20 minutes of these services. Most insurances  cover these services at 100%, however patients may be responsible for any copay, coinsurance and/or deductible if applicable. This service may help you avoid the need for more expensive face-to-face services. 4. Only one practitioner may furnish and bill the service in a calendar month. 5. The patient may stop CCM services at any time (effective at the end of the month) by phone call to the office staff.  Patient agreed to services and verbal consent obtained.   The patient verbalized understanding of instructions provided today and agreed to receive a mailed copy of patient instruction and/or educational materials. Telephone follow up appointment with pharmacy team member scheduled for: 08/26/2020  Melvenia Beam Sharena Dibenedetto, PharmD Clinical Pharmacist [facility] [phone]   Alcohol Use Disorder Alcohol use disorder is when your drinking disrupts your daily life. When you have this condition, you drink too much alcohol and you cannot control your drinking. Alcohol use disorder can cause serious problems with your physical health. It can affect your brain, heart, liver, pancreas, immune system, stomach, and intestines. Alcohol use disorder can increase your risk for certain cancers and cause problems with your mental health, such as depression, anxiety, psychosis, delirium, and dementia. People with this disorder risk hurting themselves and others. What are the  causes? This condition is caused by drinking too much alcohol over time. It is not caused by drinking too much alcohol only one or two times. Some people with this condition drink alcohol to cope with or escape from negative life events. Others drink to relieve pain or symptoms of mental illness. What increases the risk? You are more likely to develop this condition if: You have a family history of alcohol use disorder. Your culture encourages drinking to the point of intoxication, or makes alcohol easy to  get. You had a mood or conduct disorder in childhood. You have been a victim of abuse. You are an adolescent and: You have poor grades or difficulties in school. Your caregivers do not talk to you about saying no to alcohol, or supervise your activities. You are impulsive or you have trouble with self-control. What are the signs or symptoms? Symptoms of this condition include: Drinkingmore than you want to. Drinking for longer than you want to. Trying several times to drink less or to control your drinking. Spending a lot of time getting alcohol, drinking, or recovering from drinking. Craving alcohol. Having problems at work, at school, or at home due to drinking. Having problems in relationships due to drinking. Drinking when it is dangerous to drink, such as before driving a car. Continuing to drink even though you know you might have a physical or mental problem related to drinking. Needing more and more alcohol to get the same effect you want from the alcohol (building up tolerance). Having symptoms of withdrawal when you stop drinking. Symptoms of withdrawal include: Fatigue. Nightmares. Trouble sleeping. Depression. Anxiety. Fever. Seizures. Severe confusion. Feeling or seeing things that are not there (hallucinations). Tremors. Rapid heart rate. Rapid breathing. High blood pressure. Drinking to avoid symptoms of withdrawal. How is this diagnosed? This condition is diagnosed with an assessment. Your health care provider may start the assessment by asking three or four questions about your drinking. Your health care provider may perform a physical exam or do lab tests to see if you have physical problems resulting from alcohol use. She or he may refer you to a mental health professional for evaluation. How is this treated? Some people with alcohol use disorder are able to reduce their alcohol use to low-risk levels. Others need to completely quit drinking alcohol. When  necessary, mental health professionals with specialized training in substance use treatment can help. Your health care provider can help you decide how severe your alcohol use disorder is and what type of treatment you need. The following forms of treatment are available: Detoxification. Detoxification involves quitting drinking and using prescription medicines within the first week to help lessen withdrawal symptoms. This treatment is important for people who have had withdrawal symptoms before and for heavy drinkers who are likely to have withdrawal symptoms. Alcohol withdrawal can be dangerous, and in severe cases, it can cause death. Detoxification may be provided in a home, community, or primary care setting, or in a hospital or substance use treatment facility. Counseling. This treatment is also called talk therapy. It is provided by substance use treatment counselors. A counselor can address the reasons you use alcohol and suggest ways to keep you from drinking again or to prevent problem drinking. The goals of talk therapy are to: Find healthy activities and ways for you to cope with stress. Identify and avoid the things that trigger your alcohol use. Help you learn how to handle cravings. Medicines.Medicines can help treat alcohol use disorder by: Decreasing  alcohol cravings. Decreasing the positive feeling you have when you drink alcohol. Causing an uncomfortable physical reaction when you drink alcohol (aversion therapy). Support groups. Support groups are led by people who have quit drinking. They provide emotional support, advice, and guidance. These forms of treatment are often combined. Some people with this condition benefit from a combination of treatments provided by specialized substance use treatment centers. Follow these instructions at home: Take over-the-counter and prescription medicines only as told by your health care provider. Check with your health care provider before  starting any new medicines. Ask friends and family members not to offer you alcohol. Avoid situations where alcohol is served, including gatherings where others are drinking alcohol. Create a plan for what to do when you are tempted to use alcohol. Find hobbies or activities that you enjoy that do not include alcohol. Keep all follow-up visits as told by your health care provider. This is important. How is this prevented? If you drink, limit alcohol intake to no more than 1 drink a Maxden Naji for nonpregnant women and 2 drinks a Delva Derden for men. One drink equals 12 oz of beer, 5 oz of wine, or 1 oz of hard liquor. If you have a mental health condition, get treatment and support. Do not give alcohol to adolescents. If you are an adolescent: Do not drink alcohol. Do not be afraid to say no if someone offers you alcohol. Speak up about why you do not want to drink. You can be a positive role model for your friends and set a good example for those around you by not drinking alcohol. If your friends drink, spend time with others who do not drink alcohol. Make new friends who do not use alcohol. Find healthy ways to manage stress and emotions, such as meditation or deep breathing, exercise, spending time in nature, listening to music, or talking with a trusted friend or family member. Contact a health care provider if: You are not able to take your medicines as told. Your symptoms get worse. You return to drinking alcohol (relapse) and your symptoms get worse. Get help right away if: You have thoughts about hurting yourself or others. If you ever feel like you may hurt yourself or others, or have thoughts about taking your own life, get help right away. You can go to your nearest emergency department or call: Your local emergency services (911 in the [place] A suicide crisis helpline, such as the [facility] at (604)115-7937. This is open 24 hours a Shawnda Mauney. Summary Alcohol use disorder  is when your drinking disrupts your daily life. When you have this condition, you drink too much alcohol and you cannot control your drinking. Treatment may include detoxification, counseling, medicine, and support groups. Ask friends and family members not to offer you alcohol. Avoid situations where alcohol is served. Get help right away if you have thoughts about hurting yourself or others. This information is not intended to replace advice given to you by your health care provider. Make sure you discuss any questions you have with your health care provider. Document Revised: 10/05/2017 Document Reviewed: 07/20/2016 Elsevier Patient Education  2020 [place].  Prediabetes Eating Plan Prediabetes is a condition that causes blood sugar (glucose) levels to be higher than normal. This increases the risk for developing diabetes. In order to prevent diabetes from developing, your health care provider may recommend a diet and other lifestyle changes to help you:  Control your blood glucose levels.  Improve your cholesterol  levels.  Manage your blood pressure. Your health care provider may recommend working with a diet and nutrition specialist (dietitian) to make a meal plan that is best for you. What are tips for following this plan? Lifestyle  Set weight loss goals with the help of your health care team. It is recommended that most people with prediabetes lose 7% of their current body weight.  Exercise for at least 30 minutes at least 5 days a week.  Attend a support group or seek ongoing support from a mental health counselor.  Take over-the-counter and prescription medicines only as told by your health care provider. Reading food labels  Read food labels to check the amount of fat, salt (sodium), and sugar in prepackaged foods. Avoid foods that have: ? Saturated fats. ? Trans fats. ? Added sugars.  Avoid foods that have more than 300 milligrams (mg) of sodium per serving. Limit  your daily sodium intake to less than 2,300 mg each Jamair Cato. Shopping  Avoid buying pre-made and processed foods. Cooking  Cook with olive oil. Do not use butter, lard, or ghee.  Bake, broil, grill, or boil foods. Avoid frying. Meal planning   Work with your dietitian to develop an eating plan that is right for you. This may include: ? Tracking how many calories you take in. Use a food diary, notebook, or mobile application to track what you eat at each meal. ? Using the glycemic index (GI) to plan your meals. The index tells you how quickly a food will raise your blood glucose. Choose low-GI foods. These foods take a longer time to raise blood glucose.  Consider following a Mediterranean diet. This diet includes: ? Several servings each Linkin Vizzini of fresh fruits and vegetables. ? Eating fish at least twice a week. ? Several servings each Fannye Myer of whole grains, beans, nuts, and seeds. ? Using olive oil instead of other fats. ? Moderate alcohol consumption. ? Eating small amounts of red meat and whole-fat dairy.  If you have high blood pressure, you may need to limit your sodium intake or follow a diet such as the DASH eating plan. DASH is an eating plan that aims to lower high blood pressure. What foods are recommended? The items listed below may not be a complete list. Talk with your dietitian about what dietary choices are best for you. Grains Whole grains, such as whole-wheat or whole-grain breads, crackers, cereals, and pasta. Unsweetened oatmeal. Bulgur. Barley. Quinoa. Brown rice. Corn or whole-wheat flour tortillas or taco shells. Vegetables Lettuce. Spinach. Peas. Beets. Cauliflower. Cabbage. Broccoli. Carrots. Tomatoes. Squash. Eggplant. Herbs. Peppers. Onions. Cucumbers. Brussels sprouts. Fruits Berries. Bananas. Apples. Oranges. Grapes. Papaya. Mango. Pomegranate. Kiwi. Grapefruit. Cherries. Meats and other protein foods Seafood. Poultry without skin. Lean cuts of pork and beef.  Tofu. Eggs. Nuts. Beans. Dairy Low-fat or fat-free dairy products, such as yogurt, cottage cheese, and cheese. Beverages Water. Tea. Coffee. Sugar-free or diet soda. Seltzer water. Lowfat or no-fat milk. Milk alternatives, such as soy or almond milk. Fats and oils Olive oil. Canola oil. Sunflower oil. Grapeseed oil. Avocado. Walnuts. Sweets and desserts Sugar-free or low-fat pudding. Sugar-free or low-fat ice cream and other frozen treats. Seasoning and other foods Herbs. Sodium-free spices. Mustard. Relish. Low-fat, low-sugar ketchup. Low-fat, low-sugar barbecue sauce. Low-fat or fat-free mayonnaise. What foods are not recommended? The items listed below may not be a complete list. Talk with your dietitian about what dietary choices are best for you. Grains Refined white flour and flour products, such as  bread, pasta, snack foods, and cereals. Vegetables Canned vegetables. Frozen vegetables with butter or cream sauce. Fruits Fruits canned with syrup. Meats and other protein foods Fatty cuts of meat. Poultry with skin. Breaded or fried meat. Processed meats. Dairy Full-fat yogurt, cheese, or milk. Beverages Sweetened drinks, such as sweet iced tea and soda. Fats and oils Butter. Lard. Ghee. Sweets and desserts Baked goods, such as cake, cupcakes, pastries, cookies, and cheesecake. Seasoning and other foods Spice mixes with added salt. Ketchup. Barbecue sauce. Mayonnaise. Summary  To prevent diabetes from developing, you may need to make diet and other lifestyle changes to help control blood sugar, improve cholesterol levels, and manage your blood pressure.  Set weight loss goals with the help of your health care team. It is recommended that most people with prediabetes lose 7 percent of their current body weight.  Consider following a Mediterranean diet that includes plenty of fresh fruits and vegetables, whole grains, beans, nuts, seeds, fish, lean meat, low-fat dairy, and  healthy oils. This information is not intended to replace advice given to you by your health care provider. Make sure you discuss any questions you have with your health care provider. Document Revised: 02/14/2019 Document Reviewed: 12/27/2016 Elsevier Patient Education  2020 [employer].

## 2020-03-10 ENCOUNTER — Other Ambulatory Visit (HOSPITAL_COMMUNITY): Payer: Self-pay

## 2020-03-10 ENCOUNTER — Other Ambulatory Visit: Payer: Self-pay

## 2020-03-10 DIAGNOSIS — F332 Major depressive disorder, recurrent severe without psychotic features: Secondary | ICD-10-CM

## 2020-03-10 DIAGNOSIS — I10 Essential (primary) hypertension: Secondary | ICD-10-CM

## 2020-03-10 MED ORDER — PAROXETINE HCL 30 MG PO TABS: 30.0000 mg | ORAL_TABLET | Freq: Every morning | ORAL | 0 refills | Status: DC

## 2020-03-10 MED ORDER — LITHIUM CARBONATE ER 300 MG PO TBCR: 300.0000 mg | EXTENDED_RELEASE_TABLET | Freq: Two times a day (BID) | ORAL | 0 refills | Status: DC

## 2020-03-10 MED ORDER — BUPROPION HCL 100 MG PO TABS: 200.0000 mg | ORAL_TABLET | Freq: Two times a day (BID) | ORAL | 0 refills | Status: DC

## 2020-03-10 MED ORDER — LISINOPRIL 10 MG PO TABS: ORAL_TABLET | ORAL | 0 refills | Status: DC

## 2020-04-21 ENCOUNTER — Encounter: Payer: Self-pay | Admitting: Family

## 2020-04-21 ENCOUNTER — Ambulatory Visit (INDEPENDENT_AMBULATORY_CARE_PROVIDER_SITE_OTHER): Payer: Medicare Other | Admitting: Family

## 2020-04-21 ENCOUNTER — Other Ambulatory Visit: Payer: Self-pay

## 2020-04-21 VITALS — BP 113/64 | HR 84 | Temp 98.2°F | Resp 18 | Ht 64.0 in | Wt 278.0 lb

## 2020-04-21 DIAGNOSIS — I10 Essential (primary) hypertension: Secondary | ICD-10-CM

## 2020-04-21 DIAGNOSIS — F32A Depression, unspecified: Secondary | ICD-10-CM

## 2020-04-21 DIAGNOSIS — F1010 Alcohol abuse, uncomplicated: Secondary | ICD-10-CM

## 2020-04-21 DIAGNOSIS — F329 Major depressive disorder, single episode, unspecified: Secondary | ICD-10-CM

## 2020-04-21 DIAGNOSIS — E010 Iodine-deficiency related diffuse (endemic) goiter: Secondary | ICD-10-CM

## 2020-04-21 NOTE — Progress Notes (Signed)
Subjective:    Patient ID: Julie Powell, female    DOB: 06/20/1961, 59 y.o.   MRN: [id number]  HPI   Patient is a 59 yr old female who presents today for follow up.  HTN- maintained on lisinopril BP Readings from Last 3 Encounters:  04/21/20 113/64  01/19/20 120/70  11/27/19 128/80   Depression- reports no changes.  Mood is ok some days, most days just feels kind of tired, doesn't want to go out.    Alcohol abuse-reports that she "goes back and forth."  somtimes she stops. Beer 12 pack a day.  Stopped wine.  Rum and diet coke.    Hyperlipidemia-  Lab Results  Component Value Date   CHOL 254 (H) 02/03/2020   HDL 156.50 02/03/2020   LDLCALC 83 02/03/2020   TRIG 72.0 02/03/2020   CHOLHDL 2 02/03/2020     Review of Systems See HPI  Past Medical History:  Diagnosis Date  . Anxiety   . Asthma   . B12 deficiency   . Bipolar 1 disorder ([place]) 2010   ect  . Fibroids   . Hypertension   . Plantar fasciitis 2013  . Sciatica    RT hip  . Vitamin D deficiency      Social History   Socioeconomic History  . Marital status: Divorced    Spouse name: Not on file  . Number of children: 1  . Years of education: Not on file  . Highest education level: Bachelor's degree (e.g., BA, AB, BS)  Occupational History  . Not on file  Tobacco Use  . Smoking status: Former Smoker    Packs/day: 0.50    Years: 10.00    Pack years: 5.00    Types: Cigarettes    Quit date: 11/07/1991    Years since quitting: 28.4  . Smokeless tobacco: Never Used  Vaping Use  . Vaping Use: Never used  Substance and Sexual Activity  . Alcohol use: Yes    Alcohol/week: 0.0 standard drinks    Comment: stopped wine. Now beer. 12 per day.   . Drug use: Not Currently    Types: "Crack" cocaine    Comment: Caffeine- Carbonated beverage 16 ounces.  . Sexual activity: Never    Birth control/protection: Post-menopausal  Other Topics Concern  . 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   Enjoys television   Right handed    Apartment 1st floor    Social Determinants of Health   Financial Resource Strain: Low Risk   . Difficulty of Paying Living Expenses: Not hard at all  Food Insecurity: No Food Insecurity  . Worried About Charity fundraiser in the Last Year: Never true  . Ran Out of Food in the Last Year: Never true  Transportation Needs: No Transportation Needs  . Lack of Transportation (Medical): No  . Lack of Transportation (Non-Medical): No  Physical Activity:   . Days of Exercise per Week:   . Minutes of Exercise per Session:   Stress:   . Feeling of Stress :   Social Connections:   . Frequency of Communication with Friends and Family:   . Frequency of Social Gatherings with Friends and Family:   .  Attends Religious Services:   . Active Member of Clubs or Organizations:   . Attends Archivist Meetings:   Marland Kitchen Marital Status:   Intimate Partner Violence:   . Fear of Current or Ex-Partner:   . Emotionally Abused:   Marland Kitchen Physically Abused:   . Sexually Abused:     Past Surgical History:  Procedure Laterality Date  . CESAREAN SECTION  1995  . CYSTECTOMY  1998   "thyroglycol duct cyst in neck" had dysphagia, was told benign  . GASTRIC BYPASS  2003  . MICRODISCECTOMY LUMBAR  09/11/14   L3-L4  Dr Saintclair Halsted  . PILONIDAL CYST DRAINAGE  1982  . REDUCTION MAMMAPLASTY    . SP CHOLECYSTOMY  1986    Family History  Adopted: Yes  Problem Relation Age of Onset  . Aneurysm Father   . Alcohol abuse Father   . Alcohol abuse Brother   . Alcohol abuse Brother   . Cancer Brother        prostate  . Alcohol abuse Brother   . Lung cancer Sister   . Cancer Sister        lung  . Thyroid disease Neg Hx   . Diabetes Neg Hx    . Hypertension Neg Hx     No Known Allergies  Current Outpatient Medications on File Prior to Visit  Medication Sig Dispense Refill  . albuterol (PROAIR HFA) 108 (90 Base) MCG/ACT inhaler Inhale 2 puffs into the lungs every 6 (six) hours as needed for wheezing or shortness of breath. 20.1 g 1  . buPROPion (WELLBUTRIN) 100 MG tablet Take 2 tablets (200 mg total) by mouth 2 (two) times daily. 360 tablet 0  . cholecalciferol (VITAMIN D) 1000 UNITS tablet Take 1,000 Units by mouth 3 (three) times a week.     . Flaxseed, Linseed, (FLAX SEED OIL PO) Take by mouth 3 (three) times a week.     . hyoscyamine (LEVBID) 0.375 MG 12 hr tablet Take 0.375 mg by mouth daily.  0  . lisinopril (ZESTRIL) 10 MG tablet TAKE 1 TABLET(10 MG) BY MOUTH DAILY 90 tablet 0  . lithium carbonate (LITHOBID) 300 MG CR tablet Take 1 tablet (300 mg total) by mouth 2 (two) times daily. 180 tablet 0  . Multiple Vitamin (MULTIVITAMIN) tablet Take 1 tablet by mouth 3 (three) times a week.     Marland Kitchen PARoxetine (PAXIL) 30 MG tablet Take 1 tablet (30 mg total) by mouth every morning. 90 tablet 0  . vitamin B-12 (CYANOCOBALAMIN) 500 MCG tablet Take 500 mcg by mouth. 2-3 times per week    . Zoster Vaccine Adjuvanted [facility]) injection Inject 0.5mg  IM now and again in 2-6 months. 0.5 mL 1   No current facility-administered medications on file prior to visit.    BP 113/64 (BP Location: Right Arm, Patient Position: Sitting, Cuff Size: Large)   Pulse 84   Temp 98.2 F (36.8 C) (Temporal)   Resp 18   Ht 5\' 4"  (1.626 m)   Wt 278 lb (126.1 kg)   LMP 09/11/2013   SpO2 100%   BMI 47.72 kg/m       Objective:   Physical Exam Constitutional:      Appearance: She is well-developed.  Neck:     Thyroid: Thyromegaly (L>R) present.  Cardiovascular:     Rate and Rhythm: Normal rate and regular rhythm.     Heart sounds: Normal heart sounds. No murmur heard.   Pulmonary:     Effort: Pulmonary  effort is normal. No respiratory  distress.     Breath sounds: Normal breath sounds. No wheezing.  Musculoskeletal:     Cervical back: Neck supple.  Skin:    General: Skin is warm and dry.  Neurological:     Mental Status: She is alert and oriented to person, place, and time.  Psychiatric:        Behavior: Behavior normal.        Thought Content: Thought content normal.        Judgment: Judgment normal.           Assessment & Plan:  HTN- bp stable on current dose of lisinopril. Continue same.  Depression- remains uncontrolled.  Defer management to psych.  Alcoholism- uncontrolled. We discussed her considering AA virtual meetings. She will consider.  Thyromegaly- repeat thyroid ultrasound.   This visit occurred during the SARS-CoV-2 public health emergency.  Safety protocols were in place, including screening questions prior to the visit, additional usage of staff PPE, and extensive cleaning of exam room while observing appropriate contact time as indicated for disinfecting solutions.

## 2020-04-21 NOTE — Patient Instructions (Addendum)
Below are two ways to schedule a Covid-19 Vaccine:  Please visit [email or web address] to register or call [phone]  Or call:  [facility] Covid-19 vaccine scheduling at [phone]  Please consider enrolling in a virtual [place] meeting.

## 2020-04-28 ENCOUNTER — Other Ambulatory Visit: Payer: Self-pay

## 2020-04-28 ENCOUNTER — Ambulatory Visit (HOSPITAL_BASED_OUTPATIENT_CLINIC_OR_DEPARTMENT_OTHER)
Admission: RE | Admit: 2020-04-28 | Discharge: 2020-04-28 | Disposition: A | Discharge status: Home / Self Care | Payer: Medicare Other | Source: Ambulatory Visit | Attending: Family | Admitting: Family

## 2020-04-28 DIAGNOSIS — E010 Iodine-deficiency related diffuse (endemic) goiter: Secondary | ICD-10-CM | POA: Insufficient documentation

## 2020-05-28 ENCOUNTER — Encounter (HOSPITAL_COMMUNITY): Payer: Self-pay | Admitting: Psychiatry

## 2020-05-28 ENCOUNTER — Telehealth (INDEPENDENT_AMBULATORY_CARE_PROVIDER_SITE_OTHER): Payer: Medicare Other | Admitting: Psychiatry

## 2020-05-28 DIAGNOSIS — F3130 Bipolar disorder, current episode depressed, mild or moderate severity, unspecified: Secondary | ICD-10-CM

## 2020-05-28 DIAGNOSIS — F332 Major depressive disorder, recurrent severe without psychotic features: Secondary | ICD-10-CM

## 2020-05-28 DIAGNOSIS — F1020 Alcohol dependence, uncomplicated: Secondary | ICD-10-CM

## 2020-05-28 NOTE — Progress Notes (Signed)
Patient ID: Julie Powell, female   DOB: Mar 15, 1961, 59 y.o.   MRN: [id number] [place] Follow-up Outpatient Visit  Julie Powell 06-02-1961  Date: 05/28/2020    I connected with [name] on 05/28/20 at 11:30 AM EDT by telephone and verified that I am speaking with the correct person using two identifiers.   I discussed the limitations, risks, security and privacy concerns of performing an evaluation and management service by telephone and the availability of in person appointments. I also discussed with the patient that there may be a patient responsible charge related to this service. The patient expressed understanding and agreed to proceed.  Patient location home Provider location home  CC  depression follow up  HPI Comments: Ms. Ruggirello is a 59   Y/O woman with a past psychiatric history Major Depressive Disorder, severe, recurrent. Mood disorder due to [facility], Alcohol use disorder and Social Phobia.    Still struggles with alcohol but have cut down beer amount during the day.  Understands all help and referral available and have been discussed   Planning to join audio AA if possibile, but has not yet  Depression; fair Aggravating factor: lonliness Modifying factor: housing,   . Duration: since young age . Timing: more so in evening  . Context: alcohol use history\ . Modifying factors: meds, son . Associated signs and symptoms: Denies psychosis   Traumatic Brain Injury: No none   Review of Systems  Cardiovascular: Negative for chest pain.  Skin: Negative for itching.  Neurological: Negative for headaches.  Psychiatric/Behavioral: Negative for substance abuse and suicidal ideas.   There were no vitals filed for this visit. Physical Exam  Constitutional: She appears well-developed and well-nourished. No distress.  Skin: She is not diaphoretic.  No tremors of hands noted while patient was interviewed.     Past Medical History: Reviewed  Past  Medical History:  Diagnosis Date  . Anxiety   . Asthma   . B12 deficiency   . Bipolar 1 disorder ([place]) 2010   ect  . Fibroids   . Hypertension   . Plantar fasciitis 2013  . Sciatica    RT hip  . Vitamin D deficiency     Allergies: No Known Allergies   Current Medications: Reviewed   Current Outpatient Medications on File Prior to Visit  Medication Sig Dispense Refill  . albuterol (PROAIR HFA) 108 (90 Base) MCG/ACT inhaler Inhale 2 puffs into the lungs every 6 (six) hours as needed for wheezing or shortness of breath. 20.1 g 1  . buPROPion (WELLBUTRIN) 100 MG tablet Take 2 tablets (200 mg total) by mouth 2 (two) times daily. 360 tablet 0  . cholecalciferol (VITAMIN D) 1000 UNITS tablet Take 1,000 Units by mouth 3 (three) times a week.     . Flaxseed, Linseed, (FLAX SEED OIL PO) Take by mouth 3 (three) times a week.     . hyoscyamine (LEVBID) 0.375 MG 12 hr tablet Take 0.375 mg by mouth daily.  0  . lisinopril (ZESTRIL) 10 MG tablet TAKE 1 TABLET(10 MG) BY MOUTH DAILY 90 tablet 0  . lithium carbonate (LITHOBID) 300 MG CR tablet Take 1 tablet (300 mg total) by mouth 2 (two) times daily. 180 tablet 0  . Multiple Vitamin (MULTIVITAMIN) tablet Take 1 tablet by mouth 3 (three) times a week.     Marland Kitchen PARoxetine (PAXIL) 30 MG tablet Take 1 tablet (30 mg total) by mouth every morning. 90 tablet 0  . vitamin B-12 (CYANOCOBALAMIN) 500 MCG  tablet Take 500 mcg by mouth. 2-3 times per week    . Zoster Vaccine Adjuvanted [facility]) injection Inject 0.5mg  IM now and again in 2-6 months. 0.5 mL 1   No current facility-administered medications on file prior to visit.      Family History: Reviewed  Family History   Problem  Relation  Age of Onset   .  Adopted: Yes   .  Aneurysm  Father     Psychiatric Specialty Exam:  Objective: Appearance:   Eye Contact::   Speech: Clear and Coherent and Normal Rate   Volume: Normal   Mood  fair  Affect: congruent  Thought Process: Goal Directed,  Logical and Loose   Orientation: Full   Thought Content: WDL   Suicidal Thoughts: Patient denies   Homicidal Thoughts: Patient denies   Judgement: Fair   Insight: Fair   Psychomotor Activity: Normal   Akathisia: No   Handed: Right   Memory: Immediate 3/3, Recent: 1/3   [place] of knowledge-average to above average.  AIMS (if indicated): Not indicated at this time.   Assets: Communication Skills  Desire for Improvement    Assessment:  AXIS I: Major Depressive Disorder, ; GAD: insomnia: Alcohol use disorder Treatment Plan/Recommendations:   Plan of Care:  Provided supportive therapy. Reviewed side effects . Sent prescriptions.       .   Medications:  Continue medications with no change::   Bipolar depression: fair, continue lithium wellbutrin Labs not done, have requested levels last visit, says she forgot, she will call lab  Anxiety: fluctuates, continue paxil Depression: fair. Continue welllbutrin.  Alcohol useL discussed to join audio AA , relapse prevention    Fu 25m. .    call for refills I discussed the assessment and treatment plan with the patient. The patient was provided an opportunity to ask questions and all were answered. The patient agreed with the plan and demonstrated an understanding of the instructions.   The patient was advised to call back or seek an in-person evaluation if the symptoms worsen or if the condition fails to improve as anticipated.  I provided 15 minutes of non-face-to-face time during this encounter.       Merian Capron, M.D.  05/28/2020 11:39 AM

## 2020-06-04 ENCOUNTER — Telehealth (HOSPITAL_COMMUNITY): Payer: Medicare Other | Admitting: Psychiatry

## 2020-06-08 ENCOUNTER — Other Ambulatory Visit (HOSPITAL_COMMUNITY): Payer: Self-pay | Admitting: Psychiatry

## 2020-06-08 ENCOUNTER — Other Ambulatory Visit: Payer: Self-pay | Admitting: Family

## 2020-06-08 DIAGNOSIS — F332 Major depressive disorder, recurrent severe without psychotic features: Secondary | ICD-10-CM

## 2020-06-08 DIAGNOSIS — I10 Essential (primary) hypertension: Secondary | ICD-10-CM

## 2020-07-06 DIAGNOSIS — Z23 Encounter for immunization: Secondary | ICD-10-CM | POA: Diagnosis not present

## 2020-07-21 ENCOUNTER — Telehealth: Payer: Self-pay | Admitting: Pharmacist

## 2020-07-21 NOTE — Progress Notes (Signed)
Chronic Care Management Pharmacy Assistant   Name: Julie Powell  MRN: [id number] DOB: May 19, 1961  Reason for Encounter: Disease State  Patient Questions:  1.  Have you seen any other providers since your last visit? No  2.  Any changes in your medicines or health? No  PCP : [name], NP   Their chronic conditions include: Pre-DM, HTN, HLD, Asthma, Depression, Alcohol Use Disorder, Vitamin D Deficiency, Stomach Pain  Office visit: 04-21-2020 (PCP) Patient present in the office with [name] for a follow up. US thyroid was ordered.  Provider recommended patient for covid vaccine and to attend AA meetings.   No Consults or Hospitalizations since last visit with CCM on 02-26-2020.  Allergies:  No Known Allergies  Medications: Outpatient Encounter Medications as of 07/21/2020  Medication Sig Note   albuterol (PROAIR HFA) 108 (90 Base) MCG/ACT inhaler Inhale 2 puffs into the lungs every 6 (six) hours as needed for wheezing or shortness of breath.    buPROPion (WELLBUTRIN) 100 MG tablet TAKE 2 TABLETS(200 MG) BY MOUTH TWICE DAILY    cholecalciferol (VITAMIN D) 1000 UNITS tablet Take 1,000 Units by mouth 3 (three) times a week.     Flaxseed, Linseed, (FLAX SEED OIL PO) Take by mouth 3 (three) times a week.     hyoscyamine (LEVBID) 0.375 MG 12 hr tablet Take 0.375 mg by mouth daily. 12/21/2015: Received from: External Pharmacy   lisinopril (ZESTRIL) 10 MG tablet TAKE 1 TABLET(10 MG) BY MOUTH DAILY    lithium carbonate (LITHOBID) 300 MG CR tablet TAKE 1 TABLET(300 MG) BY MOUTH TWICE DAILY    Multiple Vitamin (MULTIVITAMIN) tablet Take 1 tablet by mouth 3 (three) times a week.     PARoxetine (PAXIL) 30 MG tablet TAKE 1 TABLET(30 MG) BY MOUTH EVERY MORNING    vitamin B-12 (CYANOCOBALAMIN) 500 MCG tablet Take 500 mcg by mouth. 2-3 times per week    Zoster Vaccine Adjuvanted [facility]) injection Inject 0.5mg  IM now and again in 2-6 months.    No  facility-administered encounter medications on file as of 07/21/2020.    Current Diagnosis: Patient Active Problem List   Diagnosis Date Noted   Depression 04/07/2019   Plantar fasciitis 04/07/2019   Alcohol abuse 12/12/2016   Hyperlipidemia 05/13/2015   Bariatric surgery status 12/31/2014   Vitamin B 12 deficiency 12/31/2014   Preventative health care 11/13/2014   Multinodular goiter 08/27/2014   Vitamin D deficiency 08/13/2014   Asthma 08/13/2014   Sciatica 08/13/2014   Major depressive disorder, recurrent, severe w/o psychotic behavior ([place]) 06/11/2013   Leiomyoma uteri 06/20/2011   Hypertension 05/23/2011   Obesity (BMI 30-39.9) 05/23/2011    Goals Addressed   None    Reviewed chart prior to disease state call. Spoke with patient regarding BP  Recent Office Vitals: BP Readings from Last 3 Encounters:  04/21/20 113/64  01/19/20 120/70  11/27/19 128/80   Pulse Readings from Last 3 Encounters:  04/21/20 84  07/22/19 78  07/09/19 80    Wt Readings from Last 3 Encounters:  04/21/20 278 lb (126.1 kg)  01/19/20 279 lb (126.6 kg)  07/22/19 275 lb (124.7 kg)     Kidney Function Lab Results  Component Value Date/Time   CREATININE 0.78 02/03/2020 10:15 AM   CREATININE 0.75 07/22/2019 10:56 AM   CREATININE 0.76 02/01/2017 11:21 AM   CREATININE 1.06 06/11/2013 01:06 PM   GFR 92.29 02/03/2020 10:15 AM   GFRNONAA 65 (L) 02/21/2014 08:01 PM   GFRNONAA 67 11/20/2011  11:59 AM   GFRAA 75 (L) 02/21/2014 08:01 PM   GFRAA 77 11/20/2011 11:59 AM    BMP Latest Ref Rng & Units 02/03/2020 07/22/2019 11/25/2018  Glucose 70 - 99 mg/dL 97 79 82  BUN 6 - 23 mg/dL 7 10 15   Creatinine 0.40 - 1.20 mg/dL 0.78 0.75 0.79  Sodium 135 - 145 mEq/L 138 141 139  Potassium 3.5 - 5.1 mEq/L 4.4 4.3 4.6  Chloride 96 - 112 mEq/L 103 104 102  CO2 19 - 32 mEq/L 28 28 29   Calcium 8.4 - 10.5 mg/dL 9.7 9.8 9.8     Chronic Care Management   Outreach Note  07/29/2020 Name:  Julie Powell MRN: [id number] DOB: [birth year]  Referred by: [name], NP Reason for referral : Chronic Care Management   Third unsuccessful telephone outreach was attempted today. The patient was referred to the pharmacist for assistance with care management and care coordination.     Follow-Up:  Pharmacist Review   [place] Shannon, [facility] [place] Pharmacist Assistant [phone]

## 2020-07-27 DIAGNOSIS — Z23 Encounter for immunization: Secondary | ICD-10-CM | POA: Diagnosis not present

## 2020-08-05 ENCOUNTER — Telehealth: Payer: Self-pay | Admitting: Pharmacist

## 2020-08-05 NOTE — Progress Notes (Addendum)
Verified Adherence Gap Information. Per insurance data, the patient is up to date with her preventative care including breast cancer screening. AWV performed. Medication star metrics unavailable.   Fanny Skates, [place] Pharmacist Assistant [phone]  Reviewed by: De Blanch, PharmD Clinical Pharmacist [facility] [phone]

## 2020-08-10 ENCOUNTER — Other Ambulatory Visit (HOSPITAL_BASED_OUTPATIENT_CLINIC_OR_DEPARTMENT_OTHER): Payer: Self-pay | Admitting: Family

## 2020-08-10 DIAGNOSIS — Z1231 Encounter for screening mammogram for malignant neoplasm of breast: Secondary | ICD-10-CM

## 2020-08-25 ENCOUNTER — Ambulatory Visit (INDEPENDENT_AMBULATORY_CARE_PROVIDER_SITE_OTHER): Payer: Medicare Other | Admitting: Family

## 2020-08-25 ENCOUNTER — Telehealth (HOSPITAL_COMMUNITY): Payer: Medicare Other | Admitting: Psychiatry

## 2020-08-25 ENCOUNTER — Other Ambulatory Visit: Payer: Self-pay

## 2020-08-25 VITALS — BP 148/65 | HR 78 | Temp 98.3°F | Resp 16 | Ht 64.0 in | Wt 276.0 lb

## 2020-08-25 DIAGNOSIS — E538 Deficiency of other specified B group vitamins: Secondary | ICD-10-CM

## 2020-08-25 DIAGNOSIS — F332 Major depressive disorder, recurrent severe without psychotic features: Secondary | ICD-10-CM | POA: Diagnosis not present

## 2020-08-25 DIAGNOSIS — R531 Weakness: Secondary | ICD-10-CM

## 2020-08-25 DIAGNOSIS — F1010 Alcohol abuse, uncomplicated: Secondary | ICD-10-CM | POA: Diagnosis not present

## 2020-08-25 DIAGNOSIS — I10 Essential (primary) hypertension: Secondary | ICD-10-CM

## 2020-08-25 NOTE — Progress Notes (Signed)
Subjective:    Patient ID: Julie Powell, female    DOB: 12/31/1960, 59 y.o.   MRN: [id number]  HPI  Patient is a 59 yr old female who presents today for follow up.   HTN- did not take her AM lisinopril.   BP Readings from Last 3 Encounters:  08/25/20 (!) 148/65  04/21/20 113/64  01/19/20 120/70   Depression-reports that her depression is "about the same."  She continues to follow with psychiatry and is maintained on wellbutrin and lithium.  ETOH withdrawal- states that she drinks about 2 fifths of alcohol a week. Drank 1/2 of the fifth yesterday.   Sometimes she will switch to wine or beer. If she drinks beer she will drink "the whole 12 pack" in a day.  Wants to stop drinking.  Last drink was yesterday. Feeling weak and shaky today because she has not had any alcohol yet today.    Review of Systems See HPI  Past Medical History:  Diagnosis Date  . Anxiety   . Asthma   . B12 deficiency   . Bipolar 1 disorder ([place]) 2010   ect  . Fibroids   . Hypertension   . Plantar fasciitis 2013  . Sciatica    RT hip  . Vitamin D deficiency      Social History   Socioeconomic History  . Marital status: Divorced    Spouse name: Not on file  . Number of children: 1  . Years of education: Not on file  . Highest education level: Bachelor's degree (e.g., BA, AB, BS)  Occupational History  . Not on file  Tobacco Use  . Smoking status: Former Smoker    Packs/day: 0.50    Years: 10.00    Pack years: 5.00    Types: Cigarettes    Quit date: 11/07/1991    Years since quitting: 28.8  . Smokeless tobacco: Never Used  Vaping Use  . Vaping Use: Never used  Substance and Sexual Activity  . Alcohol use: Yes    Alcohol/week: 0.0 standard drinks    Comment: stopped wine. Now beer. 12 per day.   . Drug use: Not Currently    Types: "Crack" cocaine    Comment: Caffeine- Carbonated beverage 16 ounces.  . Sexual activity: Never    Birth control/protection: Post-menopausal  Other Topics  Concern  . 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   Enjoys television   Right handed    Apartment 1st floor    Social Determinants of Health   Financial Resource Strain: Low Risk   . Difficulty of Paying Living Expenses: Not hard at all  Food Insecurity: No Food Insecurity  . Worried About Charity fundraiser in the Last Year: Never true  . Ran Out of Food in the Last Year: Never true  Transportation Needs: No Transportation Needs  . Lack of Transportation (Medical): No  . Lack of Transportation (Non-Medical): No  Physical Activity:   . Days of Exercise per Week: Not on file  . Minutes of Exercise per Session: Not on file  Stress:   . Feeling of Stress : Not on file  Social Connections:   . Frequency of Communication with Friends and  Family: Not on file  . Frequency of Social Gatherings with Friends and Family: Not on file  . Attends Religious Services: Not on file  . Active Member of Clubs or Organizations: Not on file  . Attends Archivist Meetings: Not on file  . Marital Status: Not on file  Intimate Partner Violence:   . Fear of Current or Ex-Partner: Not on file  . Emotionally Abused: Not on file  . Physically Abused: Not on file  . Sexually Abused: Not on file    Past Surgical History:  Procedure Laterality Date  . CESAREAN SECTION  1995  . CYSTECTOMY  1998   "thyroglycol duct cyst in neck" had dysphagia, was told benign  . GASTRIC BYPASS  2003  . MICRODISCECTOMY LUMBAR  09/11/14   L3-L4  Dr Saintclair Halsted  . PILONIDAL CYST DRAINAGE  1982  . REDUCTION MAMMAPLASTY    . SP CHOLECYSTOMY  1986    Family History  Adopted: Yes  Problem Relation Age of Onset  . Aneurysm Father   . Alcohol abuse Father   .  Alcohol abuse Brother   . Alcohol abuse Brother   . Cancer Brother        prostate  . Alcohol abuse Brother   . Lung cancer Sister   . Cancer Sister        lung  . Thyroid disease Neg Hx   . Diabetes Neg Hx   . Hypertension Neg Hx     No Known Allergies  Current Outpatient Medications on File Prior to Visit  Medication Sig Dispense Refill  . albuterol (PROAIR HFA) 108 (90 Base) MCG/ACT inhaler Inhale 2 puffs into the lungs every 6 (six) hours as needed for wheezing or shortness of breath. 20.1 g 1  . buPROPion (WELLBUTRIN) 100 MG tablet TAKE 2 TABLETS(200 MG) BY MOUTH TWICE DAILY 360 tablet 0  . cholecalciferol (VITAMIN D) 1000 UNITS tablet Take 1,000 Units by mouth 3 (three) times a week.     . hyoscyamine (LEVBID) 0.375 MG 12 hr tablet Take 0.375 mg by mouth daily.  0  . lisinopril (ZESTRIL) 10 MG tablet TAKE 1 TABLET(10 MG) BY MOUTH DAILY 90 tablet 0  . lithium carbonate (LITHOBID) 300 MG CR tablet TAKE 1 TABLET(300 MG) BY MOUTH TWICE DAILY 180 tablet 0  . Multiple Vitamin (MULTIVITAMIN) tablet Take 1 tablet by mouth 3 (three) times a week.     Marland Kitchen PARoxetine (PAXIL) 30 MG tablet TAKE 1 TABLET(30 MG) BY MOUTH EVERY MORNING 90 tablet 0  . vitamin B-12 (CYANOCOBALAMIN) 500 MCG tablet Take 500 mcg by mouth. 2-3 times per week    . Zoster Vaccine Adjuvanted [facility]) injection Inject 0.5mg  IM now and again in 2-6 months. 0.5 mL 1   No current facility-administered medications on file prior to visit.    BP (!) 148/65 (BP Location: Right Arm, Patient Position: Sitting, Cuff Size: Large)   Pulse 78   Temp 98.3 F (36.8 C) (Oral)   Resp 16   Ht 5\' 4"  (1.626 m)   Wt 276 lb (125.2 kg)   LMP 09/11/2013   SpO2 100%   BMI 47.38 kg/m       Objective:   Physical Exam Constitutional:      Appearance: She is well-developed.  Cardiovascular:     Rate and Rhythm: Normal rate and regular rhythm.     Heart sounds: Normal heart sounds. No murmur heard.   Pulmonary:     Effort:  Pulmonary effort is  normal. No respiratory distress.     Breath sounds: Normal breath sounds. No wheezing.  Neurological:     Comments: ETOH withdrawal tremor noted  Psychiatric:        Behavior: Behavior normal.        Thought Content: Thought content normal.        Judgment: Judgment normal.           Assessment & Plan:  ETOH abuse- pt is interested in becoming sober.  I cautioned her against cold [place] discontinuation of alcohol on her own.  Suggested she try cutting back and calling [facility] to inquire about their alcohol detox/rehabilitation program so she can come off of alcohol under medical supervision. I gave her the contact number for [facility].  Depression- remains unchanged and uncontrolled- advised follow up with psychiatry.  HTN- bp above goal.  She did not take her AM medication. Reinforced compliance.  b12 deficiency- check b12 and folate level.   This visit occurred during the SARS-CoV-2 public health emergency.  Safety protocols were in place, including screening questions prior to the visit, additional usage of staff PPE, and extensive cleaning of exam room while observing appropriate contact time as indicated for disinfecting solutions.

## 2020-08-25 NOTE — Patient Instructions (Addendum)
Please call [phone] to inquire about the alcohol treatment program.  Complete lab work prior to leaving.

## 2020-08-26 ENCOUNTER — Ambulatory Visit: Payer: Medicare Other | Admitting: Pharmacist

## 2020-08-26 DIAGNOSIS — F1010 Alcohol abuse, uncomplicated: Secondary | ICD-10-CM

## 2020-08-26 DIAGNOSIS — I10 Essential (primary) hypertension: Secondary | ICD-10-CM

## 2020-08-26 LAB — COMPREHENSIVE METABOLIC PANEL
AG Ratio: 1.4 (calc) (ref 1.0–2.5)
ALT: 93 U/L — ABNORMAL HIGH (ref 6–29)
AST: 115 U/L — ABNORMAL HIGH (ref 10–35)
Albumin: 4 g/dL (ref 3.6–5.1)
Alkaline phosphatase (APISO): 174 U/L — ABNORMAL HIGH (ref 37–153)
BUN: 10 mg/dL (ref 7–25)
CO2: 26 mmol/L (ref 20–32)
Calcium: 9.7 mg/dL (ref 8.6–10.4)
Chloride: 100 mmol/L (ref 98–110)
Creat: 0.88 mg/dL (ref 0.50–1.05)
Globulin: 2.8 g/dL (calc) (ref 1.9–3.7)
Glucose, Bld: 91 mg/dL (ref 65–99)
Potassium: 4.6 mmol/L (ref 3.5–5.3)
Sodium: 135 mmol/L (ref 135–146)
Total Bilirubin: 0.6 mg/dL (ref 0.2–1.2)
Total Protein: 6.8 g/dL (ref 6.1–8.1)

## 2020-08-26 LAB — CBC WITH DIFFERENTIAL/PLATELET
Absolute Monocytes: 320 cells/uL (ref 200–950)
Basophils Absolute: 52 cells/uL (ref 0–200)
Basophils Relative: 1.1 %
Eosinophils Absolute: 150 cells/uL (ref 15–500)
Eosinophils Relative: 3.2 %
HCT: 41.9 % (ref 35.0–45.0)
Hemoglobin: 13.9 g/dL (ref 11.7–15.5)
Lymphs Abs: 1086 cells/uL (ref 850–3900)
MCH: 33.7 pg — ABNORMAL HIGH (ref 27.0–33.0)
MCHC: 33.2 g/dL (ref 32.0–36.0)
MCV: 101.7 fL — ABNORMAL HIGH (ref 80.0–100.0)
MPV: 11.1 fL (ref 7.5–12.5)
Monocytes Relative: 6.8 %
Neutro Abs: 3093 cells/uL (ref 1500–7800)
Neutrophils Relative %: 65.8 %
Platelets: 229 10*3/uL (ref 140–400)
RBC: 4.12 10*6/uL (ref 3.80–5.10)
RDW: 13.5 % (ref 11.0–15.0)
Total Lymphocyte: 23.1 %
WBC: 4.7 10*3/uL (ref 3.8–10.8)

## 2020-08-26 LAB — B12 AND FOLATE PANEL
Folate: 7 ng/mL
Vitamin B-12: 502 pg/mL (ref 200–1100)

## 2020-08-26 LAB — TSH: TSH: 4.1 mIU/L (ref 0.40–4.50)

## 2020-08-26 NOTE — Chronic Care Management (AMB) (Signed)
Chronic Care Management Pharmacy  Name: Julie Powell  MRN: [id number] DOB: 1960/11/12  Chief Complaint/ HPI  Julie Powell,  59 y.o. , female presents for their Follow-Up CCM visit with the clinical pharmacist via telephone due to COVID-19 Pandemic.  PCP : [name], NP  Their chronic conditions include: Pre-DM, HTN, HLD, Asthma, Depression, Alcohol Use Disorder, Vitamin D Deficiency, Stomach Pain  Office Visits: 08/25/20: Visit w/ [name], NP - No med changes noted. Discussion about alcoholism and referral to [facility] to assist with alcohol detox.   04/21/20: Visit w/ [name], NP - Alcoholism uncontrolled. Discussed AA virtual meetings. Pt to consider. No med changes noted.   Consult Visit: 05/28/20: Psych visit w/ Dr. Awilda Bill - Continue lithium and wellbutrin. Join AA.   Medications: Outpatient Encounter Medications as of 08/26/2020  Medication Sig Note  . albuterol (PROAIR HFA) 108 (90 Base) MCG/ACT inhaler Inhale 2 puffs into the lungs every 6 (six) hours as needed for wheezing or shortness of breath.   Marland Kitchen buPROPion (WELLBUTRIN) 100 MG tablet TAKE 2 TABLETS(200 MG) BY MOUTH TWICE DAILY   . cholecalciferol (VITAMIN D) 1000 UNITS tablet Take 1,000 Units by mouth 3 (three) times a week.    . hyoscyamine (LEVBID) 0.375 MG 12 hr tablet Take 0.375 mg by mouth daily. 12/21/2015: Received from: External Pharmacy  . lisinopril (ZESTRIL) 10 MG tablet TAKE 1 TABLET(10 MG) BY MOUTH DAILY   . lithium carbonate (LITHOBID) 300 MG CR tablet TAKE 1 TABLET(300 MG) BY MOUTH TWICE DAILY   . Multiple Vitamin (MULTIVITAMIN) tablet Take 1 tablet by mouth 3 (three) times a week.    Marland Kitchen PARoxetine (PAXIL) 30 MG tablet TAKE 1 TABLET(30 MG) BY MOUTH EVERY MORNING   . vitamin B-12 (CYANOCOBALAMIN) 500 MCG tablet Take 500 mcg by mouth. 2-3 times per week   . Zoster Vaccine Adjuvanted [facility]) injection Inject 0.5mg  IM now and again in 2-6 months.    No  facility-administered encounter medications on file as of 08/26/2020.    Current Diagnosis/Assessment:  Goals Addressed            This Visit's Progress   . Chronic Care Management Pharmacy Care Plan       CARE PLAN ENTRY (see longitudinal plan of care for additional care plan information)  Current Barriers:  . Chronic Disease Management support, education, and care coordination needs related to Pre-DM, HTN, HLD, Asthma, Depression, Alcohol Use Disorder, Vitamin D Deficiency, Stomach Pain   Hypertension BP Readings from Last 3 Encounters:  08/25/20 (!) 148/65  04/21/20 113/64  01/19/20 120/70   . Pharmacist Clinical Goal(s): o Over the next 90 days, patient will work with PharmD and providers to maintain BP goal <140/90 . Current regimen:  o Lisinopril 10mg  daily . Interventions: o Discussed BP goal  . Patient self care activities - Over the next 90 days, patient will: o Maintain hypertension medication regimen. o   Hyperlipidemia Lab Results  Component Value Date/Time   LDLCALC 83 02/03/2020 10:15 AM   . Pharmacist Clinical Goal(s): o Over the next 90 days, patient will work with PharmD and providers to maintain LDL goal < 100 . Current regimen:  o Diet and exercise management   . Interventions: o Discussed LDL goal . Patient self care activities - Over the next 90 days, patient will: o Maintain LDL <100  Pre-Diabetes Lab Results  Component Value Date/Time   HGBA1C 5.7 (H) 11/20/2011 11:59 AM   . Pharmacist Clinical Goal(s): o Over the  next 90 days, patient will work with PharmD and providers to maintain A1c goal <6.5% . Current regimen:  o Diet and exercise management  . Interventions: o Discussed A1c goal . Patient self care activities - Over the next 90 days, patient will: o Maintain a1c <6.5%  Alcohol Use Disorder . Pharmacist Clinical Goal(s) o Over the next 90 days, patient will work with PharmD and providers to reduce alcohol  consumptoms . Current regimen:  o None . Interventions: o Encouraged/Reinforced patient to contact [facility] for more details on detox program . Patient self care activities - Over the next 90 days, patient will: o Call [facility] to inquire of detox program details  Medication management . Pharmacist Clinical Goal(s): o Over the next 90 days, patient will work with PharmD and providers to maintain optimal medication adherence . Current pharmacy: [facility] . Interventions o Comprehensive medication review performed. o Continue current medication management strategy . Patient self care activities - Over the next 90 days, patient will: o Focus on medication adherence by filling and taking medications appropriately  o Take medications as prescribed o Report any questions or concerns to PharmD and/or provider(s)  Please see past updates related to this goal by clicking on the "Past Updates" button in the selected goal        Social Hx: Moved from [place] to [place] due to cost of living. Divorced. One son.   Uses pill box  Pre-Diabetes   Recent Relevant Labs: Lab Results  Component Value Date/Time   HGBA1C 5.7 (H) 11/20/2011 11:59 AM    Patient is currently controlled on the following medications: None  We discussed: diet and exercise extensively. Recommended 150 mins of exercise and moderating carbohydrates   Update 08/26/20 Patient would benefit from updated a1c at visit in January.  Plan -Consider completing A1c at next visit in January -Continue control with diet and exercise   Hypertension   BP goal <140/90  CMP Latest Ref Rng & Units 08/25/2020 02/03/2020 07/22/2019  Glucose 65 - 99 mg/dL 91 97 79  BUN 7 - 25 mg/dL 10 7 10   Creatinine 0.50 - 1.05 mg/dL 0.88 0.78 0.75  Sodium 135 - 146 mmol/L 135 138 141  Potassium 3.5 - 5.3 mmol/L 4.6 4.4 4.3  Chloride 98 - 110 mmol/L 100 103 104  CO2 20 - 32 mmol/L 26 28 28   Calcium 8.6 - 10.4 mg/dL 9.7 9.7 9.8   Total Protein 6.1 - 8.1 g/dL 6.8 6.8 6.6  Total Bilirubin 0.2 - 1.2 mg/dL 0.6 0.6 0.5  Alkaline Phos 39 - 117 U/L - 157(H) 116  AST 10 - 35 U/L 115(H) 18 17  ALT 6 - 29 U/L 93(H) 14 17   Kidney Function Lab Results  Component Value Date/Time   CREATININE 0.88 08/25/2020 12:06 PM   CREATININE 0.78 02/03/2020 10:15 AM   CREATININE 0.75 07/22/2019 10:56 AM   CREATININE 0.76 02/01/2017 11:21 AM   GFR 92.29 02/03/2020 10:15 AM   GFRNONAA 65 (L) 02/21/2014 08:01 PM   GFRNONAA 67 11/20/2011 11:59 AM   GFRAA 75 (L) 02/21/2014 08:01 PM   GFRAA 77 11/20/2011 11:59 AM   K 4.6 08/25/2020 12:06 PM   K 4.4 02/03/2020 10:15 AM   Office blood pressures are  BP Readings from Last 3 Encounters:  08/25/20 (!) 148/65  04/21/20 113/64  01/19/20 120/70    Patient has failed these meds in the past: hctz (no longer needed, was taken in combo with lisinopril) Patient is currently  controlled on the following medications:   Lisinopril 10mg  daily  Patient checks BP at home infrequently  Patient home BP readings are ranging: 120s/80s "125/85"  Update 08/26/20 Denies dizziness or lightheadedness or chest pain  Plan -Continue current medications    Alcohol Use Disorder    Patient has failed these meds in past: acamprosate (feels like it didn't work) Patient is currently uncontrolled on the following medications: None  Admits she still struggles with drinking.  States she is currently sober, but feel it's "creeping up on her"  We discussed:  Medication options for alcohol use disorder including naltrexone. Naltrexone does not interact with any of her current medications and she is willing to try this and discuss it further with Dr. De Nurse.   Update 08/26/20 Baseline is 13 beers/Symphanie Cederberg Was given acamprosate from Dr. De Nurse. Has not used it. Has no identifiable reason why. She feels she is doing better.  Feels she is at a 9 on a 10 point scale of reducing her alcohol consumption. (1 do not  want to quit, 10 wish I quit yesterday) Feels she lives in an alternate universe due to all of the alcohol consumption. Yesterday only had 2 beers.  She states she was previously declined from a detox program, so she is hesitant about trying again. Active steps: Won't buy 12 pack anymore. Drinks enough not to have nervousness or jumpiness. When she goes to the store she buys 2 beers.   Reinforced calling [facility] for detox. She is hesitant about calling.  Encouraged her to call to get more information.  States she will make plan to call today or tomorrow.  Plan -Call [facility] to receive details of intake process for detox program -Discuss medication options for alcohol use disorder, specifically naltrexone with Dr. De Nurse   Vaccines   Reviewed and discussed patient's vaccination history.    Immunization History  Administered Date(s) Administered  . Influenza,inj,Quad PF,6+ Mos 08/12/2014, 09/22/2015, 09/13/2016, 08/07/2017, 10/29/2018, 07/09/2019  . PFIZER SARS-COV-2 Vaccination 07/06/2020, 07/27/2020  . Pneumococcal Polysaccharide-23 08/29/2011  . Tdap 11/13/2014    Plan -Recommended patient receive Shingrix vaccine in pharmacy. -Recommended patient receive COVID vaccine where available.    Miscellaneous Meds  Multivitamin B12 565mcg 2-3 times weekly (2569mcg that she hasn't started yet, last B12 on 02/03/20 = 809)  Meds to D/C from list  Flaxseed oil (hasn't taken in a while, doesn't plan to take)  De Blanch, PharmD Clinical Pharmacist [facility] [phone]

## 2020-08-26 NOTE — Patient Instructions (Signed)
Visit Information  Goals Addressed            This Visit's Progress   . Chronic Care Management Pharmacy Care Plan       CARE PLAN ENTRY (see longitudinal plan of care for additional care plan information)  Current Barriers:  . Chronic Disease Management support, education, and care coordination needs related to Pre-DM, HTN, HLD, Asthma, Depression, Alcohol Use Disorder, Vitamin D Deficiency, Stomach Pain   Hypertension BP Readings from Last 3 Encounters:  08/25/20 (!) 148/65  04/21/20 113/64  01/19/20 120/70   . Pharmacist Clinical Goal(s): o Over the next 90 days, patient will work with PharmD and providers to maintain BP goal <140/90 . Current regimen:  o Lisinopril 10mg  daily . Interventions: o Discussed BP goal  . Patient self care activities - Over the next 90 days, patient will: o Maintain hypertension medication regimen. o   Hyperlipidemia Lab Results  Component Value Date/Time   LDLCALC 83 02/03/2020 10:15 AM   . Pharmacist Clinical Goal(s): o Over the next 90 days, patient will work with PharmD and providers to maintain LDL goal < 100 . Current regimen:  o Diet and exercise management   . Interventions: o Discussed LDL goal . Patient self care activities - Over the next 90 days, patient will: o Maintain LDL <100  Pre-Diabetes Lab Results  Component Value Date/Time   HGBA1C 5.7 (H) 11/20/2011 11:59 AM   . Pharmacist Clinical Goal(s): o Over the next 90 days, patient will work with PharmD and providers to maintain A1c goal <6.5% . Current regimen:  o Diet and exercise management  . Interventions: o Discussed A1c goal . Patient self care activities - Over the next 90 days, patient will: o Maintain a1c <6.5%  Alcohol Use Disorder . Pharmacist Clinical Goal(s) o Over the next 90 days, patient will work with PharmD and providers to reduce alcohol consumptoms . Current regimen:  o None . Interventions: o Encouraged/Reinforced patient to contact  [facility] for more details on detox program . Patient self care activities - Over the next 90 days, patient will: o Call [facility] to inquire of detox program details  Medication management . Pharmacist Clinical Goal(s): o Over the next 90 days, patient will work with PharmD and providers to maintain optimal medication adherence . Current pharmacy: [facility] . Interventions o Comprehensive medication review performed. o Continue current medication management strategy . Patient self care activities - Over the next 90 days, patient will: o Focus on medication adherence by filling and taking medications appropriately  o Take medications as prescribed o Report any questions or concerns to PharmD and/or provider(s)  Please see past updates related to this goal by clicking on the "Past Updates" button in the selected goal         The patient verbalized understanding of instructions provided today and agreed to receive a mailed copy of patient instruction and/or educational materials.  Telephone follow up appointment with pharmacy team member scheduled for: 12/01/2020  Julie Powell, PharmD Clinical Pharmacist [facility] [phone]

## 2020-08-31 ENCOUNTER — Other Ambulatory Visit: Payer: Self-pay

## 2020-08-31 ENCOUNTER — Ambulatory Visit (HOSPITAL_BASED_OUTPATIENT_CLINIC_OR_DEPARTMENT_OTHER)
Admission: RE | Admit: 2020-08-31 | Discharge: 2020-08-31 | Disposition: A | Discharge status: Home / Self Care | Payer: Medicare Other | Source: Ambulatory Visit | Attending: Family | Admitting: Family

## 2020-08-31 ENCOUNTER — Encounter (HOSPITAL_BASED_OUTPATIENT_CLINIC_OR_DEPARTMENT_OTHER): Payer: Self-pay

## 2020-08-31 DIAGNOSIS — Z1231 Encounter for screening mammogram for malignant neoplasm of breast: Secondary | ICD-10-CM | POA: Diagnosis not present

## 2020-09-03 ENCOUNTER — Telehealth: Payer: Self-pay | Admitting: Pharmacist

## 2020-09-03 NOTE — Progress Notes (Addendum)
Verified Adherence GAP Information.  Per insurance data, patient has met compliance with non-tobacco user, Breast Cancer Screening Performed,PHQ-9 and Annual Wellness Visit.  HTA data through 07/06/20.  [place] Shannon, [facility] Pharmacist Assistant [phone]  Reviewed by: De Blanch, PharmD Clinical Pharmacist [facility] [phone]

## 2020-09-06 ENCOUNTER — Other Ambulatory Visit: Payer: Self-pay | Admitting: Family

## 2020-09-06 ENCOUNTER — Other Ambulatory Visit (HOSPITAL_COMMUNITY): Payer: Self-pay | Admitting: Psychiatry

## 2020-09-06 DIAGNOSIS — I10 Essential (primary) hypertension: Secondary | ICD-10-CM

## 2020-09-06 DIAGNOSIS — F332 Major depressive disorder, recurrent severe without psychotic features: Secondary | ICD-10-CM

## 2020-09-07 ENCOUNTER — Encounter (HOSPITAL_COMMUNITY): Payer: Self-pay | Admitting: Psychiatry

## 2020-09-07 ENCOUNTER — Telehealth (INDEPENDENT_AMBULATORY_CARE_PROVIDER_SITE_OTHER): Payer: Medicare Other | Admitting: Psychiatry

## 2020-09-07 DIAGNOSIS — F4010 Social phobia, unspecified: Secondary | ICD-10-CM | POA: Diagnosis not present

## 2020-09-07 DIAGNOSIS — F3130 Bipolar disorder, current episode depressed, mild or moderate severity, unspecified: Secondary | ICD-10-CM | POA: Diagnosis not present

## 2020-09-07 DIAGNOSIS — F332 Major depressive disorder, recurrent severe without psychotic features: Secondary | ICD-10-CM

## 2020-09-07 DIAGNOSIS — F1020 Alcohol dependence, uncomplicated: Secondary | ICD-10-CM | POA: Diagnosis not present

## 2020-09-07 MED ORDER — LITHIUM CARBONATE ER 300 MG PO TBCR
EXTENDED_RELEASE_TABLET | ORAL | 0 refills | Status: DC
Start: 2020-09-07 — End: 2020-12-06

## 2020-09-07 MED ORDER — BUPROPION HCL 100 MG PO TABS
ORAL_TABLET | ORAL | 0 refills | Status: DC
Start: 2020-09-07 — End: 2020-12-06

## 2020-09-07 MED ORDER — PAROXETINE HCL 30 MG PO TABS: ORAL_TABLET | ORAL | 0 refills | Status: DC

## 2020-09-07 NOTE — Progress Notes (Signed)
Patient ID: Julie Powell, female   DOB: 05-Jun-1961, 59 y.o.   MRN: [id number] [place] Follow-up Outpatient Visit  Julie Powell 1961/09/07  Date:09/07/2020    I connected with Julie Powell on 09/07/20 at  1:15 PM EDT by telephone and verified that I am speaking with the correct person using two identifiers. I discussed the limitations, risks, security and privacy concerns of performing an evaluation and management service by telephone and the availability of in person appointments. I also discussed with the patient that there may be a patient responsible charge related to this service. The patient expressed understanding and agreed to proceed.  Patient location home Provider location home office  CC  depression follow up  HPI Comments: Julie Powell is a 59   Y/O woman with a past psychiatric history Major Depressive Disorder, severe, recurrent. Mood disorder due to [facility], Alcohol use disorder and Social Phobia.    Struggles with alcohol use but has not reached out for programs. Discussed programs and referral resources today as well  Mood wise fair but stays at home, gets lonely and beer use is the same amount   Aggravating factor: lonliness Modifying factor: housing, son  . Duration: since young age . Timing: more so in evening  . Context: alcohol use history\  . Associated signs and symptoms: Denies psychosis   Traumatic Brain Injury: No none   Review of Systems  Cardiovascular: Negative for chest pain.  Skin: Negative for itching.  Neurological: Negative for headaches.  Psychiatric/Behavioral: Positive for substance abuse. Negative for suicidal ideas.   There were no vitals filed for this visit. Physical Exam  Constitutional: She appears well-developed and well-nourished. No distress.  Skin: She is not diaphoretic.  No tremors of hands noted while patient was interviewed.     Past Medical History: Reviewed  Past Medical History:  Diagnosis Date  .  Anxiety   . Asthma   . B12 deficiency   . Bipolar 1 disorder ([place]) 2010   ect  . Fibroids   . Hypertension   . Plantar fasciitis 2013  . Sciatica    RT hip  . Vitamin D deficiency     Allergies: No Known Allergies   Current Medications: Reviewed   Current Outpatient Medications on File Prior to Visit  Medication Sig Dispense Refill  . albuterol (PROAIR HFA) 108 (90 Base) MCG/ACT inhaler Inhale 2 puffs into the lungs every 6 (six) hours as needed for wheezing or shortness of breath. 20.1 g 1  . cholecalciferol (VITAMIN D) 1000 UNITS tablet Take 1,000 Units by mouth 3 (three) times a week.     . hyoscyamine (LEVBID) 0.375 MG 12 hr tablet Take 0.375 mg by mouth daily.  0  . lisinopril (ZESTRIL) 10 MG tablet TAKE 1 TABLET(10 MG) BY MOUTH DAILY 90 tablet 0  . Multiple Vitamin (MULTIVITAMIN) tablet Take 1 tablet by mouth 3 (three) times a week.     . vitamin B-12 (CYANOCOBALAMIN) 500 MCG tablet Take 500 mcg by mouth. 2-3 times per week    . Zoster Vaccine Adjuvanted [facility]) injection Inject 0.5mg  IM now and again in 2-6 months. 0.5 mL 1   No current facility-administered medications on file prior to visit.      Family History: Reviewed  Family History   Problem  Relation  Age of Onset   .  Adopted: Yes   .  Aneurysm  Father     Psychiatric Specialty Exam:  Objective: Appearance:   Eye Contact::  Speech: Clear and Coherent and Normal Rate   Volume: Normal   Mood  fair  Affect: congruent  Thought Process: Goal Directed, Logical and Loose   Orientation: Full   Thought Content: WDL   Suicidal Thoughts: Patient denies   Homicidal Thoughts: Patient denies   Judgement: Fair   Insight: Fair   Psychomotor Activity: Normal   Akathisia: No   Handed: Right   Memory: Immediate 3/3, Recent: 1/3   [place] of knowledge-average to above average.  AIMS (if indicated): Not indicated at this time.   Assets: Communication Skills  Desire for Improvement     Assessment:  AXIS I: Major Depressive Disorder, ; GAD: insomnia: Alcohol use disorder Treatment Plan/Recommendations:   Plan of Care:  Provided supportive therapy. Reviewed side effects . Sent prescriptions.       .   Medications:  Continue medications with no change::   Bipolar depression: mood fair continue lithium, sent lab request again She forgot to get it    Anxiety: fluctuates, continue paxil Depression: subdued at times, discussed AA and detox programs , encouraged to attend and get help,  Alcohol useL discussed to join audio AA , see above  Fu 70m. .    refills sent I discussed the assessment and treatment plan with the patient. The patient was provided an opportunity to ask questions and all were answered. The patient agreed with the plan and demonstrated an understanding of the instructions.   The patient was advised to call back or seek an in-person evaluation if the symptoms worsen or if the condition fails to improve as anticipated.  I provided 15 minutes of non-face-to-face time during this encounter.       Merian Capron, M.D.  09/07/2020 1:31 PM

## 2020-09-10 DIAGNOSIS — F3130 Bipolar disorder, current episode depressed, mild or moderate severity, unspecified: Secondary | ICD-10-CM | POA: Diagnosis not present

## 2020-09-11 LAB — LITHIUM LEVEL: Lithium Lvl: 0.7 mmol/L (ref 0.6–1.2)

## 2020-11-17 ENCOUNTER — Other Ambulatory Visit: Payer: Self-pay

## 2020-11-17 ENCOUNTER — Encounter (HOSPITAL_BASED_OUTPATIENT_CLINIC_OR_DEPARTMENT_OTHER): Payer: Self-pay

## 2020-11-17 ENCOUNTER — Emergency Department (HOSPITAL_BASED_OUTPATIENT_CLINIC_OR_DEPARTMENT_OTHER)
Admission: EM | Admit: 2020-11-17 | Discharge: 2020-11-17 | Disposition: A | Discharge status: Home / Self Care | Payer: Medicare Other | Attending: Emergency Medicine | Admitting: Emergency Medicine

## 2020-11-17 DIAGNOSIS — I951 Orthostatic hypotension: Secondary | ICD-10-CM | POA: Diagnosis not present

## 2020-11-17 DIAGNOSIS — M6281 Muscle weakness (generalized): Secondary | ICD-10-CM | POA: Diagnosis present

## 2020-11-17 DIAGNOSIS — Z87891 Personal history of nicotine dependence: Secondary | ICD-10-CM | POA: Diagnosis not present

## 2020-11-17 DIAGNOSIS — Z79899 Other long term (current) drug therapy: Secondary | ICD-10-CM | POA: Diagnosis not present

## 2020-11-17 DIAGNOSIS — R531 Weakness: Secondary | ICD-10-CM

## 2020-11-17 DIAGNOSIS — R802 Orthostatic proteinuria, unspecified: Secondary | ICD-10-CM | POA: Diagnosis not present

## 2020-11-17 DIAGNOSIS — I10 Essential (primary) hypertension: Secondary | ICD-10-CM | POA: Diagnosis not present

## 2020-11-17 DIAGNOSIS — J45909 Unspecified asthma, uncomplicated: Secondary | ICD-10-CM | POA: Diagnosis not present

## 2020-11-17 LAB — PREGNANCY, URINE: Preg Test, Ur: NEGATIVE

## 2020-11-17 LAB — URINALYSIS, ROUTINE W REFLEX MICROSCOPIC
Bilirubin Urine: NEGATIVE
Glucose, UA: NEGATIVE mg/dL
Hgb urine dipstick: NEGATIVE
Ketones, ur: NEGATIVE mg/dL
Leukocytes,Ua: NEGATIVE
Nitrite: NEGATIVE
Protein, ur: NEGATIVE mg/dL
Specific Gravity, Urine: 1.02 (ref 1.005–1.030)
pH: 6 (ref 5.0–8.0)

## 2020-11-17 LAB — BASIC METABOLIC PANEL
Anion gap: 10 (ref 5–15)
BUN: 9 mg/dL (ref 6–20)
CO2: 24 mmol/L (ref 22–32)
Calcium: 9.7 mg/dL (ref 8.9–10.3)
Chloride: 101 mmol/L (ref 98–111)
Creatinine, Ser: 0.94 mg/dL (ref 0.44–1.00)
GFR, Estimated: >60 mL/min (ref >60)
Glucose, Bld: 110 mg/dL — ABNORMAL HIGH (ref 70–99)
Potassium: 4.1 mmol/L (ref 3.5–5.1)
Sodium: 135 mmol/L (ref 135–145)

## 2020-11-17 LAB — CBG MONITORING, ED: Glucose-Capillary: 74 mg/dL (ref 70–99)

## 2020-11-17 LAB — CBC
HCT: 40.9 % (ref 36.0–46.0)
Hemoglobin: 13.4 g/dL (ref 12.0–15.0)
MCH: 33.7 pg (ref 26.0–34.0)
MCHC: 32.8 g/dL (ref 30.0–36.0)
MCV: 102.8 fL — ABNORMAL HIGH (ref 80.0–100.0)
Platelets: 269 10*3/uL (ref 150–400)
RBC: 3.98 MIL/uL (ref 3.87–5.11)
RDW: 12.4 % (ref 11.5–15.5)
WBC: 7.6 10*3/uL (ref 4.0–10.5)
nRBC: 0 % (ref 0.0–0.2)

## 2020-11-17 NOTE — ED Notes (Signed)
Pt performed Orthostatic VS with no acute changes noted during assessment. Pt had no complaints and also ambulated well within the examination room with no acute changes. Pt ambulated well with no gait/mobility issues noted and returned to the stretcher. Pt provided with PO fluids as requested.

## 2020-11-17 NOTE — ED Triage Notes (Signed)
Pt reports having weakness in her legs that started yesterday. Pt denies any chest pain, sob, n/v.

## 2020-11-17 NOTE — ED Provider Notes (Signed)
[facility] EMERGENCY DEPARTMENT Provider Note   CSN: [id number] Arrival date & time: 11/17/20  1705     History Chief Complaint  Patient presents with  . Weakness    Julie Powell is a 60 y.o. female.  The history is provided by the patient.  Weakness  Julie Powell is a 60 y.o. female who presents to the Emergency Department complaining of weakness. She presents the emergency department complaining of weakness in bilateral lower extremities that started yesterday. She states that she has noticed that when she is shopping she feels shaking week after she goes down a couple of aisles. She feels mild shakiness in her arms as well but does not feel weak in her arms. She feels like she might pass out. She is able to get herself to a place where she can sit down. Symptoms do get better when she sits down. She denies any headache, neck pain, back pain, chest pain, abdominal pain, nausea, vomiting, shortness of breath. No prior similar symptoms. She has a history of hypertension, anxiety. No prior similar symptoms. No significant past family medical history but she is adopted. No known sick contacts. She has been vaccinated for COVID-19. She states that she also feels slightly foggy headed like it takes a little bit longer to form her thoughts. She does drink alcohol regularly. She used to drink about a 12 pack a day but over the last several days she has been only drinking a six pack a day.    Past Medical History:  Diagnosis Date  . Anxiety   . Asthma   . B12 deficiency   . Bipolar 1 disorder ([place]) 2010   ect  . Fibroids   . Hypertension   . Plantar fasciitis 2013  . Sciatica    RT hip  . Vitamin D deficiency     Patient Active Problem List   Diagnosis Date Noted  . Depression 04/07/2019  . Plantar fasciitis 04/07/2019  . Alcohol abuse 12/12/2016  . Hyperlipidemia 05/13/2015  . Bariatric surgery status 12/31/2014  . Vitamin B 12 deficiency 12/31/2014  . Preventative  health care 11/13/2014  . Multinodular goiter 08/27/2014  . Vitamin D deficiency 08/13/2014  . Asthma 08/13/2014  . Sciatica 08/13/2014  . Major depressive disorder, recurrent, severe w/o psychotic behavior ([place]) 06/11/2013  . Leiomyoma uteri 06/20/2011  . Hypertension 05/23/2011  . Obesity (BMI 30-39.9) 05/23/2011    Past Surgical History:  Procedure Laterality Date  . CESAREAN SECTION  1995  . CYSTECTOMY  1998   "thyroglycol duct cyst in neck" had dysphagia, was told benign  . GASTRIC BYPASS  2003  . MICRODISCECTOMY LUMBAR  09/11/14   L3-L4  Dr Saintclair Halsted  . PILONIDAL CYST DRAINAGE  1982  . REDUCTION MAMMAPLASTY    . SP CHOLECYSTOMY  1986     OB History    Gravida  1   Para  1   Term  1   Preterm      AB      Living  1     SAB      IAB      Ectopic      Multiple      Live Births  1           Family History  Adopted: Yes  Problem Relation Age of Onset  . Aneurysm Father   . Alcohol abuse Father   . Alcohol abuse Brother   . Alcohol abuse Brother   . Cancer Brother  prostate  . Alcohol abuse Brother   . Lung cancer Sister   . Cancer Sister        lung  . Thyroid disease Neg Hx   . Diabetes Neg Hx   . Hypertension Neg Hx     Social History   Tobacco Use  . Smoking status: Former Smoker    Packs/day: 0.50    Years: 10.00    Pack years: 5.00    Types: Cigarettes    Quit date: 11/07/1991    Years since quitting: 29.0  . Smokeless tobacco: Never Used  Vaping Use  . Vaping Use: Never used  Substance Use Topics  . Alcohol use: Yes    Alcohol/week: 0.0 standard drinks    Comment: stopped wine. Now beer. 12 per day.   . Drug use: Not Currently    Types: "Crack" cocaine    Comment: Caffeine- Carbonated beverage 16 ounces.    Home Medications Prior to Admission medications   Medication Sig Start Date End Date Taking? Authorizing Provider  albuterol (PROAIR HFA) 108 (90 Base) MCG/ACT inhaler Inhale 2 puffs into the lungs every 6 (six)  hours as needed for wheezing or shortness of breath. 01/20/20   [name], NP  buPROPion (WELLBUTRIN) 100 MG tablet TAKE 2 TABLETS(200 MG) BY MOUTH TWICE DAILY 09/07/20   Merian Capron, MD  cholecalciferol (VITAMIN D) 1000 UNITS tablet Take 1,000 Units by mouth 3 (three) times a week.     [provider]  hyoscyamine (LEVBID) 0.375 MG 12 hr tablet Take 0.375 mg by mouth daily. 11/18/15   [provider]  lisinopril (ZESTRIL) 10 MG tablet TAKE 1 TABLET(10 MG) BY MOUTH DAILY 09/06/20   [name], NP  lithium carbonate (LITHOBID) 300 MG CR tablet TAKE 1 TABLET(300 MG) BY MOUTH TWICE DAILY 09/07/20   Merian Capron, MD  Multiple Vitamin (MULTIVITAMIN) tablet Take 1 tablet by mouth 3 (three) times a week.     [provider]  PARoxetine (PAXIL) 30 MG tablet TAKE 1 TABLET(30 MG) BY MOUTH EVERY MORNING 09/07/20   Merian Capron, MD  vitamin B-12 (CYANOCOBALAMIN) 500 MCG tablet Take 500 mcg by mouth. 2-3 times per week    [provider]  Zoster Vaccine Adjuvanted [facility]) injection Inject 0.5mg  IM now and again in 2-6 months. 07/22/19   [name], NP    Allergies    Patient has no known allergies.  Review of Systems   Review of Systems  Neurological: Positive for weakness.  All other systems reviewed and are negative.   Physical Exam Updated Vital Signs BP 114/65 (BP Location: Right Arm)   Pulse 86   Temp 98.6 F (37 C) (Oral)   Resp 14   Ht 5\' 4"  (1.626 m)   Wt 126.7 kg   LMP 09/11/2013   SpO2 100%   BMI 47.96 kg/m   Physical Exam Vitals and nursing note reviewed.  Constitutional:      Appearance: She is well-developed and well-nourished.  HENT:     Head: Normocephalic and atraumatic.  Cardiovascular:     Rate and Rhythm: Normal rate and regular rhythm.     Heart sounds: No murmur heard.   Pulmonary:     Effort: Pulmonary effort is normal. No respiratory distress.     Breath sounds: Normal breath sounds.   Abdominal:     Palpations: Abdomen is soft.     Tenderness: There is no abdominal tenderness. There is no guarding or rebound.  Musculoskeletal:  General: No tenderness or edema.     Comments: 2+ DP pulses bilaterally  Skin:    General: Skin is warm and dry.  Neurological:     Mental Status: She is alert and oriented to person, place, and time.     Comments: No asymmetry of facial movements. Five out of five strength and bilateral upper extremities, 5/5 strength and bilateral lower extremities. Sensation light touch intact in all four extremities. Unable to elicit patellar reflexes bilaterally. No ankle clonus. Normal gait.    Psychiatric:        Mood and Affect: Mood and affect normal.        Behavior: Behavior normal.     ED Results / Procedures / Treatments   Labs (all labs ordered are listed, but only abnormal results are displayed) Labs Reviewed  BASIC METABOLIC PANEL - Abnormal; Notable for the following components:      Result Value   Glucose, Bld 110 (*)    All other components within normal limits  CBC - Abnormal; Notable for the following components:   MCV 102.8 (*)    All other components within normal limits  URINALYSIS, ROUTINE W REFLEX MICROSCOPIC  PREGNANCY, URINE  CBG MONITORING, ED    EKG EKG Interpretation  Date/Time:  Wednesday November 17 2020 17:16:08 EST Ventricular Rate:  79 PR Interval:  154 QRS Duration: 82 QT Interval:  378 QTC Calculation: 433 R Axis:   0 Text Interpretation: Normal sinus rhythm Low voltage QRS Borderline ECG No previous tracing Confirmed by Quintella Reichert [phone]) on 11/17/2020 9:13:41 PM   Radiology No results found.  Procedures Procedures (including critical care time)  Medications Ordered in ED Medications - No data to display  ED Course  I have reviewed the triage vital signs and the nursing notes.  Pertinent labs & imaging results that were available during my care of the patient were reviewed by me and  considered in my medical decision making (see chart for details).    MDM Rules/Calculators/A&P                         patient here for evaluation of generalized weakness over the last several days with near syncopal type sensation. She is non-toxic appearing on evaluation with no focal neurologic deficits. She does have mild increase in her heart rate with standing consistent with mild ortho stasis. Patient has significantly decreased her alcohol intake over the last few days. She does not appear to be in active alcohol withdrawal. Discussed with patient increasing her oral fluid hydration with water and other nonalcoholic drinks. Presentation is not consistent with CVA, epidural abscess, transverse myelitis. Discussed outpatient follow-up and return precautions.  CBC with no significant anemia. BMP with no significant electrolyte abnormality. Final Clinical Impression(s) / ED Diagnoses Final diagnoses:  Orthostasis  Generalized weakness    Rx / DC Orders ED Discharge Orders    None       Quintella Reichert, MD 11/17/20 2330

## 2020-11-25 DIAGNOSIS — R1013 Epigastric pain: Secondary | ICD-10-CM | POA: Diagnosis not present

## 2020-11-25 DIAGNOSIS — Z9884 Bariatric surgery status: Secondary | ICD-10-CM | POA: Diagnosis not present

## 2020-11-25 NOTE — Chronic Care Management (AMB) (Signed)
Chronic Care Management Pharmacy  Name: Julie Powell  MRN: [id number] DOB: May 23, 1961  Chief Complaint/ HPI  Julie Powell,  60 y.o. , female presents for their Follow-Up CCM visit with the clinical pharmacist via telephone due to COVID-19 Pandemic.  PCP : [name], NP  Their chronic conditions include: Pre-DM, HTN, HLD, Asthma, Depression, Alcohol Use Disorder, Vitamin D Deficiency, Stomach Pain  Office Visits: 11/2520 [place]da [place]) - referral to [facility]  08/25/20: Visit w/ [name], NP - No med changes noted. Discussion about alcoholism and referral to [facility] to assist with alcohol detox.   04/21/20: Visit w/ [name], NP - Alcoholism uncontrolled. Discussed AA virtual meetings. Pt to consider. No med changes noted.   Consult Visit: 11/17/20 (ED) - complains of weakness, reports she drinks a 6 pack per day now 05/28/20: Psych visit w/ Dr. Awilda Bill - Continue lithium and wellbutrin. Join AA.   Medications: Outpatient Encounter Medications as of 12/01/2020  Medication Sig Note  . albuterol (PROAIR HFA) 108 (90 Base) MCG/ACT inhaler Inhale 2 puffs into the lungs every 6 (six) hours as needed for wheezing or shortness of breath.   Marland Kitchen buPROPion (WELLBUTRIN) 100 MG tablet TAKE 2 TABLETS(200 MG) BY MOUTH TWICE DAILY   . cholecalciferol (VITAMIN D) 1000 UNITS tablet Take 1,000 Units by mouth 3 (three) times a week.    . hyoscyamine (LEVBID) 0.375 MG 12 hr tablet Take 0.375 mg by mouth daily. 12/21/2015: Received from: External Pharmacy  . lisinopril (ZESTRIL) 10 MG tablet TAKE 1 TABLET(10 MG) BY MOUTH DAILY   . lithium carbonate (LITHOBID) 300 MG CR tablet TAKE 1 TABLET(300 MG) BY MOUTH TWICE DAILY   . Multiple Vitamin (MULTIVITAMIN) tablet Take 1 tablet by mouth 3 (three) times a week.    Marland Kitchen PARoxetine (PAXIL) 30 MG tablet TAKE 1 TABLET(30 MG) BY MOUTH EVERY MORNING   . vitamin B-12 (CYANOCOBALAMIN) 500 MCG tablet  Take 500 mcg by mouth. 2-3 times per week   . Zoster Vaccine Adjuvanted [facility]) injection Inject 0.5mg  IM now and again in 2-6 months.    No facility-administered encounter medications on file as of 12/01/2020.    Current Diagnosis/Assessment:  Goals Addressed            This Visit's Progress   . Chronic Care Management Pharmacy Care Plan       CARE PLAN ENTRY (see longitudinal plan of care for additional care plan information)  Current Barriers:  . Chronic Disease Management support, education, and care coordination needs related to Pre-DM, HTN, HLD, Asthma, Depression, Alcohol Use Disorder, Vitamin D Deficiency, Stomach Pain   Hypertension BP Readings from Last 3 Encounters:  11/17/20 114/65  08/25/20 (!) 148/65  04/21/20 113/64   . Pharmacist Clinical Goal(s): o Over the next 90 days, patient will work with PharmD and providers to maintain BP goal <140/90 . Current regimen:  o Lisinopril 10mg  daily . Interventions: o Discussed BP goal  o Discussed medication adherence o Reviewed home BP readings . Patient self care activities - Over the next 90 days, patient will: o Maintain hypertension medication regimen. o   Hyperlipidemia Lab Results  Component Value Date/Time   LDLCALC 83 02/03/2020 10:15 AM   . Pharmacist Clinical Goal(s): o Over the next 90 days, patient will work with PharmD and providers to maintain LDL goal < 100 . Current regimen:  o Diet and exercise management   . Interventions: o Discussed LDL goal . Patient self care activities -  Over the next 90 days, patient will: o Maintain LDL <100  Pre-Diabetes Lab Results  Component Value Date/Time   HGBA1C 5.7 (H) 11/20/2011 11:59 AM   . Pharmacist Clinical Goal(s): o Over the next 90 days, patient will work with PharmD and providers to maintain A1c goal <6.5% . Current regimen:  o Diet and exercise management  . Interventions: o Discussed A1c goal . Patient self care activities - Over the  next 90 days, patient will: o Maintain a1c <6.5%  Alcohol Use Disorder . Pharmacist Clinical Goal(s) o Over the next 90 days, patient will work with PharmD and providers to reduce alcohol consumptoms . Current regimen:  o None . Interventions: o Encouraged patient to walk in to [facility] urgent care as recommended by PCP . Patient self care activities - Over the next 90 days, patient will: o Follow up with [facility]al health urgent care o Contact providers with needed support  Medication management . Pharmacist Clinical Goal(s): o Over the next 90 days, patient will work with PharmD and providers to maintain optimal medication adherence . Current pharmacy: [facility] . Interventions o Comprehensive medication review performed. o Continue current medication management strategy . Patient self care activities - Over the next 90 days, patient will: o Focus on medication adherence by filling and taking medications appropriately  o Take medications as prescribed o Report any questions or concerns to PharmD and/or provider(s)  Please see past updates related to this goal by clicking on the "Past Updates" button in the selected goal        Social Hx: Moved from [place] to [place] due to cost of living. Divorced. One son.   Uses pill box  Pre-Diabetes   Recent Relevant Labs: Lab Results  Component Value Date/Time   HGBA1C 5.7 (H) 11/20/2011 11:59 AM    Patient is currently controlled on the following medications: None  We discussed: diet and exercise extensively. Recommended 150 mins of exercise and moderating carbohydrates   Update 08/26/20 Patient would benefit from updated a1c at visit in January.  Update 12/01/20 A1c not done at Jan visit, patient would benefit in future Her glucose was 110 at this visit patient unsure if she was fasting or not  Plan -Consider completing A1c at next visit in January -Continue control with diet and exercise    Hypertension   BP goal <140/90  CMP Latest Ref Rng & Units 11/17/2020 08/25/2020 02/03/2020  Glucose 70 - 99 mg/dL 110(H) 91 97  BUN 6 - 20 mg/dL 9 10 7   Creatinine 0.44 - 1.00 mg/dL 0.94 0.88 0.78  Sodium 135 - 145 mmol/L 135 135 138  Potassium 3.5 - 5.1 mmol/L 4.1 4.6 4.4  Chloride 98 - 111 mmol/L 101 100 103  CO2 22 - 32 mmol/L 24 26 28   Calcium 8.9 - 10.3 mg/dL 9.7 9.7 9.7  Total Protein 6.1 - 8.1 g/dL - 6.8 6.8  Total Bilirubin 0.2 - 1.2 mg/dL - 0.6 0.6  Alkaline Phos 39 - 117 U/L - - 157(H)  AST 10 - 35 U/L - 115(H) 18  ALT 6 - 29 U/L - 93(H) 14   Kidney Function Lab Results  Component Value Date/Time   CREATININE 0.94 11/17/2020 05:34 PM   CREATININE 0.88 08/25/2020 12:06 PM   CREATININE 0.78 02/03/2020 10:15 AM   CREATININE 0.76 02/01/2017 11:21 AM   GFR 92.29 02/03/2020 10:15 AM   GFRNONAA >60 11/17/2020 05:34 PM   GFRNONAA 67 11/20/2011 11:59 AM   GFRAA 75 (  L) 02/21/2014 08:01 PM   GFRAA 77 11/20/2011 11:59 AM   K 4.1 11/17/2020 05:34 PM   K 4.6 08/25/2020 12:06 PM   Office blood pressures are  BP Readings from Last 3 Encounters:  11/17/20 114/65  08/25/20 (!) 148/65  04/21/20 113/64    Patient has failed these meds in the past: hctz (no longer needed, was taken in combo with lisinopril) Patient is currently controlled on the following medications:   Lisinopril 10mg  daily  Patient checks BP at home infrequently  Patient home BP readings are ranging: 120s/80s "125/85"  Update 08/26/20 Denies dizziness or lightheadedness or chest pain  Update 12/01/20 120s/80s per patient report She is enjoying walking around [place] picking out plants to put around her house as this is her new hobby  Plan -Continue current medications    Alcohol Use Disorder    Patient has failed these meds in past: acamprosate (feels like it didn't work) Patient is currently uncontrolled on the following medications: None  Admits she still struggles with drinking.  States  she is currently sober, but feel it's "creeping up on her"  We discussed:  Medication options for alcohol use disorder including naltrexone. Naltrexone does not interact with any of her current medications and she is willing to try this and discuss it further with Dr. De Nurse.   Update 08/26/20 Baseline is 13 beers/day Was given acamprosate from Dr. De Nurse. Has not used it. Has no identifiable reason why. She feels she is doing better.  Feels she is at a 9 on a 10 point scale of reducing her alcohol consumption. (1 do not want to quit, 10 wish I quit yesterday) Feels she lives in an alternate universe due to all of the alcohol consumption. Yesterday only had 2 beers.  She states she was previously declined from a detox program, so she is hesitant about trying again. Active steps: Won't buy 12 pack anymore. Drinks enough not to have nervousness or jumpiness. When she goes to the store she buys 2 beers.   Reinforced calling [facility] for detox. She is hesitant about calling.  Encouraged her to call to get more information.  States she will make plan to call today or tomorrow.  Update 12/01/20 She is drinking 12-15 beers per day Knows she needs to do something about it  She was given the [facility] urgent care and she plans to walk in one day soon just not today.  She reports her mood is fairly good lately and sleep is adequate. Reinforced our support for her and encouraged her to continue working and not give up. Patient seems to want to do this for herself and has plans to report to Urgent Care within the next few days for an evaluation.  Plan Report to Urgent Care, contact providers for any support necessary  Vaccines   Reviewed and discussed patient's vaccination history.    Immunization History  Administered Date(s) Administered  . Influenza,inj,Quad PF,6+ Mos 08/12/2014, 09/22/2015, 09/13/2016, 08/07/2017, 10/29/2018, 07/09/2019  .  PFIZER(Purple Top)SARS-COV-2 Vaccination 07/06/2020, 07/27/2020  . Pneumococcal Polysaccharide-23 08/29/2011  . Tdap 11/13/2014    Plan -Recommended patient receive Shingrix vaccine in pharmacy. -Recommended patient receive COVID vaccine where available.    Miscellaneous Meds  Multivitamin B12 536mcg 2-3 times weekly (2564mcg that she hasn't started yet, last B12 on 02/03/20 = 809)  Beverly Milch, PharmD Clinical Pharmacist [place] [phone]

## 2020-11-30 ENCOUNTER — Other Ambulatory Visit: Payer: Self-pay

## 2020-11-30 ENCOUNTER — Telehealth (INDEPENDENT_AMBULATORY_CARE_PROVIDER_SITE_OTHER): Payer: Medicare Other | Admitting: Family

## 2020-11-30 DIAGNOSIS — F1010 Alcohol abuse, uncomplicated: Secondary | ICD-10-CM

## 2020-11-30 DIAGNOSIS — F32A Depression, unspecified: Secondary | ICD-10-CM

## 2020-11-30 DIAGNOSIS — I10 Essential (primary) hypertension: Secondary | ICD-10-CM | POA: Diagnosis not present

## 2020-11-30 NOTE — Progress Notes (Signed)
Virtual Visit via Video Note  I connected with [name] on 11/30/20 at 11:00 AM EST by a video enabled telemedicine application and verified that I am speaking with the correct person using two identifiers.  Location: Patient: home Provider: home   I discussed the limitations of evaluation and management by telemedicine and the availability of in person appointments. The patient expressed understanding and agreed to proceed. Only the patient and myself were present for today's video call.   History of Present Illness:  Patient is a 60 yr old female who presents today for follow up.  She reports that she is disappointment in her health care providers for not helping her more with her alcoholism.  States she is currently drinking 12-15 beers a day.  "I am just sitting her dying."  She last saw her psychiatrist back in November. She reports that she remains very depressed, but denies any active suicidal ideations.   HTN- maintained on lisinopril 10mg .  BP Readings from Last 3 Encounters:  11/17/20 114/65  08/25/20 (!) 148/65  04/21/20 113/64   She did have a recent visit to the ED on 1/12 due to weakness and was noted to be orthostatic/dehydrated.  She reports that she has been increasing her water intake and that weakness has resolved.    Observations/Objective:   Gen: Awake, alert, no acute distress Resp: Breathing is even and non-labored Psych: flat affect Neuro: Alert and Oriented x 3, + facial symmetry, speech is clear.   Assessment and Plan:  Depression- uncontrolled. This is being managed by psychiatry.   Alcohol abuse-  She states she has reached out to several alcohol rehab centers, but none of them take Medicare which is her primary insurance.  She states that she wants to get better but lacks the willpower to stop on her own or the motivation to navigate the system.  I did some research about local facilities and I was unable to find any local rehabs that accept  medicare either.  I did speak to the [facility] and they told me that if patient walks in, they will do an assessment and can make appropriate referrals/transfers if deemed medically appropriate.  I gave the patient their number and address and encouraged her to go to their Urgent Care for further evaluation.  HTN- recent BP in the ED looked good.  She was felt to be a bit dehydrated that day, so it may have been lower than normal.  Her BMET looked ok on 1/12. Continue lisinopril 10mg  once daily.   Recommended that she follow up in 3 months with me for a face to face visit- sooner if probems/concerns.  Follow Up Instructions:    I discussed the assessment and treatment plan with the patient. The patient was provided an opportunity to ask questions and all were answered. The patient agreed with the plan and demonstrated an understanding of the instructions.   The patient was advised to call back or seek an in-person evaluation if the symptoms worsen or if the condition fails to improve as anticipated.  Nance Pear, NP

## 2020-12-01 ENCOUNTER — Ambulatory Visit: Payer: Medicare Other

## 2020-12-01 DIAGNOSIS — I10 Essential (primary) hypertension: Secondary | ICD-10-CM

## 2020-12-01 DIAGNOSIS — F1010 Alcohol abuse, uncomplicated: Secondary | ICD-10-CM

## 2020-12-01 NOTE — Patient Instructions (Addendum)
Visit Information  Goals Addressed            This Visit's Progress   . Chronic Care Management Pharmacy Care Plan       CARE PLAN ENTRY (see longitudinal plan of care for additional care plan information)  Current Barriers:  . Chronic Disease Management support, education, and care coordination needs related to Pre-DM, HTN, HLD, Asthma, Depression, Alcohol Use Disorder, Vitamin D Deficiency, Stomach Pain   Hypertension BP Readings from Last 3 Encounters:  11/17/20 114/65  08/25/20 (!) 148/65  04/21/20 113/64   . Pharmacist Clinical Goal(s): o Over the next 90 days, patient will work with PharmD and providers to maintain BP goal <140/90 . Current regimen:  o Lisinopril 10mg  daily . Interventions: o Discussed BP goal  o Discussed medication adherence o Reviewed home BP readings . Patient self care activities - Over the next 90 days, patient will: o Maintain hypertension medication regimen. o   Hyperlipidemia Lab Results  Component Value Date/Time   LDLCALC 83 02/03/2020 10:15 AM   . Pharmacist Clinical Goal(s): o Over the next 90 days, patient will work with PharmD and providers to maintain LDL goal < 100 . Current regimen:  o Diet and exercise management   . Interventions: o Discussed LDL goal . Patient self care activities - Over the next 90 days, patient will: o Maintain LDL <100  Pre-Diabetes Lab Results  Component Value Date/Time   HGBA1C 5.7 (H) 11/20/2011 11:59 AM   . Pharmacist Clinical Goal(s): o Over the next 90 days, patient will work with PharmD and providers to maintain A1c goal <6.5% . Current regimen:  o Diet and exercise management  . Interventions: o Discussed A1c goal . Patient self care activities - Over the next 90 days, patient will: o Maintain a1c <6.5%  Alcohol Use Disorder . Pharmacist Clinical Goal(s) o Over the next 90 days, patient will work with PharmD and providers to reduce alcohol consumptoms . Current regimen:   o None . Interventions: o Encouraged patient to walk in to [facility] as recommended by PCP . Patient self care activities - Over the next 90 days, patient will: o Follow up with [facility] urgent care o Contact providers with needed support  Medication management . Pharmacist Clinical Goal(s): o Over the next 90 days, patient will work with PharmD and providers to maintain optimal medication adherence . Current pharmacy: [facility] . Interventions o Comprehensive medication review performed. o Continue current medication management strategy . Patient self care activities - Over the next 90 days, patient will: o Focus on medication adherence by filling and taking medications appropriately  o Take medications as prescribed o Report any questions or concerns to PharmD and/or provider(s)  Please see past updates related to this goal by clicking on the "Past Updates" button in the selected goal         The patient verbalized understanding of instructions, educational materials, and care plan provided today and agreed to receive a mailed copy of patient instructions, educational materials, and care plan.   Telephone follow up appointment with pharmacy team member scheduled for: 3 months  Edythe Clarity, [facility]  Hypertension, Adult High blood pressure (hypertension) is when the force of blood pumping through the arteries is too strong. The arteries are the blood vessels that carry blood from the heart throughout the body. Hypertension forces the heart to work harder to pump blood and may cause arteries to become narrow or stiff. Untreated  or uncontrolled hypertension can cause a heart attack, heart failure, a stroke, kidney disease, and other problems. A blood pressure reading consists of a higher number over a lower number. Ideally, your blood pressure should be below 120/80. The first ("top") number is called the systolic pressure. It is a  measure of the pressure in your arteries as your heart beats. The second ("bottom") number is called the diastolic pressure. It is a measure of the pressure in your arteries as the heart relaxes. What are the causes? The exact cause of this condition is not known. There are some conditions that result in or are related to high blood pressure. What increases the risk? Some risk factors for high blood pressure are under your control. The following factors may make you more likely to develop this condition:  Smoking.  Having type 2 diabetes mellitus, high cholesterol, or both.  Not getting enough exercise or physical activity.  Being overweight.  Having too much fat, sugar, calories, or salt (sodium) in your diet.  Drinking too much alcohol. Some risk factors for high blood pressure may be difficult or impossible to change. Some of these factors include:  Having chronic kidney disease.  Having a family history of high blood pressure.  Age. Risk increases with age.  Race. You may be at higher risk if you are African American.  Gender. Men are at higher risk than women before age 66. After age 73, women are at higher risk than men.  Having obstructive sleep apnea.  Stress. What are the signs or symptoms? High blood pressure may not cause symptoms. Very high blood pressure (hypertensive crisis) may cause:  Headache.  Anxiety.  Shortness of breath.  Nosebleed.  Nausea and vomiting.  Vision changes.  Severe chest pain.  Seizures. How is this diagnosed? This condition is diagnosed by measuring your blood pressure while you are seated, with your arm resting on a flat surface, your legs uncrossed, and your feet flat on the floor. The cuff of the blood pressure monitor will be placed directly against the skin of your upper arm at the level of your heart. It should be measured at least twice using the same arm. Certain conditions can cause a difference in blood pressure between  your right and left arms. Certain factors can cause blood pressure readings to be lower or higher than normal for a short period of time:  When your blood pressure is higher when you are in a health care provider's office than when you are at home, this is called white coat hypertension. Most people with this condition do not need medicines.  When your blood pressure is higher at home than when you are in a health care provider's office, this is called masked hypertension. Most people with this condition may need medicines to control blood pressure. If you have a high blood pressure reading during one visit or you have normal blood pressure with other risk factors, you may be asked to:  Return on a different day to have your blood pressure checked again.  Monitor your blood pressure at home for 1 week or longer. If you are diagnosed with hypertension, you may have other blood or imaging tests to help your health care provider understand your overall risk for other conditions. How is this treated? This condition is treated by making healthy lifestyle changes, such as eating healthy foods, exercising more, and reducing your alcohol intake. Your health care provider may prescribe medicine if lifestyle changes are not enough  to get your blood pressure under control, and if:  Your systolic blood pressure is above 130.  Your diastolic blood pressure is above 80. Your personal target blood pressure may vary depending on your medical conditions, your age, and other factors. Follow these instructions at home: Eating and drinking  Eat a diet that is high in fiber and potassium, and low in sodium, added sugar, and fat. An example eating plan is called the DASH (Dietary Approaches to Stop Hypertension) diet. To eat this way: ? Eat plenty of fresh fruits and vegetables. Try to fill one half of your plate at each meal with fruits and vegetables. ? Eat whole grains, such as whole-wheat pasta, brown rice, or  whole-grain bread. Fill about one fourth of your plate with whole grains. ? Eat or drink low-fat dairy products, such as skim milk or low-fat yogurt. ? Avoid fatty cuts of meat, processed or cured meats, and poultry with skin. Fill about one fourth of your plate with lean proteins, such as fish, chicken without skin, beans, eggs, or tofu. ? Avoid pre-made and processed foods. These tend to be higher in sodium, added sugar, and fat.  Reduce your daily sodium intake. Most people with hypertension should eat less than 1,500 mg of sodium a day.  Do not drink alcohol if: ? Your health care provider tells you not to drink. ? You are pregnant, may be pregnant, or are planning to become pregnant.  If you drink alcohol: ? Limit how much you use to:  0-1 drink a day for women.  0-2 drinks a day for men. ? Be aware of how much alcohol is in your drink. In the [place] one drink equals one 12 oz bottle of beer (355 mL), one 5 oz glass of wine (148 mL), or one 1 oz glass of hard liquor (44 mL).   Lifestyle  Work with your health care provider to maintain a healthy body weight or to lose weight. Ask what an ideal weight is for you.  Get at least 30 minutes of exercise most days of the week. Activities may include walking, swimming, or biking.  Include exercise to strengthen your muscles (resistance exercise), such as Pilates or lifting weights, as part of your weekly exercise routine. Try to do these types of exercises for 30 minutes at least 3 days a week.  Do not use any products that contain nicotine or tobacco, such as cigarettes, e-cigarettes, and chewing tobacco. If you need help quitting, ask your health care provider.  Monitor your blood pressure at home as told by your health care provider.  Keep all follow-up visits as told by your health care provider. This is important.   Medicines  Take over-the-counter and prescription medicines only as told by your health care provider. Follow  directions carefully. Blood pressure medicines must be taken as prescribed.  Do not skip doses of blood pressure medicine. Doing this puts you at risk for problems and can make the medicine less effective.  Ask your health care provider about side effects or reactions to medicines that you should watch for. Contact a health care provider if you:  Think you are having a reaction to a medicine you are taking.  Have headaches that keep coming back (recurring).  Feel dizzy.  Have swelling in your ankles.  Have trouble with your vision. Get help right away if you:  Develop a severe headache or confusion.  Have unusual weakness or numbness.  Feel faint.  Have severe pain  in your chest or abdomen.  Vomit repeatedly.  Have trouble breathing. Summary  Hypertension is when the force of blood pumping through your arteries is too strong. If this condition is not controlled, it may put you at risk for serious complications.  Your personal target blood pressure may vary depending on your medical conditions, your age, and other factors. For most people, a normal blood pressure is less than 120/80.  Hypertension is treated with lifestyle changes, medicines, or a combination of both. Lifestyle changes include losing weight, eating a healthy, low-sodium diet, exercising more, and limiting alcohol. This information is not intended to replace advice given to you by your health care provider. Make sure you discuss any questions you have with your health care provider. Document Revised: 07/03/2018 Document Reviewed: 07/03/2018 Elsevier Patient Education  2021 [employer].

## 2020-12-05 ENCOUNTER — Other Ambulatory Visit (HOSPITAL_COMMUNITY): Payer: Self-pay | Admitting: Psychiatry

## 2020-12-05 ENCOUNTER — Other Ambulatory Visit: Payer: Self-pay | Admitting: Family

## 2020-12-05 DIAGNOSIS — I10 Essential (primary) hypertension: Secondary | ICD-10-CM

## 2020-12-05 DIAGNOSIS — F332 Major depressive disorder, recurrent severe without psychotic features: Secondary | ICD-10-CM

## 2020-12-06 ENCOUNTER — Telehealth: Payer: Self-pay | Admitting: Pharmacist

## 2020-12-06 NOTE — Progress Notes (Signed)
° ° °  Chronic Care Management Pharmacy Assistant   Name: Lyndall Bellot  MRN: [id number] DOB: 07/20/1961  Reason for Encounter: Adherence Review   PCP : [name], NP  Verified Adherence Gap Information. Per insurance data patient has met their annual wellness and wellness bundle screening. The patients most recent blood pressure was 148/65 on 08/25/20. The patients total gap-all measures is equal to 1.  Follow-Up:  Pharmacist Review

## 2020-12-08 ENCOUNTER — Telehealth (INDEPENDENT_AMBULATORY_CARE_PROVIDER_SITE_OTHER): Payer: Medicare Other | Admitting: Psychiatry

## 2020-12-08 DIAGNOSIS — F332 Major depressive disorder, recurrent severe without psychotic features: Secondary | ICD-10-CM

## 2020-12-08 DIAGNOSIS — F1020 Alcohol dependence, uncomplicated: Secondary | ICD-10-CM | POA: Diagnosis not present

## 2020-12-09 ENCOUNTER — Encounter (HOSPITAL_COMMUNITY): Payer: Self-pay | Admitting: Psychiatry

## 2020-12-09 NOTE — Progress Notes (Signed)
Patient ID: Earnie Rockhold, female   DOB: December 22, 1960, 60 y.o.   MRN: [id number] [facility] Follow-up Outpatient Visit  Sandrine Bloodsworth [birth year]  Date:12/08/20    Virtual Visit via Telephone Note  I connected with [name] on 12/08/20 at  1:30 PM EST by telephone and verified that I am speaking with the correct person using two identifiers.  Location: Patient: home  Provider: home office   I discussed the limitations, risks, security and privacy concerns of performing an evaluation and management service by telephone and the availability of in person appointments. I also discussed with the patient that there may be a patient responsible charge related to this service. The patient expressed understanding and agreed to proceed.       I discussed the assessment and treatment plan with the patient. The patient was provided an opportunity to ask questions and all were answered. The patient agreed with the plan and demonstrated an understanding of the instructions.   The patient was advised to call back or seek an in-person evaluation if the symptoms worsen or if the condition fails to improve as anticipated.  I provided 12 minutes of non-face-to-face time during this encounter.   Merian Capron, MD   CC  depression follow up  HPI Comments: Ms. Fidalgo is a 60   Y/O woman with a past psychiatric history Major Depressive Disorder, severe, recurrent. Mood disorder due to [facility], Alcohol use disorder and Social Phobia.    Still struggles with alcohol and reaching out to get involved in programs Mood wise fair but gets subdued, gets lonely and that makes her drink   She understands the risk  Aggravating factor: lonliness Modifying factor: housing son  . Duration: since young age . Timing: more so in evening  . Context: alcohol use history\  . Associated signs and symptoms: Denies psychosis   Traumatic Brain Injury: No none   Review of Systems  Cardiovascular:  Negative for chest pain.  Skin: Negative for itching.  Neurological: Negative for headaches.  Psychiatric/Behavioral: Positive for substance abuse. Negative for suicidal ideas.   There were no vitals filed for this visit. Physical Exam  Constitutional: She appears well-developed and well-nourished. No distress.  Skin: She is not diaphoretic.  No tremors of hands noted while patient was interviewed.     Past Medical History: Reviewed  Past Medical History:  Diagnosis Date  . Anxiety   . Asthma   . B12 deficiency   . Bipolar 1 disorder ([place]) 2010   ect  . Fibroids   . Hypertension   . Plantar fasciitis 2013  . Sciatica    RT hip  . Vitamin D deficiency     Allergies: No Known Allergies   Current Medications: Reviewed   Current Outpatient Medications on File Prior to Visit  Medication Sig Dispense Refill  . albuterol (PROAIR HFA) 108 (90 Base) MCG/ACT inhaler Inhale 2 puffs into the lungs every 6 (six) hours as needed for wheezing or shortness of breath. 20.1 g 1  . buPROPion (WELLBUTRIN) 100 MG tablet TAKE 2 TABLETS(200 MG) BY MOUTH TWICE DAILY 360 tablet 0  . cholecalciferol (VITAMIN D) 1000 UNITS tablet Take 1,000 Units by mouth 3 (three) times a week.     . hyoscyamine (LEVBID) 0.375 MG 12 hr tablet Take 0.375 mg by mouth daily.  0  . lisinopril (ZESTRIL) 10 MG tablet Take 1 tablet (10 mg total) by mouth daily. 90 tablet 1  . lithium carbonate (LITHOBID) 300 MG CR tablet  TAKE 1 TABLET(300 MG) BY MOUTH TWICE DAILY 180 tablet 0  . Multiple Vitamin (MULTIVITAMIN) tablet Take 1 tablet by mouth 3 (three) times a week.     Marland Kitchen PARoxetine (PAXIL) 30 MG tablet TAKE 1 TABLET(30 MG) BY MOUTH EVERY MORNING 90 tablet 0  . vitamin B-12 (CYANOCOBALAMIN) 500 MCG tablet Take 500 mcg by mouth. 2-3 times per week    . Zoster Vaccine Adjuvanted [facility]) injection Inject 0.5mg  IM now and again in 2-6 months. 0.5 mL 1   No current facility-administered medications on file prior to visit.       Family History: Reviewed  Family History   Problem  Relation  Age of Onset   .  Adopted: Yes   .  Aneurysm  Father     Psychiatric Specialty Exam:  Objective: Appearance:   Eye Contact::   Speech: Clear and Coherent and Normal Rate   Volume: Normal   Mood  Somewhat subdued  Affect: congruent  Thought Process: Goal Directed, Logical and Loose   Orientation: Full   Thought Content: WDL   Suicidal Thoughts: Patient denies   Homicidal Thoughts: Patient denies   Judgement: Fair   Insight: Fair   Psychomotor Activity: Normal   Akathisia: No   Handed: Right   Memory: Immediate 3/3, Recent: 1/3   [place] of knowledge-average to above average.  AIMS (if indicated): Not indicated at this time.   Assets: Communication Skills  Desire for Improvement    Assessment:  AXIS I: Major Depressive Disorder, ; GAD: insomnia: Alcohol use disorder Treatment Plan/Recommendations:   Plan of Care:  Provided supportive therapy. Reviewed side effects . Sent prescriptions.       .   Medications:  Continue medications with no change::   Bipolar depression: subdued, continue meds, highly encourage to get help for alcohol Gave [facility] , [facility] information  Discussed detox and to report to [facility] if needed Continue meds understand they would not help effectively unless alcohol is taken care of  Anxiety: fluctuates, continue paxil   Alcohol use: see above   Fu 6 weeks or earlier. .    refills sent due were sent            Merian Capron, M.D.  12/09/2020 8:52 AM

## 2021-01-05 ENCOUNTER — Telehealth: Payer: Self-pay | Admitting: Pharmacist

## 2021-01-05 NOTE — Progress Notes (Addendum)
° ° °  Chronic Care Management Pharmacy Assistant   Name: Kely Dohn  MRN: [id number] DOB: Oct 30, 1961  Reason for Encounter: General Disease State Call  Patient Questions:  1.  Have you seen any other providers since your last visit? No.    2.  Any changes in your medicines or health? No.   PCP : [name], NP   Their chronic conditions include: Pre-DM, HTN, HLD, Asthma, Depression, Alcohol Use Disorder, Vitamin D Deficiency, Stomach Pain.  Office Visits:  None since 12/06/20  Consults: None since 12/06/20  Allergies:  No Known Allergies  Medications: Outpatient Encounter Medications as of 01/05/2021  Medication Sig Note   albuterol (PROAIR HFA) 108 (90 Base) MCG/ACT inhaler Inhale 2 puffs into the lungs every 6 (six) hours as needed for wheezing or shortness of breath.    buPROPion (WELLBUTRIN) 100 MG tablet TAKE 2 TABLETS(200 MG) BY MOUTH TWICE DAILY    cholecalciferol (VITAMIN D) 1000 UNITS tablet Take 1,000 Units by mouth 3 (three) times a week.     hyoscyamine (LEVBID) 0.375 MG 12 hr tablet Take 0.375 mg by mouth daily. 12/21/2015: Received from: External Pharmacy   lisinopril (ZESTRIL) 10 MG tablet Take 1 tablet (10 mg total) by mouth daily.    lithium carbonate (LITHOBID) 300 MG CR tablet TAKE 1 TABLET(300 MG) BY MOUTH TWICE DAILY    Multiple Vitamin (MULTIVITAMIN) tablet Take 1 tablet by mouth 3 (three) times a week.     PARoxetine (PAXIL) 30 MG tablet TAKE 1 TABLET(30 MG) BY MOUTH EVERY MORNING    vitamin B-12 (CYANOCOBALAMIN) 500 MCG tablet Take 500 mcg by mouth. 2-3 times per week    Zoster Vaccine Adjuvanted [facility]) injection Inject 0.5mg  IM now and again in 2-6 months.    No facility-administered encounter medications on file as of 01/05/2021.    Current Diagnosis: Patient Active Problem List   Diagnosis Date Noted   Depression 04/07/2019   Plantar fasciitis 04/07/2019   Alcohol abuse 12/12/2016   Hyperlipidemia 05/13/2015   Bariatric surgery status  12/31/2014   Vitamin B 12 deficiency 12/31/2014   Preventative health care 11/13/2014   Multinodular goiter 08/27/2014   Vitamin D deficiency 08/13/2014   Asthma 08/13/2014   Sciatica 08/13/2014   Major depressive disorder, recurrent, severe w/o psychotic behavior ([place]) 06/11/2013   Leiomyoma uteri 06/20/2011   Hypertension 05/23/2011   Obesity (BMI 30-39.9) 05/23/2011    Goals Addressed   None    GEN Call: Patient stated she does not do any regular exercising but she does go out and about and cooks and clean at home. Patient stated most of the time she does not eat healthy food but she does try to eat healthy food sometimes. She eats some vegetables like spinach and broccoli. Patient stated she does drink water but she could drink more. Patient stated she does not have any questions or concerns about her medications at this time.    Follow-Up:  Pharmacist Review   Charlann Lange, RMA Clinical Pharmacist Assistant [phone]  6 minutes spent in review, coordination, and documentation.  Reviewed by: Beverly Milch, PharmD Clinical Pharmacist [facility] [phone]

## 2021-02-02 ENCOUNTER — Encounter (HOSPITAL_COMMUNITY): Payer: Self-pay | Admitting: Psychiatry

## 2021-02-02 ENCOUNTER — Telehealth (INDEPENDENT_AMBULATORY_CARE_PROVIDER_SITE_OTHER): Payer: Medicare Other | Admitting: Psychiatry

## 2021-02-02 DIAGNOSIS — F4010 Social phobia, unspecified: Secondary | ICD-10-CM

## 2021-02-02 DIAGNOSIS — F1020 Alcohol dependence, uncomplicated: Secondary | ICD-10-CM

## 2021-02-02 DIAGNOSIS — F3130 Bipolar disorder, current episode depressed, mild or moderate severity, unspecified: Secondary | ICD-10-CM

## 2021-02-02 MED ORDER — PAROXETINE HCL 30 MG PO TABS: ORAL_TABLET | ORAL | 0 refills | Status: DC

## 2021-02-02 MED ORDER — ACAMPROSATE CALCIUM 333 MG PO TBEC: 333.0000 mg | DELAYED_RELEASE_TABLET | Freq: Two times a day (BID) | ORAL | 1 refills | Status: DC

## 2021-02-02 NOTE — Progress Notes (Signed)
Patient ID: Julie Powell, female   DOB: [birth year], 60 y.o.   MRN: [id number] [name] Follow-up Outpatient Visit  Julie Powell October 13, 1961  02/02/21    Virtual Visit via Telephone Note  I connected with Julie Powell on 02/02/21 at  1:30 PM EDT by telephone and verified that I am speaking with the correct person using two identifiers.  Location: Patient: home Provider: home office   I discussed the limitations, risks, security and privacy concerns of performing an evaluation and management service by telephone and the availability of in person appointments. I also discussed with the patient that there may be a patient responsible charge related to this service. The patient expressed understanding and agreed to proceed.     I discussed the assessment and treatment plan with the patient. The patient was provided an opportunity to ask questions and all were answered. The patient agreed with the plan and demonstrated an understanding of the instructions.   The patient was advised to call back or seek an in-person evaluation if the symptoms worsen or if the condition fails to improve as anticipated.  I provided 11  minutes of non-face-to-face time during this encounter.         CC  depression follow up  HPI Comments: Julie Powell is a 60   Y/O woman with a past psychiatric history Major Depressive Disorder, severe, recurrent. Mood disorder due to [facility], Alcohol use disorder and Social Phobia.    Struggles with alcohol use, says would contact AA for virtual meetings meds keep some balance but difficult to lower alcohol use   Understands the risk and helps , referral available Aggravating factor: lonliness Modifying factor: housing son  . Duration: since young age . Timing: more so in evening  . Context: alcohol use history\  . Associated signs and symptoms: Denies psychosis   Traumatic Brain Injury: No none   Review of Systems  Cardiovascular: Negative  for chest pain.  Skin: Negative for itching.  Neurological: Negative for headaches.  Psychiatric/Behavioral: Positive for substance abuse. Negative for suicidal ideas.   There were no vitals filed for this visit. Physical Exam  Constitutional: Julie Powell appears well-developed and well-nourished. No distress.  Skin: Julie Powell is not diaphoretic.  No tremors of hands noted while patient was interviewed.     Past Medical History: Reviewed  Past Medical History:  Diagnosis Date  . Anxiety   . Asthma   . B12 deficiency   . Bipolar 1 disorder ([place]) 2010   ect  . Fibroids   . Hypertension   . Plantar fasciitis 2013  . Sciatica    RT hip  . Vitamin D deficiency     Allergies: No Known Allergies   Current Medications: Reviewed   Current Outpatient Medications on File Prior to Visit  Medication Sig Dispense Refill  . albuterol (PROAIR HFA) 108 (90 Base) MCG/ACT inhaler Inhale 2 puffs into the lungs every 6 (six) hours as needed for wheezing or shortness of breath. 20.1 g 1  . buPROPion (WELLBUTRIN) 100 MG tablet TAKE 2 TABLETS(200 MG) BY MOUTH TWICE DAILY 360 tablet 0  . cholecalciferol (VITAMIN D) 1000 UNITS tablet Take 1,000 Units by mouth 3 (three) times a week.     . hyoscyamine (LEVBID) 0.375 MG 12 hr tablet Take 0.375 mg by mouth daily.  0  . lisinopril (ZESTRIL) 10 MG tablet Take 1 tablet (10 mg total) by mouth daily. 90 tablet 1  . lithium carbonate (LITHOBID) 300 MG CR tablet TAKE 1  TABLET(300 MG) BY MOUTH TWICE DAILY 180 tablet 0  . Multiple Vitamin (MULTIVITAMIN) tablet Take 1 tablet by mouth 3 (three) times a week.     . vitamin B-12 (CYANOCOBALAMIN) 500 MCG tablet Take 500 mcg by mouth. 2-3 times per week    . Zoster Vaccine Adjuvanted [facility]) injection Inject 0.5mg  IM now and again in 2-6 months. 0.5 mL 1   No current facility-administered medications on file prior to visit.      Family History: Reviewed  Family History   Problem  Relation  Age of Onset   .  Adopted:  Yes   .  Aneurysm  Father     Psychiatric Specialty Exam:  Objective: Appearance:   Eye Contact::   Speech: Clear and Coherent and Normal Rate   Volume: Normal   Mood  Somewhat subdued  Affect: congruent  Thought Process: Goal Directed, Logical and Loose   Orientation: Full   Thought Content: WDL   Suicidal Thoughts: Patient denies   Homicidal Thoughts: Patient denies   Judgement: Fair   Insight: Fair   Psychomotor Activity: Normal   Akathisia: No   Handed: Right   Memory: Immediate 3/3, Recent: 1/3   Landmark of knowledge-average to above average.  AIMS (if indicated): Not indicated at this time.   Assets: Communication Skills  Desire for Improvement    Assessment:  AXIS I: Major Depressive Disorder, ; GAD: insomnia: Alcohol use disorder Treatment Plan/Recommendations:   Plan of Care:  Provided supportive therapy. Reviewed side effects . Sent prescriptions.       .   Medications:  Continue medications with no change::   Bipolar depression: subdued, continue lithium, discussed AA and programs No side effects Understands to get blood work , says feels difficult getting out of home Anxiety: fluctuates, continue paxil, recommend therapy, patient to call office to schedule   Alcohol use: see above . Will write campral for craving it has helped before   Fu 6 weeks or earlier. .    refills sent due were sent            Merian Capron, M.D.  02/02/2021 1:55 PM

## 2021-03-01 ENCOUNTER — Ambulatory Visit (INDEPENDENT_AMBULATORY_CARE_PROVIDER_SITE_OTHER): Payer: Medicare Other | Admitting: Pharmacist

## 2021-03-01 DIAGNOSIS — R748 Abnormal levels of other serum enzymes: Secondary | ICD-10-CM

## 2021-03-01 DIAGNOSIS — F332 Major depressive disorder, recurrent severe without psychotic features: Secondary | ICD-10-CM

## 2021-03-01 DIAGNOSIS — F32A Depression, unspecified: Secondary | ICD-10-CM | POA: Diagnosis not present

## 2021-03-01 DIAGNOSIS — I10 Essential (primary) hypertension: Secondary | ICD-10-CM | POA: Diagnosis not present

## 2021-03-01 DIAGNOSIS — F1010 Alcohol abuse, uncomplicated: Secondary | ICD-10-CM

## 2021-03-01 NOTE — Chronic Care Management (AMB) (Signed)
Chronic Care Management Pharmacy Note  03/01/2021 Name:  Toshiba Null MRN:  [id number] DOB:  [birth year]  Subjective: Julie Powell is an 60 y.o. year old female who is a primary patient of [name], NP.  The CCM team was consulted for assistance with disease management and care coordination needs.    Engaged with patient by telephone for follow up visit in response to provider referral for pharmacy case management and/or care coordination services.   Consent to Services:  The patient was given information about Chronic Care Management services, agreed to services, and gave verbal consent prior to initiation of services.  Please see initial visit note for detailed documentation.   Patient Care Team: [name], NP as PCP - General (Internal Medicine) Merian Capron, MD as Consulting Physician (Psychiatry) Renato Shin, MD as Consulting Physician (Endocrinology) Lucienne Capers, MD as Consulting Physician (Gastroenterology) Cameron Sprang, MD as Consulting Physician (Neurology) Cherre Robins, PharmD (Pharmacist)  Recent office visits: 11/30/20 PCP Inda Castle) - Video Visit. Referred to [facility] 08/25/20: PCP ([place]) - No med changes noted. Discussion about alcohol use and referred to [facility]   Recent consult visits: 02/02/2021 - Dr De Nurse - Behavioral health. Video Visit. Started Campral 344m twice a day 11/25/2020 - Dr KShary Key- GI with [facility] - Seen for abdominal pain f/u. No med changes - continue hyoscamine 3.753mdaily; Declined colonoscopy at this time  ED Visit 11/17/2020 - [facility]: See for weakness and orthostasis. No med changes  Hospital visits:  None in previous 6 months  Objective:  Lab Results  Component Value Date   CREATININE 0.94 11/17/2020   CREATININE 0.88 08/25/2020   CREATININE 0.78 02/03/2020    Lab Results  Component Value Date   HGBA1C 5.7 (H) 11/20/2011        Component Value Date/Time   CHOL 254 (H) 02/03/2020 1015   TRIG 72.0 02/03/2020 1015   HDL 156.50 02/03/2020 1015   CHOLHDL 2 02/03/2020 1015   VLDL 14.4 02/03/2020 1015   LDLCALC 83 02/03/2020 1015    Hepatic Function Latest Ref Rng & Units 08/25/2020 02/03/2020 07/22/2019  Total Protein 6.1 - 8.1 g/dL 6.8 6.8 6.6  Albumin 3.5 - 5.2 g/dL - 4.0 3.9  AST 10 - 35 U/L 115(H) 18 17  ALT 6 - 29 U/L 93(H) 14 17  Alk Phosphatase 39 - 117 U/L - 157(H) 116  Total Bilirubin 0.2 - 1.2 mg/dL 0.6 0.6 0.5  Bilirubin, Direct 0.0 - 0.3 mg/dL - - 0.1    Lab Results  Component Value Date/Time   TSH 4.10 08/25/2020 12:06 PM   TSH 2.04 07/22/2019 10:56 AM   FREET4 0.70 09/22/2015 10:21 AM    CBC Latest Ref Rng & Units 11/17/2020 08/25/2020 11/25/2018  WBC 4.0 - 10.5 K/uL 7.6 4.7 5.2  Hemoglobin 12.0 - 15.0 g/dL 13.4 13.9 12.5  Hematocrit 36.0 - 46.0 % 40.9 41.9 37.4  Platelets 150 - 400 K/uL 269 229 233.0    Lab Results  Component Value Date/Time   VD25OH 32.21 03/22/2016 05:35 PM   VD25OH 39.28 12/21/2015 11:05 AM    Clinical ASCVD: No  The ASCVD Risk score (GoRigby et al., 2013) failed to calculate for the following reasons:   The valid HDL cholesterol range is 20 to 100 mg/dL    Other: See below  Social History   Tobacco Use  Smoking Status Former Smoker  . Packs/day: 0.50  . Years: 10.00  .  Pack years: 5.00  . Types: Cigarettes  . Quit date: 11/07/1991  . Years since quitting: 29.3  Smokeless Tobacco Never Used   BP Readings from Last 3 Encounters:  11/17/20 114/65  08/25/20 (!) 148/65  04/21/20 113/64   Pulse Readings from Last 3 Encounters:  11/17/20 86  08/25/20 78  04/21/20 84   Wt Readings from Last 3 Encounters:  11/17/20 279 lb 6.4 oz (126.7 kg)  08/25/20 276 lb (125.2 kg)  04/21/20 278 lb (126.1 kg)    Assessment: Review of patient past medical history, allergies, medications, health status, including review of consultants reports, laboratory  and other test data, was performed as part of comprehensive evaluation and provision of chronic care management services.    SDOH:  (Social Determinants of Health) assessments and interventions performed:  SDOH Interventions   Flowsheet Row Most Recent Value  SDOH Interventions   Financial Strain Interventions Intervention Not Indicated  Physical Activity Interventions Other (Comments)  [discussed increase walkin / exercise]  Alcohol Brief Interventions/Follow-up --  [Patient is being seen by behavioral health]      CCM Care Plan  No Known Allergies  Medications Reviewed Today    Reviewed by Cherre Robins, PharmD (Pharmacist) on 03/01/21 at 1023  Med List Status: <None>  Medication Order Taking? Sig Documenting Provider Last Dose Status Informant  acamprosate (CAMPRAL) 333 MG tablet [id number] No Take 1 tablet (333 mg total) by mouth in the morning and at bedtime.  Patient not taking: Reported on 03/01/2021   Merian Capron, MD Not Taking Active   albuterol [facility] HFA) 108 [phone] Base) MCG/ACT inhaler [id number] Yes Inhale 2 puffs into the lungs every 6 (six) hours as needed for wheezing or shortness of breath. [name], NP Taking Active   buPROPion [facility]) 100 MG tablet [id number] Yes TAKE 2 TABLETS(200 MG) BY MOUTH TWICE DAILY Merian Capron, MD Taking Active   cholecalciferol (VITAMIN D) 1000 UNITS tablet [id number] Yes Take 1,000 Units by mouth 3 (three) times a week.  [provider] Taking Active   hyoscyamine (LEVBID) 0.375 MG 12 hr tablet [id number] Yes Take 0.375 mg by mouth daily. [provider] Taking Active            Med Note Dorrene German   Tue Dec 21, 2015 10:47 AM) Received from: External Pharmacy  lisinopril (ZESTRIL) 10 MG tablet [id number] Yes Take 1 tablet (10 mg total) by mouth daily. [name], NP Taking Active   lithium carbonate (LITHOBID) 300 MG CR tablet [id number] Yes TAKE 1 TABLET(300 MG) BY MOUTH TWICE DAILY Merian Capron, MD Taking Active   Multiple Vitamin (MULTIVITAMIN) tablet [id number] Yes Take 1 tablet by mouth 3 (three) times a week.  [provider] Taking Active   PARoxetine (PAXIL) 30 MG tablet [id number] Yes ONE A Tyler Aas, MD Taking Active   vitamin B-12 (CYANOCOBALAMIN) 500 MCG tablet [id number] Yes Take 500 mcg by mouth. 2-3 times per week [provider] Taking Active   Zoster Vaccine Adjuvanted [facility]) injection [id number] No Inject 0.22m IM now and again in 2-6 months.  Patient not taking: Reported on 03/01/2021   ODebbrah Alar NP Not Taking Active           Patient Active Problem List   Diagnosis Date Noted  . Depression 04/07/2019  . Plantar fasciitis 04/07/2019  . Alcohol abuse 12/12/2016  . Hyperlipidemia 05/13/2015  . Bariatric surgery status 12/31/2014  . Vitamin B 12 deficiency 12/31/2014  . Preventative health care 11/13/2014  .  Multinodular goiter 08/27/2014  . Vitamin D deficiency 08/13/2014  . Asthma 08/13/2014  . Sciatica 08/13/2014  . Major depressive disorder, recurrent, severe w/o psychotic behavior ([place]) 06/11/2013  . Leiomyoma uteri 06/20/2011  . Hypertension 05/23/2011  . Obesity (BMI 30-39.9) 05/23/2011    Immunization History  Administered Date(s) Administered  . Influenza,inj,Quad PF,6+ Mos 08/12/2014, 09/22/2015, 09/13/2016, 08/07/2017, 10/29/2018, 07/09/2019  . PFIZER(Purple Top)SARS-COV-2 Vaccination 07/06/2020, 07/27/2020  . Pneumococcal Polysaccharide-23 08/29/2011  . Tdap 11/13/2014    Conditions to be addressed/monitored: HTN, Anxiety, Depression, Bipolar Disorder and elevated LFTs; alcohol use disorder; social phobia; h/o gastric bypass in 2003  Care Plan : [place] (Adult)  Updates made by Cherre Robins, PHARMD since 03/01/2021 12:00 AM    Problem: Medication and disease state management: Pre-DM, HTN, Asthma, Depression, Alcohol Use Disorder, Vitamin D Deficiency, Stomach Pain; elevated LFTs    Priority: High  Onset Date: 03/01/2021  Note:   Current Barriers:  . Chronic Disease Management support, education, and care coordination needs related to Pre-DM, HTN, HLD, Asthma, Depression, Alcohol Use Disorder, Vitamin D Deficiency, Stomach Pain; increased LFTs . Not taking Campral  Pharmacist Clinical Goal(s):  Marland Kitchen Over the next 180 days, patient will achieve adherence to monitoring guidelines and medication adherence to achieve therapeutic efficacy . maintain control of Blood pressure as evidenced by BP <140/90  . Start Campral and stop alcohol use  through collaboration with PharmD and provider.   Interventions: . 1:1 collaboration with [name], NP regarding development and update of comprehensive plan of care as evidenced by provider attestation and co-signature . Inter-disciplinary care team collaboration (see longitudinal plan of care) . Comprehensive medication review performed; medication list updated in electronic medical record  Hypertension (BP goal <140/90) BP Readings from Last 3 Encounters:  11/17/20 114/65  08/25/20 (!) 148/65  04/21/20 113/64   . Controlled . Current regimen:  o Lisinopril 79m daily . Interventions: o Discussed blood pressure goal  o Reviewed home blood pressure readings - per patient reports lats BP was 118/85 o Maintain hypertension medication regimen. o Continue to check blood pressure 1 or 2 times per week and record  Cholesterol (LDL <100) Lipid Panel     Component Value Date/Time   CHOL 254 (H) 02/03/2020 1015   TRIG 72.0 02/03/2020 1015   HDL 156.50 02/03/2020 1015   CHOLHDL 2 02/03/2020 1015   VLDL 14.4 02/03/2020 1015   LDLCALC 83 02/03/2020 1015    . At goal with out need for pharmacotherapy . Current regimen:  o None . Interventions: o Discussed cholesterol goals for LDL, HDL and Tg o Maintain LDL <100  Pre-Diabetes(a1c goal <6.5%) Lab Results  Component Value Date/Time   HGBA1C 5.7 (H) 11/20/2011 11:59  AM   . Current regimen:  o Diet and exercise management  . Interventions: o Discussed A1c goal o Recommend checking A1c with next labs   Alcohol Use Disorder . Uncontrolled . Current regimen:  o Campral 3359mtwice daily (patient has not started yet) . Patient continues to drink daily between 6 and 12 beers per day. . States she is not ready to quit yet and is waiting to start Campral until she is ready . Has appt with therapist 03/23/2021 and Dr AkDe Nurse/09/2021 . Interventions: o Encouraged patient to decrease number of drinks per day and to consider starting Camprel o Continue to follow up with Dr AkDe Nursenext appt 03/16/2021) and therapist (next appt 03/23/21) o Contact providers with needed support  Mood / anxiety:  .  Pharmacist Clinical Goal(s): o Over the next 90 days, patient will work with PharmD and providers to improve mood and anxiety . Current treatment: o Lithium 300g twice a day o Paroxetine 28m daily o Bupropion 1053m- take 2 tablets = 20077mwice a day . Interventions o Recommended she take second dose of bupropion before 4pm if she is having trouble sleeping. Sometimes taking bupropion later in day can affect sleep.  o Continue current medication regimen o Continue regular follow up with Dr AkhDe Nursed therapist  Medication management / Health Maintenance:  . Current pharmacy: [facility] . DEXA 04/03/2017 - T-Score at right femur was +0.1 and left forearm +1.6 . Has not received Shingrix yet . Had not received COVID booster yet . Due to get colonoscopy per GI but deferred at last appt. Has f/u in 1 year and GI states will readdress then. . Interventions o Comprehensive medication review performed. Medication list updated.  o Discussed getting Shingrix o Discussed getting COVID booster - patient is planning to get soon o Continue to f/u with GI and get colonoscopy as recommended o Reviewed medication refill history and discussed adherence.   Patient  Goals/Self-Care Activities . Over the next 180 days, patient will:  take medications as prescribed, check blood pressure 1 to 2 times per week, document, and provide at future appointments, target a minimum of 150 minutes of moderate intensity exercise weekly, and consider getting recommended vaccines, labs and procedure discussed above.  Follow Up Plan: Telephone follow up appointment with care management team member scheduled for:  6 months      Medication Assistance: None required.  Patient affirms current coverage meets needs.  [id number] [place] [facility] [address] [id number]: [phone]: [phone] Up:  Patient agrees to Care Plan and Follow-up.  Plan: Telephone follow up appointment with care management team member scheduled for:  6 months  TamCherre RobinsharmD Clinical Pharmacist [facility]

## 2021-03-01 NOTE — Patient Instructions (Addendum)
Visit Information  PATIENT GOALS: Goals Addressed            This Visit's Progress   . Alcohol Use: Patient will decrease/stop alcohol use   Not on track    [place] (see longitudinal plan of care for additional care plan information)  Current Barriers:  . Alcohol use complicated by stress  Pharmacist Clinical Goal(s):  Marland Kitchen Over the next 180 days, patient will work with PharmD and providers to assist patient with reducing or eliminating alcohol use  Interventions: . Comprehensive medication review completed; medication list updated in electronic medical record.  Bertram Savin care team collaboration (see longitudinal plan of care) . Discussed use of Campral (patient has not started yet)   Patient Self Care Activities:  . Over next 180 days patient will continue to follow up with Dr De Nurse and therapist . Consider starting Campral 333mg  twice a day   Initial goal documentation     . Chronic Care Management Pharmacy Care Plan   On track    CARE PLAN ENTRY (see longitudinal plan of care for additional care plan information)  Current Barriers:  . Chronic Disease Management support, education, and care coordination needs related to Pre-DM, HTN, HLD, Asthma, Depression, Alcohol Use Disorder, Vitamin D Deficiency, Stomach Pain   Hypertension BP Readings from Last 3 Encounters:  11/17/20 114/65  08/25/20 (!) 148/65  04/21/20 113/64   . Pharmacist Clinical Goal(s): o Over the next 90 days, patient will work with PharmD and providers to maintain BP goal <140/90 . Current regimen:  o Lisinopril 10mg  daily . Interventions: o Discussed blood pressure goal  o Discussed medication adherence o Reviewed home blood pressure readings . Patient self care activities - Over the next 90 days, patient will: o Maintain hypertension medication regimen. o Continue to check blood pressure 1 or 2 times per week and record  Cholesterol Lipid Panel     Component Value  Date/Time   CHOL 254 (H) 02/03/2020 1015   TRIG 72.0 02/03/2020 1015   HDL 156.50 02/03/2020 1015   CHOLHDL 2 02/03/2020 1015   VLDL 14.4 02/03/2020 1015   LDLCALC 83 02/03/2020 1015    . Pharmacist Clinical Goal(s): o Over the next 90 days, patient will work with PharmD and providers to maintain LDL goal < 100 . Current regimen:  o None . Interventions: o Discussed cholesterol goals for LDL, HDL and Tg . Patient self care activities - Over the next 90 days, patient will: o Maintain LDL <100  Pre-Diabetes Lab Results  Component Value Date/Time   HGBA1C 5.7 (H) 11/20/2011 11:59 AM   . Pharmacist Clinical Goal(s): o Over the next 90 days, patient will work with PharmD and providers to maintain A1c goal <6.5% . Current regimen:  o Diet and exercise management  . Interventions: o Discussed A1c goal o Recommend checking A1c with next labs . Patient self care activities - Over the next 90 days, patient will: o Maintain a1c <6.5%   Alcohol Use Disorder . Pharmacist Clinical Goal(s) o Over the next 90 days, patient will work with PharmD and providers to reduce alcohol consumptoms . Current regimen:  o Camprel 333mg  twice daily (patient has not started yet) . Interventions: o Encouraged patient to decrease number of drinks per day and to consider starting Camprel . Patient self care activities - Over the next 90 days, patient will: o Continue to follow up with Dr De Nurse (next appt 03/16/2021) and therapist (next appt 03/23/21) o Contact providers with  needed support  Mood / anxiety:  . Pharmacist Clinical Goal(s): o Over the next 90 days, patient will work with PharmD and providers to improve mood and anxiety . Current treatment: o Lithium 300g twice a day o Paroxetine 30mg  daily o Bupropion 100mg  - take 2 tablets = 200mg  twice a day . Interventions o Discussed to take second dose of bupropion before 4pm if she is having trouble sleeping and taking bupropion later in day can  affect sleep.  . Patient self care activities - Over the next 90 days, patient will: o Continue current medication regimen o Continue regular follow up with Dr De Nurse and therapist  Medication management . Pharmacist Clinical Goal(s): o Over the next 90 days, patient will work with PharmD and providers to maintain optimal medication adherence . Current pharmacy: [facility] . Interventions o Comprehensive medication review performed. o Continue current medication management strategy . Patient self care activities - Over the next 90 days, patient will: o Focus on medication adherence by filling and taking medications appropriately  o Take medications as prescribed o Report any questions or concerns to PharmD and/or provider(s)  Please see past updates related to this goal by clicking on the "Past Updates" button in the selected goal      . Consider completing a1c lab at next visit on 04/21/20   Not on track   . COMPLETED: Hyperlipidemia: LDL goal <100, Total Cholesterol goal <200       CARE PLAN ENTRY (see longitudinal plan of care for additional care plan information)  Current Barriers:  . Uncontrolled hyperlipidemia, complicated by alcohol use disorder . Current antihyperlipidemic regimen: none . Previous antihyperlipidemic medications tried None noted  . Most recent lipid panel:     Component Value Date/Time   CHOL 254 (H) 02/03/2020 1015   TRIG 72.0 02/03/2020 1015   HDL 156.50 02/03/2020 1015   CHOLHDL 2 02/03/2020 1015   VLDL 14.4 02/03/2020 1015   LDLCALC 83 02/03/2020 1015   . 10-year ASCVD risk score: The ASCVD Risk score [name], et al., 2013) failed to calculate for the following reasons: .   The valid HDL cholesterol range is 20 to 100 mg/dL .   Pharmacist Clinical Goal(s):  Marland Kitchen Over the next 180 days, patient will work with PharmD and providers towards optimized antihyperlipidemic therapy  Interventions: . Comprehensive medication review performed;  medication list updated in electronic medical record.  Bertram Savin care team collaboration (see longitudinal plan of care) . Medication recommendation to assist with alcohol use disorder . Discussed diet (limiting carbohydrates) and exercise (150 minutes per week)  Patient Self Care Activities:  . Over the next 180 days patient will focus on discussing medication options with Dr.Akhtar for alcohol use disorder . Over the next 180 days patient will focus on increasing exercise with goal of 150 minutes per week . Over the next 180 days patient will focus on limiting carbohydrates in her diet (30-45 grams per meal)  Initial goal documentation     . Vaccines: Patient to get COVID and Shingrix vaccines   Not on track    [place] (see longitudinal plan of care for additional care plan information)  Current Barriers:  . Need for vaccination for the following: . Shingles (Shingrix vaccine) . COVID (COVID Vaccine)  Pharmacist Clinical Goal(s):  Marland Kitchen Over the next 180 days, patient will work with PharmD and providers to coordinate care for patient to receive the recommended vaccines  Interventions: . Comprehensive medication review completed;  medication list updated in electronic medical record.  Bertram Savin care team collaboration (see longitudinal plan of care) . Recommend patient receive Shingrix vaccine at pharmacy . Recommend patient receive COVID vaccine where available  Patient Self Care Activities:  . Over the next 180 days patient will complete COVID vaccine series . Over the next 180 days patient will complete Shingrix vaccine series   Initial goal documentation        The patient verbalized understanding of instructions, educational materials, and care plan provided today and declined offer to receive copy of patient instructions, educational materials, and care plan.   Telephone follow up appointment with care management team member scheduled for: 6  months  Cherre Robins, PharmD Clinical Pharmacist [facility] [phone]

## 2021-03-06 ENCOUNTER — Other Ambulatory Visit (HOSPITAL_COMMUNITY): Payer: Self-pay | Admitting: Psychiatry

## 2021-03-11 DIAGNOSIS — H2513 Age-related nuclear cataract, bilateral: Secondary | ICD-10-CM | POA: Diagnosis not present

## 2021-03-16 ENCOUNTER — Encounter (HOSPITAL_COMMUNITY): Payer: Self-pay | Admitting: Psychiatry

## 2021-03-16 ENCOUNTER — Telehealth (INDEPENDENT_AMBULATORY_CARE_PROVIDER_SITE_OTHER): Payer: Medicare Other | Admitting: Psychiatry

## 2021-03-16 DIAGNOSIS — F3130 Bipolar disorder, current episode depressed, mild or moderate severity, unspecified: Secondary | ICD-10-CM

## 2021-03-16 DIAGNOSIS — F4010 Social phobia, unspecified: Secondary | ICD-10-CM | POA: Diagnosis not present

## 2021-03-16 DIAGNOSIS — F1020 Alcohol dependence, uncomplicated: Secondary | ICD-10-CM

## 2021-03-16 NOTE — Progress Notes (Signed)
Patient ID: Julie Powell, female   DOB: 1961-03-18, 60 y.o.   MRN: [id number] [place] Follow-up Outpatient Visit  Julie Powell [birth year]  Virtual Visit via Telephone Note  I connected with Julie Powell on 03/16/21 at  1:30 PM EDT by telephone and verified that I am speaking with the correct person using two identifiers.  Location: Patient: home Provider: home office   I discussed the limitations, risks, security and privacy concerns of performing an evaluation and management service by telephone and the availability of in person appointments. I also discussed with the patient that there may be a patient responsible charge related to this service. The patient expressed understanding and agreed to proceed.       I discussed the assessment and treatment plan with the patient. The patient was provided an opportunity to ask questions and all were answered. The patient agreed with the plan and demonstrated an understanding of the instructions.   The patient was advised to call back or seek an in-person evaluation if the symptoms worsen or if the condition fails to improve as anticipated.  I provided  15 minutes of non-face-to-face time during this encounter       CC  depression follow up  HPI Comments: [name] is a 60   Y/O woman with a past psychiatric history Major Depressive Disorder, severe, recurrent. Mood disorder due to [facility], Alcohol use disorder and Social Phobia.     Last visit we reviewed options how to abstain from alcohol Started on Campral that is helped she is sober for the last 3 weeks she feels more energy less depressed trying to add activities during the daytime she is associating negative things along with alcohol to keep away from it that and she has scheduled therapy with Julie Powell next week  Depression is somewhat better  Aggravating factor: Loneliness Modifying factor: housing son  . Duration: since young age . Timing: more so in evening   . Context: alcohol use history\  . Associated signs and symptoms: Denies psychosis   Traumatic Brain Injury: No none   Review of Systems  Cardiovascular: Negative for chest pain.  Skin: Negative for itching.  Neurological: Negative for headaches.  Psychiatric/Behavioral: Positive for substance abuse. Negative for suicidal ideas.   There were no vitals filed for this visit. Physical Exam  Constitutional: She appears well-developed and well-nourished. No distress.  Skin: She is not diaphoretic.  No tremors of hands noted while patient was interviewed.     Past Medical History: Reviewed  Past Medical History:  Diagnosis Date  . Anxiety   . Asthma   . B12 deficiency   . Bipolar 1 disorder ([place]) 2010   ect  . Fibroids   . Hypertension   . Plantar fasciitis 2013  . Sciatica    RT hip  . Vitamin D deficiency     Allergies: No Known Allergies   Current Medications: Reviewed   Current Outpatient Medications on File Prior to Visit  Medication Sig Dispense Refill  . acamprosate (CAMPRAL) 333 MG tablet Take 1 tablet (333 mg total) by mouth in the morning and at bedtime. (Patient not taking: Reported on 03/01/2021) 60 tablet 1  . albuterol (PROAIR HFA) 108 (90 Base) MCG/ACT inhaler Inhale 2 puffs into the lungs every 6 (six) hours as needed for wheezing or shortness of breath. 20.1 g 1  . buPROPion (WELLBUTRIN) 100 MG tablet TAKE 2 TABLETS(200 MG) BY MOUTH TWICE DAILY 360 tablet 0  . cholecalciferol (VITAMIN D) 1000  UNITS tablet Take 1,000 Units by mouth 3 (three) times a week.     . hyoscyamine (LEVBID) 0.375 MG 12 hr tablet Take 0.375 mg by mouth daily.  0  . lisinopril (ZESTRIL) 10 MG tablet Take 1 tablet (10 mg total) by mouth daily. 90 tablet 1  . lithium carbonate (LITHOBID) 300 MG CR tablet TAKE 1 TABLET(300 MG) BY MOUTH TWICE DAILY 180 tablet 0  . Multiple Vitamin (MULTIVITAMIN) tablet Take 1 tablet by mouth 3 (three) times a week.     Marland Kitchen PARoxetine (PAXIL) 30 MG tablet  ONE A DAY 90 tablet 0  . vitamin B-12 (CYANOCOBALAMIN) 500 MCG tablet Take 500 mcg by mouth. 2-3 times per week    . Zoster Vaccine Adjuvanted [facility]) injection Inject 0.5mg  IM now and again in 2-6 months. (Patient not taking: Reported on 03/01/2021) 0.5 mL 1   No current facility-administered medications on file prior to visit.      Family History: Reviewed  Family History   Problem  Relation  Age of Onset   .  Adopted: Yes   .  Aneurysm  Father     Psychiatric Specialty Exam:  Objective: Appearance:   Eye Contact::   Speech: Clear and Coherent and Normal Rate   Volume: Normal   Mood fair  Affect: congruent  Thought Process: Goal Directed, Logical and Loose   Orientation: Full   Thought Content: WDL   Suicidal Thoughts: Patient denies   Homicidal Thoughts: Patient denies   Judgement: Fair   Insight: Fair   Psychomotor Activity: Normal   Akathisia: No   Handed: Right   Memory: Immediate 3/3, Recent: 1/3   [place] of knowledge-average to above average.  AIMS (if indicated): Not indicated at this time.   Assets: Communication Skills  Desire for Improvement    Assessment:  AXIS I: Major Depressive Disorder, ; GAD: insomnia: Alcohol use disorder Treatment Plan/Recommendations:   Plan of Care:  Reviewed prior documentation      .   Medications:   Bipolar depression: Depression is better continue lithium remains sober for the last 3 weeks and continues to remain positive has some withdrawal symptoms but she was able to manage it   understands to get blood work , says feels difficult getting out of home Anxiety: Improved continue Paxil she is scheduled for therapy next week  alcohol use: Discussed sobriety relapse prevention continue Campral 2 times a day  Fu 6 weeks or earlier. .    refills sent due were sent  Will see in office next visit          Julie Powell, M.D.  03/16/2021 1:43 PM

## 2021-03-17 DIAGNOSIS — Z23 Encounter for immunization: Secondary | ICD-10-CM | POA: Diagnosis not present

## 2021-03-23 ENCOUNTER — Ambulatory Visit (INDEPENDENT_AMBULATORY_CARE_PROVIDER_SITE_OTHER): Payer: Medicare Other | Admitting: Licensed Clinical Social Worker

## 2021-03-23 DIAGNOSIS — F1020 Alcohol dependence, uncomplicated: Secondary | ICD-10-CM

## 2021-03-23 DIAGNOSIS — F3130 Bipolar disorder, current episode depressed, mild or moderate severity, unspecified: Secondary | ICD-10-CM | POA: Diagnosis not present

## 2021-03-23 DIAGNOSIS — F4010 Social phobia, unspecified: Secondary | ICD-10-CM
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
## 2021-04-07 ENCOUNTER — Telehealth: Payer: Self-pay | Admitting: Pharmacist

## 2021-04-07 NOTE — Chronic Care Management (AMB) (Signed)
    Chronic Care Management Pharmacy Assistant   Name: Julie Powell  MRN: [id number] DOB: Mar 27, 1961   Reason for Encounter: Disease State General    Recent office visits:  None noted  Recent consult visits:  03/16/21 Merian Capron, MD (Behavioral health)  Hospital visits:  Medication Reconciliation was completed by comparing discharge summary, patient's EMR and Pharmacy list, and upon discussion with patient.  Admitted to the hospital on 11/17/2020 due to Orthostasis. Discharge date was 11/17/20. Discharged from [facility]?Medications Started at [facility] Discharge:?? -started none  Medication Changes at Hospital Discharge: -Changed none  Medications Discontinued at Hospital Discharge: -Stopped none  Medications that remain the same after Hospital Discharge:??  -All other medications will remain the same.    Medications: Outpatient Encounter Medications as of 04/07/2021  Medication Sig Note  . acamprosate (CAMPRAL) 333 MG tablet Take 1 tablet (333 mg total) by mouth in the morning and at bedtime. (Patient not taking: Reported on 03/01/2021)   . albuterol (PROAIR HFA) 108 (90 Base) MCG/ACT inhaler Inhale 2 puffs into the lungs every 6 (six) hours as needed for wheezing or shortness of breath.   Marland Kitchen buPROPion (WELLBUTRIN) 100 MG tablet TAKE 2 TABLETS(200 MG) BY MOUTH TWICE DAILY   . cholecalciferol (VITAMIN D) 1000 UNITS tablet Take 1,000 Units by mouth 3 (three) times a week.    . hyoscyamine (LEVBID) 0.375 MG 12 hr tablet Take 0.375 mg by mouth daily. 12/21/2015: Received from: External Pharmacy  . lisinopril (ZESTRIL) 10 MG tablet Take 1 tablet (10 mg total) by mouth daily.   Marland Kitchen lithium carbonate (LITHOBID) 300 MG CR tablet TAKE 1 TABLET(300 MG) BY MOUTH TWICE DAILY   . Multiple Vitamin (MULTIVITAMIN) tablet Take 1 tablet by mouth 3 (three) times a week.    Marland Kitchen PARoxetine (PAXIL) 30 MG tablet ONE A DAY   . vitamin B-12 (CYANOCOBALAMIN) 500 MCG tablet  Take 500 mcg by mouth. 2-3 times per week   . Zoster Vaccine Adjuvanted [facility]) injection Inject 0.5mg  IM now and again in 2-6 months. (Patient not taking: Reported on 03/01/2021)    No facility-administered encounter medications on file as of 04/07/2021.   Have you had any problems recently with your health? Patient states she is not having any problems with her health.  Have you had any problems with your pharmacy? Patient states she is not having any problems with her pharmacy.  What issues or side effects are you having with your medications? Patient states she is not having any side effects or issues with her medications.  What would you like me to pass along to [facility] for them to help you with?  Patient states she has recently been struggling with alcohol consumption but has stopped.   What can we do to take care of you better? Patient states there is nothing at this time.  Star Rating Drugs: Lisinopril 10 mg last filled 03/07/21 90 DS  [facility] Clinical Pharmacist Assistant [phone]

## 2021-04-15 NOTE — Progress Notes (Signed)
Subjective:   Julie Powell is a 60 y.o. female who presents for Medicare Annual (Subsequent) preventive examination.  I connected with Julie Powell today by telephone and verified that I am speaking with the correct person using two identifiers. Location patient: home Location provider: work Persons participating in the virtual visit: patient, Marine scientist.    I discussed the limitations, risks, security and privacy concerns of performing an evaluation and management service by telephone and the availability of in person appointments. I also discussed with the patient that there may be a patient responsible charge related to this service. The patient expressed understanding and verbally consented to this telephonic visit.    Interactive audio and video telecommunications were attempted between this provider and patient, however failed, due to patient having technical difficulties OR patient did not have access to video capability.  We continued and completed visit with audio only.  Some vital signs may be absent or patient reported.   Time Spent with patient on telephone encounter: 20 minutes   Review of Systems     Cardiac Risk Factors include: hypertension;dyslipidemia;obesity (BMI >30kg/m2)     Objective:    Today's Vitals   04/18/21 1338 04/18/21 1339  Weight: 277 lb (125.6 kg)   Height: 5\' 4"  (1.626 m)   PainSc:  7    Body mass index is 47.55 kg/m.  Advanced Directives 04/18/2021 11/17/2020 11/27/2019 04/07/2019 11/25/2018 11/22/2017 10/17/2016  Does Patient Have a Medical Advance Directive? No No No No No No No  Does patient want to make changes to medical advance directive? - - - - - - No - Patient declined  Would patient like information on creating a medical advance directive? No - Patient declined - No - Patient declined - No - Patient declined No - Patient declined -    Current Medications (verified) Outpatient Encounter Medications as of 04/18/2021  Medication Sig   albuterol  (PROAIR HFA) 108 (90 Base) MCG/ACT inhaler Inhale 2 puffs into the lungs every 6 (six) hours as needed for wheezing or shortness of breath.   buPROPion (WELLBUTRIN) 100 MG tablet TAKE 2 TABLETS(200 MG) BY MOUTH TWICE DAILY   cholecalciferol (VITAMIN D) 1000 UNITS tablet Take 1,000 Units by mouth 3 (three) times a week.    hyoscyamine (LEVBID) 0.375 MG 12 hr tablet Take 0.375 mg by mouth daily.   lisinopril (ZESTRIL) 10 MG tablet Take 1 tablet (10 mg total) by mouth daily.   lithium carbonate (LITHOBID) 300 MG CR tablet TAKE 1 TABLET(300 MG) BY MOUTH TWICE DAILY   Multiple Vitamin (MULTIVITAMIN) tablet Take 1 tablet by mouth 3 (three) times a week.    PARoxetine (PAXIL) 30 MG tablet ONE A DAY   vitamin B-12 (CYANOCOBALAMIN) 500 MCG tablet Take 500 mcg by mouth. 2-3 times per week   acamprosate (CAMPRAL) 333 MG tablet Take 1 tablet (333 mg total) by mouth in the morning and at bedtime. (Patient not taking: No sig reported)   Zoster Vaccine Adjuvanted [facility]) injection Inject 0.5mg  IM now and again in 2-6 months. (Patient not taking: No sig reported)   No facility-administered encounter medications on file as of 04/18/2021.    Allergies (verified) Patient has no known allergies.   History: Past Medical History:  Diagnosis Date   Anxiety    Asthma    B12 deficiency    Bipolar 1 disorder ([place]) 2010   ect   Fibroids    Hypertension    Plantar fasciitis 2013   Sciatica    RT  hip   Vitamin D deficiency    Past Surgical History:  Procedure Laterality Date   [place]   "thyroglycol duct cyst in neck" had dysphagia, was told benign   GASTRIC BYPASS  2003   MICRODISCECTOMY LUMBAR  09/11/14   L3-L4  Dr Saintclair Halsted   PILONIDAL CYST DRAINAGE  1982   REDUCTION MAMMAPLASTY     SP CHOLECYSTOMY  1986   Family History  Adopted: Yes  Problem Relation Age of Onset   Aneurysm Father    Alcohol abuse Father    Alcohol abuse Brother    Alcohol abuse Brother     Cancer Brother        prostate   Alcohol abuse Brother    Lung cancer Sister    Cancer Sister        lung   Thyroid disease Neg Hx    Diabetes Neg Hx    Hypertension Neg Hx    Social History   Socioeconomic History   Marital status: Divorced    Spouse name: Not on file   Number of children: 1   Years of education: Not on file   Highest education level: Bachelor's degree (e.g., BA, AB, BS)  Occupational History   Not on file  Tobacco Use   Smoking status: Former    Packs/day: 0.50    Years: 10.00    Pack years: 5.00    Types: Cigarettes    Quit date: 11/07/1991    Years since quitting: 29.4   Smokeless tobacco: Never  Vaping Use   Vaping Use: Never used  Substance and Sexual Activity   Alcohol use: Not Currently   Drug use: Not Currently    Types: "Crack" cocaine    Comment: Caffeine- Carbonated beverage 16 ounces.   Sexual activity: Never    Birth control/protection: Post-menopausal  Other Topics Concern   Not on file  Social History Narrative   Lives with 74 year old son   She is on disability due to depression   In the past she has worked as a Education officer, community for Systems developer   Former Mudlogger   Completed bachelor's degree   Divorced.   Enjoys television   Right handed    Apartment 1st floor    Social Determinants of Health   Financial Resource Strain: Low Risk    Difficulty of Paying Living Expenses: Not very hard  Food Insecurity: No Food Insecurity   Worried About Charity fundraiser in the Last Year: Never true   Ran Out of Food in the Last Year: Never true  Transportation Needs: No Transportation Needs   Lack of Transportation (Medical): No   Lack of Transportation (Non-Medical): No  Physical Activity: Inactive   Days of Exercise per Week: 0 days   Minutes of Exercise per Session: 0 min  Stress: No Stress Concern Present   Feeling of Stress : Not at all  Social Connections: Socially  Isolated   Frequency of Communication with Friends and Family: More than three times a week   Frequency of Social Gatherings with Friends and Family: Once a week   Attends Religious Services: Never   Marine scientist or Organizations: No   Attends Music therapist: Never   Marital Status: Divorced    Tobacco Counseling Counseling given: Not Answered   Clinical Intake:  Pre-visit preparation completed: Yes  Pain : 0-10 Pain Score: 7  Pain Type: Chronic pain Pain Location: Back Pain Onset: More than a month ago Pain Frequency: Constant     Nutritional Status: BMI > 30  Obese Nutritional Risks: None Diabetes: No  How often do you need to have someone help you when you read instructions, pamphlets, or other written materials from your doctor or pharmacy?: 1 - Never  Diabetic?No  Interpreter Needed?: No  Information entered by :: Caroleen Hamman LPN   Activities of Daily Living In your present state of health, do you have any difficulty performing the following activities: 04/18/2021  Hearing? N  Vision? N  Difficulty concentrating or making decisions? Y  Walking or climbing stairs? N  Dressing or bathing? N  Doing errands, shopping? N  Preparing Food and eating ? N  Using the Toilet? N  In the past six months, have you accidently leaked urine? N  Do you have problems with loss of bowel control? N  Managing your Medications? N  Managing your Finances? N  Housekeeping or managing your Housekeeping? N  Some recent data might be hidden    Patient Care Team: [name], NP as PCP - General (Internal Medicine) Merian Capron, MD as Consulting Physician (Psychiatry) Renato Shin, MD as Consulting Physician (Endocrinology) Lucienne Capers, MD as Consulting Physician (Gastroenterology) Cameron Sprang, MD as Consulting Physician (Neurology) Cherre Robins, PharmD (Pharmacist)  Indicate any recent Medical Services you may have received  from other than [facility] providers in the past year (date may be approximate).     Assessment:   This is a routine wellness examination for Julie Powell.  Hearing/Vision screen Hearing Screening - Comments:: No issues Vision Screening - Comments:: Wears glasses [facility] Allean Found  Dietary issues and exercise activities discussed: Current Exercise Habits: The patient does not participate in regular exercise at present, Exercise limited by: None identified   Goals Addressed               This Visit's Progress     Patient Stated     Stop drinking so much (pt-stated)   On track      Depression Screen PHQ 2/9 Scores 04/18/2021 11/30/2020 08/25/2020 04/21/2020 11/27/2019 11/25/2018 11/25/2018  PHQ - 2 Score 1 4 5 5 1 4 6   PHQ- 9 Score - 15 13 16  - 16 18  Some encounter information is confidential and restricted. Go to Review Flowsheets activity to see all data.    Fall Risk Fall Risk  04/18/2021 11/27/2019 04/07/2019 11/25/2018 11/22/2017  Falls in the past year? 1 0 1 1 No  Number falls in past yr: 1 0 1 1 -  Injury with Fall? 0 0 1 - -  Risk for fall due to : History of fall(s) - - Impaired balance/gait -  Follow up Falls prevention discussed - - - -    FALL RISK PREVENTION PERTAINING TO THE HOME:  Any stairs in or around the home? No  Home free of loose throw rugs in walkways, pet beds, electrical cords, etc? Yes  Adequate lighting in your home to reduce risk of falls? Yes   ASSISTIVE DEVICES UTILIZED TO PREVENT FALLS:  Life alert? No  Use of a cane, walker or w/c? No  Grab bars in the bathroom? No  Shower chair or bench in shower? No  Elevated toilet seat or a handicapped toilet? No   TIMED UP AND GO:  Was the test performed? No . Phone visit   Cognitive Function: Normal cognitive status assessed by  this Nurse Health Advisor. No abnormalities found.   MMSE - Mini Mental State Exam 11/22/2017 10/17/2016  Orientation to time 5 5  Orientation to Place  5 5  Registration 3 3  Attention/ Calculation 5 5  Recall 3 2  Language- name 2 objects 2 2  Language- repeat 1 1  Language- follow 3 step command 3 3  Language- read & follow direction 1 1  Write a sentence 1 1  Copy design 1 1  Total score 30 29        Immunizations Immunization History  Administered Date(s) Administered   Influenza,inj,Quad PF,6+ Mos 08/12/2014, 09/22/2015, 09/13/2016, 08/07/2017, 10/29/2018, 07/09/2019   PFIZER(Purple Top)SARS-COV-2 Vaccination 07/06/2020, 07/27/2020, 03/17/2021   Pneumococcal Polysaccharide-23 08/29/2011   Tdap 11/13/2014    TDAP status: Up to date  Flu Vaccine status: Up to date  Pneumococcal vaccine status: Up to date  Covid-19 vaccine status: Completed vaccines  Qualifies for Shingles Vaccine? Yes   Zostavax completed No   Shingrix Completed?: No.    Education has been provided regarding the importance of this vaccine. Patient has been advised to call insurance company to determine out of pocket expense if they have not yet received this vaccine. Advised may also receive vaccine at local pharmacy or Health Dept. Verbalized acceptance and understanding.  Screening Tests Health Maintenance  Topic Date Due   Pneumococcal Vaccine 19-96 Years old (1 - PCV) Never done   Zoster Vaccines- Shingrix (1 of 2) Never done   DEXA SCAN  04/04/2019   INFLUENZA VACCINE  06/06/2021   COVID-19 Vaccine (4 - Booster for Pfizer series) 07/18/2021   PAP SMEAR-Modifier  07/08/2022   MAMMOGRAM  08/31/2022   COLONOSCOPY (Pts 45-4yrs Insurance coverage will need to be confirmed)  11/08/2023   TETANUS/TDAP  11/13/2024   Hepatitis C Screening  Completed   HIV Screening  Completed   HPV VACCINES  Aged Out    Health Maintenance  Health Maintenance Due  Topic Date Due   Pneumococcal Vaccine 57-39 Years old (1 - PCV) Never done   Zoster Vaccines- Shingrix (1 of 2) Never done   DEXA SCAN  04/04/2019    Colorectal cancer screening: Type of  screening: Colonoscopy. Completed 11/07/2013. Repeat every 10 years  Mammogram status: Completed 08/31/2020-Bilateral. Repeat every year  Bone Density status: Not yet indicated  Lung Cancer Screening: (Low Dose CT Chest recommended if Age 33-80 years, 30 pack-year currently smoking OR have quit w/in 15years.) does not qualify.    Additional Screening:  Hepatitis C Screening: Completed 08/09/2017  Vision Screening: Recommended annual ophthalmology exams for early detection of glaucoma and other disorders of the eye. Is the patient up to date with their annual eye exam?  Yes  Who is the provider or what is the name of the office in which the patient attends annual eye exams? Dr. Allean Found   Dental Screening: Recommended annual dental exams for proper oral hygiene  Community Resource Referral / Chronic Care Management: CRR required this visit?  No   CCM required this visit?  No      Plan:     I have personally reviewed and noted the following in the patient's chart:   Medical and social history Use of alcohol, tobacco or illicit drugs  Current medications and supplements including opioid prescriptions.  Functional ability and status Nutritional status Physical activity Advanced directives List of other physicians Hospitalizations, surgeries, and ER visits in previous 12 months Vitals Screenings to include cognitive, depression, and falls Referrals  and appointments  In addition, I have reviewed and discussed with patient certain preventive protocols, quality metrics, and best practice recommendations. A written personalized care plan for preventive services as well as general preventive health recommendations were provided to patient.   Due to this being a telephonic visit, the after visit summary with patients personalized plan was offered to patient via mail or my-chart. Patient would like to access on my-chart.   [name], LPN   03/01/8340  Nurse Health  Advisor  Nurse Notes: None

## 2021-04-18 ENCOUNTER — Ambulatory Visit (INDEPENDENT_AMBULATORY_CARE_PROVIDER_SITE_OTHER): Payer: Medicare Other

## 2021-04-18 VITALS — Ht 64.0 in | Wt 277.0 lb

## 2021-04-18 DIAGNOSIS — Z0000 Encounter for general adult medical examination without abnormal findings: Secondary | ICD-10-CM

## 2021-04-18 NOTE — Patient Instructions (Signed)
Julie Powell , Thank you for taking time to complete your Medicare Wellness Visit. I appreciate your ongoing commitment to your health goals. Please review the following plan we discussed and let me know if I can assist you in the future.   Screening recommendations/referrals: Colonoscopy: Per our conversation, you will call GI for appt. Mammogram: Completed 08/31/2020-Due 08/31/2021 Bone Density: Not yet indicated. Due at age 60. Recommended yearly ophthalmology/optometry visit for glaucoma screening and checkup Recommended yearly dental visit for hygiene and checkup  Vaccinations: Influenza vaccine: Due 07/2021 Pneumococcal vaccine: Up to date Tdap vaccine: Up to date-Due-11/13/2024 Shingles vaccine: Discuss with pharmacy  Covid-19: Up to date  Advanced directives: Declined information today  Conditions/risks identified: See problem list  Next appointment: Follow up in one year for your annual wellness visit. 04/20/2022 @ 1:40.  Preventive Care 40-64 Years, Female Preventive care refers to lifestyle choices and visits with your health care provider that can promote health and wellness. What does preventive care include? A yearly physical exam. This is also called an annual well check. Dental exams once or twice a year. Routine eye exams. Ask your health care provider how often you should have your eyes checked. Personal lifestyle choices, including: Daily care of your teeth and gums. Regular physical activity. Eating a healthy diet. Avoiding tobacco and drug use. Limiting alcohol use. Practicing safe sex. Taking low-dose aspirin daily starting at age 32. Taking vitamin and mineral supplements as recommended by your health care provider. What happens during an annual well check? The services and screenings done by your health care provider during your annual well check will depend on your age, overall health, lifestyle risk factors, and family history of disease. Counseling  Your  health care provider may ask you questions about your: Alcohol use. Tobacco use. Drug use. Emotional well-being. Home and relationship well-being. Sexual activity. Eating habits. Work and work Statistician. Method of birth control. Menstrual cycle. Pregnancy history. Screening  You may have the following tests or measurements: Height, weight, and BMI. Blood pressure. Lipid and cholesterol levels. These may be checked every 5 years, or more frequently if you are over 81 years old. Skin check. Lung cancer screening. You may have this screening every year starting at age 12 if you have a 30-pack-year history of smoking and currently smoke or have quit within the past 15 years. Fecal occult blood test (FOBT) of the stool. You may have this test every year starting at age 58. Flexible sigmoidoscopy or colonoscopy. You may have a sigmoidoscopy every 5 years or a colonoscopy every 10 years starting at age 3. Hepatitis C blood test. Hepatitis B blood test. Sexually transmitted disease (STD) testing. Diabetes screening. This is done by checking your blood sugar (glucose) after you have not eaten for a while (fasting). You may have this done every 1-3 years. Mammogram. This may be done every 1-2 years. Talk to your health care provider about when you should start having regular mammograms. This may depend on whether you have a family history of breast cancer. BRCA-related cancer screening. This may be done if you have a family history of breast, ovarian, tubal, or peritoneal cancers. Pelvic exam and Pap test. This may be done every 3 years starting at age 30. Starting at age 71, this may be done every 5 years if you have a Pap test in combination with an HPV test. Bone density scan. This is done to screen for osteoporosis. You may have this scan if you are at high  risk for osteoporosis. Discuss your test results, treatment options, and if necessary, the need for more tests with your health care  provider. Vaccines  Your health care provider may recommend certain vaccines, such as: Influenza vaccine. This is recommended every year. Tetanus, diphtheria, and acellular pertussis (Tdap, Td) vaccine. You may need a Td booster every 10 years. Zoster vaccine. You may need this after age 30. Pneumococcal 13-valent conjugate (PCV13) vaccine. You may need this if you have certain conditions and were not previously vaccinated. Pneumococcal polysaccharide (PPSV23) vaccine. You may need one or two doses if you smoke cigarettes or if you have certain conditions. Talk to your health care provider about which screenings and vaccines you need and how often you need them. This information is not intended to replace advice given to you by your health care provider. Make sure you discuss any questions you have with your health care provider. Document Released: 11/19/2015 Document Revised: 07/12/2016 Document Reviewed: 08/24/2015 Elsevier Interactive Patient Education  2017 [place] Prevention in the Home Falls can cause injuries. They can happen to people of all ages. There are many things you can do to make your home safe and to help prevent falls. What can I do on the outside of my home? Regularly fix the edges of walkways and driveways and fix any cracks. Remove anything that might make you trip as you walk through a door, such as a raised step or threshold. Trim any bushes or trees on the path to your home. Use bright outdoor lighting. Clear any walking paths of anything that might make someone trip, such as rocks or tools. Regularly check to see if handrails are loose or broken. Make sure that both sides of any steps have handrails. Any raised decks and porches should have guardrails on the edges. Have any leaves, snow, or ice cleared regularly. Use sand or salt on walking paths during winter. Clean up any spills in your garage right away. This includes oil or grease spills. What  can I do in the bathroom? Use night lights. Install grab bars by the toilet and in the tub and shower. Do not use towel bars as grab bars. Use non-skid mats or decals in the tub or shower. If you need to sit down in the shower, use a plastic, non-slip stool. Keep the floor dry. Clean up any water that spills on the floor as soon as it happens. Remove soap buildup in the tub or shower regularly. Attach bath mats securely with double-sided non-slip rug tape. Do not have throw rugs and other things on the floor that can make you trip. What can I do in the bedroom? Use night lights. Make sure that you have a light by your bed that is easy to reach. Do not use any sheets or blankets that are too big for your bed. They should not hang down onto the floor. Have a firm chair that has side arms. You can use this for support while you get dressed. Do not have throw rugs and other things on the floor that can make you trip. What can I do in the kitchen? Clean up any spills right away. Avoid walking on wet floors. Keep items that you use a lot in easy-to-reach places. If you need to reach something above you, use a strong step stool that has a grab bar. Keep electrical cords out of the way. Do not use floor polish or wax that makes floors slippery. If you  must use wax, use non-skid floor wax. Do not have throw rugs and other things on the floor that can make you trip. What can I do with my stairs? Do not leave any items on the stairs. Make sure that there are handrails on both sides of the stairs and use them. Fix handrails that are broken or loose. Make sure that handrails are as long as the stairways. Check any carpeting to make sure that it is firmly attached to the stairs. Fix any carpet that is loose or worn. Avoid having throw rugs at the top or bottom of the stairs. If you do have throw rugs, attach them to the floor with carpet tape. Make sure that you have a light switch at the top of the  stairs and the bottom of the stairs. If you do not have them, ask someone to add them for you. What else can I do to help prevent falls? Wear shoes that: Do not have high heels. Have rubber bottoms. Are comfortable and fit you well. Are closed at the toe. Do not wear sandals. If you use a stepladder: Make sure that it is fully opened. Do not climb a closed stepladder. Make sure that both sides of the stepladder are locked into place. Ask someone to hold it for you, if possible. Clearly mark and make sure that you can see: Any grab bars or handrails. First and last steps. Where the edge of each step is. Use tools that help you move around (mobility aids) if they are needed. These include: Canes. Walkers. Scooters. Crutches. Turn on the lights when you go into a dark area. Replace any light bulbs as soon as they burn out. Set up your furniture so you have a clear path. Avoid moving your furniture around. If any of your floors are uneven, fix them. If there are any pets around you, be aware of where they are. Review your medicines with your doctor. Some medicines can make you feel dizzy. This can increase your chance of falling. Ask your doctor what other things that you can do to help prevent falls. This information is not intended to replace advice given to you by your health care provider. Make sure you discuss any questions you have with your health care provider. Document Released: 08/19/2009 Document Revised: 03/30/2016 Document Reviewed: 11/27/2014 Elsevier Interactive Patient Education  2017 [employer].

## 2021-04-20 ENCOUNTER — Ambulatory Visit: Payer: Medicare Other | Admitting: Family

## 2021-04-21 ENCOUNTER — Other Ambulatory Visit (HOSPITAL_COMMUNITY): Payer: Self-pay

## 2021-04-21 MED ORDER — ACAMPROSATE CALCIUM 333 MG PO TBEC: 333.0000 mg | DELAYED_RELEASE_TABLET | Freq: Two times a day (BID) | ORAL | 1 refills | Status: DC

## 2021-04-27 ENCOUNTER — Ambulatory Visit (INDEPENDENT_AMBULATORY_CARE_PROVIDER_SITE_OTHER): Payer: Medicare Other | Admitting: Licensed Clinical Social Worker

## 2021-04-27 DIAGNOSIS — F332 Major depressive disorder, recurrent severe without psychotic features: Secondary | ICD-10-CM

## 2021-04-27 DIAGNOSIS — F3130 Bipolar disorder, current episode depressed, mild or moderate severity, unspecified: Secondary | ICD-10-CM

## 2021-04-27 DIAGNOSIS — F1020 Alcohol dependence, uncomplicated: Secondary | ICD-10-CM | POA: Diagnosis not present

## 2021-04-27 DIAGNOSIS — F4010 Social phobia, unspecified: Secondary | ICD-10-CM

## 2021-04-27 NOTE — Progress Notes (Signed)
Virtual Visit via Telephone Note  I connected with Julie Powell on 04/27/21 at  3:00 PM EDT by telephone and verified that I am speaking with the correct person using two identifiers.  Location: Patient: home Provider: home office   I discussed the limitations, risks, security and privacy concerns of performing an evaluation and management service by telephone and the availability of in person appointments. I also discussed with the patient that there may be a patient responsible charge related to this service. The patient expressed understanding and agreed to proceed.   I discussed the assessment and treatment plan with the patient. The patient was provided an opportunity to ask questions and all were answered. The patient agreed with the plan and demonstrated an understanding of the instructions.   The patient was advised to call back or seek an in-person evaluation if the symptoms worsen or if the condition fails to improve as anticipated.  I provided 53 minutes of non-face-to-face time during this encounter.  THERAPIST PROGRESS NOTE  Session Time: 3:00 PM to 3:53 PM  Participation Level: Active  Behavioral Response: CasualAlertAnxious  Type of Therapy: Individual Therapy  Treatment Goals addressed:  Continued sobriety, social anxiety, reestablished all relationships with family and friends, work on health goals work on being less self-conscious about things Interventions: CBT, Solution Focused, Strength-based, Supportive, and Other: coping  Summary: Julie Powell is a 60 y.o. female who presents with doing alright nothing new to report and steadily go along. Not drinking hold on pretty good with that. Haven't gotten out with the house but that is ok. She understands that her progress with drinking "can get that right can get other stuff too". Hold on for that for awhile and once stable can focus on other things. Years and years of therapy stopped doing it when came down here.   Therapist guided patient working on goal of who cares with people think and patient was able to bring up "by Dr. Abbe Powell that what other what people think about you are none of your business.  Notified misperception of dangerousness patient does not think those are her thoughts or thoughts about going out or more like I don't feel like it, low motivation, not feeling going out today. Not fit for public consumption don't look right don't feel right. Therapist pointed this out is distortions as well also encourage patient to think about situations and pay attention of thoughts when for example she has anxiety and whether those situations she is telling herself something dangerous. Patient knows who worries about looking foolish knows does not make sense. Thought keeps coming back. She relates it makes sense more do something more comfortable she says that if something worth it to her that would be motivation to go out of house.  Therapist encouraged her to do that more frequently. Plan on seeing her friend from college friend she and husband moved closer to where she is. They can drive to see each other. Met half-way and took pictures. Teeth chattering. Want to be able to see them. Want to get nerve up without being self-conscious and working on it. Needs to get out of herself stop worrying how other people receive her wants to see her and has been friends for a long time. Everything said makes sense how distorted thinking plays a part knows will have to make changes and put effort forward.  Therapist noted positive already if patient wanting to work on seeing her friend more.  Therapist completed assessment, pain evaluation and  treatment plan and patient gave consent to complete virtually.  Signs of progress that are significant for treatment as patient making more connections with family and friends, patient working on health goals.  Provided education on anxiety that it is fight or flight being misfiring anxiety is  not to be believed but challenged, it is a false alarm.  Noted paying attention to thoughts that and accurately assess dangerousness and challenging thoughts also getting used to situations help decrease anxiety the more you avoid the increase of fear happens.  Work with patient on social anxiety and developing a who cares attitude about what other people are thinking providing reasoning that what matters is what she thinks not living your life or others, people not being significant to worry about that reinforced patient saying what other people think about you is none of your business is a good way to express this.  Noted patient may be projecting her own self-image onto others so working on self-esteem is helpful.  Noted patient's motivation to work on this, has maintain abstinence and that is a good building block to work on things, also working on connecting with her friend as a step and making progress on treatment goals.  Therapist provided active listening open questions, supportive interventions  Suicidal/Homicidal: No  Plan: Return again in 2 weeks.2.  Finish nutritional assessment, work on strategies for social anxiety, look more at CBT for anxiety  Diagnosis: Axis I:  bipolar 1 disorder, most recent episode depressed, social phobia, alcohol use disorder moderate dependence, major depressive disorde, recurrent, severe    Axis II: No diagnosis    Cordella Register, LCSW 04/27/2021

## 2021-05-04 ENCOUNTER — Other Ambulatory Visit: Payer: Self-pay

## 2021-05-04 ENCOUNTER — Ambulatory Visit (INDEPENDENT_AMBULATORY_CARE_PROVIDER_SITE_OTHER): Payer: Medicare Other | Admitting: Family

## 2021-05-04 VITALS — BP 116/63 | HR 84 | Temp 98.7°F | Resp 16 | Wt 272.0 lb

## 2021-05-04 DIAGNOSIS — F1010 Alcohol abuse, uncomplicated: Secondary | ICD-10-CM

## 2021-05-04 DIAGNOSIS — E785 Hyperlipidemia, unspecified: Secondary | ICD-10-CM

## 2021-05-04 DIAGNOSIS — R739 Hyperglycemia, unspecified: Secondary | ICD-10-CM | POA: Diagnosis not present

## 2021-05-04 DIAGNOSIS — M109 Gout, unspecified: Secondary | ICD-10-CM | POA: Diagnosis not present

## 2021-05-04 DIAGNOSIS — Z8739 Personal history of other diseases of the musculoskeletal system and connective tissue: Secondary | ICD-10-CM | POA: Insufficient documentation

## 2021-05-04 DIAGNOSIS — Z23 Encounter for immunization: Secondary | ICD-10-CM | POA: Diagnosis not present

## 2021-05-04 DIAGNOSIS — E538 Deficiency of other specified B group vitamins: Secondary | ICD-10-CM

## 2021-05-04 DIAGNOSIS — I10 Essential (primary) hypertension: Secondary | ICD-10-CM

## 2021-05-04 DIAGNOSIS — E042 Nontoxic multinodular goiter: Secondary | ICD-10-CM | POA: Diagnosis not present

## 2021-05-04 DIAGNOSIS — E559 Vitamin D deficiency, unspecified: Secondary | ICD-10-CM

## 2021-05-04 DIAGNOSIS — J45909 Unspecified asthma, uncomplicated: Secondary | ICD-10-CM

## 2021-05-04 DIAGNOSIS — F32A Depression, unspecified: Secondary | ICD-10-CM

## 2021-05-04 MED ORDER — COLCHICINE 0.6 MG PO TABS: ORAL_TABLET | ORAL | 0 refills | Status: DC

## 2021-05-04 NOTE — Patient Instructions (Addendum)
Gout  Gout is a condition that causes painful swelling of the joints. Gout is a type of inflammation of the joints (arthritis). This condition is caused by having too much uric acid in the body. Uric acid is a chemical that forms when the body breaks down substances called purines.Purines are important for building body proteins. When the body has too much uric acid, sharp crystals can form and build up inside the joints. This causes pain and swelling. Gout attacks can happen quickly and may be very painful (acute gout). Over time, the attacks can affect more joints and become more frequent (chronic gout). Gout can also cause uric acid to build up under the skin and inside thekidneys. What are the causes? This condition is caused by too much uric acid in your blood. This can happen because: Your kidneys do not remove enough uric acid from your blood. This is the most common cause. Your body makes too much uric acid. This can happen with some cancers and cancer treatments. It can also occur if your body is breaking down too many red blood cells (hemolytic anemia). You eat too many foods that are high in purines. These foods include organ meats and some seafood. Alcohol, especially beer, is also high in purines. A gout attack may be triggered by trauma or stress. What increases the risk? You are more likely to develop this condition if you: Have a family history of gout. Are female and middle-aged. Are female and have gone through menopause. Are obese. Frequently drink alcohol, especially beer. Are dehydrated. Lose weight too quickly. Have an organ transplant. Have lead poisoning. Take certain medicines, including aspirin, cyclosporine, diuretics, levodopa, and niacin. Have kidney disease. Have a skin condition called psoriasis. What are the signs or symptoms? An attack of acute gout happens quickly. It usually occurs in just one joint. The most common place is the big toe. Attacks often start  at night. Other joints that may be affected include joints of the feet, ankle, knee, fingers, wrist, or elbow. Symptoms of this condition may include: Severe pain. Warmth. Swelling. Stiffness. Tenderness. The affected joint may be very painful to touch. Shiny, red, or purple skin. Chills and fever. Chronic gout may cause symptoms more frequently. More joints may be involved. You may also have white or yellow lumps (tophi) on your hands or feet or in other areas near your joints. How is this diagnosed? This condition is diagnosed based on your symptoms, medical history, and physical exam. You may have tests, such as: Blood tests to measure uric acid levels. Removal of joint fluid with a thin needle (aspiration) to look for uric acid crystals. X-rays to look for joint damage. How is this treated? Treatment for this condition has two phases: treating an acute attack and preventing future attacks. Acute gout treatment may include medicines to reduce pain and swelling, including: NSAIDs. Steroids. These are strong anti-inflammatory medicines that can be taken by mouth (orally) or injected into a joint. Colchicine. This medicine relieves pain and swelling when it is taken soon after an attack. It can be given by mouth or through an IV. Preventive treatment may include: Daily use of smaller doses of NSAIDs or colchicine. Use of a medicine that reduces uric acid levels in your blood. Changes to your diet. You may need to see a dietitian about what to eat and drink to prevent gout. Follow these instructions at home: During a gout attack  If directed, put ice on the affected area: Put   ice in a plastic bag. Place a towel between your skin and the bag. Leave the ice on for 20 minutes, 2-3 times a day. Raise (elevate) the affected joint above the level of your heart as often as possible. Rest the joint as much as possible. If the affected joint is in your leg, you may be given crutches to  use. Follow instructions from your health care provider about eating or drinking restrictions.  Avoiding future gout attacks Follow a low-purine diet as told by your dietitian or health care provider. Avoid foods and drinks that are high in purines, including liver, kidney, anchovies, asparagus, herring, mushrooms, mussels, and beer. Maintain a healthy weight or lose weight if you are overweight. If you want to lose weight, talk with your health care provider. It is important that you do not lose weight too quickly. Start or maintain an exercise program as told by your health care provider. Eating and drinking Drink enough fluids to keep your urine pale yellow. If you drink alcohol: Limit how much you use to: 0-1 drink a day for women. 0-2 drinks a day for men. Be aware of how much alcohol is in your drink. In the [place] one drink equals one 12 oz bottle of beer (355 mL) one 5 oz glass of wine (148 mL), or one 1 oz glass of hard liquor (44 mL). General instructions Take over-the-counter and prescription medicines only as told by your health care provider. Do not drive or use heavy machinery while taking prescription pain medicine. Return to your normal activities as told by your health care provider. Ask your health care provider what activities are safe for you. Keep all follow-up visits as told by your health care provider. This is important. Contact a health care provider if you have: Another gout attack. Continuing symptoms of a gout attack after 10 days of treatment. Side effects from your medicines. Chills or a fever. Burning pain when you urinate. Pain in your lower back or belly. Get help right away if you: Have severe or uncontrolled pain. Cannot urinate. Summary Gout is painful swelling of the joints caused by inflammation. The most common site of pain is the big toe, but it can affect other joints in the body. Medicines and dietary changes can help to prevent and treat gout  attacks. This information is not intended to replace advice given to you by your health care provider. Make sure you discuss any questions you have with your healthcare provider. Document Revised: 05/15/2018 Document Reviewed: 05/15/2018 Elsevier Patient Education  2022 Elsevier Inc.  

## 2021-05-04 NOTE — Progress Notes (Signed)
Subjective:     Patient ID: Julie Powell, female    DOB: May 30, 1961, 60 y.o.   MRN: [id number]  Chief Complaint  Patient presents with   Hypertension   Depression   Hyperlipidemia    Here for follow up   Joint Swelling    Complains of painful, swollen joint right foot   Alcohol Problem    Patient reports she has been sober for 7 weeks    Patient is in today for follow up.  ETOH Abuse- last drink was 7 weeks ago. Was drinking 14-15 beers/day.  Wt Readings from Last 3 Encounters:  05/04/21 272 lb (123.4 kg)  04/18/21 277 lb (125.6 kg)  11/17/20 279 lb 6.4 oz (126.7 kg)   She quit cold [place] on her own. She had withdrawal symptoms at home for several week- now resolved.  Still has cravings but trying her best not to give in.   Depression- continues to follow with psychiatry/counselor. Mood has been up and down.  His son and his girlfriend have been taking her out of the house.  She is starting to go out more.    Hyperlipidemia- not on a statin.  Lab Results  Component Value Date   CHOL 254 (H) 02/03/2020   HDL 156.50 02/03/2020   LDLCALC 83 02/03/2020   TRIG 72.0 02/03/2020   CHOLHDL 2 02/03/2020   B12 deficiency- maintained on b12 514mg PO daily.   Asthma- uses albuterol prn. She has been using a bit more often than usual. Using 3-4 times a week- usually needs at night right before bedtime.    HTN- maintained on lisinopril 10 mg.  BP Readings from Last 3 Encounters:  05/04/21 116/63  11/17/20 114/65  08/25/20 (!) 148/65   Pt c/o painful, swelling- right foot. Started 2 days ago.  Took a few motrin yesterday which helped.    Health Maintenance Due  Topic Date Due   Pneumococcal Vaccine 064663Years old (1 - PCV) Never done   Zoster Vaccines- Shingrix (1 of 2) Never done   DEXA SCAN  04/04/2019    Past Medical History:  Diagnosis Date   Anxiety    Asthma    B12 deficiency    Bipolar 1 disorder ([place] 2010   ect   Fibroids    Hypertension    Plantar  fasciitis 2013   Sciatica    RT hip   Vitamin D deficiency     Past Surgical History:  Procedure Laterality Date   CWatkins  "thyroglycol duct cyst in neck" had dysphagia, was told benign   GASTRIC BYPASS  2003   MICRODISCECTOMY LUMBAR  09/11/14   L3-L4  Dr CSaintclair Halsted  PILONIDAL CYST DRAINAGE  1982   REDUCTION MAMMAPLASTY     SP CHOLECYSTOMY  1986    Family History  Adopted: Yes  Problem Relation Age of Onset   Aneurysm Father    Alcohol abuse Father    Alcohol abuse Brother    Alcohol abuse Brother    Cancer Brother        prostate   Alcohol abuse Brother    Lung cancer Sister    Cancer Sister        lung   Thyroid disease Neg Hx    Diabetes Neg Hx    Hypertension Neg Hx     Social History   Socioeconomic History   Marital status: Divorced    Spouse name: Not on file  Number of children: 1   Years of education: Not on file   Highest education level: Bachelor's degree (e.g., BA, AB, BS)  Occupational History   Not on file  Tobacco Use   Smoking status: Former    Packs/day: 0.50    Years: 10.00    Pack years: 5.00    Types: Cigarettes    Quit date: 11/07/1991    Years since quitting: 29.5   Smokeless tobacco: Never  Vaping Use   Vaping Use: Never used  Substance and Sexual Activity   Alcohol use: Not Currently   Drug use: Not Currently    Types: "Crack" cocaine    Comment: Caffeine- Carbonated beverage 16 ounces.   Sexual activity: Never    Birth control/protection: Post-menopausal  Other Topics Concern   Not on file  Social History Narrative   Lives with 67 year old son   She is on disability due to depression   In the past she has worked as a Education officer, community for Systems developer   Former Mudlogger   Completed bachelor's degree   Divorced.   Enjoys television   Right handed    Apartment 1st floor    Social Determinants of Health   Financial Resource  Strain: Low Risk    Difficulty of Paying Living Expenses: Not very hard  Food Insecurity: No Food Insecurity   Worried About Charity fundraiser in the Last Year: Never true   Ran Out of Food in the Last Year: Never true  Transportation Needs: No Transportation Needs   Lack of Transportation (Medical): No   Lack of Transportation (Non-Medical): No  Physical Activity: Inactive   Days of Exercise per Week: 0 days   Minutes of Exercise per Session: 0 min  Stress: No Stress Concern Present   Feeling of Stress : Not at all  Social Connections: Socially Isolated   Frequency of Communication with Friends and Family: More than three times a week   Frequency of Social Gatherings with Friends and Family: Once a week   Attends Religious Services: Never   Marine scientist or Organizations: No   Attends Music therapist: Never   Marital Status: Divorced  Human resources officer Violence: Not At Risk   Fear of Current or Ex-Partner: No   Emotionally Abused: No   Physically Abused: No   Sexually Abused: No    Outpatient Medications Prior to Visit  Medication Sig Dispense Refill   acamprosate (CAMPRAL) 333 MG tablet Take 1 tablet (333 mg total) by mouth in the morning and at bedtime. 60 tablet 1   albuterol (PROAIR HFA) 108 (90 Base) MCG/ACT inhaler Inhale 2 puffs into the lungs every 6 (six) hours as needed for wheezing or shortness of breath. 20.1 g 1   buPROPion (WELLBUTRIN) 100 MG tablet TAKE 2 TABLETS(200 MG) BY MOUTH TWICE DAILY 360 tablet 0   cholecalciferol (VITAMIN D) 1000 UNITS tablet Take 1,000 Units by mouth 3 (three) times a week.      hyoscyamine (LEVBID) 0.375 MG 12 hr tablet Take 0.375 mg by mouth daily.  0   lisinopril (ZESTRIL) 10 MG tablet Take 1 tablet (10 mg total) by mouth daily. 90 tablet 1   lithium carbonate (LITHOBID) 300 MG CR tablet TAKE 1 TABLET(300 MG) BY MOUTH TWICE DAILY 180 tablet 0   Multiple Vitamin (MULTIVITAMIN) tablet Take 1 tablet by mouth 3  (three) times a week.      PARoxetine (PAXIL)  30 MG tablet ONE A DAY 90 tablet 0   vitamin B-12 (CYANOCOBALAMIN) 500 MCG tablet Take 500 mcg by mouth. 2-3 times per week     Zoster Vaccine Adjuvanted [facility]) injection Inject 0.70m IM now and again in 2-6 months. 0.5 mL 1   No facility-administered medications prior to visit.    No Known Allergies  Review of Systems  Psychiatric/Behavioral:  Positive for depression.       Objective:    Physical Exam Constitutional:      Appearance: She is well-developed.  Cardiovascular:     Rate and Rhythm: Normal rate and regular rhythm.     Heart sounds: Normal heart sounds. No murmur heard. Pulmonary:     Effort: Pulmonary effort is normal. No respiratory distress.     Breath sounds: Normal breath sounds. No wheezing.  Musculoskeletal:     Comments: + swelling, erythema, tenderness at the base of the right great toe.   Psychiatric:        Behavior: Behavior normal.        Thought Content: Thought content normal.        Judgment: Judgment normal.    BP 116/63 (BP Location: Right Arm, Patient Position: Sitting, Cuff Size: Large)   Pulse 84   Temp 98.7 F (37.1 C) (Oral)   Resp 16   Wt 272 lb (123.4 kg)   LMP 09/11/2013   SpO2 100%   BMI 46.69 kg/m  Wt Readings from Last 3 Encounters:  05/04/21 272 lb (123.4 kg)  04/18/21 277 lb (125.6 kg)  11/17/20 279 lb 6.4 oz (126.7 kg)       Assessment & Plan:   Problem List Items Addressed This Visit       Unprioritized   Vitamin D deficiency   Relevant Orders   Vitamin D (25 hydroxy)   Vitamin B 12 deficiency - Primary   Relevant Orders   B12   Multinodular goiter   Relevant Orders   TSH   Hypertension   Relevant Orders   Comp Met (CMET)   Lipid panel   Hyperlipidemia   Relevant Orders   Comp Met (CMET)   Lipid panel   Depression    Overall improving following discontinuation of ETOH.  Management per psych.        Asthma    Fair control. Continue albuterol MDI  PRN       Alcohol abuse    In remission. Commended pt on her good work.        Other Visit Diagnoses     Acute gout involving toe of right foot, unspecified cause       Relevant Medications   colchicine 0.6 MG tablet   Other Relevant Orders   Uric acid   Hyperglycemia       Relevant Orders   Hemoglobin A1c       I am having Julie Powell start on colchicine. I am also having her maintain her multivitamin, cholecalciferol, hyoscyamine, Shingrix, albuterol, vitamin B-12, lisinopril, buPROPion, PARoxetine, lithium carbonate, and acamprosate.  Meds ordered this encounter  Medications   colchicine 0.6 MG tablet    Sig: Take 2 tabs by mouth now and 1 tab in 1 hour. May repeat tomorrow if gout symptoms do not improve.    Dispense:  6 tablet    Refill:  0    Order Specific Question:   Supervising Provider    Answer:   BPenni HomansA [[[id number]]

## 2021-05-04 NOTE — Assessment & Plan Note (Signed)
In remission. Commended pt on her good work.

## 2021-05-04 NOTE — Assessment & Plan Note (Signed)
Overall improving following discontinuation of ETOH.  Management per psych.

## 2021-05-04 NOTE — Assessment & Plan Note (Signed)
BP Readings from Last 3 Encounters:  05/04/21 116/63  11/17/20 114/65  08/25/20 (!) 148/65   BP looks great. Continue lisinopril 10mg .

## 2021-05-04 NOTE — Assessment & Plan Note (Signed)
Fair control. Continue albuterol MDI PRN

## 2021-05-05 LAB — COMPREHENSIVE METABOLIC PANEL
ALT: 18 U/L (ref 0–35)
AST: 17 U/L (ref 0–37)
Albumin: 4 g/dL (ref 3.5–5.2)
Alkaline Phosphatase: 107 U/L (ref 39–117)
BUN: 16 mg/dL (ref 6–23)
CO2: 27 mEq/L (ref 19–32)
Calcium: 9.9 mg/dL (ref 8.4–10.5)
Chloride: 102 mEq/L (ref 96–112)
Creatinine, Ser: 1 mg/dL (ref 0.40–1.20)
GFR: 62.45 mL/min (ref >60.00)
Glucose, Bld: 81 mg/dL (ref 70–99)
Potassium: 4.3 mEq/L (ref 3.5–5.1)
Sodium: 137 mEq/L (ref 135–145)
Total Bilirubin: 0.6 mg/dL (ref 0.2–1.2)
Total Protein: 6.8 g/dL (ref 6.0–8.3)

## 2021-05-05 LAB — TSH: TSH: 2.77 u[IU]/mL (ref 0.35–5.50)

## 2021-05-05 LAB — LIPID PANEL
Cholesterol: 216 mg/dL — ABNORMAL HIGH (ref 0–200)
HDL: 90.9 mg/dL (ref >39.00)
LDL Cholesterol: 108 mg/dL — ABNORMAL HIGH (ref 0–99)
NonHDL: 124.87
Total CHOL/HDL Ratio: 2
Triglycerides: 82 mg/dL (ref 0.0–149.0)
VLDL: 16.4 mg/dL (ref 0.0–40.0)

## 2021-05-05 LAB — VITAMIN B12: Vitamin B-12: 685 pg/mL (ref 211–911)

## 2021-05-05 LAB — HEMOGLOBIN A1C: Hgb A1c MFr Bld: 6.1 % (ref 4.6–6.5)

## 2021-05-05 LAB — URIC ACID: Uric Acid, Serum: 6.1 mg/dL (ref 2.4–7.0)

## 2021-05-05 LAB — VITAMIN D 25 HYDROXY (VIT D DEFICIENCY, FRACTURES): VITD: 34.55 ng/mL (ref 30.00–100.00)

## 2021-05-06 ENCOUNTER — Encounter: Payer: Self-pay | Admitting: Family

## 2021-05-06 DIAGNOSIS — R739 Hyperglycemia, unspecified: Secondary | ICD-10-CM | POA: Insufficient documentation

## 2021-05-12 ENCOUNTER — Ambulatory Visit (HOSPITAL_COMMUNITY): Payer: Medicare Other | Admitting: Licensed Clinical Social Worker

## 2021-05-19 ENCOUNTER — Ambulatory Visit (INDEPENDENT_AMBULATORY_CARE_PROVIDER_SITE_OTHER): Payer: Medicare Other | Admitting: Psychiatry

## 2021-05-19 ENCOUNTER — Encounter (HOSPITAL_COMMUNITY): Payer: Self-pay | Admitting: Psychiatry

## 2021-05-19 VITALS — BP 138/80 | Ht 64.0 in | Wt 268.0 lb

## 2021-05-19 DIAGNOSIS — F1020 Alcohol dependence, uncomplicated: Secondary | ICD-10-CM | POA: Diagnosis not present

## 2021-05-19 DIAGNOSIS — F4010 Social phobia, unspecified: Secondary | ICD-10-CM

## 2021-05-19 DIAGNOSIS — F3130 Bipolar disorder, current episode depressed, mild or moderate severity, unspecified: Secondary | ICD-10-CM

## 2021-05-19 MED ORDER — PAROXETINE HCL 30 MG PO TABS: ORAL_TABLET | ORAL | 0 refills | Status: DC

## 2021-05-19 MED ORDER — ACAMPROSATE CALCIUM 333 MG PO TBEC: 333.0000 mg | DELAYED_RELEASE_TABLET | Freq: Two times a day (BID) | ORAL | 1 refills | Status: DC

## 2021-05-19 MED ORDER — LITHIUM CARBONATE ER 300 MG PO TBCR: EXTENDED_RELEASE_TABLET | ORAL | 0 refills | Status: DC

## 2021-05-19 MED ORDER — BUPROPION HCL 100 MG PO TABS
ORAL_TABLET | ORAL | 0 refills | Status: DC
Start: 2021-05-19 — End: 2021-11-14

## 2021-05-19 NOTE — Progress Notes (Signed)
Patient ID: Julie Powell, female   DOB: August 30, 1961, 60 y.o.   MRN: [id number] [facility] Follow-up Outpatient Visit  Julie Powell 1961/03/07        CC  depression follow up  HPI Comments: Julie Powell is a 60   Y/O woman with a past psychiatric history Major Depressive Disorder, severe, recurrent. Mood disorder due to [facility], Alcohol use disorder and Social Phobia.    Doing fair remains sober for the last nearly 2 to 3 months She is trying to associate negative association with alcohol use Campral is helping we will continue    Depression is somewhat better  Aggravating factor: Loneliness  modifying factor: housing son   Duration: since young age  Timing: more so in evening   Context: alcohol use history\   Associated signs and symptoms: Denies psychosis   Traumatic Brain Injury: No none   Review of Systems  Cardiovascular:  Negative for chest pain.  Skin:  Negative for itching.  Neurological:  Negative for headaches.  Psychiatric/Behavioral:  Positive for substance abuse. Negative for suicidal ideas.   Vitals:   05/19/21 1302  BP: 138/80  Weight: 268 lb (121.6 kg)  Height: 5\' 4"  (1.626 m)   Physical Exam  Constitutional: She appears well-developed and well-nourished. No distress.  Skin: She is not diaphoretic.  No tremors of hands noted while patient was interviewed.     Past Medical History: Reviewed  Past Medical History:  Diagnosis Date   Anxiety    Asthma    B12 deficiency    Bipolar 1 disorder ([place]) 2010   ect   Fibroids    Hyperglycemia    Hypertension    Plantar fasciitis 2013   Sciatica    RT hip   Vitamin D deficiency     Allergies: No Known Allergies   Current Medications: Reviewed   Current Outpatient Medications on File Prior to Visit  Medication Sig Dispense Refill   albuterol (PROAIR HFA) 108 (90 Base) MCG/ACT inhaler Inhale 2 puffs into the lungs every 6 (six) hours as needed for wheezing or shortness of breath. 20.1  g 1   cholecalciferol (VITAMIN D) 1000 UNITS tablet Take 1,000 Units by mouth 3 (three) times a week.      colchicine 0.6 MG tablet Take 2 tabs by mouth now and 1 tab in 1 hour. May repeat tomorrow if gout symptoms do not improve. 6 tablet 0   hyoscyamine (LEVBID) 0.375 MG 12 hr tablet Take 0.375 mg by mouth daily.  0   lisinopril (ZESTRIL) 10 MG tablet Take 1 tablet (10 mg total) by mouth daily. 90 tablet 1   Multiple Vitamin (MULTIVITAMIN) tablet Take 1 tablet by mouth 3 (three) times a week.      vitamin B-12 (CYANOCOBALAMIN) 500 MCG tablet Take 500 mcg by mouth. 2-3 times per week     Zoster Vaccine Adjuvanted [facility]) injection Inject 0.5mg  IM now and again in 2-6 months. 0.5 mL 1   No current facility-administered medications on file prior to visit.      Family History: Reviewed  Family History   Problem  Relation  Age of Onset     Adopted: Yes     Aneurysm  Father     Psychiatric Specialty Exam:  Objective: Appearance:   Eye Contact::   Speech: Clear and Coherent and Normal Rate   Volume: Normal   Mood fair  Affect: congruent  Thought Process: Goal Directed, Logical and Loose   Orientation: Full  Thought Content: WDL   Suicidal Thoughts: Patient denies   Homicidal Thoughts: Patient denies   Judgement: Fair   Insight: Fair   Psychomotor Activity: Normal   Akathisia: No   Handed: Right   Memory: Immediate 3/3, Recent: 1/3   [place] of knowledge-average to above average.  AIMS (if indicated): Not indicated at this time.   Assets: Communication Skills  Desire for Improvement    Assessment:  AXIS I: Major Depressive Disorder, ; GAD: insomnia: Alcohol use disorder Treatment Plan/Recommendations:   Plan of Care:        Marland Kitchen   Medications:  Prior documentation reviewed Bipolar depression: Doing fair continue Wellbutrin lithium we will request for blood work also on Paxil  understands to get blood work ,  Anxiety: I better continue  Paxil alcohol use: Discussed abstinence continue Campral  Fu 3 months or earlier   refills sent due were sent  Face to face time spent: 60min          Merian Capron, M.D.  05/19/2021 1:19 PM

## 2021-05-20 LAB — LITHIUM LEVEL: Lithium Lvl: 0.7 mmol/L (ref 0.6–1.2)

## 2021-05-21 ENCOUNTER — Other Ambulatory Visit: Payer: Self-pay | Admitting: Family

## 2021-05-21 DIAGNOSIS — I10 Essential (primary) hypertension: Secondary | ICD-10-CM

## 2021-05-26 ENCOUNTER — Other Ambulatory Visit: Payer: Self-pay

## 2021-05-26 ENCOUNTER — Ambulatory Visit (INDEPENDENT_AMBULATORY_CARE_PROVIDER_SITE_OTHER): Payer: Medicare Other | Admitting: Licensed Clinical Social Worker

## 2021-05-26 DIAGNOSIS — F4010 Social phobia, unspecified: Secondary | ICD-10-CM | POA: Diagnosis not present

## 2021-05-26 DIAGNOSIS — F3130 Bipolar disorder, current episode depressed, mild or moderate severity, unspecified: Secondary | ICD-10-CM

## 2021-05-26 DIAGNOSIS — F1020 Alcohol dependence, uncomplicated: Secondary | ICD-10-CM | POA: Diagnosis not present

## 2021-05-26 DIAGNOSIS — F332 Major depressive disorder, recurrent severe without psychotic features: Secondary | ICD-10-CM

## 2021-05-26 NOTE — Progress Notes (Signed)
Virtual Visit via Telephone Note  I connected with Julie Powell on 05/26/21 at  1:00 PM EDT by telephone and verified that I am speaking with the correct person using two identifiers.  Location: Patient: home Provider: home office   I discussed the limitations, risks, security and privacy concerns of performing an evaluation and management service by telephone and the availability of in person appointments. I also discussed with the patient that there may be a patient responsible charge related to this service. The patient expressed understanding and agreed to proceed.   I discussed the assessment and treatment plan with the patient. The patient was provided an opportunity to ask questions and all were answered. The patient agreed with the plan and demonstrated an understanding of the instructions.   The patient was advised to call back or seek an in-person evaluation if the symptoms worsen or if the condition fails to improve as anticipated.  I provided 45 minutes of non-face-to-face time during this encounter.  THERAPIST PROGRESS NOTE  Session Time: 1:00 PM to 1:45 PM  Participation Level: Active  Behavioral Response: CasualAlertAnxious and Euthymic  Type of Therapy: Individual Therapy  Treatment Goals addressed:  Continued sobriety, social anxiety, reestablished all relationships with family and friends, work on health goals work on being less self-conscious about things Interventions: CBT, Solution Focused, Strength-based, Supportive, and Other: self-esteem  Summary: Julie Powell is a 60 y.o. female who presents with haven't had bad back or in pain usually does when rains so that is a positive thing. Plan has not progressed in visiting her friend but not ruled out. Has focused on other things. Moving around more, going out more and organizing house. Want to organize things to help her feel better. Maintaining plants sends her out of the house has to go to store keeps her moving  has to pick up things and do things. Not putting a dent in the social part but feels a step in the right direction and therapist agrees. Almost feels getting manic can be obsessive about things and remind herself not to go that far. Keeping it under control. She wants to keep things this speed as she knows she can go back there-turn everything off, not get out of bed and don't want go back there. Helps to talk to somebody about it. Motivator. She explains that it helps her to tell herself that "I can do better than what I doing". Didn't feel like talking last time because of the isolation doesn't vent to friends so just didn't feel like it. Should have one friend that call upon and can call upon her. All her friends are away. Always kept people at arms length insecure and full anxiety wants to go into direction but don't want to do it you know what is good but the resistance is there. Knows what helped stopped drinking feeling good in that respect thank goodness not doing it and hopes that stays that way. Happened so long quitting and going back and this is the longest time sober. Isolation there when doing that. Not going anywhere when drinking. Son didn't like enabling and relationship now better now that not drinking. A lot of issues come from past not dealing with it. Issues with people who are not living and still dealt with how they make her feel and letting them affect how she feels about herself. Got to go and has to let it go. Any self-doubt will include running back to that what somebody said and how someone  made her feel. Figure out how to let it go.  Adopted mom sister referred to her as mom. She went out of her way to describe patient as in a horrible way to people and patient couldn't handle with anxiety kind of broke her. It made her run in corner and never see those people again. Nobody reached out to say anything accept as true and left her hanging. Adopted so nobody cared. Made her think that the  connection can be cut that easily. Felt that way pack her bag and ran away.  Realizes may be not the case that everybody felt that way just because they did reach out.  It was because Jehovah Witness and whole bunch of other stuff nothing could do was good enough. She was older accustomed to being independent decided to sell home and live with patient. When got there everything upside down. Think she lost her independent and became crabby person. Patient relates "It is such a waste of time to hold to that." In reviewing session patient relates to try to recognize the voice so when hear it can try to turn it around instead of letting go face it and say not true. Doctor and lost weight maybe stopping drinking made her feel good because lost weight. Not purposely walking not ready for that feel miserable when it is so hot don't want to be seen but can walk when it is two hours walking at store and sees the value in that. Atrophied with staying inside point could fall easily needed to pay attention where walking. Trying to be more active.  Therapist encouraged this for different reasons including health, self-esteem, mood.      Therapist reviewed symptoms, facilitated expression of thoughts and feeling and noted patient making progress with symptoms through activity and getting out, she has not started drinking and that is have a positive affect on her.  Therapist noted that these positive activities will leave patient in a positive direction with symptoms as well as finding other outlets to drinking will have positive outcomes for her as patient already noting such as less isolation, better relationship with son, losing weight.  Noted the positive steps have a way of building on themselves to move her further with goals of working on anxiety including social anxiety.  Noted working on stronger sense of self will help to with social anxiety less concerned with how other people think.  Therapist introduced concept that  we have unconditional worth patient has as much value is anybody else and she determines her own value.  Noted self-care is important part of self-esteem and there are many avenues for it.  Things such as exercise, nurturing activities.  Work with patient on significant issue of internalizing voices from the past therapist pointing out recognizing the source will help distance herself from that realizing its not her own voice and not accurate voice in this case her mom.  Noted negative voice comes from her mom's own struggles that she rejected on patient.  Work with patient on developing a more accurate voice that is caring and supportive.  Noted patient paying more attention to what how she talks to her self is helpful countering negative talk will help her with self-esteem.  Therapist provided active listening, open questions, supportive interventions Suicidal/Homicidal: No  Plan: Return again in  5 weeks.2.  Therapist work with patient on social anxiety, anxiety looking at CBT [place], complete nutritional assessment, work on self-esteem. 3.  Patient challenge negative internal voice  that is an accurate notice where comes from to help her distance herself from it.  Diagnosis: Axis I:  bipolar 1 disorder, most recent episode depressed, social phobia, alcohol use disorder moderate dependence, major depressive disorde, recurrent, severe    Axis II: No diagnosis    Cordella Register, LCSW 05/26/2021

## 2021-06-06 ENCOUNTER — Telehealth: Payer: Self-pay | Admitting: Pharmacist

## 2021-06-06 NOTE — Chronic Care Management (AMB) (Signed)
**Note Powell-Identified via Obfuscation**     Chronic Care Management Pharmacy Assistant   Name: Julie Powell  MRN: [id number] DOB: 08/15/1961   Reason for Encounter: Disease State General    Recent office visits:  05/04/21-Julie Inda Castle, NP (PCP) Labs ordered. Start on Colchicine 0.6 mg tab Take 2 tabs by mouth now and 1 tab in 1 hour. May repeat tomorrow if gout symptoms do not improve. Follow up in 3 months. 04/18/21 Julie Antu LPN. Seen for Medicare Annual Wellness. Recent consult visits:  05/19/21-Julie Powell Nurse, MD (Behavioral health) Notes not available. 04/27/21-Julie Powell (Behavioral health) Notes not available. 03/23/21-Julie Powell (Behavioral health) Notes not available. 03/16/21-Julie Powell Nurse, MD (Behavioral health) Notes not available. Hospital visits:  None in previous 6 months  Medications: Outpatient Encounter Medications as of 06/06/2021  Medication Sig Note   acamprosate (CAMPRAL) 333 MG tablet Take 1 tablet (333 mg total) by mouth in the morning and at bedtime.    albuterol (PROAIR HFA) 108 (90 Base) MCG/ACT inhaler Inhale 2 puffs into the lungs every 6 (six) hours as needed for wheezing or shortness of breath.    buPROPion (WELLBUTRIN) 100 MG tablet TAKE 2 BID    cholecalciferol (VITAMIN D) 1000 UNITS tablet Take 1,000 Units by mouth 3 (three) times a week.     colchicine 0.6 MG tablet Take 2 tabs by mouth now and 1 tab in 1 hour. May repeat tomorrow if gout symptoms do not improve.    hyoscyamine (LEVBID) 0.375 MG 12 hr tablet Take 0.375 mg by mouth daily. 12/21/2015: Received from: External Pharmacy   lisinopril (ZESTRIL) 10 MG tablet TAKE 1 TABLET(10 MG) BY MOUTH DAILY    lithium carbonate (LITHOBID) 300 MG CR tablet BID    Multiple Vitamin (MULTIVITAMIN) tablet Take 1 tablet by mouth 3 (three) times a week.     PARoxetine (PAXIL) 30 MG tablet ONE A DAY    vitamin B-12 (CYANOCOBALAMIN) 500 MCG tablet Take 500 mcg by mouth. 2-3 times per week    Zoster Vaccine Adjuvanted [facility])  injection Inject 0.'5mg'$  IM now and again in 2-6 months.    No facility-administered encounter medications on file as of 06/06/2021.   Have you had any problems recently with your health? Patient states she has no problems with her health at this time.  Have you had any problems with your pharmacy? Patient states she has no problems with her pharmacy.  What issues or side effects are you having with your medications? Patient states she has no issues or side effects to her medications.  What would you like me to pass along to [employer], CPP for them to help you with?  Patient states there is nothing at this time.  What can we do to take care of you better? Patient states there is nothing at this time.  Star Rating Drugs: Lisinopril 10 mg Last filled:05/22/21 90 DS  Julie Powell, [place]

## 2021-06-30 ENCOUNTER — Ambulatory Visit (HOSPITAL_COMMUNITY): Payer: Medicare Other | Admitting: Licensed Clinical Social Worker

## 2021-06-30 DIAGNOSIS — F4010 Social phobia, unspecified: Secondary | ICD-10-CM

## 2021-06-30 DIAGNOSIS — F1020 Alcohol dependence, uncomplicated: Secondary | ICD-10-CM

## 2021-06-30 DIAGNOSIS — F3130 Bipolar disorder, current episode depressed, mild or moderate severity, unspecified: Secondary | ICD-10-CM

## 2021-06-30 NOTE — Progress Notes (Signed)
Patient now feeling well would like to cancel and reschedule. She rescheduled with therapist.

## 2021-07-18 ENCOUNTER — Telehealth: Payer: Medicare Other

## 2021-07-20 ENCOUNTER — Ambulatory Visit (INDEPENDENT_AMBULATORY_CARE_PROVIDER_SITE_OTHER): Payer: Medicare Other | Admitting: Licensed Clinical Social Worker

## 2021-07-20 DIAGNOSIS — F332 Major depressive disorder, recurrent severe without psychotic features: Secondary | ICD-10-CM

## 2021-07-20 DIAGNOSIS — F3130 Bipolar disorder, current episode depressed, mild or moderate severity, unspecified: Secondary | ICD-10-CM | POA: Diagnosis not present

## 2021-07-20 DIAGNOSIS — F1020 Alcohol dependence, uncomplicated: Secondary | ICD-10-CM

## 2021-07-20 DIAGNOSIS — F4010 Social phobia, unspecified: Secondary | ICD-10-CM

## 2021-07-20 NOTE — Progress Notes (Signed)
Virtual Visit via Telephone Note  I connected with Julie Powell on 07/20/21 at  3:00 PM EDT by telephone and verified that I am speaking with the correct person using two identifiers.  Location: Patient: home Provider: home office   I discussed the limitations, risks, security and privacy concerns of performing an evaluation and management service by telephone and the availability of in person appointments. I also discussed with the patient that there may be a patient responsible charge related to this service. The patient expressed understanding and agreed to proceed.   I discussed the assessment and treatment plan with the patient. The patient was provided an opportunity to ask questions and all were answered. The patient agreed with the plan and demonstrated an understanding of the instructions.   The patient was advised to call back or seek an in-person evaluation if the symptoms worsen or if the condition fails to improve as anticipated.  I provided 53 minutes of non-face-to-face time during this encounter.   THERAPIST PROGRESS NOTE  Session Time: 3:00 PM to 3:53 PM  Participation Level: Active  Behavioral Response: CasualAlertEuthymic  Type of Therapy: Individual Therapy  Treatment Goals addressed:  Continued sobriety, social anxiety, reestablished all relationships with family and friends, work on health goals work on being less self-conscious about things Interventions: CBT, Motivational Interviewing, Solution Focused, Strength-based, Supportive, and Other: coping  Summary: Julie Powell is a 60 y.o. female who presents with not wanted to go back, not drinking, tried for year to not drink on and off, this the longest time not drinking. Sometimes hard when on television, when on mind, sometimes easier than others.  Does not go to grocery store on certain days when is on her mind as she can get triggered by saying things like seeing beer, can see on television and manages  triggered by watching cartoons. Feels better more productive mind is on other things, do things around house, go out for things. Do things for others like her son. Anesthetize and can be ok drinking but also recognizes aspects not happy not present with the rest of the world, not feeling of things, just with herself. When drinking not doing anything more isolated. This is much better if wants to go somewhere can get up and go and not tied to home even if comfort is home. Still have days not good. For some reason in her mind doesn't feel good and have to lay down. Says tomorrow will be better has hope everything in the past she could be hopeless, nothing get better keep on, nothing going to change doesn't feel that way any more. Probably not disappear but dealing with better. Thinking can be distorted thinking crazy stuff. Don't do that not going help. Nothing stays the same everything changes. Remember not the only one have the feelings, others have that not just you not alone, give herself a break if get down on herself not good. No matter who you are and circumstances people have to deal with crap. Better relationship with son. Patient feels doesn't matter what other think not bother her what she feels when walk in crowd doesn't think they are thinking anything about her, she feels like a huge giraffe huge walked in crowd, know nobody thinking anything, think people will notice and don't want to notice. Worries about things such as starting to loose her voice, when somebody asks her something panic, say the wrong thing or voice going to go.  Discussed detaching herself from internalized negative voice that  comes from family and patient relates past pains from family and mother hurting her more than hurt about things going on now, things holding onto things and gaping wound. Wound won't go away comes up in mind. Talked about memories childhood and losses of leaving parents and going house to house and feeling like  not belonging anywhere. Thinks she has had social anxiety since 9-10 when little. In class when had a question not being able to say it. Didn't want to call attention to herself at all, help her be unnoticed.  Noted thoughts that may help with this anxiety include don't have anything to prove to anybody.  Patient noted doing pretty good not in crisis mode helped have a reference point from therapy but she also knows she has to practice that and that is up to her.     Therapist reviewed symptoms, facilitated expression of thoughts and feelings.  Utilize motivational interviewing to support patient in good progress she is making with not drinking noting feeling better, noting she is able to do things to help her feel better such as being more productive not isolating, doing things to feel good about herself.  Worked with patient on building toebox for coping skills talked about a strategy with emotions of not reacting to things when mood is escalated better to write it out till de-escalated and noted motions are like waves, and go so that they will fade.  Noted patient's good coping of having hope but no things will be better implementing both positive perspective as well as recognition that things change.  Noted perspective taking is helpful and her advice to send that there are good to be annoyances and frustrations helps to except reality so when is not is triggered is well as self forgiveness which gives oneself a break doing the best we can attitude.  Noted not feeling alone is helpful for patient.  Worked on social anxiety and challenged patient on her perspective of not being concerned about other people where she would not be concerned about her voice or reacting badly, noted working on self-esteem is distancing herself from negative voices, or an accurate such as voices from the past that self-esteem serves as a good foundation to work on social anxiety helpful for patient to work on noting weeks after  cells as we are, we do not have to prove anything to anybody is all helpful for positive self talk that helps build self-esteem.  [facility]ust trauma work and being habituated to stories as helping work through trauma also continuing to work on self-esteem and social anxiety is helpful for patient's symptoms.  Therapist provided active listening open questions, supportive interventions. Suicidal/Homicidal: No  Plan: Return again in  6 weeks.2.Review patient looking at TED talk video, practice social anxiety skills learned in session. 3.  Continue to work on distancing patient's from negative voice from the past, strengthen self-esteem work on social anxiety from friends social anxiety resources.  Diagnosis: Axis I: bipolar 1 disorder, most recent episode depressed, social phobia, alcohol use disorder moderate dependence, major depressive disorde, recurrent, severe    Axis II: No diagnosis    Cordella Register, LCSW 07/20/2021

## 2021-07-22 ENCOUNTER — Ambulatory Visit (INDEPENDENT_AMBULATORY_CARE_PROVIDER_SITE_OTHER): Payer: Medicare Other | Admitting: Pharmacist

## 2021-07-22 DIAGNOSIS — F32A Depression, unspecified: Secondary | ICD-10-CM

## 2021-07-22 DIAGNOSIS — E785 Hyperlipidemia, unspecified: Secondary | ICD-10-CM

## 2021-07-22 DIAGNOSIS — I10 Essential (primary) hypertension: Secondary | ICD-10-CM

## 2021-07-22 NOTE — Patient Instructions (Signed)
Visit Information  PATIENT GOALS:  Goals Addressed               This Visit's Progress     Patient Stated     Stop drinking so much (pt-stated)        Other     Chronic Care Management Pharmacy Care Plan   On track     CARE PLAN ENTRY (see longitudinal plan of care for additional care plan information)  Current Barriers:  Chronic Disease Management support, education, and care coordination needs related to Pre-DM, HTN, HLD, Asthma, Depression, Alcohol Use Disorder, Vitamin D Deficiency, Stomach Pain   Hypertension BP Readings from Last 3 Encounters:  05/04/21 116/63  11/17/20 114/65  08/25/20 (!) 148/65  Pharmacist Clinical Goal(s): Over the next 90 days, patient will work with PharmD and providers to maintain blood pressure goal <140/90 Current regimen:  Lisinopril '10mg'$  daily Interventions: Discussed blood pressure goal  Discussed medication adherence Reviewed home blood pressure readings Patient self care activities - Over the next 90 days, patient will: Maintain hypertension medication regimen. Continue to check blood pressure 1 or 2 times per week and record  Cholesterol Lipid Panel     Component Value Date/Time   CHOL 216 (H) 05/04/2021 1424   TRIG 82.0 05/04/2021 1424   HDL 90.90 05/04/2021 1424   CHOLHDL 2 05/04/2021 1424   VLDL 16.4 05/04/2021 1424   LDLCALC 108 (H) 05/04/2021 1424   Pharmacist Clinical Goal(s): Over the next 90 days, patient will work with PharmD and providers to maintain LDL goal < 100 Current regimen:  None Interventions: Discussed cholesterol goals for LDL, HDL and Tg Patient self care activities - Over the next 90 days, patient will: Maintain LDL <100  Pre-Diabetes Lab Results  Component Value Date/Time   HGBA1C 6.1 05/04/2021 02:24 PM   HGBA1C 5.7 (H) 11/20/2011 11:59 AM  Pharmacist Clinical Goal(s): Over the next 90 days, patient will work with PharmD and providers to maintain A1c goal <6.5% Current regimen:  Diet and  exercise management  Interventions: Discussed A1c goal Recommend checking A1c with next labs Patient self care activities - Over the next 90 days, patient will: Maintain a1c <6.5%   Alcohol Use Disorder Pharmacist Clinical Goal(s) Over the next 90 days, patient will work with PharmD and providers to reduce alcohol consumptoms Current regimen:  Camprel '333mg'$  twice daily  Interventions: Encouraged patient to continue to take Camprel and avoid alcohol Patient self care activities - Over the next 90 days, patient will: Continue to follow up with Dr De Nurse and therapist  Contact providers with needed support  Mood / anxiety:  Pharmacist Clinical Goal(s): Over the next 90 days, patient will work with PharmD and providers to improve mood and anxiety Current treatment: Lithium 300g twice a day Paroxetine '30mg'$  daily Bupropion '100mg'$  - take 2 tablets = '200mg'$  twice a day Interventions Discussed to take second dose of bupropion before 4pm if she is having trouble sleeping and taking bupropion later in day can affect sleep.  Patient self care activities - Over the next 90 days, patient will: Continue current medication regimen Continue regular follow up with Dr De Nurse and therapist  Medication management Pharmacist Clinical Goal(s): Over the next 90 days, patient will work with PharmD and providers to maintain optimal medication adherence Current pharmacy: [facility] Interventions Comprehensive medication review performed. Medication list updated.  Reviewed medication refill history and discussed adherence.  Coordinate with [facility] to fill needed maintenance meds and to synchronize refills  Discussed getting  annual flu vaccine and COVID booster - patient is planning to get when she is in office 08/03/2021 Consider Shingrix vaccine (recommended she get late fall or early 2023 since she will get other vaccines soon Continue to f/u with GI and get colonoscopy as recommended Patient self  care activities - Over the next 90 days, patient will: Continue current medication management strategy [facility] will be able to fill all your maintenance medications except lisinopril (it is not due until October) but they will work lisinopril in to be synchronized with all the others on the next 90 day cycle.   Patient self care activities - Over the next 90 days, patient will: Focus on medication adherence by filling and taking medications appropriately  Report any questions or concerns to PharmD and/or provider(s) Recommend increase physical activity with goal of 150 minutes of moderate physical activity per week  Please see past updates related to this goal by clicking on the "Past Updates" button in the selected goal          Patient verbalizes understanding of instructions provided today and agrees to view in [place].   Telephone follow up appointment with care management team member scheduled for: 6 months  Cherre Robins, PharmD Clinical Pharmacist [place] [facility]

## 2021-07-22 NOTE — Chronic Care Management (AMB) (Signed)
Chronic Care Management Pharmacy Note  07/22/2021 Name:  Julie Powell MRN:  [id number] DOB:  03-30-1961  Subjective: Julie Powell is an 60 y.o. year old female who is a primary patient of [name], NP.  The CCM team was consulted for assistance with disease management and care coordination needs.    Engaged with patient by telephone for follow up visit in response to provider referral for pharmacy case management and/or care coordination services.   Consent to Services:  The patient was given information about Chronic Care Management services, agreed to services, and gave verbal consent prior to initiation of services.  Please see initial visit note for detailed documentation.   Patient Care Team: [name], NP as PCP - General (Internal Medicine) Merian Capron, MD as Consulting Physician (Psychiatry) Renato Shin, MD as Consulting Physician (Endocrinology) Lucienne Capers, MD as Consulting Physician (Gastroenterology) Cameron Sprang, MD as Consulting Physician (Neurology) Cherre Robins, PharmD (Pharmacist)  Recent office visits: No recent office visits with PCP  Recent consult visits: 06/30/2021 - LCSW - [facility] for behavioral health Jule Ser)  02/02/2021 - Dr De Nurse - Behavioral health. Video Visit. Started Campral 370m twice a day  Hospital visits:  None in previous 6 months  Objective:  Lab Results  Component Value Date   CREATININE 1.00 05/04/2021   CREATININE 0.94 11/17/2020   CREATININE 0.88 08/25/2020    Lab Results  Component Value Date   HGBA1C 6.1 05/04/2021       Component Value Date/Time   CHOL 216 (H) 05/04/2021 1424   TRIG 82.0 05/04/2021 1424   HDL 90.90 05/04/2021 1424   CHOLHDL 2 05/04/2021 1424   VLDL 16.4 05/04/2021 1424   LDLCALC 108 (H) 05/04/2021 1424    Hepatic Function Latest Ref Rng & Units 05/04/2021 08/25/2020 02/03/2020  Total Protein 6.0 - 8.3 g/dL 6.8 6.8 6.8  Albumin 3.5 - 5.2 g/dL 4.0 -  4.0  AST 0 - 37 U/L 17 115(H) 18  ALT 0 - 35 U/L 18 93(H) 14  Alk Phosphatase 39 - 117 U/L 107 - 157(H)  Total Bilirubin 0.2 - 1.2 mg/dL 0.6 0.6 0.6  Bilirubin, Direct 0.0 - 0.3 mg/dL - - -    Lab Results  Component Value Date/Time   TSH 2.77 05/04/2021 02:24 PM   TSH 4.10 08/25/2020 12:06 PM   FREET4 0.70 09/22/2015 10:21 AM    CBC Latest Ref Rng & Units 11/17/2020 08/25/2020 11/25/2018  WBC 4.0 - 10.5 K/uL 7.6 4.7 5.2  Hemoglobin 12.0 - 15.0 g/dL 13.4 13.9 12.5  Hematocrit 36.0 - 46.0 % 40.9 41.9 37.4  Platelets 150 - 400 K/uL 269 229 233.0    Lab Results  Component Value Date/Time   VD25OH 34.55 05/04/2021 02:24 PM   VD25OH 32.21 03/22/2016 05:35 PM    Clinical ASCVD: No  The 10-year ASCVD risk score (Arnett DK, et al., 2019) is: 5.1%   Values used to calculate the score:     Age: [age]years     Sex: Female     Is Non-Hispanic African American: Yes     Diabetic: No     Tobacco smoker: No     Systolic Blood Pressure: 1545mmHg     Is BP treated: Yes     HDL Cholesterol: 90.9 mg/dL     Total Cholesterol: 216 mg/dL    Other: See below  Social History   Tobacco Use  Smoking Status Former   Packs/day: 0.50   Years: 10.00  Pack years: 5.00   Types: Cigarettes   Quit date: 11/07/1991   Years since quitting: 29.7  Smokeless Tobacco Never   BP Readings from Last 3 Encounters:  05/04/21 116/63  11/17/20 114/65  08/25/20 (!) 148/65   Pulse Readings from Last 3 Encounters:  05/04/21 84  11/17/20 86  08/25/20 78   Wt Readings from Last 3 Encounters:  05/04/21 272 lb (123.4 kg)  04/18/21 277 lb (125.6 kg)  11/17/20 279 lb 6.4 oz (126.7 kg)    Assessment: Review of patient past medical history, allergies, medications, health status, including review of consultants reports, laboratory and other test data, was performed as part of comprehensive evaluation and provision of chronic care management services.    SDOH:  (Social Determinants of Health) assessments and  interventions performed:     CCM Care Plan  No Known Allergies  Medications Reviewed Today     Reviewed by Cherre Robins, PharmD (Pharmacist) on 07/22/21 at [place] List Status: <None>   Medication Order Taking? Sig Documenting Provider Last Dose Status Informant  acamprosate (CAMPRAL) 333 MG tablet [id number] Yes Take 1 tablet (333 mg total) by mouth in the morning and at bedtime. Merian Capron, MD Taking Active   albuterol [facility] HFA) 108 [phone] Base) MCG/ACT inhaler [id number] Yes Inhale 2 puffs into the lungs every 6 (six) hours as needed for wheezing or shortness of breath. [name], NP Taking Active   buPROPion [facility]) 100 MG tablet [id number] Yes TAKE 2 BID  Patient taking differently: Take 200 mg by mouth 2 (two) times daily.   Merian Capron, MD Taking Active   cholecalciferol (VITAMIN D) 1000 UNITS tablet [id number] Yes Take 1,000 Units by mouth 3 (three) times a week.  [provider] Taking Active   colchicine 0.6 MG tablet [id number] Yes Take 2 tabs by mouth now and 1 tab in 1 hour. May repeat tomorrow if gout symptoms do not improve. [name], NP Taking Active   hyoscyamine (LEVBID) 0.375 MG 12 hr tablet [id number] Yes Take 0.375 mg by mouth daily. [provider] Taking Active            Med Note Dorrene German   Tue Dec 21, 2015 10:47 AM) Received from: External Pharmacy  lisinopril (ZESTRIL) 10 MG tablet [id number] Yes TAKE 1 TABLET(10 MG) BY MOUTH DAILY [name], NP Taking Active   lithium carbonate (LITHOBID) 300 MG CR tablet [id number] Yes BID  Patient taking differently: Take 300 mg by mouth 2 (two) times daily.   Merian Capron, MD Taking Active   Multiple Vitamin (MULTIVITAMIN) tablet [id number] No Take 1 tablet by mouth 3 (three) times a week.   Patient not taking: Reported on 07/22/2021   [provider] Not Taking Active   PARoxetine (PAXIL) 30 MG tablet [id number] Yes ONE A Tyler Aas, MD Taking  Active   vitamin B-12 (CYANOCOBALAMIN) 500 MCG tablet [id number] Yes Take 500 mcg by mouth. 2-3 times per week [provider] Taking Active   Zoster Vaccine Adjuvanted [facility]) injection [id number]  Inject 0.47m IM now and again in 2-6 months. ODebbrah Alar NP  Active             Patient Active Problem List   Diagnosis Date Noted   Hyperglycemia 05/06/2021   Acute gout involving toe of right foot 05/04/2021   Depression 04/07/2019   Plantar fasciitis 04/07/2019   Alcohol abuse 12/12/2016   Hyperlipidemia 05/13/2015   Bariatric surgery status 12/31/2014   Vitamin  B 12 deficiency 12/31/2014   Preventative health care 11/13/2014   Multinodular goiter 08/27/2014   Vitamin D deficiency 08/13/2014   Asthma 08/13/2014   Sciatica 08/13/2014   Major depressive disorder, recurrent, severe w/o psychotic behavior ([place]) 06/11/2013   Leiomyoma uteri 06/20/2011   Hypertension 05/23/2011   Obesity (BMI 30-39.9) 05/23/2011    Immunization History  Administered Date(s) Administered   Influenza,inj,Quad PF,6+ Mos 08/12/2014, 09/22/2015, 09/13/2016, 08/07/2017, 10/29/2018, 07/09/2019   PFIZER(Purple Top)SARS-COV-2 Vaccination 07/06/2020, 07/27/2020, 03/17/2021   PNEUMOCOCCAL CONJUGATE-20 05/04/2021   Pneumococcal Polysaccharide-23 08/29/2011   Tdap 11/13/2014    Conditions to be addressed/monitored: HTN, Anxiety, Depression, Bipolar Disorder and elevated LFTs; alcohol use disorder; social phobia; h/o gastric bypass in 2003  Care Plan : [place] (Adult)  Updates made by Cherre Robins, PHARMD since 07/22/2021 12:00 AM     Problem: Medication and disease state management: Pre-DM, HTN, Asthma, Depression, Alcohol Use Disorder, Vitamin D Deficiency, Stomach Pain; elevated LFTs   Priority: High  Onset Date: 03/01/2021  Note:   Current Barriers:  Chronic Disease Management support, education, and care coordination needs related to Pre-DM, HTN, HLD, Asthma, Depression,  Alcohol Use Disorder, Vitamin D Deficiency, Stomach Pain; increased LFTs Patient requests medications to be synchronized.   Pharmacist Clinical Goal(s):  Over the next 180 days, patient will achieve adherence to monitoring guidelines and medication adherence to achieve therapeutic efficacy maintain control of Blood pressure as evidenced by BP <140/90  Start Campral and stop alcohol use  through collaboration with PharmD and provider. (Patient has restarted Campral)   Interventions: 1:1 collaboration with [name], NP regarding development and update of comprehensive plan of care as evidenced by provider attestation and co-signature Inter-disciplinary care team collaboration (see longitudinal plan of care) Comprehensive medication review performed; medication list updated in electronic medical record  Hypertension (BP goal <140/90) BP Readings from Last 3 Encounters:  11/17/20 114/65  08/25/20 (!) 148/65  04/21/20 113/64  Controlled Current regimen:  Lisinopril 45m daily Checks blood pressure at home 2 to 3 times per week; reports blood pressure ranges 118-120's / 70-85 Interventions: Discussed blood pressure goal  Reviewed home blood pressure readings  Maintain hypertension medication regimen. Continue to check blood pressure 1 or 2 times per week and record  Cholesterol (LDL <100) Lipid Panel     Component Value Date/Time   CHOL 254 (H) 02/03/2020 1015   TRIG 72.0 02/03/2020 1015   HDL 156.50 02/03/2020 1015   CHOLHDL 2 02/03/2020 1015   VLDL 14.4 02/03/2020 1015   LDLCALC 83 02/03/2020 1015   At goal with out need for pharmacotherapy Current regimen:  None Interventions: Discussed cholesterol goals for LDL, HDL and Tg Maintain LDL <100  Pre-Diabetes(a1c goal <6.5%) Lab Results  Component Value Date/Time   HGBA1C 5.7 (H) 11/20/2011 11:59 AM  Current regimen:  Diet and exercise management  Interventions: Discussed A1c goal Recommend checking A1c with  next labs  Alcohol Use Disorder Improved Current regimen:  Campral 3325mtwice daily (patient started after last phone visit)  Managed by Dr AkDe Nurse last visit at behavioral health 06/30/2021 Interventions: Continue Camprel Continue to follow up with Dr AkDe Nursend therapist Contact providers with needed support  Mood / anxiety:  Pharmacist Clinical Goal(s): Over the next 90 days, patient will work with PharmD and providers to improve mood and anxiety Current treatment: Lithium 300g twice a day Paroxetine 3046maily Bupropion 100m68mtake 2 tablets = 200mg4mce a day Interventions Recommended she take second dose of  bupropion before 4pm if she is having trouble sleeping. Sometimes taking bupropion later in day can affect sleep.  Continue current medication regimen Continue regular follow up with Dr De Nurse and therapist  Medication management / Health Maintenance:  Current pharmacy: [facility] Patient is trying to get medication synced to fill all on 1 day with [facility]. Called [facility] and her next fill will contain all her maintenance medications except lisinopril (was just filled 05/22/2021 and not due until 10//2022). Added hyoscyamine to requested refills at patient request.  DEXA 04/03/2017 - T-Score at right femur was +0.1 and left forearm +1.6 Has not received Shingrix yet  Last COVID booster 03/2021 Due to get colonoscopy per GI but deferred at last appt. Has f/u in 1 year and GI states will readdress then. Interventions Comprehensive medication review performed. Medication list updated.  Coordinate with [facility] to fill needed maintenance meds and to synchronize refills  Discussed getting Shingrix Discussed getting COVID booster - patient is planning to get when she is in office 08/03/2021 Reminded to get annual flu vaccine - planning to get when in office 08/03/2021 Continue to f/u with GI and get colonoscopy as recommended Reviewed medication refill history and  discussed adherence.   Patient Goals/Self-Care Activities Over the next 180 days, patient will:  take medications as prescribed, check blood pressure 1 to 2 times per week, document, and provide at future appointments, target a minimum of 150 minutes of moderate intensity exercise weekly, and consider getting recommended vaccines, labs and procedure discussed above.  Follow Up Plan: Telephone follow up appointment with care management team member scheduled for:  6 months      Medication Assistance: None required.  Patient affirms current coverage meets needs.  Patient's preferred pharmacy is:  [id number] - [place], [place] - [address] [place] [address] [place] [address] Phone: [phone] Fax: [phone]   Follow Up:  Patient agrees to Care Plan and Follow-up.  Plan: Telephone follow up appointment with care management team member scheduled for:  6 months  Cherre Robins, PharmD Clinical Pharmacist [place] [facility]

## 2021-08-03 ENCOUNTER — Ambulatory Visit: Payer: Medicare Other | Admitting: Family

## 2021-08-05 DIAGNOSIS — I10 Essential (primary) hypertension: Secondary | ICD-10-CM | POA: Diagnosis not present

## 2021-08-05 DIAGNOSIS — E785 Hyperlipidemia, unspecified: Secondary | ICD-10-CM

## 2021-08-05 DIAGNOSIS — F32A Depression, unspecified: Secondary | ICD-10-CM

## 2021-08-21 ENCOUNTER — Other Ambulatory Visit (HOSPITAL_COMMUNITY): Payer: Self-pay | Admitting: Psychiatry

## 2021-08-24 ENCOUNTER — Other Ambulatory Visit: Payer: Self-pay

## 2021-08-24 ENCOUNTER — Telehealth (INDEPENDENT_AMBULATORY_CARE_PROVIDER_SITE_OTHER): Payer: Medicare Other | Admitting: Psychiatry

## 2021-08-24 ENCOUNTER — Encounter (HOSPITAL_COMMUNITY): Payer: Self-pay | Admitting: Psychiatry

## 2021-08-24 DIAGNOSIS — F4010 Social phobia, unspecified: Secondary | ICD-10-CM | POA: Diagnosis not present

## 2021-08-24 DIAGNOSIS — F3130 Bipolar disorder, current episode depressed, mild or moderate severity, unspecified: Secondary | ICD-10-CM

## 2021-08-24 DIAGNOSIS — F1020 Alcohol dependence, uncomplicated: Secondary | ICD-10-CM

## 2021-08-24 NOTE — Progress Notes (Signed)
Patient ID: Julie Powell, female   DOB: May 04, 1961, 60 y.o.   MRN: [id number] [place] Follow-up Outpatient Visit  Julie Powell 06-10-1961     Virtual Visit via Telephone Note  I connected with Julie Powell on 08/24/21 at  1:00 PM EDT by telephone and verified that I am speaking with the correct person using two identifiers.  Location: Patient: home Provider: home office   I discussed the limitations, risks, security and privacy concerns of performing an evaluation and management service by telephone and the availability of in person appointments. I also discussed with the patient that there may be a patient responsible charge related to this service. The patient expressed understanding and agreed to proceed.      I discussed the assessment and treatment plan with the patient. The patient was provided an opportunity to ask questions and all were answered. The patient agreed with the plan and demonstrated an understanding of the instructions.   The patient was advised to call back or seek an in-person evaluation if the symptoms worsen or if the condition fails to improve as anticipated.  I provided 12 minutes of non-face-to-face time during this encounter.     CC  depression follow up  HPI Comments: Julie Powell is a 60   Y/O woman with a past psychiatric history Major Depressive Disorder, severe, recurrent. Mood disorder due to [facility], Alcohol use disorder and Social Phobia.   Patient remained sober for the last 3 to 4 months energy level and motivation is improved.  She is trying to work on negative associations with alcohol to keep away.  Campral is helping as well She is in therapy as well  Lithium level reviewed Depression improved   Aggravating factor: Loneliness modifying factor: housing son   Duration: since young age  Timing: more so in evening   Context: alcohol use history\   Associated signs and symptoms: Denies psychosis   Traumatic Brain  Injury: No none   Review of Systems  Cardiovascular:  Negative for chest pain.  Skin:  Negative for itching.  Neurological:  Negative for headaches.  Psychiatric/Behavioral:  Negative for suicidal ideas.   There were no vitals filed for this visit.  Physical Exam  Constitutional: She appears well-developed and well-nourished. No distress.  Skin: She is not diaphoretic.  No tremors of hands noted while patient was interviewed.     Past Medical History: Reviewed  Past Medical History:  Diagnosis Date   Anxiety    Asthma    B12 deficiency    Bipolar 1 disorder ([place]) 2010   ect   Fibroids    Hyperglycemia    Hypertension    Plantar fasciitis 2013   Sciatica    RT hip   Vitamin D deficiency     Allergies: No Known Allergies   Current Medications: Reviewed   Current Outpatient Medications on File Prior to Visit  Medication Sig Dispense Refill   acamprosate (CAMPRAL) 333 MG tablet TAKE 1 TABLET(333 MG) BY MOUTH IN THE MORNING AND AT BEDTIME 60 tablet 0   albuterol (PROAIR HFA) 108 (90 Base) MCG/ACT inhaler Inhale 2 puffs into the lungs every 6 (six) hours as needed for wheezing or shortness of breath. 20.1 g 1   buPROPion (WELLBUTRIN) 100 MG tablet TAKE 2 BID (Patient taking differently: Take 200 mg by mouth 2 (two) times daily.) 360 tablet 0   cholecalciferol (VITAMIN D) 1000 UNITS tablet Take 1,000 Units by mouth 3 (three) times a week.  colchicine 0.6 MG tablet Take 2 tabs by mouth now and 1 tab in 1 hour. May repeat tomorrow if gout symptoms do not improve. 6 tablet 0   hyoscyamine (LEVBID) 0.375 MG 12 hr tablet Take 0.375 mg by mouth daily.  0   lisinopril (ZESTRIL) 10 MG tablet TAKE 1 TABLET(10 MG) BY MOUTH DAILY 90 tablet 1   lithium carbonate (LITHOBID) 300 MG CR tablet BID (Patient taking differently: Take 300 mg by mouth 2 (two) times daily.) 180 tablet 0   Multiple Vitamin (MULTIVITAMIN) tablet Take 1 tablet by mouth 3 (three) times a week.  (Patient not taking:  Reported on 07/22/2021)     PARoxetine (PAXIL) 30 MG tablet ONE A DAY 90 tablet 0   vitamin B-12 (CYANOCOBALAMIN) 500 MCG tablet Take 500 mcg by mouth. 2-3 times per week     Zoster Vaccine Adjuvanted [facility]) injection Inject 0.5mg  IM now and again in 2-6 months. 0.5 mL 1   No current facility-administered medications on file prior to visit.      Family History: Reviewed  Family History   Problem  Relation  Age of Onset     Adopted: Yes     Aneurysm  Father     Psychiatric Specialty Exam:  Objective: Appearance:   Eye Contact::   Speech: Clear and Coherent and Normal Rate   Volume: Normal   Mood fair  Affect:  Thought Process: Goal Directed, Logical and Loose   Orientation: Full   Thought Content: WDL   Suicidal Thoughts: Patient denies   Homicidal Thoughts: Patient denies   Judgement: Fair   Insight: Fair   Psychomotor Activity: Normal   Akathisia: No   Handed: Right   Memory: Immediate 3/3, Recent: 1/3   [place] of knowledge-average to above average.  AIMS (if indicated): Not indicated at this time.   Assets: Communication Skills  Desire for Improvement    Assessment:  AXIS I: Major Depressive Disorder, ; GAD: insomnia: Alcohol use disorder Treatment Plan/Recommendations:   Plan of Care:        Mar[place] Kitchen   Medications: Prior documentation reviewed  Bipolar depression: Doing better continue lithium and Wellbutrin she is also on Paxil motivation has been better   Anxiety: Better continue Paxil  alcohol use: Remains sober continue Campral discussed relapse prevention  Fu 3 months or earlier             Merian Capron, M.D.  08/24/2021 1:06 PM

## 2021-08-29 ENCOUNTER — Other Ambulatory Visit (HOSPITAL_BASED_OUTPATIENT_CLINIC_OR_DEPARTMENT_OTHER): Payer: Self-pay | Admitting: Family

## 2021-08-29 ENCOUNTER — Other Ambulatory Visit: Payer: Self-pay | Admitting: Family

## 2021-08-29 DIAGNOSIS — Z1231 Encounter for screening mammogram for malignant neoplasm of breast: Secondary | ICD-10-CM

## 2021-08-31 ENCOUNTER — Ambulatory Visit (INDEPENDENT_AMBULATORY_CARE_PROVIDER_SITE_OTHER): Payer: Medicare Other | Admitting: Family

## 2021-08-31 ENCOUNTER — Other Ambulatory Visit: Payer: Self-pay

## 2021-08-31 ENCOUNTER — Encounter: Payer: Self-pay | Admitting: Family

## 2021-08-31 ENCOUNTER — Ambulatory Visit (INDEPENDENT_AMBULATORY_CARE_PROVIDER_SITE_OTHER): Payer: Medicare Other | Admitting: Licensed Clinical Social Worker

## 2021-08-31 VITALS — BP 122/62 | HR 71 | Temp 98.5°F | Resp 16 | Wt 270.0 lb

## 2021-08-31 DIAGNOSIS — F1011 Alcohol abuse, in remission: Secondary | ICD-10-CM | POA: Diagnosis not present

## 2021-08-31 DIAGNOSIS — I10 Essential (primary) hypertension: Secondary | ICD-10-CM | POA: Diagnosis not present

## 2021-08-31 DIAGNOSIS — F4010 Social phobia, unspecified: Secondary | ICD-10-CM | POA: Diagnosis not present

## 2021-08-31 DIAGNOSIS — E559 Vitamin D deficiency, unspecified: Secondary | ICD-10-CM

## 2021-08-31 DIAGNOSIS — M65339 Trigger finger, unspecified middle finger: Secondary | ICD-10-CM | POA: Diagnosis not present

## 2021-08-31 DIAGNOSIS — Z23 Encounter for immunization: Secondary | ICD-10-CM | POA: Diagnosis not present

## 2021-08-31 DIAGNOSIS — F3130 Bipolar disorder, current episode depressed, mild or moderate severity, unspecified: Secondary | ICD-10-CM

## 2021-08-31 DIAGNOSIS — F332 Major depressive disorder, recurrent severe without psychotic features: Secondary | ICD-10-CM

## 2021-08-31 DIAGNOSIS — E042 Nontoxic multinodular goiter: Secondary | ICD-10-CM | POA: Diagnosis not present

## 2021-08-31 DIAGNOSIS — E538 Deficiency of other specified B group vitamins: Secondary | ICD-10-CM

## 2021-08-31 DIAGNOSIS — F1020 Alcohol dependence, uncomplicated: Secondary | ICD-10-CM

## 2021-08-31 LAB — BASIC METABOLIC PANEL
BUN: 14 mg/dL (ref 6–23)
CO2: 25 mEq/L (ref 19–32)
Calcium: 9.8 mg/dL (ref 8.4–10.5)
Chloride: 107 mEq/L (ref 96–112)
Creatinine, Ser: 0.95 mg/dL (ref 0.40–1.20)
GFR: 66.26 mL/min (ref >60.00)
Glucose, Bld: 91 mg/dL (ref 70–99)
Potassium: 4.3 mEq/L (ref 3.5–5.1)
Sodium: 141 mEq/L (ref 135–145)

## 2021-08-31 NOTE — Addendum Note (Signed)
Addended by: Jiles Prows on: 08/31/2021 02:14 PM   Modules accepted: Orders

## 2021-08-31 NOTE — Assessment & Plan Note (Signed)
She remains sober. I have commended her on this.

## 2021-08-31 NOTE — Progress Notes (Signed)
Subjective:   By signing my name below, I, Julie Powell, attest that this documentation has been prepared under the direction and in the presence of [name] NP. 08/31/2021      Patient ID: Julie Powell, female    DOB: [birth year], 60 y.o.   MRN: [id number]  Chief Complaint  Patient presents with   Hypertension    Here for follow up   Depression    Here for follow up   Joint Pain    Complains of joint pain and cramping on both middle fingers.     Patient is in today for a office visit.   Cramping- She complains of cramping in the middle finger of both hands. When cramping they lock up and hurt. She thinks she may be dehydrated.  Hypertension- Her blood pressure is doing well during this visit. She has not taken her medication this morning. She continues taking 10 mg lisinopril dally PO and reports no new issues while taking it. She checks her blood pressure at home.  BP Readings from Last 3 Encounters:  08/31/21 122/62  05/04/21 116/63  11/17/20 114/65   Pulse Readings from Last 3 Encounters:  08/31/21 71  05/04/21 84  11/17/20 86   Depression- Her anxiety and depression has not improved nor worsened since her last visit. She continues seeing a psychiatrist.  Immunizations- He has received a flu vaccine during this visit. She is schedule to received her 2nd Covid-19 booster vaccine.  Alcohol- She continues sobriety at this time. She continues taking 333 mg campral 2x daily PO.    Health Maintenance Due  Topic Date Due   Zoster Vaccines- Shingrix (1 of 2) Never done   DEXA SCAN  04/04/2019   COVID-19 Vaccine (4 - Booster for [employer] series) 05/12/2021   INFLUENZA VACCINE  06/06/2021    Past Medical History:  Diagnosis Date   Anxiety    Asthma    B12 deficiency    Bipolar 1 disorder ([place]) 2010   ect   Fibroids    Hyperglycemia    Hypertension    Plantar fasciitis 2013   Sciatica    RT hip   Vitamin D deficiency     Past Surgical History:   Procedure Laterality Date   [place]   "thyroglycol duct cyst in neck" had dysphagia, was told benign   GASTRIC BYPASS  2003   MICRODISCECTOMY LUMBAR  09/11/14   L3-L4  Dr Saintclair Halsted   PILONIDAL CYST DRAINAGE  1982   REDUCTION MAMMAPLASTY     SP CHOLECYSTOMY  1986    Family History  Adopted: Yes  Problem Relation Age of Onset   Aneurysm Father    Alcohol abuse Father    Alcohol abuse Brother    Alcohol abuse Brother    Cancer Brother        prostate   Alcohol abuse Brother    Lung cancer Sister    Cancer Sister        lung   Thyroid disease Neg Hx    Diabetes Neg Hx    Hypertension Neg Hx     Social History   Socioeconomic History   Marital status: Divorced    Spouse name: Not on file   Number of children: 1   Years of education: Not on file   Highest education level: Bachelor's degree (e.g., BA, AB, BS)  Occupational History   Not on file  Tobacco Use   Smoking status:  Former    Packs/day: 0.50    Years: 10.00    Pack years: 5.00    Types: Cigarettes    Quit date: 11/07/1991    Years since quitting: 29.8   Smokeless tobacco: Never  Vaping Use   Vaping Use: Never used  Substance and Sexual Activity   Alcohol use: Not Currently   Drug use: Not Currently    Types: "Crack" cocaine    Comment: Caffeine- Carbonated beverage 16 ounces.   Sexual activity: Never    Birth control/protection: Post-menopausal  Other Topics Concern   Not on file  Social History Narrative   Lives with 38 year old son   She is on disability due to depression   In the past she has worked as a Education officer, community for Systems developer   Former Mudlogger   Completed bachelor's degree   Divorced.   Enjoys television   Right handed    Apartment 1st floor    Social Determinants of Health   Financial Resource Strain: Low Risk    Difficulty of Paying Living Expenses: Not very hard  Food Insecurity: No  Food Insecurity   Worried About Charity fundraiser in the Last Year: Never true   Ran Out of Food in the Last Year: Never true  Transportation Needs: No Transportation Needs   Lack of Transportation (Medical): No   Lack of Transportation (Non-Medical): No  Physical Activity: Inactive   Days of Exercise per Week: 0 days   Minutes of Exercise per Session: 0 min  Stress: No Stress Concern Present   Feeling of Stress : Not at all  Social Connections: Socially Isolated   Frequency of Communication with Friends and Family: More than three times a week   Frequency of Social Gatherings with Friends and Family: Once a week   Attends Religious Services: Never   Marine scientist or Organizations: No   Attends Music therapist: Never   Marital Status: Divorced  Human resources officer Violence: Not At Risk   Fear of Current or Ex-Partner: No   Emotionally Abused: No   Physically Abused: No   Sexually Abused: No    Outpatient Medications Prior to Visit  Medication Sig Dispense Refill   acamprosate (CAMPRAL) 333 MG tablet TAKE 1 TABLET(333 MG) BY MOUTH IN THE MORNING AND AT BEDTIME 60 tablet 0   albuterol (PROAIR HFA) 108 (90 Base) MCG/ACT inhaler Inhale 2 puffs into the lungs every 6 (six) hours as needed for wheezing or shortness of breath. 20.1 g 1   buPROPion (WELLBUTRIN) 100 MG tablet TAKE 2 BID (Patient taking differently: Take 200 mg by mouth 2 (two) times daily.) 360 tablet 0   cholecalciferol (VITAMIN D) 1000 UNITS tablet Take 1,000 Units by mouth 3 (three) times a week.      colchicine 0.6 MG tablet Take 2 tabs by mouth now and 1 tab in 1 hour. May repeat tomorrow if gout symptoms do not improve. 6 tablet 0   hyoscyamine (LEVBID) 0.375 MG 12 hr tablet Take 0.375 mg by mouth daily.  0   lisinopril (ZESTRIL) 10 MG tablet TAKE 1 TABLET(10 MG) BY MOUTH DAILY 90 tablet 1   lithium carbonate (LITHOBID) 300 MG CR tablet BID (Patient taking differently: Take 300 mg by mouth 2 (two)  times daily.) 180 tablet 0   Multiple Vitamin (MULTIVITAMIN) tablet Take 1 tablet by mouth 3 (three) times a week.     PARoxetine (PAXIL) 30  MG tablet ONE A DAY 90 tablet 0   vitamin B-12 (CYANOCOBALAMIN) 500 MCG tablet Take 500 mcg by mouth. 2-3 times per week     Zoster Vaccine Adjuvanted [facility]) injection Inject 0.5mg  IM now and again in 2-6 months. 0.5 mL 1   No facility-administered medications prior to visit.    No Known Allergies  Review of Systems  Musculoskeletal:        (+)painful cramping in both 3rd digits of hand  Psychiatric/Behavioral:  Positive for depression.       Objective:    Physical Exam Constitutional:      General: She is not in acute distress.    Appearance: Normal appearance. She is not ill-appearing.  HENT:     Head: Normocephalic and atraumatic.     Right Ear: External ear normal.     Left Ear: External ear normal.  Eyes:     Extraocular Movements: Extraocular movements intact.     Pupils: Pupils are equal, round, and reactive to light.  Neck:     Thyroid: Thyromegaly present.  Cardiovascular:     Rate and Rhythm: Normal rate and regular rhythm.     Heart sounds: Normal heart sounds. No murmur heard.   No gallop.  Pulmonary:     Effort: Pulmonary effort is normal. No respiratory distress.     Breath sounds: Normal breath sounds. No wheezing or rales.  Skin:    General: Skin is warm and dry.  Neurological:     Mental Status: She is alert and oriented to person, place, and time.  Psychiatric:        Behavior: Behavior normal.    BP 122/62 (BP Location: Right Arm, Patient Position: Sitting, Cuff Size: Large)   Pulse 71   Temp 98.5 F (36.9 C) (Oral)   Resp 16   Wt 270 lb (122.5 kg)   LMP 09/11/2013   SpO2 100%   BMI 46.35 kg/m  Wt Readings from Last 3 Encounters:  08/31/21 270 lb (122.5 kg)  05/04/21 272 lb (123.4 kg)  04/18/21 277 lb (125.6 kg)       Assessment & Plan:   Problem List Items Addressed This Visit        Unprioritized   Vitamin D deficiency    Last vitamin D Lab Results  Component Value Date   VD25OH 34.55 05/04/2021  Vitamin D level is normal on 1000 iu once daily.       Vitamin B 12 deficiency    Lab Results  Component Value Date   VITAMINB12 685 05/04/2021  Stable, continue oral b12 supplement.       Relevant Orders   Basic metabolic panel   Trigger middle finger - Primary    Sounds like mild trigger finger bilateral middle fingers. Discussed referral but she declines at this time. Will monitor.       Multinodular goiter    Lab Results  Component Value Date   TSH 2.77 05/04/2021  Last thyroid US 6/21 noted the following:  Stable thyroid heterogeneity and subcentimeter nodules.   Monitor clinically.       Hypertension    BP Readings from Last 3 Encounters:  08/31/21 (!) 162/62  05/04/21 116/63  11/17/20 114/65  She has not yet taken her lisinopril 10mg .  She will check at home tomorrow and send me her reading via my chart.       History of alcohol abuse    She remains sober. I have commended her on this.  No orders of the defined types were placed in this encounter.   I, [name] NP, personally preformed the services described in this documentation.  All medical record entries made by the scribe were at my direction and in my presence.  I have reviewed the chart and discharge instructions (if applicable) and agree that the record reflects my personal performance and is accurate and complete. 08/31/2021   I,Julie Powell,acting as a Education administrator for Nance Pear, NP.,have documented all relevant documentation on the behalf of Nance Pear, NP,as directed by  Nance Pear, NP while in the presence of Nance Pear, NP.   Nance Pear, NP

## 2021-08-31 NOTE — Assessment & Plan Note (Signed)
Last vitamin D Lab Results  Component Value Date   VD25OH 34.55 05/04/2021   Vitamin D level is normal on 1000 iu once daily.

## 2021-08-31 NOTE — Assessment & Plan Note (Signed)
BP Readings from Last 3 Encounters:  08/31/21 (!) 162/62  05/04/21 116/63  11/17/20 114/65   She has not yet taken her lisinopril 10mg .  She will check at home tomorrow and send me her reading via my chart.

## 2021-08-31 NOTE — Progress Notes (Signed)
Virtual Visit via Telephone Note  I connected with Julie Powell on 08/31/21 at  3:00 PM EDT by telephone and verified that I am speaking with the correct person using two identifiers.  Location: Patient: home Provider: home office   I discussed the limitations, risks, security and privacy concerns of performing an evaluation and management service by telephone and the availability of in person appointments. I also discussed with the patient that there may be a patient responsible charge related to this service. The patient expressed understanding and agreed to proceed.   I discussed the assessment and treatment plan with the patient. The patient was provided an opportunity to ask questions and all were answered. The patient agreed with the plan and demonstrated an understanding of the instructions.   The patient was advised to call back or seek an in-person evaluation if the symptoms worsen or if the condition fails to improve as anticipated.  I provided 20 minutes of non-face-to-face time during this encounter.  THERAPIST PROGRESS NOTE  Session Time: 3:00 PM to 3:20 PM  Participation Level: Active  Behavioral Response: CasualAlertappropriate  Type of Therapy: Individual Therapy  Treatment Goals addressed:  Continued sobriety, social anxiety, reestablished all relationships with family and friends, work on health goals work on being less self-conscious about things Interventions: Solution Focused, Strength-based, Supportive, and Other: coping  Summary: Julie Powell is a 60 y.o. female who presents with hasn't made much progress with social anxiety too comfortable getting out but feels better. Hasn't challenged herself to go out, reach out to friends, hasn't made plans. Sometimes being home comfortable but other times feel bad not supposed to be, dissatisfied and lonely. Small amount of getting out. On occasion goes out with son. Doesn't do often because of COVID. Goes to each other  house not frequently. Can't set a goal this minute but tomorrow maybe can. Today out of it and doesn't care. Knows tomorrow or the next day will.  Noted setting goals when she feels like it put it in her hands at this point.  Reviewed helpfulness of therapy patient does not want to have ongoing therapy thinks 1 or 2 more sessions give her the boost that needed think getting it but would like to talk one or two more times.  Want to engage in psychoeducation, therapy session Short patient can call if she needs therapist to set up a earlier appointment  Therapist reviewed symptoms, facilitated expression of thoughts and feeling reviewed coping skills patient has use noted limited application and reviewed impact for patient.  There is a positive aspect of being comfortable at home although once in a while she feels bad for not going out, knowing that would be good for her.  Discussed strategy of setting goal patient did not want to do that today, therapist offered suggestions such as going up to eat with son or going over to each other's house, left hip patient can set it if she wants to as she relates tomorrow the next day she may have the motivation.  Overall patient shows progress through being satisfied at home most the time mood assessed as improved in general.  Therapist provided active listening, open questions, supportive interventions. Suicidal/Homicidal: No  Plan: Return again in 6 weeks.2.  Review progress with coping skills for symptoms such as social anxiety, distancing herself from negative voice, look at social anxiety [place] CBT, psychological tools, reviewed Clare Gandy talk for social anxiety  Diagnosis: Axis I: bipolar 1 disorder, most recent episode depressed, social phobia,  alcohol use disorder moderate dependence, major depressive disorde, recurrent, severe    Axis II: No diagnosis    Cordella Register, LCSW 08/31/2021

## 2021-08-31 NOTE — Patient Instructions (Addendum)
Please complete lab work prior to leaving.   

## 2021-08-31 NOTE — Assessment & Plan Note (Signed)
Lab Results  Component Value Date   RMBOBOFP69 249 05/04/2021   Stable, continue oral b12 supplement.

## 2021-08-31 NOTE — Assessment & Plan Note (Signed)
Lab Results  Component Value Date   TSH 2.77 05/04/2021   Last thyroid US 6/21 noted the following:  Stable thyroid heterogeneity and subcentimeter nodules.  Monitor clinically.

## 2021-08-31 NOTE — Assessment & Plan Note (Signed)
Sounds like mild trigger finger bilateral middle fingers. Discussed referral but she declines at this time. Will monitor.

## 2021-09-08 DIAGNOSIS — Z23 Encounter for immunization: Secondary | ICD-10-CM | POA: Diagnosis not present

## 2021-09-20 ENCOUNTER — Other Ambulatory Visit (HOSPITAL_COMMUNITY): Payer: Self-pay | Admitting: Psychiatry

## 2021-10-04 ENCOUNTER — Ambulatory Visit (HOSPITAL_BASED_OUTPATIENT_CLINIC_OR_DEPARTMENT_OTHER)
Admission: RE | Admit: 2021-10-04 | Discharge: 2021-10-04 | Disposition: A | Discharge status: Home / Self Care | Payer: Medicare Other | Source: Ambulatory Visit | Attending: Family | Admitting: Family

## 2021-10-04 ENCOUNTER — Other Ambulatory Visit: Payer: Self-pay

## 2021-10-04 DIAGNOSIS — Z1231 Encounter for screening mammogram for malignant neoplasm of breast: Secondary | ICD-10-CM | POA: Insufficient documentation

## 2021-10-07 ENCOUNTER — Telehealth: Payer: Self-pay | Admitting: Pharmacist

## 2021-10-07 NOTE — Chronic Care Management (AMB) (Signed)
    Chronic Care Management Pharmacy Assistant   Name: Elexa Kivi  MRN: [id number] DOB: 10-Dec-1960   Reason for Encounter: General  Recent office visits:  08/31/21-[name], NP (PCP) General follow up visit. Labs ordered. Follow up in 3 months.  Recent consult visits:  08/24/21-([facility]) Merian Capron, MD. Notes not available.  Hospital visits:  None in previous 6 months  Medications: Outpatient Encounter Medications as of 10/07/2021  Medication Sig Note   acamprosate (CAMPRAL) 333 MG tablet TAKE 1 TABLET(333 MG) BY MOUTH IN THE MORNING AND AT BEDTIME    albuterol (PROAIR HFA) 108 (90 Base) MCG/ACT inhaler Inhale 2 puffs into the lungs every 6 (six) hours as needed for wheezing or shortness of breath.    buPROPion (WELLBUTRIN) 100 MG tablet TAKE 2 BID (Patient taking differently: Take 200 mg by mouth 2 (two) times daily.)    cholecalciferol (VITAMIN D) 1000 UNITS tablet Take 1,000 Units by mouth 3 (three) times a week.     colchicine 0.6 MG tablet Take 2 tabs by mouth now and 1 tab in 1 hour. May repeat tomorrow if gout symptoms do not improve.    hyoscyamine (LEVBID) 0.375 MG 12 hr tablet Take 0.375 mg by mouth daily. 12/21/2015: Received from: External Pharmacy   lisinopril (ZESTRIL) 10 MG tablet TAKE 1 TABLET(10 MG) BY MOUTH DAILY    lithium carbonate (LITHOBID) 300 MG CR tablet BID (Patient taking differently: Take 300 mg by mouth 2 (two) times daily.)    Multiple Vitamin (MULTIVITAMIN) tablet Take 1 tablet by mouth 3 (three) times a week.    PARoxetine (PAXIL) 30 MG tablet ONE A DAY    vitamin B-12 (CYANOCOBALAMIN) 500 MCG tablet Take 500 mcg by mouth. 2-3 times per week    No facility-administered encounter medications on file as of 10/07/2021.   Have you had any problems recently with your health? Patient states she has been doing well with her blood pressure it's ranged 125/78. Patient states her mood has been stable.  Have you had any problems with  your pharmacy? Patient states she has no problems with her pharmacy.  What issues or side effects are you having with your medications? Patient  Patient states she has no issues or side effects with any of her medications.  What would you like me to pass along to [facility] for them to help you with?  Patient states she has been doing well with her blood pressure it ranged 125/78. Patient states her mood has been stable.  What can we do to take care of you better? Patient states there is nothing at this time.   Star Rating Drugs: Lisinopril 10 mg Last filled:07/29/21 90 DS  Myriam Elta [place], [place]

## 2021-10-12 ENCOUNTER — Ambulatory Visit (INDEPENDENT_AMBULATORY_CARE_PROVIDER_SITE_OTHER): Payer: Medicare Other | Admitting: Licensed Clinical Social Worker

## 2021-10-12 DIAGNOSIS — F3130 Bipolar disorder, current episode depressed, mild or moderate severity, unspecified: Secondary | ICD-10-CM | POA: Diagnosis not present

## 2021-10-12 DIAGNOSIS — F1020 Alcohol dependence, uncomplicated: Secondary | ICD-10-CM

## 2021-10-12 DIAGNOSIS — F4010 Social phobia, unspecified: Secondary | ICD-10-CM | POA: Diagnosis not present

## 2021-10-12 NOTE — Progress Notes (Signed)
Virtual Visit via Telephone Note  I connected with Julie Powell on 10/12/21 at  2:00 PM EST by telephone and verified that I am speaking with the correct person using two identifiers.  Location: Patient: home Provider: home office   I discussed the limitations, risks, security and privacy concerns of performing an evaluation and management service by telephone and the availability of in person appointments. I also discussed with the patient that there may be a patient responsible charge related to this service. The patient expressed understanding and agreed to proceed.   I discussed the assessment and treatment plan with the patient. The patient was provided an opportunity to ask questions and all were answered. The patient agreed with the plan and demonstrated an understanding of the instructions.   The patient was advised to call back or seek an in-person evaluation if the symptoms worsen or if the condition fails to improve as anticipated.  I provided 30 minutes of non-face-to-face time during this encounter.  THERAPIST PROGRESS NOTE  Session Time: 2:00 PM to 2:30 PM  Participation Level: Active  Behavioral Response: CasualAlertEuthymic  Type of Therapy: Individual Therapy  Treatment Goals addressed:  Continued sobriety, social anxiety, reestablished all relationships with family and friends, work on health goals work on being less self-conscious about things Interventions: Solution Focused, Strength-based, Supportive, and Other: self-esteem, coping  Summary: Julie Powell is a 60 y.o. female and therapist reviewed with her her progress and whether she wants to continue.  Patient said she reflected and her plan was for this to be the last session. She has been working on Issues with self-esteem trying to do things to help her feel better about herself and not worrying about going out. Try to do things like order clothes that haven't done in a long time feel better about how she  looks, get nails done, scented candles smell nice fragrances, things do to lift mood. Feels better feels she should continue to give herself these boosts, activities to help her perk up instead of laying in the bed and totally isolated. Even if not making friends still going out leaving the house without so much worry about feeling awful which makes her feel worse than feels. Looked at video to fix hair. Daily battle make a choice do better, and as long as alive will keep trying. Has to say some days does better at trying than others. Which mean to her for example not to call her son every day to get her food. Son can't believe it that she is trying to get herself together. Thinks son proud of her and happy and less guilty that he is not there everyday. Patient says he doesn't have to be there everyday not job to take care of her and that makes him feel better.  Therapist noted progress in saying relationship improve and patient agrees. Has gotten  together with him and girlfriend. She even went inside and had lunch enjoyed and felt welcome not as self-conscious as normally would be. Typically thoughts like will I be able to sit and get up take up too much space. Silly things don't need to think about and they are not thinking about it. No more drinking not going back to that. Think about it still. Not until past few years had problem before never had a problem with drinking. Talked about how it increased for her that she was bored, enjoyed it, it just kept building and building with more time spent doing it. That went on  for 3-4 years drinking all the time, getting the shakes thinking when will be her first drink. Remember that so don't get back, getting sick. Talks about the tempting part that it tastes good and feels good. But can't take care of herself if drinking, can't go anywhere, won't be able to get nails done. She realizes she is going in a direction making room for these things things will continue to  improve. Already see it. Feeling better.  Another thing helpful for her helps to write down to think about it and remember see it in writing quick to react to it. More like reminders of where she has been and what she can do. There are things she can do. Remind herself what she used to do used to be more vibrant and more involvement if did then then can do maybe not to that level can try.  Breast felt she had not done very much for patient but patient said helped to sound things off with therapist like jump start. If need reach out will call, hard for her to do, but try best to call if needs more sessions..   Therapist reviewed symptoms, facilitated expression of thoughts and feelings therapist input at beginning was reviewed helpfulness of therapy and whether to continue.  Patient identifies that therapist has been helpful as a sounding board her input with her experience and knowledge, also has a jumpstart for things she is trying to do to place does not need to continue to continue make progress on her own.  Note ways patient is working on her goals significantly working on her self esteem and activities that help her feel good about herself, self nurturing activities.  Noted that if she is feeling positive about herself it will help to eliminate the negative voices, helps additionally do believe in the positive because these are actual steps she is taking that are positive for her.  Note this would help when she is out socially if the positive voice is more predominant.  Therapist added we all have unconditional worth and that is a good foundation does not depend on anything but different ways that we can increase self-esteem different channels.  Things like affirmations, sense of accomplishment.  Noted this way patient is also doing that by writing things down as reminders for herself finding herself of her strengths, therapist pointed out she has had qualities she likes about herself and they do not go away  we still contain all the different experiences and achievements we have made so they are still there.  Is encouraged patient to work toward goal of not worrying what other people think and noted it can be a process that can happen in a short or long period over her lifetime.  Start to realize and and insignificance of others opinion unless there are people she cares about and values.  Therapist encouraged her to look to internal gauge for her sense of self not external does not define her.  Did significant treatment the patient has not been drinking noted that it invites more positive things in her life that we will give her more joy.  Noted helpful that patient remember some of the negative experiences as motivation to keep her away from using.  Noted positive developments as progress such as improve relationship with son and able to enjoy that more as positive things have occurred noted also patient making progress in terms of going to eat with son and girlfriend pushing herself out of comfort  zone as a positive step.  Discussed patient able to continue on her own if she is in need of therapist she will contact therapist.  Therapist provided active listening open questions, supportive interventions.  Suicidal/Homicidal: No  Plan: 1.patient ready to discontinue therapy and continue on her own with positive steps working on self-esteem but also has a positive effect on social anxiety, continue learning and applying helpful coping strategies   Diagnosis: Axis I: bipolar 1 disorder, most recent episode depressed, social phobia, alcohol use disorder moderate dependence, major depressive disorde, recurrent, severe     Axis II: No diagnosis    Cordella Register, LCSW 10/12/2021

## 2021-10-20 ENCOUNTER — Other Ambulatory Visit: Payer: Self-pay | Admitting: Family

## 2021-10-20 ENCOUNTER — Other Ambulatory Visit (HOSPITAL_COMMUNITY): Payer: Self-pay

## 2021-10-20 DIAGNOSIS — I10 Essential (primary) hypertension: Secondary | ICD-10-CM

## 2021-10-20 MED ORDER — LITHIUM CARBONATE ER 300 MG PO TBCR: EXTENDED_RELEASE_TABLET | ORAL | 0 refills | Status: DC

## 2021-11-14 ENCOUNTER — Telehealth (HOSPITAL_COMMUNITY): Payer: Self-pay

## 2021-11-14 MED ORDER — PAROXETINE HCL 30 MG PO TABS: ORAL_TABLET | ORAL | 0 refills | Status: DC

## 2021-11-14 MED ORDER — BUPROPION HCL 100 MG PO TABS
ORAL_TABLET | ORAL | 0 refills | Status: DC
Start: 2021-11-14 — End: 2022-03-08

## 2021-11-14 NOTE — Telephone Encounter (Signed)
Medication refill request - Fax refill requests for patient's prescribed 90 day orders of Paroxetine 30 mg tablets and Bupropion 100 mg tablets received from pt's [facility].  Both orders last filled for 90 days on 07/22/21 and pt returns 11/23/21.

## 2021-11-23 ENCOUNTER — Encounter (HOSPITAL_COMMUNITY): Payer: Self-pay | Admitting: Psychiatry

## 2021-11-23 ENCOUNTER — Telehealth (INDEPENDENT_AMBULATORY_CARE_PROVIDER_SITE_OTHER): Payer: Medicare Other | Admitting: Psychiatry

## 2021-11-23 DIAGNOSIS — F4010 Social phobia, unspecified: Secondary | ICD-10-CM

## 2021-11-23 DIAGNOSIS — F3130 Bipolar disorder, current episode depressed, mild or moderate severity, unspecified: Secondary | ICD-10-CM

## 2021-11-23 DIAGNOSIS — F1020 Alcohol dependence, uncomplicated: Secondary | ICD-10-CM | POA: Diagnosis not present

## 2021-11-23 NOTE — Progress Notes (Signed)
Patient ID: Julie Powell, female   DOB: [birth year], 61 y.o.   MRN: [id number] [place] Follow-up Outpatient Visit  Julie Powell Aug 08, 1961   Virtual Visit via Telephone Note  I connected with Julie Powell on 11/23/21 at  1:00 PM EST by telephone and verified that I am speaking with the correct person using two identifiers.  Location: Patient: home Provider: home office   I discussed the limitations, risks, security and privacy concerns of performing an evaluation and management service by telephone and the availability of in person appointments. I also discussed with the patient that there may be a patient responsible charge related to this service. The patient expressed understanding and agreed to proceed.      I discussed the assessment and treatment plan with the patient. The patient was provided an opportunity to ask questions and all were answered. The patient agreed with the plan and demonstrated an understanding of the instructions.   The patient was advised to call back or seek an in-person evaluation if the symptoms worsen or if the condition fails to improve as anticipated.  I provided 21 minutes of non-face-to-face time during this encounter including chart review, documentation       CC  depression follow up  HPI Comments: Julie Powell is a 61   Y/O woman with a past psychiatric history Major Depressive Disorder, severe, recurrent. Mood disorder due to [facility], Alcohol use disorder and Social Phobia.   Patient has remained sober , had some subdued days over the winter, gets weepy but feels meds are ok where they are Discussed relapse prevention'   Campral is helping as well She is in therapy but does feel connected so not doing it   Aggravating factor: lonliness modifying factor: housing,    Duration: since young age  Timing: more so in evening   Context: alcohol use history\   Associated signs and symptoms: Denies psychosis   Traumatic Brain  Injury: No none   Review of Systems  Cardiovascular:  Negative for palpitations.  Skin:  Negative for itching.  Neurological:  Negative for headaches.  Psychiatric/Behavioral:  Negative for suicidal ideas.   There were no vitals filed for this visit.  Physical Exam  Constitutional: She appears well-developed and well-nourished. No distress.  Skin: She is not diaphoretic.  No tremors of hands noted while patient was interviewed.     Past Medical History: Reviewed  Past Medical History:  Diagnosis Date   Anxiety    Asthma    B12 deficiency    Bipolar 1 disorder ([place]) 2010   ect   Fibroids    Hyperglycemia    Hypertension    Plantar fasciitis 2013   Sciatica    RT hip   Vitamin D deficiency     Allergies: No Known Allergies   Current Medications: Reviewed   Current Outpatient Medications on File Prior to Visit  Medication Sig Dispense Refill   acamprosate (CAMPRAL) 333 MG tablet TAKE 1 TABLET(333 MG) BY MOUTH IN THE MORNING AND AT BEDTIME 60 tablet 1   albuterol (PROAIR HFA) 108 (90 Base) MCG/ACT inhaler Inhale 2 puffs into the lungs every 6 (six) hours as needed for wheezing or shortness of breath. 20.1 g 1   buPROPion (WELLBUTRIN) 100 MG tablet TAKE 2 BID 360 tablet 0   cholecalciferol (VITAMIN D) 1000 UNITS tablet Take 1,000 Units by mouth 3 (three) times a week.      colchicine 0.6 MG tablet Take 2 tabs by mouth now and  1 tab in 1 hour. May repeat tomorrow if gout symptoms do not improve. 6 tablet 0   hyoscyamine (LEVBID) 0.375 MG 12 hr tablet Take 0.375 mg by mouth daily.  0   lisinopril (ZESTRIL) 10 MG tablet TAKE 1 TABLET(10 MG) BY MOUTH DAILY 90 tablet 1   lithium carbonate (LITHOBID) 300 MG CR tablet BID 180 tablet 0   Multiple Vitamin (MULTIVITAMIN) tablet Take 1 tablet by mouth 3 (three) times a week.     PARoxetine (PAXIL) 30 MG tablet ONE A DAY 90 tablet 0   vitamin B-12 (CYANOCOBALAMIN) 500 MCG tablet Take 500 mcg by mouth. 2-3 times per week     No  current facility-administered medications on file prior to visit.      Family History: Reviewed  Family History   Problem  Relation  Age of Onset     Adopted: Yes     Aneurysm  Father     Psychiatric Specialty Exam:  Objective: Appearance:   Eye Contact::   Speech: Clear and Coherent and Normal Rate   Volume: Normal   Mood fair somewhat subdued  Affect:  Thought Process: Goal Directed, Logical and Loose   Orientation: Full   Thought Content: WDL   Suicidal Thoughts: Patient denies   Homicidal Thoughts: Patient denies   Judgement: Fair   Insight: Fair   Psychomotor Activity: Normal   Akathisia: No   Handed: Right   Memory: Immediate 3/3, Recent: 1/3   [place] of knowledge-average to above average.  AIMS (if indicated): Not indicated at this time.   Assets: Communication Skills  Desire for Improvement      Plan of Care:        Marland Kitchen   Medications and Assessment : Prior documentation reviewed Bipolar depression: somewhat subdued but overall was doing fair, doesn't want to increase meds, continue wellbutrin, paxil, lithium   Anxiety: manageable on paxil  alcohol use: remains sober, discussed relapse prevention, continue camprall   Fu 3 months or earlier             Merian Capron, M.D.  11/23/2021 1:18 PM

## 2021-12-01 ENCOUNTER — Encounter: Payer: Self-pay | Admitting: Family Medicine

## 2021-12-01 ENCOUNTER — Ambulatory Visit (INDEPENDENT_AMBULATORY_CARE_PROVIDER_SITE_OTHER): Payer: Medicare Other | Admitting: Family Medicine

## 2021-12-01 VITALS — BP 138/90 | HR 65 | Temp 98.1°F | Ht 64.0 in | Wt 274.8 lb

## 2021-12-01 DIAGNOSIS — R109 Unspecified abdominal pain: Secondary | ICD-10-CM

## 2021-12-01 DIAGNOSIS — R1032 Left lower quadrant pain: Secondary | ICD-10-CM

## 2021-12-01 LAB — POCT URINALYSIS DIP (MANUAL ENTRY)
Blood, UA: NEGATIVE
Glucose, UA: NEGATIVE mg/dL
Ketones, POC UA: NEGATIVE mg/dL
Leukocytes, UA: NEGATIVE
Nitrite, UA: NEGATIVE
Protein Ur, POC: NEGATIVE mg/dL
Spec Grav, UA: 1.02 (ref 1.010–1.025)
Urobilinogen, UA: 0.2 E.U./dL
pH, UA: 6 (ref 5.0–8.0)

## 2021-12-01 LAB — CBC AND DIFFERENTIAL
HCT: 35 — AB (ref 36–46)
Hemoglobin: 11.6 — AB (ref 12.0–16.0)
Neutrophils Absolute: 2913
Platelets: 248 (ref 150–399)
WBC: 5.7

## 2021-12-01 LAB — HEPATIC FUNCTION PANEL
ALT: 13 (ref 7–35)
AST: 19 (ref 13–35)
Alkaline Phosphatase: 135 — AB (ref 25–125)
Bilirubin, Total: 0.3

## 2021-12-01 LAB — CBC: RBC: 3.9 (ref 3.87–5.11)

## 2021-12-01 LAB — BASIC METABOLIC PANEL
BUN: 20 (ref 4–21)
CO2: 25 — AB (ref 13–22)
Chloride: 105 (ref 99–108)
Creatinine: 1.1 (ref 0.5–1.1)
Glucose: 82
Potassium: 4.6 (ref 3.4–5.3)
Sodium: 137 (ref 137–147)

## 2021-12-01 LAB — COMPREHENSIVE METABOLIC PANEL
Albumin: 4.1 (ref 3.5–5.0)
Calcium: 9.7 (ref 8.7–10.7)
Globulin: 2.7

## 2021-12-01 NOTE — Patient Instructions (Addendum)
Blood work today CT abdomen

## 2021-12-01 NOTE — Progress Notes (Signed)
Acute Office Visit  Subjective:    Patient ID: Julie Powell, female    DOB: 1960/11/29, 61 y.o.   MRN: [id number]  Chief Complaint  Patient presents with   left flank pain    Going on for 10 days. Happens everytime when she eats  Constant nagging pain- lasting about an hour     hot flash spells    This comes with dizziness and can hear a electrical pulse through eyes and ears. Going on 10 days as well.  Doesn't last long but has been happening BID.     HPI Patient is in today for left lower back pain with flushing and dizziness.   Patient reports that for the past 10 days every time after eating she has some left-sided lower back pain that is up to 8 out of 10 and takes about an hour to resolve.  She has not found anything to make the pain go away any quicker.  She denies any constipation, last bowel movement was today.  She has had some intermittent diarrhea but thinks it is mostly related to if she eats dairy.  Reports when the pain is happening she feels tense/jittery throughout her whole body although not visibly shaking.  She also has a flushed/hot flash feeling that lasts for several minutes before resolving.  She denies any abdominal pain, trouble urinating, dysuria, hematuria, urinary frequency/urgency, odor, constipation, blood in stool, fevers. Additionally she reports that for about the same amount of time she has had some occasional dizziness and a strange electrical type feeling when she blinks at times.  States she will get a slight spinning sensation if she changes positions too quickly but this does not occur with rolling over in bed or moving her head rapidly side to side.  She denies any other vision changes, headaches, syncope, vomiting, nausea, chest pain.      Past Medical History:  Diagnosis Date   Anxiety    Asthma    B12 deficiency    Bipolar 1 disorder ([place]) 2010   ect   Fibroids    Hyperglycemia    Hypertension    Plantar fasciitis 2013   Sciatica     RT hip   Vitamin D deficiency     Past Surgical History:  Procedure Laterality Date   [place]   "thyroglycol duct cyst in neck" had dysphagia, was told benign   GASTRIC BYPASS  2003   MICRODISCECTOMY LUMBAR  09/11/14   L3-L4  Dr Saintclair Halsted   PILONIDAL CYST DRAINAGE  1982   REDUCTION MAMMAPLASTY     SP CHOLECYSTOMY  1986    Family History  Adopted: Yes  Problem Relation Age of Onset   Aneurysm Father    Alcohol abuse Father    Alcohol abuse Brother    Alcohol abuse Brother    Cancer Brother        prostate   Alcohol abuse Brother    Lung cancer Sister    Cancer Sister        lung   Thyroid disease Neg Hx    Diabetes Neg Hx    Hypertension Neg Hx     Social History   Socioeconomic History   Marital status: Divorced    Spouse name: Not on file   Number of children: 1   Years of education: Not on file   Highest education level: Bachelor's degree (e.g., BA, AB, BS)  Occupational History   Not on  file  Tobacco Use   Smoking status: Former    Packs/day: 0.50    Years: 10.00    Pack years: 5.00    Types: Cigarettes    Quit date: 11/07/1991    Years since quitting: 30.0   Smokeless tobacco: Never  Vaping Use   Vaping Use: Never used  Substance and Sexual Activity   Alcohol use: Not Currently   Drug use: Not Currently    Types: "Crack" cocaine    Comment: Caffeine- Carbonated beverage 16 ounces.   Sexual activity: Never    Birth control/protection: Post-menopausal  Other Topics Concern   Not on file  Social History Narrative   Lives with 48 year old son   She is on disability due to depression   In the past she has worked as a Education officer, community for Systems developer   Former Mudlogger   Completed bachelor's degree   Divorced.   Enjoys television   Right handed    Apartment 1st floor    Social Determinants of Health   Financial Resource Strain: Low Risk    Difficulty  of Paying Living Expenses: Not very hard  Food Insecurity: No Food Insecurity   Worried About Charity fundraiser in the Last Year: Never true   Ran Out of Food in the Last Year: Never true  Transportation Needs: No Transportation Needs   Lack of Transportation (Medical): No   Lack of Transportation (Non-Medical): No  Physical Activity: Inactive   Days of Exercise per Week: 0 days   Minutes of Exercise per Session: 0 min  Stress: No Stress Concern Present   Feeling of Stress : Not at all  Social Connections: Socially Isolated   Frequency of Communication with Friends and Family: More than three times a week   Frequency of Social Gatherings with Friends and Family: Once a week   Attends Religious Services: Never   Marine scientist or Organizations: No   Attends Music therapist: Never   Marital Status: Divorced  Human resources officer Violence: Not At Risk   Fear of Current or Ex-Partner: No   Emotionally Abused: No   Physically Abused: No   Sexually Abused: No    Outpatient Medications Prior to Visit  Medication Sig Dispense Refill   acamprosate (CAMPRAL) 333 MG tablet TAKE 1 TABLET(333 MG) BY MOUTH IN THE MORNING AND AT BEDTIME 60 tablet 1   albuterol (PROAIR HFA) 108 (90 Base) MCG/ACT inhaler Inhale 2 puffs into the lungs every 6 (six) hours as needed for wheezing or shortness of breath. 20.1 g 1   buPROPion (WELLBUTRIN) 100 MG tablet TAKE 2 BID 360 tablet 0   cholecalciferol (VITAMIN D) 1000 UNITS tablet Take 1,000 Units by mouth 3 (three) times a week.      colchicine 0.6 MG tablet Take 2 tabs by mouth now and 1 tab in 1 hour. May repeat tomorrow if gout symptoms do not improve. 6 tablet 0   hyoscyamine (LEVBID) 0.375 MG 12 hr tablet Take 0.375 mg by mouth daily.  0   lisinopril (ZESTRIL) 10 MG tablet TAKE 1 TABLET(10 MG) BY MOUTH DAILY 90 tablet 1   lithium carbonate (LITHOBID) 300 MG CR tablet BID 180 tablet 0   Multiple Vitamin (MULTIVITAMIN) tablet Take 1  tablet by mouth 3 (three) times a week.     PARoxetine (PAXIL) 30 MG tablet ONE A DAY 90 tablet 0   vitamin B-12 (CYANOCOBALAMIN) 500 MCG tablet  Take 500 mcg by mouth. 2-3 times per week     No facility-administered medications prior to visit.    No Known Allergies  Review of Systems All review of systems negative except what is listed in the HPI     Objective:    Physical Exam Vitals reviewed.  Constitutional:      Appearance: Normal appearance.  HENT:     Head: Normocephalic and atraumatic.     Comments: Marye Round Cardiovascular:     Rate and Rhythm: Normal rate and regular rhythm.     Pulses: Normal pulses.  Pulmonary:     Effort: Pulmonary effort is normal.     Breath sounds: Normal breath sounds.  Abdominal:     General: There is no distension.     Palpations: There is no mass.     Tenderness: There is abdominal tenderness. There is left CVA tenderness. There is no right CVA tenderness, guarding or rebound.     Hernia: No hernia is present.     Comments: Slight LLQ tenderness to palpation   Skin:    General: Skin is warm and dry.     Capillary Refill: Capillary refill takes less than 2 seconds.  Neurological:     General: No focal deficit present.     Mental Status: She is alert and oriented to person, place, and time. Mental status is at baseline.  Psychiatric:        Mood and Affect: Mood normal.        Behavior: Behavior normal.        Thought Content: Thought content normal.        Judgment: Judgment normal.    BP 138/90    Pulse 65    Temp 98.1 F (36.7 C) (Oral)    Ht 5\' 4"  (1.626 m)    Wt 274 lb 12.8 oz (124.6 kg)    LMP 09/11/2013    SpO2 99%    BMI 47.17 kg/m  Wt Readings from Last 3 Encounters:  12/01/21 274 lb 12.8 oz (124.6 kg)  08/31/21 270 lb (122.5 kg)  05/04/21 272 lb (123.4 kg)    Health Maintenance Due  Topic Date Due   Zoster Vaccines- Shingrix (1 of 2) Never done   DEXA SCAN  04/04/2019   COVID-19 Vaccine (4 - Booster for  Pfizer series) 05/12/2021    There are no preventive care reminders to display for this patient.   Lab Results  Component Value Date   TSH 2.77 05/04/2021   Lab Results  Component Value Date   WBC 7.6 11/17/2020   HGB 13.4 11/17/2020   HCT 40.9 11/17/2020   MCV 102.8 (H) 11/17/2020   PLT 269 11/17/2020   Lab Results  Component Value Date   NA 141 08/31/2021   K 4.3 08/31/2021   CO2 25 08/31/2021   GLUCOSE 91 08/31/2021   BUN 14 08/31/2021   CREATININE 0.95 08/31/2021   BILITOT 0.6 05/04/2021   ALKPHOS 107 05/04/2021   AST 17 05/04/2021   ALT 18 05/04/2021   PROT 6.8 05/04/2021   ALBUMIN 4.0 05/04/2021   CALCIUM 9.8 08/31/2021   ANIONGAP 10 11/17/2020   GFR 66.26 08/31/2021   Lab Results  Component Value Date   CHOL 216 (H) 05/04/2021   Lab Results  Component Value Date   HDL 90.90 05/04/2021   Lab Results  Component Value Date   LDLCALC 108 (H) 05/04/2021   Lab Results  Component Value Date   TRIG  82.0 05/04/2021   Lab Results  Component Value Date   CHOLHDL 2 05/04/2021   Lab Results  Component Value Date   HGBA1C 6.1 05/04/2021       Assessment & Plan:   1. Left flank pain 2. LLQ pain UA negative other than small bili. Will culture and add blood work.  Given the severity of pain per description and association with flushing/diarrhea as well as LLQ tenderness on exam, will proceed with CT to ensure this is not a presentation of diverticulitis. Patient agreeable to plan. Patient aware of signs/symptoms requiring further/urgent evaluation. Will update her with results and plan.   - POCT urinalysis dipstick - CT ABDOMEN PELVIS W CONTRAST - CBC with Differential/Platelet - Comprehensive metabolic panel   Follow-up in 1 week if not improving or as needed.   Terrilyn Saver, NP

## 2021-12-02 ENCOUNTER — Other Ambulatory Visit (HOSPITAL_BASED_OUTPATIENT_CLINIC_OR_DEPARTMENT_OTHER): Payer: Self-pay | Admitting: Family Medicine

## 2021-12-02 ENCOUNTER — Other Ambulatory Visit (HOSPITAL_BASED_OUTPATIENT_CLINIC_OR_DEPARTMENT_OTHER): Payer: Self-pay

## 2021-12-02 ENCOUNTER — Encounter: Payer: Self-pay | Admitting: Family Medicine

## 2021-12-02 ENCOUNTER — Ambulatory Visit (HOSPITAL_BASED_OUTPATIENT_CLINIC_OR_DEPARTMENT_OTHER)
Admission: RE | Admit: 2021-12-02 | Discharge: 2021-12-02 | Disposition: A | Discharge status: Home / Self Care | Payer: Medicare Other | Source: Ambulatory Visit | Attending: Family Medicine | Admitting: Family Medicine

## 2021-12-02 ENCOUNTER — Other Ambulatory Visit: Payer: Self-pay

## 2021-12-02 DIAGNOSIS — K3189 Other diseases of stomach and duodenum: Secondary | ICD-10-CM | POA: Diagnosis not present

## 2021-12-02 DIAGNOSIS — M5136 Other intervertebral disc degeneration, lumbar region: Secondary | ICD-10-CM | POA: Diagnosis not present

## 2021-12-02 DIAGNOSIS — D259 Leiomyoma of uterus, unspecified: Secondary | ICD-10-CM | POA: Diagnosis not present

## 2021-12-02 DIAGNOSIS — R109 Unspecified abdominal pain: Secondary | ICD-10-CM | POA: Insufficient documentation

## 2021-12-02 DIAGNOSIS — R1032 Left lower quadrant pain: Secondary | ICD-10-CM | POA: Diagnosis not present

## 2021-12-02 DIAGNOSIS — K6389 Other specified diseases of intestine: Secondary | ICD-10-CM | POA: Diagnosis not present

## 2021-12-02 LAB — COMPREHENSIVE METABOLIC PANEL
AG Ratio: 1.5 (calc) (ref 1.0–2.5)
ALT: 13 U/L (ref 6–29)
AST: 19 U/L (ref 10–35)
Albumin: 4.1 g/dL (ref 3.6–5.1)
Alkaline phosphatase (APISO): 135 U/L (ref 37–153)
BUN/Creatinine Ratio: 19 (calc) (ref 6–22)
BUN: 20 mg/dL (ref 7–25)
CO2: 25 mmol/L (ref 20–32)
Calcium: 9.7 mg/dL (ref 8.6–10.4)
Chloride: 105 mmol/L (ref 98–110)
Creat: 1.05 mg/dL — ABNORMAL HIGH (ref 0.50–1.03)
Globulin: 2.7 g/dL (calc) (ref 1.9–3.7)
Glucose, Bld: 82 mg/dL (ref 65–99)
Potassium: 4.6 mmol/L (ref 3.5–5.3)
Sodium: 137 mmol/L (ref 135–146)
Total Bilirubin: 0.3 mg/dL (ref 0.2–1.2)
Total Protein: 6.8 g/dL (ref 6.1–8.1)

## 2021-12-02 LAB — CBC WITH DIFFERENTIAL/PLATELET
Absolute Monocytes: 416 cells/uL (ref 200–950)
Basophils Absolute: 51 cells/uL (ref 0–200)
Basophils Relative: 0.9 %
Eosinophils Absolute: 388 cells/uL (ref 15–500)
Eosinophils Relative: 6.8 %
HCT: 35.4 % (ref 35.0–45.0)
Hemoglobin: 11.6 g/dL — ABNORMAL LOW (ref 11.7–15.5)
Lymphs Abs: 1932 cells/uL (ref 850–3900)
MCH: 29.7 pg (ref 27.0–33.0)
MCHC: 32.8 g/dL (ref 32.0–36.0)
MCV: 90.8 fL (ref 80.0–100.0)
MPV: 11.9 fL (ref 7.5–12.5)
Monocytes Relative: 7.3 %
Neutro Abs: 2913 cells/uL (ref 1500–7800)
Neutrophils Relative %: 51.1 %
Platelets: 248 10*3/uL (ref 140–400)
RBC: 3.9 10*6/uL (ref 3.80–5.10)
RDW: 13.2 % (ref 11.0–15.0)
Total Lymphocyte: 33.9 %
WBC: 5.7 10*3/uL (ref 3.8–10.8)

## 2021-12-02 MED ORDER — IOHEXOL 300 MG/ML  SOLN
100.0000 mL | Freq: Once | INTRAMUSCULAR | Status: AC | PRN
  Administered 2021-12-02: 100 mL via INTRAVENOUS

## 2021-12-07 ENCOUNTER — Ambulatory Visit: Payer: Medicare Other | Admitting: Family

## 2021-12-28 ENCOUNTER — Other Ambulatory Visit: Payer: Self-pay

## 2021-12-28 ENCOUNTER — Encounter: Payer: Self-pay | Admitting: Obstetrics & Gynecology

## 2021-12-28 ENCOUNTER — Ambulatory Visit (INDEPENDENT_AMBULATORY_CARE_PROVIDER_SITE_OTHER): Payer: Medicare Other | Admitting: Obstetrics & Gynecology

## 2021-12-28 VITALS — BP 114/60 | HR 67 | Ht 64.0 in | Wt 275.0 lb

## 2021-12-28 DIAGNOSIS — D279 Benign neoplasm of unspecified ovary: Secondary | ICD-10-CM | POA: Diagnosis not present

## 2021-12-28 DIAGNOSIS — D251 Intramural leiomyoma of uterus: Secondary | ICD-10-CM

## 2021-12-28 NOTE — Progress Notes (Signed)
History:  61 y.o. G1P1001 here today for eval of CT finding of dermoid on left ovary. Pt was being seen for back pain and was noted to have a cyst on her left ovary. She reports pain in her back AFTER eating and wondered if it was related. She has had fibroids from many years. She is post menopausal.  She reports 'trigger finger' on both hands on the middle finger and reports dizziness that she will see her primary care provider about. Her back pain is better.   The following portions of the patient's history were reviewed and updated as appropriate: allergies, current medications, past family history, past medical history, past social history, past surgical history and problem list.  Review of Systems:  Pertinent items are noted in HPI.    Objective:  Physical Exam Blood pressure 114/60, pulse 67, height 5\' 4"  (1.626 m), weight 275 lb (124.7 kg), last menstrual period 09/11/2013.  CONSTITUTIONAL: Well-developed, well-nourished female in no acute distress.  HENT:  Normocephalic, atraumatic EYES: Conjunctivae and EOM are normal. No scleral icterus.  NECK: Normal range of motion SKIN: Skin is warm and dry. No rash noted. Not diaphoretic.No pallor. [place]: Alert and oriented to person, place, and time. Normal coordination.  Abd: Soft, nontender and non distended Pelvic: Normal appearing external genitalia; The uterus is enlarged. Difficult to fully assess due to body habitus. No adnexal masses appreciated. Nontender. No uterine or adnexal tenderness  Labs and Imaging CT ABDOMEN PELVIS W CONTRAST  Result Date: 12/02/2021 CLINICAL DATA:  LEFT lower quadrant LEFT flank pain intermittently, suspected kidney stone EXAM: CT ABDOMEN AND PELVIS WITH CONTRAST TECHNIQUE: Multidetector CT imaging of the abdomen and pelvis was performed using the standard protocol following bolus administration of intravenous contrast. RADIATION DOSE REDUCTION: This exam was performed according to the departmental  dose-optimization program which includes automated exposure control, adjustment of the mA and/or kV according to patient size and/or use of iterative reconstruction technique. CONTRAST:  172mL OMNIPAQUE IOHEXOL 300 MG/ML SOLN IV. Dilute oral contrast. COMPARISON:  02/21/2014 FINDINGS: Lower chest: Lung bases clear Hepatobiliary: Gallbladder surgically absent. Liver normal appearance. Pancreas: Normal appearance Spleen: Normal appearance Adrenals/Urinary Tract: Adrenal glands, kidneys, ureters, and bladder normal appearance Stomach/Bowel: Normal appearing retrocecal appendix. Prior gastric bypass surgery. Dilated small bowel loop in the LEFT mid abdomen, unchanged, question atonic loop from prior bowel surgery, little changed from previous exam, without proximal bowel dilatation to suggest obstruction. Distal sigmoid colon and proximal rectum demonstrate mild wall thickening question subtle colitis. Remaining bowel loops unremarkable. Vascular/Lymphatic: Aorta normal caliber. Vascular structures patent. Few scattered normal sized mesenteric lymph nodes. No adenopathy. Reproductive: Enlarged nodular uterus deviated to the RIGHT containing multiple leiomyomata, some of which are calcified. Uterus overall measures 10.2 x 12.0 x 9.5 cm. Largest leiomyomata measure 7.3 cm, 3.4 cm, and 4.4 cm. RIGHT ovary is not adequately visualized. A fat containing mass is seen in the LEFT adnexa measuring 2.9 x 2.8 x 2.2 cm consistent with a dermoid tumor. Other: No free air or free fluid.  No hernia. Musculoskeletal: Degenerative disc disease changes lumbar spine. IMPRESSION: Enlarged nodular uterus containing multiple leiomyomata. LEFT ovarian dermoid tumor 2.9 x 2.8 x 2.2 cm. Mild wall thickening of the distal sigmoid colon and proximal rectum question subtle colitis. Prior gastric bypass surgery. Electronically Signed   By: Lavonia Dana M.D.   On: 12/02/2021 11:21    05/28/2012 Clinical Data: Fibroids, irregular periods    TRANSABDOMINAL AND TRANSVAGINAL ULTRASOUND OF PELVIS  Technique:  Both  transabdominal and transvaginal ultrasound  examinations of the pelvis were performed. Transabdominal technique  was performed for global imaging of the pelvis including uterus,  ovaries, adnexal regions, and pelvic cul-de-sac.   It was necessary to proceed with endovaginal exam following the  transabdominal exam to visualize the endometrium and adnexa.   Comparison:  MRI dated May 28, 2011 and ultrasound dated May 25, 2011   Findings:   The uterus is enlarged and lobular in contour, measuring 12.4 x 7.8  x 6.6 cm (previously 11.8 x 12.5 x 8.0 cm.) Multiple fibroids are  again identified.  The largest is posterior broad-based subserosal  (7.3 cm- previously 8.0 cm), right anterior fundal myometrial (5.8  cm- previously 6.6 cm), and right pedunculated (5.5 cm- previously  5.7 cm.) The endometrial stripe is only seen in the lower uterine  segment but does not appear thickened, measuring 8.7 mm.  Neither  ovary is identified.  There is no free pelvic fluid.   IMPRESSION:   There has been a slight overall decrease in the size of the  uterus, as well as the largest uterine fibroids, as described  above.  Assessment & Plan:  Diagnoses and all orders for this visit:  Dermoid cyst of ovary, unspecified laterality  Fibroids, intramural   Reviewed history of fibroids. As pt is post menopausal I do not expect them ot change in szie. The last imagining was 10 years prev. They are large than that but pt was not PMP a the time.   Small dermoid on left ovary. Asymptomatic. Reviewed tx options. Recommend observation at this time. Would check at her next annual or sooner prn.   All  questions answered.   F/u in 1 year or sooner prn   Total face-to-face time with patient, review of chart, review of images and records and coordination of care was 40min.    Evelynne Spiers L. Harraway-Smith, M.D., Cherlynn June

## 2021-12-28 NOTE — Progress Notes (Signed)
Patient had CT on 12/02/2021 that revealed leiomyomatas . Left adnexa- mass. CT was done because of left sided back pain. Kathrene Alu RN

## 2022-01-03 ENCOUNTER — Ambulatory Visit (INDEPENDENT_AMBULATORY_CARE_PROVIDER_SITE_OTHER): Payer: Medicare Other | Admitting: Family

## 2022-01-03 VITALS — BP 123/58 | HR 92 | Temp 98.0°F | Resp 16 | Wt 275.0 lb

## 2022-01-03 DIAGNOSIS — E538 Deficiency of other specified B group vitamins: Secondary | ICD-10-CM

## 2022-01-03 DIAGNOSIS — F1011 Alcohol abuse, in remission: Secondary | ICD-10-CM

## 2022-01-03 DIAGNOSIS — J45909 Unspecified asthma, uncomplicated: Secondary | ICD-10-CM | POA: Diagnosis not present

## 2022-01-03 DIAGNOSIS — F32A Depression, unspecified: Secondary | ICD-10-CM | POA: Diagnosis not present

## 2022-01-03 DIAGNOSIS — E559 Vitamin D deficiency, unspecified: Secondary | ICD-10-CM

## 2022-01-03 DIAGNOSIS — M65339 Trigger finger, unspecified middle finger: Secondary | ICD-10-CM

## 2022-01-03 DIAGNOSIS — Z8739 Personal history of other diseases of the musculoskeletal system and connective tissue: Secondary | ICD-10-CM

## 2022-01-03 DIAGNOSIS — E042 Nontoxic multinodular goiter: Secondary | ICD-10-CM

## 2022-01-03 DIAGNOSIS — R739 Hyperglycemia, unspecified: Secondary | ICD-10-CM | POA: Diagnosis not present

## 2022-01-03 DIAGNOSIS — I10 Essential (primary) hypertension: Secondary | ICD-10-CM | POA: Diagnosis not present

## 2022-01-03 NOTE — Assessment & Plan Note (Signed)
Lab Results  Component Value Date   [id number] 003 05/04/2021   Clinically stable obtain follow up level.  Continue oral b12 supplements.

## 2022-01-03 NOTE — Assessment & Plan Note (Signed)
Reports symptoms stable with prn ventolin.  Continue same.

## 2022-01-03 NOTE — Assessment & Plan Note (Signed)
Feels more tearful lately. She saw psychiatry recently and no changes were made.

## 2022-01-03 NOTE — Assessment & Plan Note (Signed)
Reports taking vit D supplement. Was stable last time we checked.

## 2022-01-03 NOTE — Assessment & Plan Note (Signed)
Stable with use of prn colchicine.

## 2022-01-03 NOTE — Progress Notes (Signed)
Subjective:   By signing my name below, I, [name], attest that this documentation has been prepared under the direction and in the presence of [name], NP. 01/03/2022    Patient ID: Julie Powell, female    DOB: Dec 02, 1960, 61 y.o.   MRN: [id number]  Chief Complaint  Patient presents with   Hypertension    Here for follow up   Depression    Complains if increased sadness    HPI Patient is in today for an office visit.  Eye Pain - She complains of "tingling" feeling when blinking. She also feels mild dizziness when turning her head abruptly. Patient received a CT Scan but no diagnosis was made. Patient reports no worsening of vision.  Back Pain - Patient reports pain the left side of back. Pain worsens when eating. Symptoms have decreased at this time. However, Patient inquires about what caused the pain Refill - Patient requests refill for lisinopril, will continue with 10 MG tablets. BP is reported to be 150/28. BP Readings from Last 3 Encounters:  01/03/22 (!) 123/58  12/28/21 114/60  12/01/21 138/90   Trigger finger - Patient reports trigger fingers. Pain worsens when patient balling up hand. Patient requests seeing a hand surgeon for symptom relief. Gout - Patient reports no gout flare-ups.  Liver - Patient has been alcohol free for a year.  Mood - Mood has worsened and she does not know why. She continues taking 100 mg Wellbutrin daily PO  Asthma - Her asthma has been stable. Patient takes albuterol once - three times a month.  Vitamins- Patient reports continuing taking vitamin D supplements. Patient continues consistently taking vitamin B12 supplements and notes not taking them every day. Thyroid enlargement - She reports her thyroid has enlarged since her last visit.   Past Medical History:  Diagnosis Date   Anxiety    Asthma    B12 deficiency    Bipolar 1 disorder (Micro) 2010   ect   Fibroids    Hyperglycemia    Hypertension    Plantar  fasciitis 2013   Sciatica    RT hip   Vitamin D deficiency     Past Surgical History:  Procedure Laterality Date   [place]   "thyroglycol duct cyst in neck" had dysphagia, was told benign   GASTRIC BYPASS  2003   MICRODISCECTOMY LUMBAR  09/11/14   L3-L4  Dr Saintclair Halsted   PILONIDAL CYST DRAINAGE  1982   REDUCTION MAMMAPLASTY     SP CHOLECYSTOMY  1986    Family History  Adopted: Yes  Problem Relation Age of Onset   Aneurysm Father    Alcohol abuse Father    Alcohol abuse Brother    Alcohol abuse Brother    Cancer Brother        prostate   Alcohol abuse Brother    Lung cancer Sister    Cancer Sister        lung   Thyroid disease Neg Hx    Diabetes Neg Hx    Hypertension Neg Hx     Social History   Socioeconomic History   Marital status: Divorced    Spouse name: Not on file   Number of children: 1   Years of education: Not on file   Highest education level: Bachelor's degree (e.g., BA, AB, BS)  Occupational History   Not on file  Tobacco Use   Smoking status: Former    Packs/day: 0.50  Years: 10.00    Pack years: 5.00    Types: Cigarettes    Quit date: 11/07/1991    Years since quitting: 30.1   Smokeless tobacco: Never  Vaping Use   Vaping Use: Never used  Substance and Sexual Activity   Alcohol use: Not Currently   Drug use: Not Currently    Types: "Crack" cocaine    Comment: Caffeine- Carbonated beverage 16 ounces.   Sexual activity: Never    Birth control/protection: Post-menopausal  Other Topics Concern   Not on file  Social History Narrative   Lives with 3 year old son   She is on disability due to depression   In the past she has worked as a Education officer, community for Systems developer   Former Mudlogger   Completed bachelor's degree   Divorced.   Enjoys television   Right handed    Apartment 1st floor    Social Determinants of Health   Financial Resource  Strain: Low Risk    Difficulty of Paying Living Expenses: Not very hard  Food Insecurity: No Food Insecurity   Worried About Charity fundraiser in the Last Year: Never true   Ran Out of Food in the Last Year: Never true  Transportation Needs: No Transportation Needs   Lack of Transportation (Medical): No   Lack of Transportation (Non-Medical): No  Physical Activity: Inactive   Days of Exercise per Week: 0 days   Minutes of Exercise per Session: 0 min  Stress: No Stress Concern Present   Feeling of Stress : Not at all  Social Connections: Socially Isolated   Frequency of Communication with Friends and Family: More than three times a week   Frequency of Social Gatherings with Friends and Family: Once a week   Attends Religious Services: Never   Marine scientist or Organizations: No   Attends Music therapist: Never   Marital Status: Divorced  Human resources officer Violence: Not At Risk   Fear of Current or Ex-Partner: No   Emotionally Abused: No   Physically Abused: No   Sexually Abused: No    Outpatient Medications Prior to Visit  Medication Sig Dispense Refill   acamprosate (CAMPRAL) 333 MG tablet TAKE 1 TABLET(333 MG) BY MOUTH IN THE MORNING AND AT BEDTIME 60 tablet 1   albuterol (PROAIR HFA) 108 (90 Base) MCG/ACT inhaler Inhale 2 puffs into the lungs every 6 (six) hours as needed for wheezing or shortness of breath. 20.1 g 1   buPROPion (WELLBUTRIN) 100 MG tablet TAKE 2 BID 360 tablet 0   cholecalciferol (VITAMIN D) 1000 UNITS tablet Take 1,000 Units by mouth 3 (three) times a week.      colchicine 0.6 MG tablet Take 2 tabs by mouth now and 1 tab in 1 hour. May repeat tomorrow if gout symptoms do not improve. 6 tablet 0   hyoscyamine (LEVBID) 0.375 MG 12 hr tablet Take 0.375 mg by mouth daily.  0   lisinopril (ZESTRIL) 10 MG tablet TAKE 1 TABLET(10 MG) BY MOUTH DAILY 90 tablet 1   lithium carbonate (LITHOBID) 300 MG CR tablet BID 180 tablet 0   Multiple Vitamin  (MULTIVITAMIN) tablet Take 1 tablet by mouth 3 (three) times a week.     PARoxetine (PAXIL) 30 MG tablet ONE A DAY 90 tablet 0   vitamin B-12 (CYANOCOBALAMIN) 500 MCG tablet Take 500 mcg by mouth. 2-3 times per week     No facility-administered medications prior  to visit.    No Known Allergies  Review of Systems  Constitutional:        (+) Thyroid enlargement  Eyes:  Negative for blurred vision.  Musculoskeletal:        (+)trigger finger on 3rd digit of both hands   Neurological:  Positive for dizziness (when turning head abruptly).      Objective:    Physical Exam Constitutional:      General: She is not in acute distress.    Appearance: Normal appearance. She is not ill-appearing.  HENT:     Head: Normocephalic and atraumatic.     Right Ear: External ear normal.     Left Ear: External ear normal.  Eyes:     Extraocular Movements: Extraocular movements intact.     Pupils: Pupils are equal, round, and reactive to light.  Neck:     Thyroid: Thyromegaly present.  Cardiovascular:     Rate and Rhythm: Normal rate and regular rhythm.     Pulses: Normal pulses.     Heart sounds: Normal heart sounds. No murmur heard.   No gallop.  Pulmonary:     Effort: Pulmonary effort is normal. No respiratory distress.     Breath sounds: Normal breath sounds. No wheezing or rales.  Skin:    General: Skin is warm and dry.  Neurological:     Mental Status: She is alert and oriented to person, place, and time.  Psychiatric:        Judgment: Judgment normal.    BP (!) 123/58 (BP Location: Right Arm, Patient Position: Sitting, Cuff Size: Large)    Pulse 92    Temp 98 F (36.7 C) (Oral)    Resp 16    Wt 275 lb (124.7 kg)    LMP 09/11/2013    SpO2 100%    BMI 47.20 kg/m  Wt Readings from Last 3 Encounters:  01/03/22 275 lb (124.7 kg)  12/28/21 275 lb (124.7 kg)  12/01/21 274 lb 12.8 oz (124.6 kg)    Diabetic Foot Exam - Simple   No data filed    Lab Results  Component Value Date    WBC 5.7 12/01/2021   HGB 11.6 (L) 12/01/2021   HCT 35.4 12/01/2021   PLT 248 12/01/2021   GLUCOSE 82 12/01/2021   CHOL 216 (H) 05/04/2021   TRIG 82.0 05/04/2021   HDL 90.90 05/04/2021   LDLCALC 108 (H) 05/04/2021   ALT 13 12/01/2021   AST 19 12/01/2021   NA 137 12/01/2021   K 4.6 12/01/2021   CL 105 12/01/2021   CREATININE 1.05 (H) 12/01/2021   BUN 20 12/01/2021   CO2 25 12/01/2021   TSH 2.77 05/04/2021   HGBA1C 6.1 05/04/2021    Lab Results  Component Value Date   TSH 2.77 05/04/2021   Lab Results  Component Value Date   WBC 5.7 12/01/2021   HGB 11.6 (L) 12/01/2021   HCT 35.4 12/01/2021   MCV 90.8 12/01/2021   PLT 248 12/01/2021   Lab Results  Component Value Date   NA 137 12/01/2021   K 4.6 12/01/2021   CO2 25 12/01/2021   GLUCOSE 82 12/01/2021   BUN 20 12/01/2021   CREATININE 1.05 (H) 12/01/2021   BILITOT 0.3 12/01/2021   ALKPHOS 135 (A) 12/01/2021   AST 19 12/01/2021   ALT 13 12/01/2021   PROT 6.8 12/01/2021   ALBUMIN 4.1 12/01/2021   CALCIUM 9.7 12/01/2021   ANIONGAP 10 11/17/2020   GFR 66.26 08/31/2021  Lab Results  Component Value Date   CHOL 216 (H) 05/04/2021   Lab Results  Component Value Date   HDL 90.90 05/04/2021   Lab Results  Component Value Date   LDLCALC 108 (H) 05/04/2021   Lab Results  Component Value Date   TRIG 82.0 05/04/2021   Lab Results  Component Value Date   CHOLHDL 2 05/04/2021   Lab Results  Component Value Date   HGBA1C 6.1 05/04/2021       Assessment & Plan:   Problem List Items Addressed This Visit       Unprioritized   Vitamin D deficiency    Reports taking vit D supplement. Was stable last time we checked.       Vitamin B 12 deficiency    Lab Results  Component Value Date   [id number] 818 05/04/2021  Clinically stable obtain follow up level.  Continue oral b12 supplements.       Relevant Orders   B12   Trigger middle finger - Primary   Relevant Orders   Ambulatory referral to Sports  Medicine   Multinodular goiter   Relevant Orders   TSH   T3, free   T4, free   Hypertension    BP Readings from Last 3 Encounters:  01/03/22 (!) 123/58  12/28/21 114/60  12/01/21 138/90  Stable on lisinopril 10mg . Obtain follow up bmet.        Hyperglycemia    Lab Results  Component Value Date   HGBA1C 6.1 05/04/2021        Relevant Orders   Hemoglobin A1c   History of gout    Stable with use of prn colchicine.        History of alcohol abuse    Alcohol free for 8 months.       Depression    Feels more tearful lately. She saw psychiatry recently and no changes were made.       Asthma    Reports symptoms stable with prn ventolin.  Continue same.          No orders of the defined types were placed in this encounter.   I, Nance Pear, NP, personally preformed the services described in this documentation.  All medical record entries made by the scribe were at my direction and in my presence.  I have reviewed the chart and discharge instructions (if applicable) and agree that the record reflects my personal performance and is accurate and complete. 01/03/2022    I,[name],acting as a scribe for Nance Pear, NP.,have documented all relevant documentation on the behalf of Nance Pear, NP,as directed by  Nance Pear, NP while in the presence of Nance Pear, NP.   Nance Pear, NP

## 2022-01-03 NOTE — Assessment & Plan Note (Signed)
BP Readings from Last 3 Encounters:  01/03/22 (!) 123/58  12/28/21 114/60  12/01/21 138/90  Stable on lisinopril 10mg . Obtain follow up bmet.

## 2022-01-03 NOTE — Assessment & Plan Note (Signed)
Lab Results  Component Value Date   HGBA1C 6.1 05/04/2021

## 2022-01-03 NOTE — Assessment & Plan Note (Signed)
Alcohol free for 8 months.

## 2022-01-04 LAB — TSH: TSH: 3.73 u[IU]/mL (ref 0.35–5.50)

## 2022-01-04 LAB — T4, FREE: Free T4: 0.84 ng/dL (ref 0.60–1.60)

## 2022-01-04 LAB — T3, FREE: T3, Free: 2.8 pg/mL (ref 2.3–4.2)

## 2022-01-04 LAB — HEMOGLOBIN A1C: Hgb A1c MFr Bld: 6 % (ref 4.6–6.5)

## 2022-01-04 LAB — VITAMIN B12: Vitamin B-12: 513 pg/mL (ref 211–911)

## 2022-01-05 DIAGNOSIS — K921 Melena: Secondary | ICD-10-CM | POA: Diagnosis not present

## 2022-01-05 DIAGNOSIS — Z1211 Encounter for screening for malignant neoplasm of colon: Secondary | ICD-10-CM | POA: Diagnosis not present

## 2022-01-05 DIAGNOSIS — R1013 Epigastric pain: Secondary | ICD-10-CM | POA: Diagnosis not present

## 2022-01-05 DIAGNOSIS — R109 Unspecified abdominal pain: Secondary | ICD-10-CM | POA: Diagnosis not present

## 2022-01-05 DIAGNOSIS — K589 Irritable bowel syndrome without diarrhea: Secondary | ICD-10-CM | POA: Diagnosis not present

## 2022-01-05 DIAGNOSIS — Z9884 Bariatric surgery status: Secondary | ICD-10-CM | POA: Diagnosis not present

## 2022-01-11 ENCOUNTER — Ambulatory Visit: Payer: Medicare Other | Admitting: Family Medicine

## 2022-01-11 DIAGNOSIS — Z1211 Encounter for screening for malignant neoplasm of colon: Secondary | ICD-10-CM | POA: Diagnosis not present

## 2022-01-11 DIAGNOSIS — J45909 Unspecified asthma, uncomplicated: Secondary | ICD-10-CM | POA: Diagnosis not present

## 2022-01-11 DIAGNOSIS — R1013 Epigastric pain: Secondary | ICD-10-CM | POA: Diagnosis not present

## 2022-01-11 LAB — HM COLONOSCOPY

## 2022-01-12 ENCOUNTER — Ambulatory Visit (INDEPENDENT_AMBULATORY_CARE_PROVIDER_SITE_OTHER): Payer: Medicare Other | Admitting: Pharmacist

## 2022-01-12 DIAGNOSIS — R739 Hyperglycemia, unspecified: Secondary | ICD-10-CM

## 2022-01-12 DIAGNOSIS — F1011 Alcohol abuse, in remission: Secondary | ICD-10-CM

## 2022-01-12 DIAGNOSIS — E785 Hyperlipidemia, unspecified: Secondary | ICD-10-CM

## 2022-01-12 DIAGNOSIS — I10 Essential (primary) hypertension: Secondary | ICD-10-CM

## 2022-01-12 NOTE — Patient Instructions (Addendum)
Julie Powell ?It was a pleasure speaking with you today.  ?I have attached a summary of our visit today and information about your health goals.  (See below)  ? ?Patient Goals/Self-Care Activities ?take medications as prescribed,  ?check blood pressure 1 to 2 times per week, document, and provide at future appointments,  ?Increase physical activity. Recommend setting alarm / reminder for exercise every day. Start with 5 to 10 minutes of stepper or other exercise and work up as able. Target a minimum of 150 minutes of moderate intensity exercise per week. ?Limit intake of sugar containing foods / sweets; consider options that have less sugar / no sugar - sugar free popsicles, pudding, fruit or low sugar yogurt. Also recommend limiting intake of high carbohydrate food like potatoes, rice, bread and pasta. Continue to eat at least 5 servings of vegetables every day. ?If you have any questions or concerns, please feel free to contact me either at the phone number below or with a MyChart message.  ? ?Keep up the good work! ? ?Cherre Robins, PharmD ?Clinical Pharmacist ?[facility] ?[place] ?[phone] (direct line)  ?[phone] (main office number) ? ?Chronic Care Management Care Plan (Updated 01/12/2022) ?  ?Hypertension ?BP Readings from Last 3 Encounters:  ?01/03/22 (!) 123/58  ?12/28/21 114/60  ?12/01/21 138/90  ? ?Pharmacist Clinical Goal(s): ?Over the next 90 days, patient will work with PharmD and providers to maintain blood pressure goal <140/90 ?Current regimen:  ?Lisinopril '10mg'$  daily ?Interventions: ?Discussed blood pressure goal  ?Discussed medication adherence ?Reviewed home blood pressure readings ?Patient self care activities - Over the next 90 days, patient will: ?Maintain hypertension medication regimen. ?Continue to check blood pressure 1 or 2 times per week and record ? ?Cholesterol ?Lipid Panel  ?   ?Component Value Date/Time  ? CHOL 216 (H) 05/04/2021 1424  ? TRIG 82.0  05/04/2021 1424  ? HDL 90.90 05/04/2021 1424  ? CHOLHDL 2 05/04/2021 1424  ? VLDL 16.4 05/04/2021 1424  ? [place] 108 (H) 05/04/2021 1424  ? ? ?Pharmacist Clinical Goal(s): ?Over the next 90 days, patient will work with PharmD and providers to maintain LDL goal < 100 ?Current regimen:  ?None ?Interventions: ?Discussed cholesterol goals for LDL, HDL and Tg ?Patient self care activities - Over the next 90 days, patient will: ?Discussed lifestyle changes to improve LDL - limit intake of high fat foods and increase physical activity (see below) ? ?Pre-Diabetes ?Lab Results  ?Component Value Date/Time  ? HGBA1C 6.0 01/03/2022 02:30 PM  ? HGBA1C 6.1 05/04/2021 02:24 PM  ? ?Pharmacist Clinical Goal(s): ?Over the next 90 days, patient will work with PharmD and providers to maintain A1c goal <6.5% ?Current regimen:  ?Diet and exercise management  ?Interventions: ?Discussed A1c goal ?Patient self care activities - Over the next 90 days, patient will: ?Maintain a1c <6.5% ?Reviewed diet - discussed limiting intake of sugar containing foods / sweets; consider option that have less sugar / no sugar - sugar free popsicles, pudding, fruit or low sugar yogurt. Also discussed limiting intake of high carbohydrate food like potatoes, rice, bread and pasta.  ? ?Alcohol Use Disorder ?Pharmacist Clinical Goal(s) ?Over the next 90 days, patient will work with PharmD and providers to reduce alcohol consumptoms ?Current regimen:  ?Camprel '333mg'$  twice daily (patient is taking once daily) ?Interventions: ?Encouraged patient to continue to take Camprel and avoid alcohol - GREAT JOB! ?Patient self care activities - Over the next 90 days, patient will: ?Continue to follow up with Dr  De Nurse and therapist  ?Contact providers with needed support ? ?Mood / anxiety:  ?Pharmacist Clinical Goal(s): ?Over the next 90 days, patient will work with PharmD and providers to improve mood and anxiety ?Current treatment: ?Lithium 300g twice a day ?Paroxetine  '30mg'$  daily ?Bupropion '100mg'$  - take 2 tablets = '200mg'$  twice a day ?Interventions ?Discussed to take second dose of bupropion before 4pm if she is having trouble sleeping and taking bupropion later in day can affect sleep.  ?Patient self care activities - Over the next 90 days, patient will: ?Continue current medication regimen ?Continue regular follow up with Dr De Nurse and therapist ? ?Medication management ?Pharmacist Clinical Goal(s): ?Over the next 90 days, patient will work with PharmD and providers to maintain optimal medication adherence ?Current pharmacy: [facility] ?Interventions ?Comprehensive medication review performed. Medication list updated.  ?Reviewed medication refill history and discussed adherence.  ?Continue current medication management strategy ? ?Patient Goals/Self-Care Activities ?take medications as prescribed,  ?check blood pressure 1 to 2 times per week, document, and provide at future appointments,  ?Increase physical activity. Recommend setting alarm / reminder for exercise every day. Start with 5 to 10 minutes of stepper or other exercise and work up as able. Target a minimum of 150 minutes of moderate intensity exercise per week. ?Limit intake of sugar containing foods / sweets; consider options that have less sugar / no sugar - sugar free popsicles, pudding, fruit or low sugar yogurt. Also recommend limiting intake of high carbohydrate food like potatoes, rice, bread and pasta. Continue to eat at least 5 servings of vegetables every day. ? ?Patient verbalizes understanding of instructions and care plan provided today and agrees to view in [place]. Active MyChart status confirmed with patient.    ? ? ?Diet tips to for prediabetes: ?Increase non-starchy vegetables - carrots, green bean, squash, zucchini, tomatoes, onions, peppers, spinach and other green leafy vegetables, cabbage, lettuce, cucumbers, asparagus, okra (not fried), eggplant ?Limit sugar and processed foods (cakes, cookies,  ice cream, crackers and chips) ?Increase fresh fruit but limit serving sizes 1/2 cup or about the size of tennis or baseball ?Limit red meat to no more than 1-2 times per week (serving size about the size of your palm) ?Choose whole grains / lean proteins - whole wheat bread, quinoa, whole grain rice (1/2 cup), fish, chicken, [place] ?Avoid sugar and calorie containing beverages - soda, sweet tea and juice.  Choose water or unsweetened tea instead. ? ?

## 2022-01-12 NOTE — Chronic Care Management (AMB) (Signed)
Chronic Care Management Pharmacy Note  01/12/2022 Name:  Julie Powell MRN:  [id number] DOB:  [birth year]  Subjective: Julie Powell is an 61 y.o. year old female who is a primary patient of [name], NP.  The CCM team was consulted for assistance with disease management and care coordination needs.    Engaged with patient by telephone for follow up visit in response to provider referral for pharmacy case management and/or care coordination services.   Consent to Services:  The patient was given information about Chronic Care Management services, agreed to services, and gave verbal consent prior to initiation of services.  Please see initial visit note for detailed documentation.   Patient Care Team: [name], NP as PCP - General (Internal Medicine) Merian Capron, MD as Consulting Physician (Psychiatry) Renato Shin, MD as Consulting Physician (Endocrinology) Lucienne Capers, MD as Consulting Physician (Gastroenterology) Cameron Sprang, MD as Consulting Physician (Neurology) Cherre Robins, RPH-CPP (Pharmacist)  Recent office visits: 01/03/2022 - Int Med [facility]) Follow up chronic conditions and tigger finger,middle finger. Referred to Sports Med for trigger finger. Labs checked. No medication changes.  12/01/2021 - Fam Med Olevia Bowens, NP) Seen for left flank pain and hot flashes. UA negative. CT ordered. CBC, CMP and urine culture ordered.  CT showed Uterus is slightly larger than the last time and tumor on left ovary. Otherwise the CT scan was normal. Recommended appointment OBGYN 08/31/21-Melissa Jenetta DownerConley Canal, NP (PCP) General follow up visit. Labs ordered. Follow up in 3 months.   Recent consult visits: 01/11/2022 - [facility]) colonoscopy and EGD.  01/05/2022 - [facility]  ([place]) Seen for abdominal pain. History of gastric bypass surgery. Ordered colonoscopy and EGD 12/28/2021 - GYN (Dr Ihor Dow) eval of CT finding of dermoid  on left ovary. Reviewed history of fibroids. "As pt is post menopausal I do not expect them to change in size. The last imagining was 10 years prev. They are larger than that but pt was not PMP a the time" Small dermoid on left ovary. Asymptomatic. Reviewed tx options. Recommend observation. Recheck in 1 year. 11/23/2021 - Psychiatry (Dr De Nurse) Video Visit. No medication changes noted. F/U planned in 3 months 10/12/2021 - [facility] visits:  None in previous 6 months  Objective:  Lab Results  Component Value Date   CREATININE 1.05 (H) 12/01/2021   CREATININE 1.1 12/01/2021   CREATININE 0.95 08/31/2021    Lab Results  Component Value Date   HGBA1C 6.0 01/03/2022       Component Value Date/Time   CHOL 216 (H) 05/04/2021 1424   TRIG 82.0 05/04/2021 1424   HDL 90.90 05/04/2021 1424   CHOLHDL 2 05/04/2021 1424   VLDL 16.4 05/04/2021 1424   LDLCALC 108 (H) 05/04/2021 1424    Hepatic Function Latest Ref Rng & Units 12/01/2021 12/01/2021 05/04/2021  Total Protein 6.1 - 8.1 g/dL 6.8 - 6.8  Albumin 3.5 - 5.0 - 4.1 4.0  AST 10 - 35 U/L _0 ALT 6 - 29 U/L _1 Alk Phosphatase 25 - 125 - 135(A) 107  Total Bilirubin 0.2 - 1.2 mg/dL 0.3 - 0.6  Bilirubin, Direct 0.0 - 0.3 mg/dL - - -    Lab Results  Component Value Date/Time   TSH 3.73 01/03/2022 02:30 PM   TSH 2.77 05/04/2021 02:24 PM   FREET4 0.84 01/03/2022 02:30 PM   FREET4 0.70 09/22/2015 10:21 AM    CBC Latest Ref Rng & Units  12/01/2021 12/01/2021 11/17/2020  WBC 3.8 - 10.8 Thousand/uL 5.7 5.7 7.6  Hemoglobin 11.7 - 15.5 g/dL 11.6(L) 11.6(A) 13.4  Hematocrit 35.0 - 45.0 % 35.4 35(A) 40.9  Platelets 140 - 400 Thousand/uL 248 248 269    Lab Results  Component Value Date/Time   VD25OH 34.55 05/04/2021 02:24 PM   VD25OH 32.21 03/22/2016 05:35 PM    Clinical ASCVD: No  The 10-year ASCVD risk score (Arnett DK, et al., 2019) is: 4%   Values used to calculate the score:     Age: 56 years     Sex:  Female     Is Non-Hispanic African American: Yes     Diabetic: No     Tobacco smoker: No     Systolic Blood Pressure: 119 mmHg     Is BP treated: Yes     HDL Cholesterol: 90.9 mg/dL     Total Cholesterol: 216 mg/dL    Other: See below  Social History   Tobacco Use  Smoking Status Former   Packs/day: 0.50   Years: 10.00   Pack years: 5.00   Types: Cigarettes   Quit date: 11/07/1991   Years since quitting: 30.2  Smokeless Tobacco Never   BP Readings from Last 3 Encounters:  01/03/22 (!) 123/58  12/28/21 114/60  12/01/21 138/90   Pulse Readings from Last 3 Encounters:  01/03/22 92  12/28/21 67  12/01/21 65   Wt Readings from Last 3 Encounters:  01/03/22 275 lb (124.7 kg)  12/28/21 275 lb (124.7 kg)  12/01/21 274 lb 12.8 oz (124.6 kg)    Assessment: Review of patient past medical history, allergies, medications, health status, including review of consultants reports, laboratory and other test data, was performed as part of comprehensive evaluation and provision of chronic care management services.    SDOH:  (Social Determinants of Health) assessments and interventions performed:  SDOH Interventions    Flowsheet Row Most Recent Value  SDOH Interventions   Physical Activity Interventions Other (Comments)  [see visit notes for recommendations about physical activity]  Social Connections Interventions Other (Comment)  [has social phobia - sees psychiatry]        CCM Care Plan  No Known Allergies  Medications Reviewed Today     Reviewed by Cherre Robins, RPH-CPP (Pharmacist) on 01/12/22 at 1049  Med List Status: <None>   Medication Order Taking? Sig Documenting Provider Last Dose Status Informant  acamprosate (CAMPRAL) 333 MG tablet [id number] Yes TAKE 1 TABLET(333 MG) BY MOUTH IN THE MORNING AND AT BEDTIME  Patient taking differently: 333 mg daily.   Merian Capron, MD Taking Active   albuterol [facility] HFA) [phone] Base) MCG/ACT inhaler [id number] Yes Inhale 2  puffs into the lungs every 6 (six) hours as needed for wheezing or shortness of breath. [name], NP Taking Active   buPROPion [facility]) 100 MG tablet [id number] Yes TAKE 2 BID  Patient taking differently: Take 200 mg by mouth 2 (two) times daily. TAKE 2 BID   Merian Capron, MD Taking Active   cholecalciferol (VITAMIN D) 1000 UNITS tablet [id number] Yes Take 1,000 Units by mouth 3 (three) times a week.  [provider] Taking Active   colchicine 0.6 MG tablet [id number] No Take 2 tabs by mouth now and 1 tab in 1 hour. May repeat tomorrow if gout symptoms do not improve.  Patient not taking: Reported on 01/12/2022   [name], NP Not Taking Active   hyoscyamine (LEVBID) 0.375 MG 12 hr tablet [id number] Yes Take  0.375 mg by mouth daily. [provider] Taking Active            Med Note Antony Contras, Adria Dill Jan 12, 2022 10:46 AM) GI discussed 01/11/2022 to try to wean off  lisinopril (ZESTRIL) 10 MG tablet [id number] Yes TAKE 1 TABLET(10 MG) BY MOUTH DAILY [name], NP Taking Active   lithium carbonate (LITHOBID) 300 MG CR tablet [id number] Yes BID  Patient taking differently: Take 300 mg by mouth 2 (two) times daily. BID   Merian Capron, MD Taking Active   Multiple Vitamin (MULTIVITAMIN) tablet [id number] Yes Take 1 tablet by mouth 3 (three) times a week. [provider] Taking Active   PARoxetine (PAXIL) 30 MG tablet [id number] Yes ONE A DAY  Patient taking differently: Take 30 mg by mouth daily. ONE A Tyler Aas, MD Taking Active   vitamin B-12 (CYANOCOBALAMIN) 500 MCG tablet [id number] Yes Take 500 mcg by mouth. 2-3 times per week [provider] Taking Active             Patient Active Problem List   Diagnosis Date Noted   Trigger middle finger 08/31/2021   Hyperglycemia 05/06/2021   History of gout 05/04/2021   Depression 04/07/2019   Plantar fasciitis 04/07/2019   History of alcohol abuse 12/12/2016    Hyperlipidemia 05/13/2015   Bariatric surgery status 12/31/2014   Vitamin B 12 deficiency 12/31/2014   Preventative health care 11/13/2014   Multinodular goiter 08/27/2014   Vitamin D deficiency 08/13/2014   Asthma 08/13/2014   Sciatica 08/13/2014   Major depressive disorder, recurrent, severe w/o psychotic behavior ([place]) 06/11/2013   Leiomyoma uteri 06/20/2011   Hypertension 05/23/2011   Obesity (BMI 30-39.9) 05/23/2011    Immunization History  Administered Date(s) Administered   Influenza,inj,Quad PF,6+ Mos 08/12/2014, 09/22/2015, 09/13/2016, 08/07/2017, 10/29/2018, 07/09/2019, 08/31/2021   PFIZER(Purple Top)SARS-COV-2 Vaccination 07/06/2020, 07/27/2020, 03/17/2021   PNEUMOCOCCAL CONJUGATE-20 05/04/2021   Pneumococcal Polysaccharide-23 08/29/2011   Tdap 11/13/2014    Conditions to be addressed/monitored: HTN, Anxiety, Depression, Bipolar Disorder and elevated LFTs; alcohol use disorder; social phobia; h/o gastric bypass in 2003  Care Plan : Cope (Adult)  Updates made by Cherre Robins, RPH-CPP since 01/12/2022 12:00 AM     Problem: Medication and disease state management: Pre-DM, HTN, Asthma, Depression, Alcohol Use Disorder, Vitamin D Deficiency, Stomach Pain; elevated LFTs   Priority: High  Onset Date: 03/01/2021  Note:   Current Barriers:  Chronic Disease Management support, education, and care coordination needs related to Pre-DM, HTN, HLD, Asthma, Depression, Alcohol Use Disorder, Vitamin D Deficiency, Stomach Pain; increased LFTs Patient requests medications to be synchronized.   Pharmacist Clinical Goal(s):  Over the next 180 days, patient will achieve adherence to monitoring guidelines and medication adherence to achieve therapeutic efficacy maintain control of Blood pressure as evidenced by BP <140/90  Start Campral and stop alcohol use  through collaboration with PharmD and provider. (Patient has restarted Campral)   Interventions: 1:1 collaboration  with [name], NP regarding development and update of comprehensive plan of care as evidenced by provider attestation and co-signature Inter-disciplinary care team collaboration (see longitudinal plan of care) Comprehensive medication review performed; medication list updated in electronic medical record  Hypertension (BP goal <140/90) BP Readings from Last 3 Encounters:  01/03/22 (!) 123/58  12/28/21 114/60  12/01/21 138/90  Controlled Current regimen:  Lisinopril 20m daily Checks blood pressure at home 2 to 3 times per week; reports blood pressure ranges SBP 110 to  130 and DBP 70's Interventions: Discussed blood pressure goal  Reviewed home blood pressure readings  Maintain hypertension medication regimen. Continue to check blood pressure 1 or 2 times per week and record  Cholesterol (LDL <100) Lipid Panel  Lab Results  Component Value Date   CHOL 216 (H) 05/04/2021   HDL 90.90 05/04/2021   LDLCALC 108 (H) 05/04/2021   TRIG 82.0 05/04/2021   CHOLHDL 2 05/04/2021  Last LDL was slightly above goal Current regimen:  None Interventions: Discussed cholesterol goals for LDL, HDL and Tg Discussed lifestyle changes to improve LDL - limit intake of high fat foods and increase physical activity (see below)  Pre-Diabetes(a1c goal <6.5%) Lab Results  Component Value Date   HGBA1C 6.0 01/03/2022  Patient has questions about diagnosis of hyperglycemia and what that mean. Current regimen:  Diet and exercise management  Interventions: Discussed A1c goal; Reviewed A1c cut off for pre diabetes / hyperglycemia an diabetes.  Reviewed diet - discussed limiting intake of sugar containing foods / sweets; consider option that have less sugar / no sugar - sugar free popsicles, pudding, fruit or low sugar yogurt. Also discussed limiting intake of high carbohydrate food like potatoes, rice, bread and pasta.   Alcohol Use Disorder Improved Current regimen:  Campral 337m twice  daily (per patient she is only taking once daily) Managed by Dr ADe Nurse- last visit at behavioral health 11/2021 Patient has been sober for 1 year Interventions: Continue Camprel Continue to follow up with Dr ADe Nurseand therapist Contact providers with needed support  Mood / anxiety:  Pharmacist Clinical Goal(s): Over the next 90 days, patient will work with PharmD and providers to improve mood and anxiety Current treatment: Lithium 300g twice a day Paroxetine 341mdaily Bupropion 10049m take 2 tablets = 200m19mice a day Interventions Recommended she take second dose of bupropion before 4pm if she is having trouble sleeping. Sometimes taking bupropion later in day can affect sleep.  Continue current medication regimen Continue regular follow up with Dr AkhtDe Nurse therapist  Medication management / Health Maintenance:  Current pharmacy: [facility] completed colonoscopy 01/11/2022  Interventions Comprehensive medication review performed. Medication list updated.  Reviewed medication refill history and discussed adherence.   Patient Goals/Self-Care Activities Over the next 180 days, patient will:  take medications as prescribed,  check blood pressure 1 to 2 times per week, document, and provide at future appointments,  Increase physical activity. Recommend setting alarm / reminder for exercise every day. Start with 5 to 10 minutes of stepper or other exercise and work up as able. Target a minimum of 150 minutes of moderate intensity exercise per week. Limit intake of sugar containing foods / sweets; consider options that have less sugar / no sugar - sugar free popsicles, pudding, fruit or low sugar yogurt. Also recommend limiting intake of high carbohydrate food like potatoes, rice, bread and pasta. Continue to eat at least 5 servings of vegetables every day.  Follow Up Plan: Telephone follow up appointment with care management team member scheduled for:  3-4 months        Medication Assistance: None required.  Patient affirms current coverage meets needs.  [id number] [place] [id number]: [phone]: [phone] Up:  Patient agrees to Care Plan and Follow-up.  Plan: Telephone follow up appointment with care  management team member scheduled for:  3 to 4 months  Cherre Robins, PharmD Clinical Pharmacist [facility]

## 2022-01-17 ENCOUNTER — Encounter: Payer: Self-pay | Admitting: Family Medicine

## 2022-01-17 ENCOUNTER — Ambulatory Visit (INDEPENDENT_AMBULATORY_CARE_PROVIDER_SITE_OTHER): Payer: Medicare Other | Admitting: Family Medicine

## 2022-01-17 DIAGNOSIS — M65331 Trigger finger, right middle finger: Secondary | ICD-10-CM

## 2022-01-17 NOTE — Progress Notes (Signed)
?  Tiajah Oyster - 61 y.o. female MRN [id number]  Date of birth: 30-Sep-1961 ? ?SUBJECTIVE:  Including CC & ROS.  ?No chief complaint on file. ? ? ?Fredrica Capano is a 61 y.o. female that is presenting with trigger finger of the third digit on the left and right hand.  Has been ongoing for about she has been treating herself with splints.  She has gotten some improvement.  Her symptoms are worse in the morning.. ? ?Review of the note from 2/28 shows she has found a trigger finger. ? ? ?Review of Systems ?See HPI  ? ?HISTORY: Past Medical, Surgical, Social, and Family History Reviewed & Updated per EMR.   ?Pertinent Historical Findings include: ? ?Past Medical History:  ?Diagnosis Date  ? Anxiety   ? Asthma   ? B12 deficiency   ? Bipolar 1 disorder ([place]) 2010  ? ect  ? Fibroids   ? Hyperglycemia   ? Hypertension   ? Plantar fasciitis 2013  ? Sciatica   ? RT hip  ? Vitamin D deficiency   ? ? ?Past Surgical History:  ?Procedure Laterality Date  ? [place]  ? CYSTECTOMY  1998  ? "thyroglycol duct cyst in neck" had dysphagia, was told benign  ? GASTRIC BYPASS  2003  ? MICRODISCECTOMY LUMBAR  09/11/14  ? L3-L4  Dr Saintclair Halsted  ? [facility]  ? REDUCTION MAMMAPLASTY    ? SP CHOLECYSTOMY  1986  ? ? ? ?PHYSICAL EXAM:  ?VS: BP 118/62 (BP Location: Left Arm, Patient Position: Sitting)   Ht '5\' 4"'$  (1.626 m)   Wt 275 lb (124.7 kg)   LMP 09/11/2013   BMI 47.20 kg/m?  ?Physical Exam ?Gen: NAD, alert, cooperative with exam, well-appearing ?MSK:  ?Neurovascularly intact   ? ? ? ?ASSESSMENT & PLAN:  ? ?Trigger middle finger ?Acutely occurring.  Triggering appreciated bilaterally.  She is currently using splints and has gotten some improvement in the past few weeks. ?-Counseled on home exercise therapy and supportive care. ?-Provided splint today. ?-Could consider injection. ? ? ? ? ?

## 2022-01-17 NOTE — Assessment & Plan Note (Signed)
Acutely occurring.  Triggering appreciated bilaterally.  She is currently using splints and has gotten some improvement in the past few weeks. ?-Counseled on home exercise therapy and supportive care. ?-Provided splint today. ?-Could consider injection. ?

## 2022-02-02 ENCOUNTER — Ambulatory Visit (INDEPENDENT_AMBULATORY_CARE_PROVIDER_SITE_OTHER): Payer: Medicare Other | Admitting: Psychiatry

## 2022-02-02 ENCOUNTER — Encounter (HOSPITAL_COMMUNITY): Payer: Self-pay | Admitting: Psychiatry

## 2022-02-02 VITALS — BP 124/76 | Temp 98.2°F | Ht 64.0 in | Wt 267.0 lb

## 2022-02-02 DIAGNOSIS — F3130 Bipolar disorder, current episode depressed, mild or moderate severity, unspecified: Secondary | ICD-10-CM

## 2022-02-02 DIAGNOSIS — F4010 Social phobia, unspecified: Secondary | ICD-10-CM | POA: Diagnosis not present

## 2022-02-02 DIAGNOSIS — F1020 Alcohol dependence, uncomplicated: Secondary | ICD-10-CM

## 2022-02-02 NOTE — Progress Notes (Signed)
Patient ID: Julie Powell, female   DOB: 03/07/1961, 61 y.o.   MRN: [id number] ?[place] ?Follow-up Outpatient Visit ? ?Jennett Tarbell ?[birth year]63 ? ? ? ? ? ? ? ? ?CC  depression follow up ? ?HPI Comments: Ms. Thorup is a 61   Y/O woman with a past psychiatric history Major Depressive Disorder, severe, recurrent. Mood disorder due to [facility], Alcohol use disorder and Social Phobia.  ? ?She reamins sober, taking campral one a day only ?Working on distraction from craving ?Son is supportive ? ?Tolerating meds including lithium, will write for blood work, reviewed TSH normal ? ?Aggravating factor: lonliness ?modifying factor: housing, son ? ? Duration: since young age ? Timing: more so in evening  ? Context: alcohol use history\ ?Severity improved ? Associated signs and symptoms: ?Denies psychosis ? ? ?Traumatic Brain Injury: No none  ? ?Review of Systems  ?Cardiovascular:  Negative for palpitations.  ?Skin:  Negative for rash.  ?Neurological:  Negative for headaches.  ?Psychiatric/Behavioral:  Negative for depression and suicidal ideas.   ?Vitals:  ? 02/02/22 1338  ?BP: 124/76  ?Temp: 98.2 ?F (36.8 ?C)  ?Weight: 267 lb (121.1 kg)  ?Height: '5\' 4"'$  (1.626 m)  ? ? ?Physical Exam  ?Constitutional: She appears well-developed and well-nourished. No distress.  ?Skin: She is not diaphoretic.  ?No tremors of hands noted while patient was interviewed.  ? ? ? ?Past Medical History: Reviewed  ?Past Medical History:  ?Diagnosis Date  ? Anxiety   ? Asthma   ? B12 deficiency   ? Bipolar 1 disorder ([place]) 2010  ? ect  ? Fibroids   ? Hyperglycemia   ? Hypertension   ? Plantar fasciitis 2013  ? Sciatica   ? RT hip  ? Vitamin D deficiency   ? ? ?Allergies: No Known Allergies  ? ?Current Medications: Reviewed  ? ?Current Outpatient Medications on File Prior to Visit  ?Medication Sig Dispense Refill  ? acamprosate (CAMPRAL) 333 MG tablet TAKE 1 TABLET(333 MG) BY MOUTH IN THE MORNING AND AT BEDTIME (Patient taking differently: 333 mg  daily.) 60 tablet 1  ? albuterol (PROAIR HFA) 108 (90 Base) MCG/ACT inhaler Inhale 2 puffs into the lungs every 6 (six) hours as needed for wheezing or shortness of breath. 20.1 g 1  ? buPROPion (WELLBUTRIN) 100 MG tablet TAKE 2 BID (Patient taking differently: Take 200 mg by mouth 2 (two) times daily. TAKE 2 BID) 360 tablet 0  ? cholecalciferol (VITAMIN D) 1000 UNITS tablet Take 1,000 Units by mouth 3 (three) times a week.     ? colchicine 0.6 MG tablet Take 2 tabs by mouth now and 1 tab in 1 hour. May repeat tomorrow if gout symptoms do not improve. (Patient not taking: Reported on 01/12/2022) 6 tablet 0  ? hyoscyamine (LEVBID) 0.375 MG 12 hr tablet Take 0.375 mg by mouth daily.  0  ? lisinopril (ZESTRIL) 10 MG tablet TAKE 1 TABLET(10 MG) BY MOUTH DAILY 90 tablet 1  ? lithium carbonate (LITHOBID) 300 MG CR tablet BID (Patient taking differently: Take 300 mg by mouth 2 (two) times daily. BID) 180 tablet 0  ? Multiple Vitamin (MULTIVITAMIN) tablet Take 1 tablet by mouth 3 (three) times a week.    ? PARoxetine (PAXIL) 30 MG tablet ONE A DAY (Patient taking differently: Take 30 mg by mouth daily. ONE A DAY) 90 tablet 0  ? vitamin B-12 (CYANOCOBALAMIN) 500 MCG tablet Take 500 mcg by mouth. 2-3 times per week    ? ?  No current facility-administered medications on file prior to visit.  ? ? ? ? ?Family History: Reviewed  ?Family History   ?Problem  Relation  Age of Onset   ?  Adopted: Yes   ?  Aneurysm  Father    ? ?Psychiatric Specialty Exam: ? ?Objective: Appearance:   ?Eye Contact:: casual  ?Speech: Clear and Coherent and Normal Rate   ?Volume: Normal   ?Mood fair   ?Affect:congruent  ?Thought Process: Goal Directed, Logical and Loose   ?Orientation: Full   ?Thought Content: WDL   ?Suicidal Thoughts: Patient denies   ?Homicidal Thoughts: Patient denies   ?Judgement: Fair   ?Insight: Fair   ?Psychomotor Activity: Normal   ?Akathisia: No   ?Handed: Right   ?Memory: Immediate 3/3, Recent: 1/3   ?Language-intact  ?Fund of  knowledge-average to above average.  ?AIMS (if indicated): Not indicated at this time.   ?Assets: Communication Skills  ?Desire for Improvement   ? ? ? ?Plan of Care:  ? ? ?  ?  ?. ?  ?Medications and Assessment : ?Prior documentation reviewed ?Bipolar depression: doing fair, continue paxil, wellbutrin,  ?Lithium, lab for levels requested  ? ?Anxiety: fair on paxil ? ?alcohol SFK:CLEXN, discussed relapse prevention ?  ?Fu 3 months or earlier  ? ? ? time spent in office and face to face 64min ?  ?  ?  ? ?NMerian Capron M.D.  ?02/02/2022 1:55 PM ?

## 2022-02-03 DIAGNOSIS — I10 Essential (primary) hypertension: Secondary | ICD-10-CM

## 2022-02-03 DIAGNOSIS — E785 Hyperlipidemia, unspecified: Secondary | ICD-10-CM

## 2022-03-08 ENCOUNTER — Other Ambulatory Visit (HOSPITAL_COMMUNITY): Payer: Self-pay

## 2022-03-08 MED ORDER — BUPROPION HCL 100 MG PO TABS: 200.0000 mg | ORAL_TABLET | Freq: Two times a day (BID) | ORAL | 0 refills | Status: DC

## 2022-03-09 DIAGNOSIS — F3130 Bipolar disorder, current episode depressed, mild or moderate severity, unspecified: Secondary | ICD-10-CM | POA: Diagnosis not present

## 2022-03-10 LAB — LITHIUM LEVEL: Lithium Lvl: 0.8 mmol/L (ref 0.6–1.2)

## 2022-04-07 ENCOUNTER — Other Ambulatory Visit: Payer: Self-pay | Admitting: Family

## 2022-04-07 DIAGNOSIS — I10 Essential (primary) hypertension: Secondary | ICD-10-CM

## 2022-04-10 ENCOUNTER — Telehealth (HOSPITAL_COMMUNITY): Payer: Self-pay

## 2022-04-10 DIAGNOSIS — F3130 Bipolar disorder, current episode depressed, mild or moderate severity, unspecified: Secondary | ICD-10-CM

## 2022-04-10 MED ORDER — ACAMPROSATE CALCIUM 333 MG PO TBEC: 333.0000 mg | DELAYED_RELEASE_TABLET | Freq: Every day | ORAL | 0 refills | Status: DC

## 2022-04-10 NOTE — Telephone Encounter (Signed)
A new Campral 333 mg order e-scribed to patient's [facility] for once a day, per verbal order from Dr. Melanee Left covering for Dr. De Nurse this date, and as patient reported taking daily, #30 with no refills.

## 2022-04-10 NOTE — Telephone Encounter (Signed)
Medication refill - Fax from [facility] on [address] requesting a new Campral 333 mg order, last provided 09/20/21 + 1 refill and last filled 03/06/22.  Patient returns 05/03/22.

## 2022-04-10 NOTE — Telephone Encounter (Signed)
Ok to send it in for just one fill

## 2022-04-20 ENCOUNTER — Ambulatory Visit (INDEPENDENT_AMBULATORY_CARE_PROVIDER_SITE_OTHER): Payer: Medicare Other

## 2022-04-20 VITALS — Ht 64.0 in | Wt 267.0 lb

## 2022-04-20 DIAGNOSIS — Z0000 Encounter for general adult medical examination without abnormal findings: Secondary | ICD-10-CM

## 2022-04-20 NOTE — Patient Instructions (Signed)
Ms. Dinges , Thank you for taking time to come for your Medicare Wellness Visit. I appreciate your ongoing commitment to your health goals. Please review the following plan we discussed and let me know if I can assist you in the future.   Screening recommendations/referrals: Colonoscopy: 01/11/22 due 01/12/32 Mammogram: 10/04/21 due 10/04/22 Bone Density: N/A Recommended yearly ophthalmology/optometry visit for glaucoma screening and checkup Recommended yearly dental visit for hygiene and checkup  Vaccinations: Influenza vaccine: up to date Pneumococcal vaccine: up to date Tdap vaccine: up to date Shingles vaccine: Due-May obtain vaccine at our office or your local pharmacy.   Covid-19: completed  Advanced directives: no  Conditions/risks identified: see problem list   Next appointment: Follow up in one year for your annual wellness visit. 04/24/23  Preventive Care 40-64 Years, Female Preventive care refers to lifestyle choices and visits with your health care provider that can promote health and wellness. What does preventive care include? A yearly physical exam. This is also called an annual well check. Dental exams once or twice a year. Routine eye exams. Ask your health care provider how often you should have your eyes checked. Personal lifestyle choices, including: Daily care of your teeth and gums. Regular physical activity. Eating a healthy diet. Avoiding tobacco and drug use. Limiting alcohol use. Practicing safe sex. Taking low-dose aspirin daily starting at age 74. Taking vitamin and mineral supplements as recommended by your health care provider. What happens during an annual well check? The services and screenings done by your health care provider during your annual well check will depend on your age, overall health, lifestyle risk factors, and family history of disease. Counseling  Your health care provider may ask you questions about your: Alcohol use. Tobacco  use. Drug use. Emotional well-being. Home and relationship well-being. Sexual activity. Eating habits. Work and work Statistician. Method of birth control. Menstrual cycle. Pregnancy history. Screening  You may have the following tests or measurements: Height, weight, and BMI. Blood pressure. Lipid and cholesterol levels. These may be checked every 5 years, or more frequently if you are over 37 years old. Skin check. Lung cancer screening. You may have this screening every year starting at age 43 if you have a 30-pack-year history of smoking and currently smoke or have quit within the past 15 years. Fecal occult blood test (FOBT) of the stool. You may have this test every year starting at age 16. Flexible sigmoidoscopy or colonoscopy. You may have a sigmoidoscopy every 5 years or a colonoscopy every 10 years starting at age 41. Hepatitis C blood test. Hepatitis B blood test. Sexually transmitted disease (STD) testing. Diabetes screening. This is done by checking your blood sugar (glucose) after you have not eaten for a while (fasting). You may have this done every 1-3 years. Mammogram. This may be done every 1-2 years. Talk to your health care provider about when you should start having regular mammograms. This may depend on whether you have a family history of breast cancer. BRCA-related cancer screening. This may be done if you have a family history of breast, ovarian, tubal, or peritoneal cancers. Pelvic exam and Pap test. This may be done every 3 years starting at age 31. Starting at age 46, this may be done every 5 years if you have a Pap test in combination with an HPV test. Bone density scan. This is done to screen for osteoporosis. You may have this scan if you are at high risk for osteoporosis. Discuss your test results,  treatment options, and if necessary, the need for more tests with your health care provider. Vaccines  Your health care provider may recommend certain vaccines,  such as: Influenza vaccine. This is recommended every year. Tetanus, diphtheria, and acellular pertussis (Tdap, Td) vaccine. You may need a Td booster every 10 years. Zoster vaccine. You may need this after age 60. Pneumococcal 13-valent conjugate (PCV13) vaccine. You may need this if you have certain conditions and were not previously vaccinated. Pneumococcal polysaccharide (PPSV23) vaccine. You may need one or two doses if you smoke cigarettes or if you have certain conditions. Talk to your health care provider about which screenings and vaccines you need and how often you need them. This information is not intended to replace advice given to you by your health care provider. Make sure you discuss any questions you have with your health care provider. Document Released: 11/19/2015 Document Revised: 07/12/2016 Document Reviewed: 08/24/2015 Elsevier Interactive Patient Education  2017 [employer]    Fall Prevention in the Home Falls can cause injuries. They can happen to people of all ages. There are many things you can do to make your home safe and to help prevent falls. What can I do on the outside of my home? Regularly fix the edges of walkways and driveways and fix any cracks. Remove anything that might make you trip as you walk through a door, such as a raised step or threshold. Trim any bushes or trees on the path to your home. Use bright outdoor lighting. Clear any walking paths of anything that might make someone trip, such as rocks or tools. Regularly check to see if handrails are loose or broken. Make sure that both sides of any steps have handrails. Any raised decks and porches should have guardrails on the edges. Have any leaves, snow, or ice cleared regularly. Use sand or salt on walking paths during winter. Clean up any spills in your garage right away. This includes oil or grease spills. What can I do in the bathroom? Use night lights. Install grab bars by the toilet and  in the tub and shower. Do not use towel bars as grab bars. Use non-skid mats or decals in the tub or shower. If you need to sit down in the shower, use a plastic, non-slip stool. Keep the floor dry. Clean up any water that spills on the floor as soon as it happens. Remove soap buildup in the tub or shower regularly. Attach bath mats securely with double-sided non-slip rug tape. Do not have throw rugs and other things on the floor that can make you trip. What can I do in the bedroom? Use night lights. Make sure that you have a light by your bed that is easy to reach. Do not use any sheets or blankets that are too big for your bed. They should not hang down onto the floor. Have a firm chair that has side arms. You can use this for support while you get dressed. Do not have throw rugs and other things on the floor that can make you trip. What can I do in the kitchen? Clean up any spills right away. Avoid walking on wet floors. Keep items that you use a lot in easy-to-reach places. If you need to reach something above you, use a strong step stool that has a grab bar. Keep electrical cords out of the way. Do not use floor polish or wax that makes floors slippery. If you must use wax, use non-skid floor wax.   Do not have throw rugs and other things on the floor that can make you trip. What can I do with my stairs? Do not leave any items on the stairs. Make sure that there are handrails on both sides of the stairs and use them. Fix handrails that are broken or loose. Make sure that handrails are as long as the stairways. Check any carpeting to make sure that it is firmly attached to the stairs. Fix any carpet that is loose or worn. Avoid having throw rugs at the top or bottom of the stairs. If you do have throw rugs, attach them to the floor with carpet tape. Make sure that you have a light switch at the top of the stairs and the bottom of the stairs. If you do not have them, ask someone to add  them for you. What else can I do to help prevent falls? Wear shoes that: Do not have high heels. Have rubber bottoms. Are comfortable and fit you well. Are closed at the toe. Do not wear sandals. If you use a stepladder: Make sure that it is fully opened. Do not climb a closed stepladder. Make sure that both sides of the stepladder are locked into place. Ask someone to hold it for you, if possible. Clearly mark and make sure that you can see: Any grab bars or handrails. First and last steps. Where the edge of each step is. Use tools that help you move around (mobility aids) if they are needed. These include: Canes. Walkers. Scooters. Crutches. Turn on the lights when you go into a dark area. Replace any light bulbs as soon as they burn out. Set up your furniture so you have a clear path. Avoid moving your furniture around. If any of your floors are uneven, fix them. If there are any pets around you, be aware of where they are. Review your medicines with your doctor. Some medicines can make you feel dizzy. This can increase your chance of falling. Ask your doctor what other things that you can do to help prevent falls. This information is not intended to replace advice given to you by your health care provider. Make sure you discuss any questions you have with your health care provider. Document Released: 08/19/2009 Document Revised: 03/30/2016 Document Reviewed: 11/27/2014 Elsevier Interactive Patient Education  2017 [employer].

## 2022-04-20 NOTE — Progress Notes (Cosign Needed)
Subjective:   Julie Powell is a 61 y.o. female who presents for Medicare Annual (Subsequent) preventive examination.  I connected with  [name] on 04/20/22 by a audio enabled telemedicine application and verified that I am speaking with the correct person using two identifiers.  Patient Location: Home  Provider Location: Office/Clinic  I discussed the limitations of evaluation and management by telemedicine. The patient expressed understanding and agreed to proceed.   Review of Systems     Cardiac Risk Factors include: advanced age (>46mn, >>73women);hypertension;dyslipidemia     Objective:    Today's Vitals   04/20/22 1350  Weight: 267 lb (121.1 kg)  Height: '5\' 4"'$  (1.626 m)  PainSc: 8    Body mass index is 45.83 kg/m.     04/20/2022    1:44 PM 04/18/2021    1:44 PM 11/17/2020    5:12 PM 11/27/2019   11:00 AM 04/07/2019    2:20 PM 11/25/2018   11:06 AM 11/22/2017   11:25 AM  Advanced Directives  Does Patient Have a Medical Advance Directive? No No No No No No No  Would patient like information on creating a medical advance directive? No - Patient declined No - Patient declined  No - Patient declined  No - Patient declined No - Patient declined    Current Medications (verified) Outpatient Encounter Medications as of 04/20/2022  Medication Sig   acamprosate (CAMPRAL) 333 MG tablet Take 1 tablet (333 mg total) by mouth daily.   albuterol (PROAIR HFA) 108 (90 Base) MCG/ACT inhaler Inhale 2 puffs into the lungs every 6 (six) hours as needed for wheezing or shortness of breath.   buPROPion (WELLBUTRIN) 100 MG tablet Take 2 tablets (200 mg total) by mouth 2 (two) times daily. TAKE 2 BID   cholecalciferol (VITAMIN D) 1000 UNITS tablet Take 1,000 Units by mouth 3 (three) times a week.    colchicine 0.6 MG tablet Take 2 tabs by mouth now and 1 tab in 1 hour. May repeat tomorrow if gout symptoms do not improve.   hyoscyamine (LEVBID) 0.375 MG 12 hr tablet Take 0.375 mg by  mouth daily.   lisinopril (ZESTRIL) 10 MG tablet TAKE 1 TABLET(10 MG) BY MOUTH DAILY   lithium carbonate (LITHOBID) 300 MG CR tablet BID (Patient taking differently: Take 300 mg by mouth 2 (two) times daily. BID)   Multiple Vitamin (MULTIVITAMIN) tablet Take 1 tablet by mouth 3 (three) times a week.   PARoxetine (PAXIL) 30 MG tablet ONE A DAY (Patient taking differently: Take 30 mg by mouth daily. ONE A DAY)   vitamin B-12 (CYANOCOBALAMIN) 500 MCG tablet Take 500 mcg by mouth. 2-3 times per week   No facility-administered encounter medications on file as of 04/20/2022.    Allergies (verified) Patient has no known allergies.   History: Past Medical History:  Diagnosis Date   Anxiety    Asthma    B12 deficiency    Bipolar 1 disorder ([place] 2010   ect   Fibroids    Hyperglycemia    Hypertension    Plantar fasciitis 2013   Sciatica    RT hip   Vitamin D deficiency    Past Surgical History:  Procedure Laterality Date   [place]  "thyrogly[place] duct cyst in neck" had dysphagia, was told benign   GASTRIC BYPASS  2003   MICRODISCECTOMY LUMBAR  09/11/14   L3-L4  Dr CSaintclair Halsted  PILONIDAL CYST DRAINAGE  [phone]  REDUCTION MAMMAPLASTY     SP CHOLECYSTOMY  1986   Family History  Adopted: Yes  Problem Relation Age of Onset   Aneurysm Father    Alcohol abuse Father    Alcohol abuse Brother    Alcohol abuse Brother    Cancer Brother        prostate   Alcohol abuse Brother    Lung cancer Sister    Cancer Sister        lung   Thyroid disease Neg Hx    Diabetes Neg Hx    Hypertension Neg Hx    Social History   Socioeconomic History   Marital status: Divorced    Spouse name: Not on file   Number of children: 1   Years of education: Not on file   Highest education level: Bachelor's degree (e.g., BA, AB, BS)  Occupational History   Not on file  Tobacco Use   Smoking status: Former    Packs/day: 0.50    Years: 10.00    Total pack years: 5.00     Types: Cigarettes    Quit date: 11/07/1991    Years since quitting: 30.4   Smokeless tobacco: Never  Vaping Use   Vaping Use: Never used  Substance and Sexual Activity   Alcohol use: Not Currently   Drug use: Not Currently    Types: "Crack" cocaine    Comment: Caffeine- Carbonated beverage 16 ounces.   Sexual activity: Never    Birth control/protection: Post-menopausal  Other Topics Concern   Not on file  Social History Narrative   Lives with 18 year old son   She is on disability due to depression   In the past she has worked as a Education officer, community for Systems developer   Former Mudlogger   Completed bachelor's degree   Divorced.   Enjoys television   Right handed    Apartment 1st floor    Social Determinants of Health   Financial Resource Strain: Low Risk  (03/01/2021)   Overall Financial Resource Strain (CARDIA)    Difficulty of Paying Living Expenses: Not very hard  Food Insecurity: No Food Insecurity (04/18/2021)   Hunger Vital Sign    Worried About Running Out of Food in the Last Year: Never true    Ran Out of Food in the Last Year: Never true  Transportation Needs: No Transportation Needs (04/18/2021)   PRAPARE - Hydrologist (Medical): No    Lack of Transportation (Non-Medical): No  Physical Activity: Inactive (01/12/2022)   Exercise Vital Sign    Days of Exercise per Week: 0 days    Minutes of Exercise per Session: 0 min  Stress: No Stress Concern Present (04/18/2021)   [place]    Feeling of Stress : Not at all  Social Connections: Socially Isolated (01/12/2022)   Social Connection and Isolation Panel [NHANES]    Frequency of Communication with Friends and Family: Three times a week    Frequency of Social Gatherings with Friends and Family: Once a week    Attends Religious Services: Never    Marine scientist or  Organizations: No    Attends Music therapist: Never    Marital Status: Divorced    Tobacco Counseling Counseling given: Not Answered   Clinical Intake:  Pre-visit preparation completed: Yes  Pain : 0-10 Pain Score: 8  Pain Location: Back Pain Descriptors /  Indicators: Aching, Dull, Sore Pain Onset: More than a month ago Pain Frequency: Constant     BMI - recorded: 45.83 Nutritional Status: BMI > 30  Obese Nutritional Risks: None Diabetes: No  How often do you need to have someone help you when you read instructions, pamphlets, or other written materials from your doctor or pharmacy?: 1 - Never  Diabetic?No  Interpreter Needed?: No  Information entered by :: [place] of Daily Living    04/20/2022    1:53 PM  In your present state of health, do you have any difficulty performing the following activities:  Hearing? 0  Vision? 0  Difficulty concentrating or making decisions? 0  Walking or climbing stairs? 1  Dressing or bathing? 1  Doing errands, shopping? 0  Preparing Food and eating ? N  Using the Toilet? N  In the past six months, have you accidently leaked urine? N  Do you have problems with loss of bowel control? N  Managing your Medications? N  Managing your Finances? N  Housekeeping or managing your Housekeeping? N    Patient Care Team: [name], NP as PCP - General (Internal Medicine) Merian Capron, MD as Consulting Physician (Psychiatry) Renato Shin, MD (Inactive) as Consulting Physician (Endocrinology) Lucienne Capers, MD as Consulting Physician (Gastroenterology) Cameron Sprang, MD as Consulting Physician (Neurology) Cherre Robins, [place] (Pharmacist)  Indicate any recent Medical Services you may have received from other than [facility] providers in the past year (date may be approximate).     Assessment:   This is a routine wellness examination for Filippa.  Hearing/Vision screen No results  found.  Dietary issues and exercise activities discussed: Current Exercise Habits: The patient does not participate in regular exercise at present, Exercise limited by: None identified   Goals Addressed               This Visit's Progress     Increase physical activity   Not on track     Increase water intake   Not on track     Stop drinking so much (pt-stated)   On track      Depression Screen    04/20/2022    1:45 PM 01/03/2022    2:29 PM 08/31/2021    2:12 PM 05/04/2021    2:14 PM 04/18/2021    1:48 PM 03/23/2021    1:52 PM 11/30/2020   10:43 AM  PHQ 2/9 Scores  PHQ - 2 Score '4 3 3 5 1  4  '$ PHQ- 9 Score '15 12 13 15   15     '$ Information is confidential and restricted. Go to Review Flowsheets to unlock data.    Fall Risk    04/20/2022    1:44 PM 05/04/2021    1:56 PM 04/18/2021    1:46 PM 11/27/2019   11:03 AM 04/07/2019    2:20 PM  [place] in the past year? 0 1 1 0 1  Number falls in past yr: 0 1 1 0 1  Injury with Fall? 0 1 0 0 1  Risk for fall due to : History of fall(s)  History of fall(s)    Follow up Falls evaluation completed  Falls prevention discussed      FALL RISK PREVENTION PERTAINING TO THE HOME:  Any stairs in or around the home? No  If so, are there any without handrails?  N/A Home free of loose throw rugs in walkways, pet beds, electrical cords, etc?  Yes  Adequate lighting in your home to reduce risk of falls? Yes   ASSISTIVE DEVICES UTILIZED TO PREVENT FALLS:  Life alert? No  Use of a cane, walker or w/c? No  Grab bars in the bathroom? No  Shower chair or bench in shower? No  Elevated toilet seat or a handicapped toilet? No   TIMED UP AND GO:  Was the test performed? No .    Cognitive Function:    11/22/2017   11:30 AM 10/17/2016    2:12 PM  MMSE - Mini Mental State Exam  Orientation to time 5 5  Orientation to Place 5 5  Registration 3 3  Attention/ Calculation 5 5  Recall 3 2  Language- name 2 objects 2 2  Language-  repeat 1 1  Language- follow 3 step command 3 3  Language- read & follow direction 1 1  Write a sentence 1 1  Copy design 1 1  Total score 30 29        04/20/2022    1:56 PM  6CIT Screen  What Year? 0 points  What month? 0 points  What time? 0 points  Count back from 20 0 points  Months in reverse 0 points  Repeat phrase 0 points  Total Score 0 points    Immunizations Immunization History  Administered Date(s) Administered   Influenza,inj,Quad PF,6+ Mos 08/12/2014, 09/22/2015, 09/13/2016, 08/07/2017, 10/29/2018, 07/09/2019, 08/31/2021   PFIZER(Purple Top)SARS-COV-2 Vaccination 07/06/2020, 07/27/2020, 03/17/2021   PNEUMOCOCCAL CONJUGATE-20 05/04/2021   Pfizer Covid-19 Vaccine Bivalent Booster 30yr & up 09/08/2021   Pneumococcal Polysaccharide-23 08/29/2011   Tdap 11/13/2014    TDAP status: Up to date  Flu Vaccine status: Up to date  Pneumococcal vaccine status: Up to date  Covid-19 vaccine status: Completed vaccines  Qualifies for Shingles Vaccine? Yes   Zostavax completed No   Shingrix Completed?: No.    Education has been provided regarding the importance of this vaccine. Patient has been advised to call insurance company to determine out of pocket expense if they have not yet received this vaccine. Advised may also receive vaccine at local pharmacy or Health Dept. Verbalized acceptance and understanding.  Screening Tests Health Maintenance  Topic Date Due   Zoster Vaccines- Shingrix (1 of 2) Never done   DEXA SCAN  04/04/2019   INFLUENZA VACCINE  06/06/2022   PAP SMEAR-Modifier  07/08/2022   MAMMOGRAM  10/05/2023   TETANUS/TDAP  11/13/2024   COLONOSCOPY (Pts 45-487yrInsurance coverage will need to be confirmed)  01/12/2032   COVID-19 Vaccine  Completed   Hepatitis C Screening  Completed   HIV Screening  Completed   HPV VACCINES  Aged Out    Health Maintenance  Health Maintenance Due  Topic Date Due   Zoster Vaccines- Shingrix (1 of 2) Never done    DEXA SCAN  04/04/2019    Colorectal cancer screening: Type of screening: Colonoscopy. Completed 01/11/22. Repeat every 10 years  Mammogram status: Completed 10/04/21. Repeat every year    Lung Cancer Screening: (Low Dose CT Chest recommended if Age 802-80ears, 30 pack-year currently smoking OR have quit w/in 15years.) does not qualify.   Lung Cancer Screening Referral: N/A  Additional Screening:  Hepatitis C Screening: does qualify; Completed 08/09/17  Vision Screening: Recommended annual ophthalmology exams for early detection of glaucoma and other disorders of the eye. Is the patient up to date with their annual eye exam?  No  Who is the provider or what is the name of the office  in which the patient attends annual eye exams? [facility] If pt is not established with a provider, would they like to be referred to a provider to establish care? No .   Dental Screening: Recommended annual dental exams for proper oral hygiene  Community Resource Referral / Chronic Care Management: CRR required this visit?  No   CCM required this visit?  No      Plan:     I have personally reviewed and noted the following in the patient's chart:   Medical and social history Use of alcohol, tobacco or illicit drugs  Current medications and supplements including opioid prescriptions.  Functional ability and status Nutritional status Physical activity Advanced directives List of other physicians Hospitalizations, surgeries, and ER visits in previous 12 months Vitals Screenings to include cognitive, depression, and falls Referrals and appointments  In addition, I have reviewed and discussed with patient certain preventive protocols, quality metrics, and best practice recommendations. A written personalized care plan for preventive services as well as general preventive health recommendations were provided to patient.     Duard Brady Nyjai Graff, [place]   04/20/2022   Nurse Notes: none

## 2022-05-03 ENCOUNTER — Ambulatory Visit (HOSPITAL_COMMUNITY): Payer: Medicare Other | Admitting: Psychiatry

## 2022-05-04 ENCOUNTER — Ambulatory Visit (HOSPITAL_COMMUNITY): Payer: Medicare Other | Admitting: Psychiatry

## 2022-05-18 ENCOUNTER — Encounter (HOSPITAL_COMMUNITY): Payer: Self-pay | Admitting: Psychiatry

## 2022-05-18 ENCOUNTER — Ambulatory Visit (INDEPENDENT_AMBULATORY_CARE_PROVIDER_SITE_OTHER): Payer: Medicare Other | Admitting: Pharmacist

## 2022-05-18 ENCOUNTER — Ambulatory Visit (INDEPENDENT_AMBULATORY_CARE_PROVIDER_SITE_OTHER): Payer: Medicare Other | Admitting: Psychiatry

## 2022-05-18 VITALS — BP 130/70 | Temp 98.8°F | Ht 64.0 in | Wt 272.0 lb

## 2022-05-18 DIAGNOSIS — R739 Hyperglycemia, unspecified: Secondary | ICD-10-CM

## 2022-05-18 DIAGNOSIS — F1020 Alcohol dependence, uncomplicated: Secondary | ICD-10-CM

## 2022-05-18 DIAGNOSIS — F3130 Bipolar disorder, current episode depressed, mild or moderate severity, unspecified: Secondary | ICD-10-CM | POA: Diagnosis not present

## 2022-05-18 DIAGNOSIS — I10 Essential (primary) hypertension: Secondary | ICD-10-CM

## 2022-05-18 DIAGNOSIS — F4010 Social phobia, unspecified: Secondary | ICD-10-CM

## 2022-05-18 DIAGNOSIS — E785 Hyperlipidemia, unspecified: Secondary | ICD-10-CM

## 2022-05-18 MED ORDER — BUPROPION HCL 100 MG PO TABS: 200.0000 mg | ORAL_TABLET | Freq: Two times a day (BID) | ORAL | 0 refills | Status: DC

## 2022-05-18 MED ORDER — PAROXETINE HCL 30 MG PO TABS: ORAL_TABLET | ORAL | 0 refills | Status: DC

## 2022-05-18 MED ORDER — LITHIUM CARBONATE ER 300 MG PO TBCR: EXTENDED_RELEASE_TABLET | ORAL | 0 refills | Status: DC

## 2022-05-18 MED ORDER — ACAMPROSATE CALCIUM 333 MG PO TBEC: 333.0000 mg | DELAYED_RELEASE_TABLET | Freq: Every day | ORAL | 0 refills | Status: DC

## 2022-05-18 MED ORDER — ACAMPROSATE CALCIUM 333 MG PO TBEC: 333.0000 mg | DELAYED_RELEASE_TABLET | Freq: Two times a day (BID) | ORAL | 0 refills | Status: DC

## 2022-05-18 NOTE — Patient Instructions (Signed)
[name] It was a pleasure speaking with you today. As we discussed you have met all your medication related goals and your blood pressure has been at goal of < 140/90   I am not planning a follow up visit / call at this times but you can contact me if you have future questions or concerns about your medications or health at the number below or through [place].   Keep up the good work!  Cherre Robins, PharmD Clinical Pharmacist [place] [phone] (direct line)  [phone] (main office number)   Patient verbalizes understanding of instructions and care plan provided today and agrees to view in [place]. Active MyChart status and patient understanding of how to access instructions and care plan via MyChart confirmed with patient.

## 2022-05-18 NOTE — Progress Notes (Signed)
Patient ID: Julie Powell, female   DOB: August 19, 1961, 61 y.o.   MRN: [id number] [place] Follow-up Outpatient Visit  Angee Gupton Apr 22, 1961         CC  depression follow up  HPI Comments: Ms. Mensing is a 61   Y/O woman with a past psychiatric history Major Depressive Disorder, severe, recurrent. Mood disorder due to [facility], Alcohol use disorder and Social Phobia.   Remain sober, still has craving for alcohol but dd not use Visitng friend soon Overall mood is fair not worse   Tolerating meds including lithium,  Lithium level reviewed normal  Aggravating factor: lnoliness modifying factor:housing, son   Duration: since young age  Timing: more so in evening   Context: alcohol use history\ Severity  manageable but alcohol craving is still there  Associated signs and symptoms: Denies psychosis   Traumatic Brain Injury: No none   Review of Systems  Cardiovascular:  Negative for palpitations.  Skin:  Negative for rash.  Neurological:  Negative for headaches.  Psychiatric/Behavioral:  Negative for depression and suicidal ideas.    Vitals:   05/18/22 1319  BP: 130/70  Temp: 98.8 F (37.1 C)  Weight: 272 lb (123.4 kg)  Height: '5\' 4"'$  (1.626 m)    Physical Exam  Constitutional: She appears well-developed and well-nourished. No distress.  Skin: She is not diaphoretic.  No tremors of hands noted while patient was interviewed.     Past Medical History: Reviewed  Past Medical History:  Diagnosis Date   Anxiety    Asthma    B12 deficiency    Bipolar 1 disorder ([place]) 2010   ect   Fibroids    Hyperglycemia    Hypertension    Plantar fasciitis 2013   Sciatica    RT hip   Vitamin D deficiency     Allergies: No Known Allergies   Current Medications: Reviewed   Current Outpatient Medications on File Prior to Visit  Medication Sig Dispense Refill   albuterol (PROAIR HFA) 108 (90 Base) MCG/ACT inhaler Inhale 2 puffs into the lungs every 6 (six)  hours as needed for wheezing or shortness of breath. 20.1 g 1   cholecalciferol (VITAMIN D) 1000 UNITS tablet Take 1,000 Units by mouth 3 (three) times a week.      lisinopril (ZESTRIL) 10 MG tablet TAKE 1 TABLET(10 MG) BY MOUTH DAILY 90 tablet 1   Multiple Vitamin (MULTIVITAMIN) tablet Take 1 tablet by mouth 3 (three) times a week.     vitamin B-12 (CYANOCOBALAMIN) 500 MCG tablet Take 500 mcg by mouth. 2-3 times per week     colchicine 0.6 MG tablet Take 2 tabs by mouth now and 1 tab in 1 hour. May repeat tomorrow if gout symptoms do not improve. (Patient not taking: Reported on 05/18/2022) 6 tablet 0   hyoscyamine (LEVBID) 0.375 MG 12 hr tablet Take 0.1875 mg by mouth daily. (Patient not taking: Reported on 05/18/2022)  0   No current facility-administered medications on file prior to visit.      Family History: Reviewed  Family History   Problem  Relation  Age of Onset     Adopted: Yes     Aneurysm  Father     Psychiatric Specialty Exam:  Objective: Appearance: casual  Eye Contact:: casual  Speech: Clear and Coherent and Normal Rate   Volume: Normal   Mood fair   Affect:congruent  Thought Process: Goal Directed, Logical and Loose   Orientation: Full   Thought Content: WDL  Suicidal Thoughts: Patient denies   Homicidal Thoughts: Patient denies   Judgement: Fair   Insight: Fair   Psychomotor Activity: Normal   Akathisia: No   Handed: Right   Memory: Immediate 3/3, Recent: 1/3   [place] of knowledge-average to above average.  AIMS (if indicated): Not indicated at this time.   Assets: Communication Skills  Desire for Improvement      Plan of Care:        Marland Kitchen   Medications and Assessment :  Prior documentation reviewed Bipolar depression: doing fair continue wellbutrin, paxil   Lithium,labs reviewed 0.9   Anxiety: fair on paxil will continue  alcohol AGT:XMIWO, remains sober, but has craving, will increase campral to bid  Meds sent discussed to  attend AA    Fu 3 months or earlier     time spent in office and face to face 20 min including chart review        Merian Capron, M.D.  05/18/2022 1:38 PM

## 2022-05-18 NOTE — Chronic Care Management (AMB) (Signed)
Chronic Care Management Pharmacy Note  05/18/2022 Name:  Julie Powell MRN:  [id number] DOB:  10-06-1961  Subjective: Julie Powell is an 61 y.o. year old female who is a primary patient of Julie Powell.  The CCM team was consulted for assistance with disease management and care coordination needs.    Engaged with patient by telephone for follow up visit in response to provider referral for pharmacy case management and/or care coordination services.   Consent to Services:  The patient was given information about Chronic Care Management services, agreed to services, and gave verbal consent prior to initiation of services.  Please see initial visit note for detailed documentation.   Patient Care Team: Julie Powell as PCP - General (Internal Medicine) Julie Powell as Consulting Physician (Psychiatry) Julie Powell (Inactive) as Consulting Physician (Endocrinology) Julie Powell as Consulting Physician (Gastroenterology) Julie Powell as Consulting Physician (Neurology) Julie Powell, Julie (Pharmacist)  Recent office visits: 01/03/2022 - Int Med Julie Powell) Follow up chronic conditions and tigger finger,middle finger. Referred to Sports Med for trigger finger. Labs checked. No medication changes.  12/01/2021 - Fam Med Julie Powell) Seen for left flank pain and hot flashes. UA negative. CT ordered. CBC, CMP and urine culture ordered.  CT showed Uterus is slightly larger than the last time and tumor on left ovary. Otherwise the CT scan was normal. Recommended appointment OBGYN   Recent consult visits: 02/02/2022 - Psychiatry (Julie Powell) No med changes noted.  01/17/2022 - Sports Med (Julie Powell) Seen for trigger middle finger of right hand. NO med changes noted. Continue to use splints.  01/11/2022 - GI Julie Powell (Julie Powell) colonoscopy and EGD.  01/05/2022 - GI Julie Powell  (Julie Powell) Seen for abdominal pain. History of gastric bypass  surgery. Ordered colonoscopy and EGD 12/28/2021 - GYN (Julie Powell) eval of CT finding of dermoid on left ovary. Reviewed history of fibroids. "As pt is post menopausal I do not expect them to change in size. The last imagining was 10 years prev. They are larger than that but pt was not PMP a the time" Small dermoid on left ovary. Asymptomatic. Reviewed tx options. Recommend observation. Recheck in 1 year. 11/23/2021 - Psychiatry (Julie Powell) Video Visit. No medication changes noted. F/U planned in 3 months 10/12/2021 - Julie Powell visits:  None in previous 6 months  Objective:  Lab Results  Component Value Date   CREATININE 1.05 (H) 12/01/2021   CREATININE 1.1 12/01/2021   CREATININE 0.95 08/31/2021    Lab Results  Component Value Date   HGBA1C 6.0 01/03/2022       Component Value Date/Time   CHOL 216 (H) 05/04/2021 1424   TRIG 82.0 05/04/2021 1424   HDL 90.90 05/04/2021 1424   CHOLHDL 2 05/04/2021 1424   VLDL 16.4 05/04/2021 1424   LDLCALC 108 (H) 05/04/2021 1424       Latest Ref Rng & Units 12/01/2021    3:58 PM 12/01/2021   12:00 AM 05/04/2021    2:24 PM  Hepatic Function  Total Protein 6.1 - 8.1 g/dL 6.8   6.8   Albumin 3.5 - 5.0  4.1     4.0   AST 10 - 35 U/L '19  19     17   ' ALT 6 - 29 U/L '13  13     18   ' Alk Phosphatase 25 - 125  135     107   Total Bilirubin 0.2 - 1.2  mg/dL 0.3   0.6      This result is from an external source.    Lab Results  Component Value Date/Time   TSH 3.73 01/03/2022 02:30 PM   TSH 2.77 05/04/2021 02:24 PM   FREET4 0.84 01/03/2022 02:30 PM   FREET4 0.70 09/22/2015 10:21 AM       Latest Ref Rng & Units 12/01/2021    3:58 PM 12/01/2021   12:00 AM 11/17/2020    5:34 PM  CBC  WBC 3.8 - 10.8 Thousand/uL 5.7  5.7     7.6   Hemoglobin 11.7 - 15.5 g/dL 11.6  11.6     13.4   Hematocrit 35.0 - 45.0 % 35.4  35     40.9   Platelets 140 - 400 Thousand/uL 248  248     269      This result is from an external source.     Lab Results  Component Value Date/Time   VD25OH 34.55 05/04/2021 02:24 PM   VD25OH 32.21 03/22/2016 05:35 PM    Clinical ASCVD: No  The 10-year ASCVD risk score (Arnett DK, et al., 2019) is: 4%   Values used to calculate the score:     Age: 68 years     Sex: Female     Is Non-Hispanic African American: Yes     Diabetic: No     Tobacco smoker: No     Systolic Blood Pressure: 507 mmHg     Is BP treated: Yes     HDL Cholesterol: 90.9 mg/dL     Total Cholesterol: 216 mg/dL    Other: See below  Social History   Tobacco Use  Smoking Status Former   Packs/day: 0.50   Years: 10.00   Total pack years: 5.00   Types: Cigarettes   Quit date: 11/07/1991   Years since quitting: 30.5  Smokeless Tobacco Never   BP Readings from Last 3 Encounters:  02/02/22 124/76  01/17/22 118/62  01/03/22 (!) 123/58   Pulse Readings from Last 3 Encounters:  01/03/22 92  12/28/21 67  12/01/21 65   Wt Readings from Last 3 Encounters:  04/20/22 267 lb (121.1 kg)  02/02/22 267 lb (121.1 kg)  01/17/22 275 lb (124.7 kg)    Assessment: Review of patient past medical history, allergies, medications, health status, including review of consultants reports, laboratory and other test data, was performed as part of comprehensive evaluation and provision of chronic care management services.    SDOH:  (Social Determinants of Health) assessments and interventions performed:      CCM Care Plan  No Known Allergies  Medications Reviewed Today     Reviewed by Julie Powell (Pharmacist) on 05/18/22 at 1128  Med List Status: <None>   Medication Order Taking? Sig Documenting Provider Last Dose Status Informant  acamprosate (CAMPRAL) 333 MG tablet [id number] Yes Take 1 tablet (333 mg total) by mouth daily. Julie Powell Taking Active   albuterol [facility] HFA) [phone] Base) MCG/ACT inhaler [id number] Yes Inhale 2 puffs into the lungs every 6 (six) hours as needed for wheezing or shortness of  breath. Julie Powell Taking Active   buPROPion [facility] Powell) 100 MG tablet [id number] Yes Take 2 tablets (200 mg total) by mouth 2 (two) times daily. TAKE 2 BID Julie Powell Taking Active   cholecalciferol (VITAMIN D) 1000 UNITS tablet [id number] Yes Take 1,000 Units by mouth 3 (three) times a week.  Provider, Historical, Powell Taking Active   colchicine  0.6 MG tablet [id number] No Take 2 tabs by mouth now and 1 tab in 1 hour. May repeat tomorrow if gout symptoms do not improve.  Patient not taking: Reported on 05/18/2022   Julie Powell Not Taking Active   hyoscyamine (LEVBID) 0.375 MG 12 hr tablet [id number] Yes Take 0.1875 mg by mouth daily. Provider, Historical, Powell Taking Active            Med Note Antony Contras, Adria Dill Jan 12, 2022 10:46 AM) GI discussed 01/11/2022 to try to wean off  lisinopril (ZESTRIL) 10 MG tablet [id number] Yes TAKE 1 TABLET(10 MG) BY MOUTH DAILY Julie Powell Taking Active   lithium carbonate (LITHOBID) 300 MG CR tablet [id number] Yes BID  Patient taking differently: Take 300 mg by mouth 2 (two) times daily. BID   Julie Powell Taking Active   Multiple Vitamin (MULTIVITAMIN) tablet [id number] Yes Take 1 tablet by mouth 3 (three) times a week. Provider, Historical, Powell Taking Active   PARoxetine (PAXIL) 30 MG tablet [id number] Yes ONE A Tyler Aas, Powell Taking Active   vitamin B-12 (CYANOCOBALAMIN) 500 MCG tablet [id number] Yes Take 500 mcg by mouth. 2-3 times per week Provider, Historical, Powell Taking Active             Patient Active Problem List   Diagnosis Date Noted   Trigger middle finger 08/31/2021   Hyperglycemia 05/06/2021   History of gout 05/04/2021   Depression 04/07/2019   Plantar fasciitis 04/07/2019   History of alcohol abuse 12/12/2016   Hyperlipidemia 05/13/2015   Bariatric surgery status 12/31/2014   Vitamin B 12 deficiency 12/31/2014   Preventative health care 11/13/2014   Multinodular goiter 08/27/2014    Vitamin D deficiency 08/13/2014   Asthma 08/13/2014   Sciatica 08/13/2014   Major depressive disorder, recurrent, severe w/o psychotic behavior ([place]) 06/11/2013   Leiomyoma uteri 06/20/2011   Hypertension 05/23/2011   Obesity (BMI 30-39.9) 05/23/2011    Immunization History  Administered Date(s) Administered   Influenza,inj,Quad PF,6+ Mos 08/12/2014, 09/22/2015, 09/13/2016, 08/07/2017, 10/29/2018, 07/09/2019, 08/31/2021   PFIZER(Purple Top)SARS-COV-2 Vaccination 07/06/2020, 07/27/2020, 03/17/2021   PNEUMOCOCCAL CONJUGATE-20 05/04/2021   Pfizer Covid-19 Vaccine Bivalent Booster 48yr & up 09/08/2021   Pneumococcal Polysaccharide-23 08/29/2011   Tdap 11/13/2014    Conditions to be addressed/monitored: HTN, Anxiety, Depression, Bipolar Disorder and elevated LFTs; alcohol use disorder; social phobia; h/o gastric bypass in 2003  Care Plan : [place](Ad[place])  Updates made by ECherre Powell Powell since 05/18/2022 12:00 AM     Problem: Medication and disease state management: Pre-DM, HTN, Asthma, Depression, Alcohol Use Disorder, Vitamin D Deficiency, Stomach Pain; elevated LFTs Resolved 05/18/2022  Priority: High  Onset Date: 03/01/2021  Note:   Current Barriers:  Chronic Disease Management support, education, and care coordination needs related to Pre-DM, HTN, HLD, Asthma, Depression, Alcohol Use Disorder, Vitamin D Deficiency, Stomach Pain; increased LFTs Patient requests medications to be synchronized.   Pharmacist Clinical Goal(s):  Over the next 180 days, patient will achieve adherence to monitoring guidelines and medication adherence to achieve therapeutic efficacy maintain control of Blood pressure as evidenced by BP <140/90  Start Campral and stop alcohol use  through collaboration with PharmD and provider. (Patient has restarted Campral)   Interventions: 1:1 collaboration with ODebbrah Alar Powell regarding development and update of comprehensive plan of care as  evidenced by provider attestation and co-signature Inter-disciplinary care team collaboration (see longitudinal plan of care) Comprehensive medication review performed; medication list updated in  electronic medical record  Hypertension (BP goal <140/90) BP Readings from Last 3 Encounters:  02/02/22 124/76  01/17/22 118/62  01/03/22 (!) 123/58  Controlled Current regimen:  Lisinopril 70m daily Checks blood pressure at home 2 to 3 times per week; reports blood pressure ranges SBP 110 to 130 and DBP 70's Interventions: Discussed blood pressure goal  Reviewed home blood pressure readings  Maintain hypertension medication regimen. Continue to check blood pressure 1 or 2 times per week and record  Cholesterol (LDL <100) Lipid Panel  Lab Results  Component Value Date   CHOL 216 (H) 05/04/2021   HDL 90.90 05/04/2021   LDLCALC 108 (H) 05/04/2021   TRIG 82.0 05/04/2021   CHOLHDL 2 05/04/2021  Last LDL was slightly above goal Current regimen:  None Interventions: Discussed cholesterol goals for LDL, HDL and Tg Discussed lifestyle changes to improve LDL - limit intake of high fat foods and increase physical activity (see below)  Pre-Diabetes(a1c goal <6.5%) Lab Results  Component Value Date   HGBA1C 6.0 01/03/2022  Patient has questions about diagnosis of hyperglycemia and what that mean. Current regimen:  Diet and exercise management  Interventions: Discussed A1c goal; Reviewed A1c cut off for pre diabetes / hyperglycemia an diabetes.  Reviewed diet - discussed limiting intake of sugar containing foods / sweets; consider option that have less sugar / no sugar - sugar free popsicles, pudding, fruit or low sugar yogurt. Also discussed limiting intake of high carbohydrate food like potatoes, rice, bread and pasta.   Alcohol Use Disorder Improved Current regimen:  Campral 334mtwice daily (per patient she is only taking once daily) Managed by Julie AkDe Powell last visit at behavioral  health 11/2021 Patient has been sober for 1 year Interventions: Continue Camprel Continue to follow up with Julie AkDe Nursend therapist Contact providers with needed support  Mood / anxiety:  Pharmacist Clinical Goal(s): Over the next 90 days, patient will work with PharmD and providers to improve mood and anxiety Current treatment: Lithium 300g twice a day Paroxetine 3060maily Bupropion 100m34mtake 2 tablets = 200mg3mce a day Interventions Recommended she take second dose of bupropion before 4pm if she is having trouble sleeping. Sometimes taking bupropion later in day can affect sleep.  Continue current medication regimen Continue regular follow up with Julie AkhtaDe Nursetherapist  Medication management / Health Maintenance:  Current pharmacy: [facility] completed colonoscopy 01/11/2022  Interventions Comprehensive medication review performed. Medication list updated.  Reviewed medication refill history and discussed adherence.   Patient Goals/Self-Care Activities Over the next 180 days, patient will:  take medications as prescribed,  check blood pressure 1 to 2 times per week, document, and provide at future appointments,  Increase physical activity. Recommend setting alarm / reminder for exercise every day. Start with 5 to 10 minutes of stepper or other exercise and work up as able. Target a minimum of 150 minutes of moderate intensity exercise per week. Limit intake of sugar containing foods / sweets; consider options that have less sugar / no sugar - sugar free popsicles, pudding, fruit or low sugar yogurt. Also recommend limiting intake of high carbohydrate food like potatoes, rice, bread and pasta. Continue to eat at least 5 servings of vegetables every day.  Follow Up Plan: No further follow up required: Patient has met medication related goals.       Medication Assistance: None required.  Patient affirms current coverage meets needs.  Patient's preferred pharmacy  is:  [id number]  [place], [place] - [address] [place] [address] Phone: [phone] Fax: [phone]   Follow Up:  Patient agrees to Care Plan and Follow-up.  Plan: No further follow up required: Patient has met medication related goals at this time  Patient is aware she can re-engage with Clinical Pharmacist Practitioner at any time if need arises in the future.   Julie Powell, PharmD Clinical Pharmacist [facility]

## 2022-06-05 DIAGNOSIS — I10 Essential (primary) hypertension: Secondary | ICD-10-CM | POA: Diagnosis not present

## 2022-06-05 DIAGNOSIS — E785 Hyperlipidemia, unspecified: Secondary | ICD-10-CM

## 2022-06-24 IMAGING — MG MM DIGITAL SCREENING BILAT W/ TOMO AND CAD
6 of 12 series · 6 of 36 positions shown · non-contrast
Comparison: Previous exam(s).

ACR Breast Density Category a: The breast tissue is almost entirely
fatty.

CLINICAL DATA: Screening.

EXAM:
DIGITAL SCREENING BILATERAL MAMMOGRAM WITH TOMOSYNTHESIS AND CAD
TECHNIQUE: Bilateral screening digital craniocaudal and mediolateral oblique
mammograms were obtained. Bilateral screening digital breast
tomosynthesis was performed. The images were evaluated with
computer-aided detection.

[L CC synth-2D]
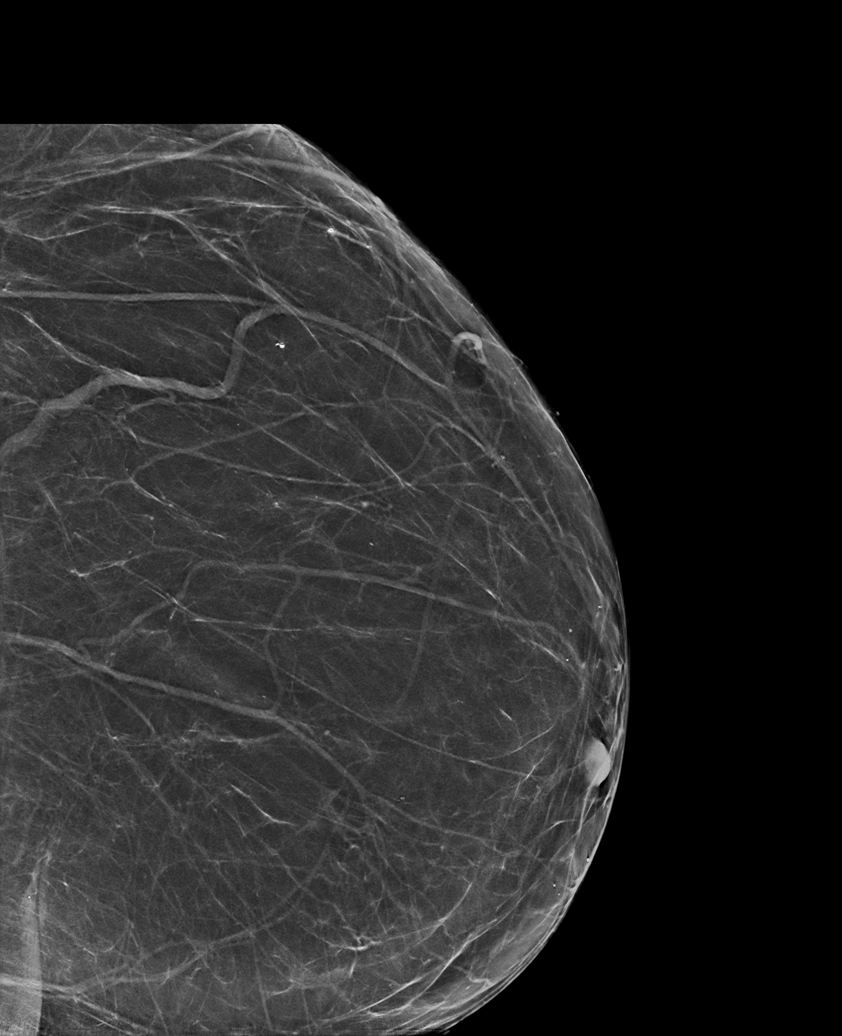

[L MLO synth-2D (1 of 2)]
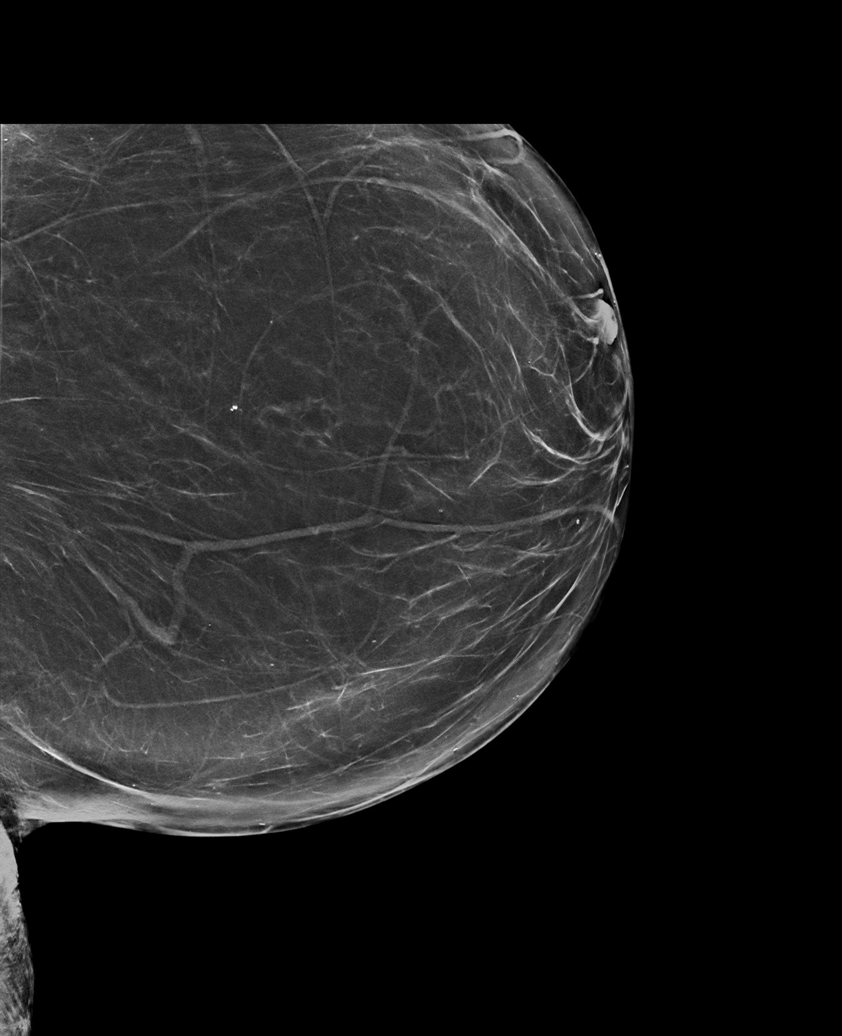

[R CV synth-2D]
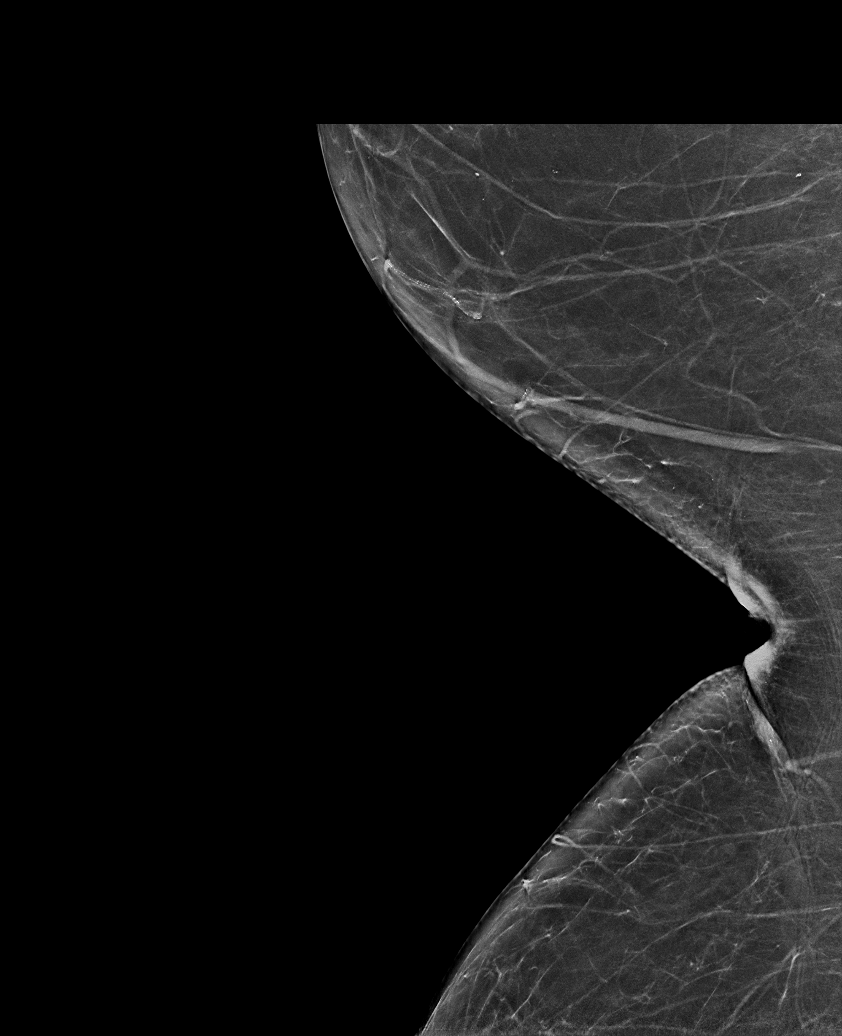

[L MLO synth-2D (2 of 2)]
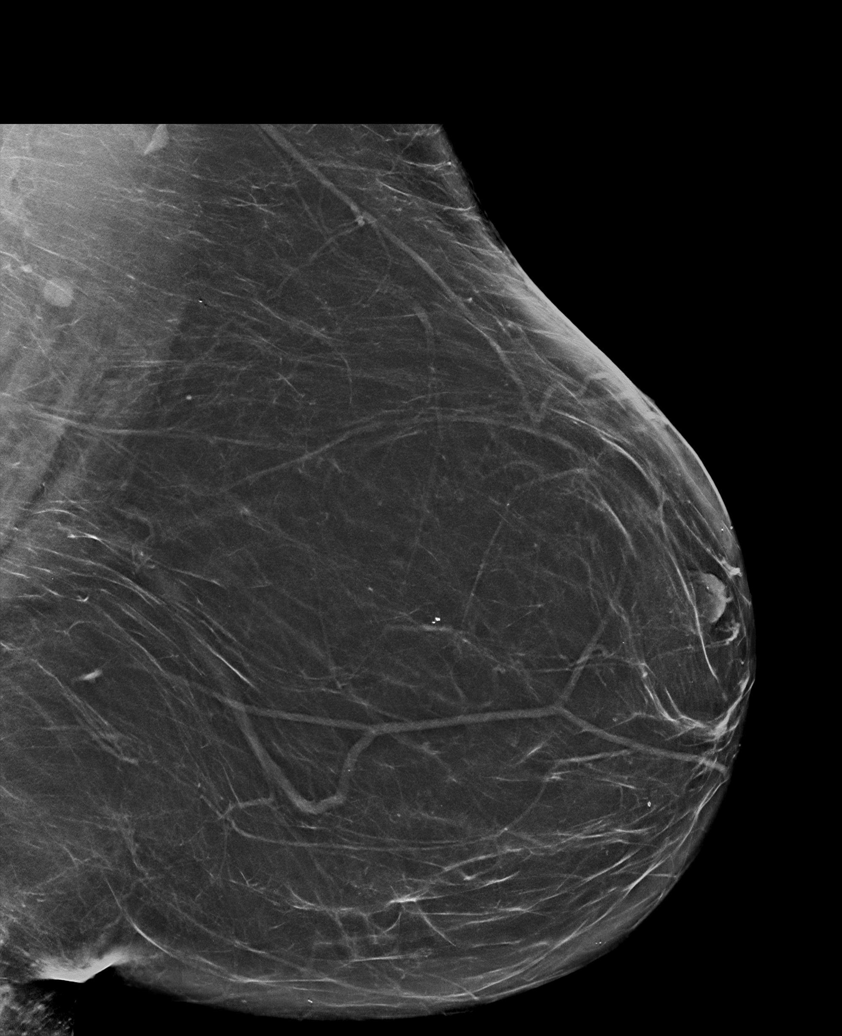

[R MLO synth-2D]
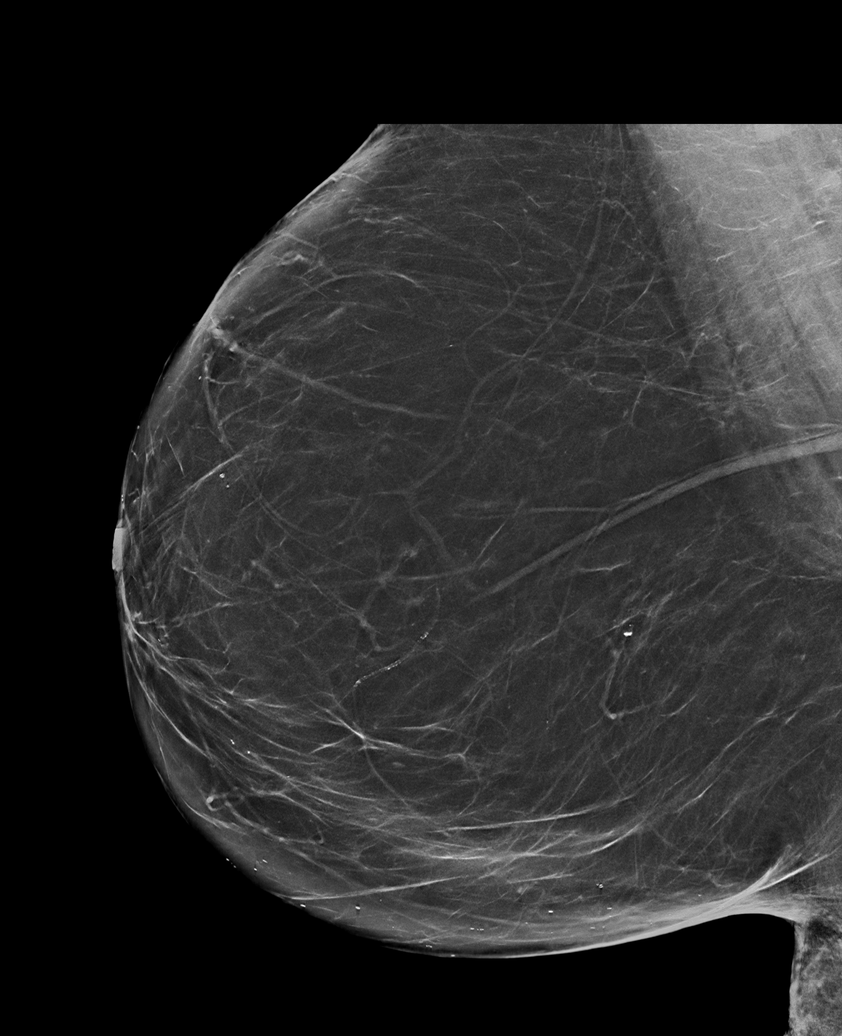

[R CC synth-2D]
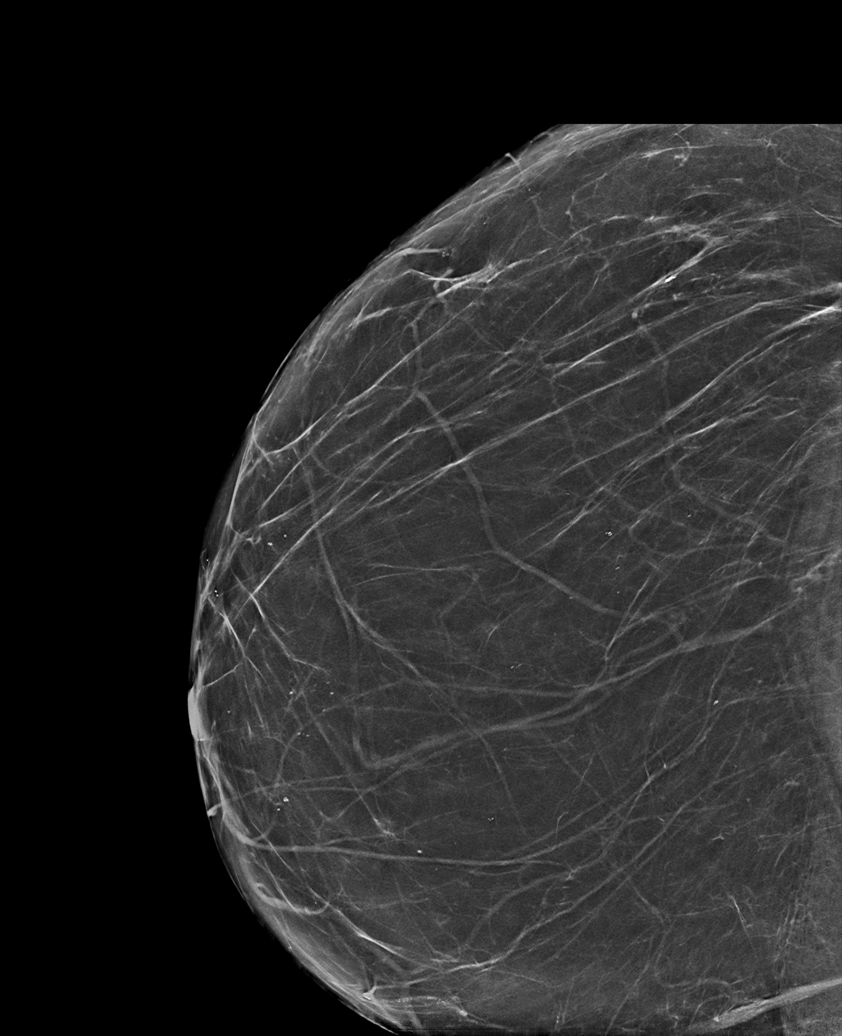

[6 of 36 positions shown; findings below may reference images not displayed]

FINDINGS: There are no findings suspicious for malignancy.
IMPRESSION: No mammographic evidence of malignancy. A result letter of this
screening mammogram will be mailed directly to the patient.

RECOMMENDATION:
Screening mammogram in one year. (Code:0E-3-N98)

BI-RADS CATEGORY  1: Negative.

## 2022-07-04 ENCOUNTER — Ambulatory Visit (INDEPENDENT_AMBULATORY_CARE_PROVIDER_SITE_OTHER): Payer: Medicare Other | Admitting: Family

## 2022-07-04 VITALS — BP 131/62 | HR 72 | Temp 98.2°F | Resp 16 | Wt 273.0 lb

## 2022-07-04 DIAGNOSIS — F32A Depression, unspecified: Secondary | ICD-10-CM

## 2022-07-04 DIAGNOSIS — I10 Essential (primary) hypertension: Secondary | ICD-10-CM | POA: Diagnosis not present

## 2022-07-04 DIAGNOSIS — Z8739 Personal history of other diseases of the musculoskeletal system and connective tissue: Secondary | ICD-10-CM | POA: Diagnosis not present

## 2022-07-04 DIAGNOSIS — M79671 Pain in right foot: Secondary | ICD-10-CM

## 2022-07-04 DIAGNOSIS — E042 Nontoxic multinodular goiter: Secondary | ICD-10-CM | POA: Diagnosis not present

## 2022-07-04 DIAGNOSIS — L819 Disorder of pigmentation, unspecified: Secondary | ICD-10-CM | POA: Diagnosis not present

## 2022-07-04 MED ORDER — DOXYCYCLINE HYCLATE 100 MG PO TABS: 100.0000 mg | ORAL_TABLET | Freq: Two times a day (BID) | ORAL | 0 refills | Status: DC

## 2022-07-04 NOTE — Assessment & Plan Note (Signed)
She is concerned that it is enlarging. Will obtain follow up TFT's and follow up ultrasound.

## 2022-07-04 NOTE — Assessment & Plan Note (Signed)
Stable. Management per psychology.

## 2022-07-04 NOTE — Progress Notes (Signed)
Subjective:     Patient ID: [name], female    DOB: [birth year], 61 y.o.   MRN: [id number]  Chief Complaint  Patient presents with   Hypertension    Here for follow up   Depression    Doing well on medication   Recurrent Skin Infections    Complains of boil on left buttocks    Trigger finger    Complains of trigger middle finger   Foot Pain    Complains of pain on hill of right foot    Hypertension  Depression       Foot Pain   Patient is in today for follow up.  Trigger finger- has seen sports medicine and she has been splinting at night.  She is considering cortisone.   Boil left inner buttock- for several months.  Right foot pain- has been going on for some time.  Has been bothering her for a year.   Depression- reports that her mood is off and on. Feels well today. Has trouble getting out of the house. Feels better when she gets out of the house.    HTN- She is on lisinopril '10mg'$ .   BP Readings from Last 3 Encounters:  07/04/22 131/62  01/17/22 118/62  01/03/22 (!) 123/58   Would like dermatology referral for skin discoloration on her face.   C/o enlargement of her goiter, increased voice hoarseness and pain on the right side with swallowing.    Health Maintenance Due  Topic Date Due   Zoster Vaccines- Shingrix (1 of 2) Never done   DEXA SCAN  04/04/2019   INFLUENZA VACCINE  06/06/2022   PAP SMEAR-Modifier  07/08/2022    Past Medical History:  Diagnosis Date   Anxiety    Asthma    B12 deficiency    Bipolar 1 disorder ([place]) 2010   ect   Fibroids    Hyperglycemia    Hypertension    Plantar fasciitis 2013   Sciatica    RT hip   Vitamin D deficiency     Past Surgical History:  Procedure Laterality Date   [place]   "thyroglycol duct cyst in neck" had dysphagia, was told benign   GASTRIC BYPASS  2003   MICRODISCECTOMY LUMBAR  09/11/14   L3-L4  Dr Saintclair Halsted   PILONIDAL CYST DRAINAGE  1982   REDUCTION  MAMMAPLASTY     SP CHOLECYSTOMY  1986    Family History  Adopted: Yes  Problem Relation Age of Onset   Aneurysm Father    Alcohol abuse Father    Alcohol abuse Brother    Alcohol abuse Brother    Cancer Brother        prostate   Alcohol abuse Brother    Lung cancer Sister    Cancer Sister        lung   Thyroid disease Neg Hx    Diabetes Neg Hx    Hypertension Neg Hx     Social History   Socioeconomic History   Marital status: Divorced    Spouse name: Not on file   Number of children: 1   Years of education: Not on file   Highest education level: Bachelor's degree (e.g., BA, AB, BS)  Occupational History   Not on file  Tobacco Use   Smoking status: Former    Packs/day: 0.50    Years: 10.00    Total pack years: 5.00    Types: Cigarettes  Quit date: 11/07/1991    Years since quitting: 30.6   Smokeless tobacco: Never  Vaping Use   Vaping Use: Never used  Substance and Sexual Activity   Alcohol use: Not Currently   Drug use: Not Currently    Types: "Crack" cocaine    Comment: Caffeine- Carbonated beverage 16 ounces.   Sexual activity: Never    Birth control/protection: Post-menopausal  Other Topics Concern   Not on file  Social History Narrative   Lives with 57 year old son   She is on disability due to depression   In the past she has worked as a Education officer, community for Systems developer   Former Mudlogger   Completed bachelor's degree   Divorced.   Enjoys television   Right handed    Apartment 1st floor    Social Determinants of Health   Financial Resource Strain: Low Risk  (03/01/2021)   Overall Financial Resource Strain (CARDIA)    Difficulty of Paying Living Expenses: Not very hard  Food Insecurity: No Food Insecurity (04/18/2021)   Hunger Vital Sign    Worried About Running Out of Food in the Last Year: Never true    Ran Out of Food in the Last Year: Never true  Transportation Needs: No  Transportation Needs (04/18/2021)   PRAPARE - Hydrologist (Medical): No    Lack of Transportation (Non-Medical): No  Physical Activity: Inactive (01/12/2022)   Exercise Vital Sign    Days of Exercise per Week: 0 days    Minutes of Exercise per Session: 0 min  Stress: No Stress Concern Present (04/18/2021)   [place]    Feeling of Stress : Not at all  Social Connections: Socially Isolated (01/12/2022)   Social Connection and Isolation Panel [NHANES]    Frequency of Communication with Friends and Family: Three times a week    Frequency of Social Gatherings with Friends and Family: Once a week    Attends Religious Services: Never    Marine scientist or Organizations: No    Attends Archivist Meetings: Never    Marital Status: Divorced  Human resources officer Violence: Not At Risk (04/18/2021)   Humiliation, Afraid, Rape, and Kick questionnaire    Fear of Current or Ex-Partner: No    Emotionally Abused: No    Physically Abused: No    Sexually Abused: No    Outpatient Medications Prior to Visit  Medication Sig Dispense Refill   acamprosate (CAMPRAL) 333 MG tablet Take 1 tablet (333 mg total) by mouth 2 (two) times daily. 180 tablet 0   albuterol (PROAIR HFA) 108 (90 Base) MCG/ACT inhaler Inhale 2 puffs into the lungs every 6 (six) hours as needed for wheezing or shortness of breath. 20.1 g 1   buPROPion (WELLBUTRIN) 100 MG tablet Take 2 tablets (200 mg total) by mouth 2 (two) times daily. TAKE 2 BID 360 tablet 0   cholecalciferol (VITAMIN D) 1000 UNITS tablet Take 1,000 Units by mouth 3 (three) times a week.      colchicine 0.6 MG tablet Take 2 tabs by mouth now and 1 tab in 1 hour. May repeat tomorrow if gout symptoms do not improve. (Patient not taking: Reported on 05/18/2022) 6 tablet 0   lisinopril (ZESTRIL) 10 MG tablet TAKE 1 TABLET(10 MG) BY MOUTH DAILY 90 tablet 1   lithium  carbonate (LITHOBID) 300 MG CR tablet BID 180 tablet  0   Multiple Vitamin (MULTIVITAMIN) tablet Take 1 tablet by mouth 3 (three) times a week.     PARoxetine (PAXIL) 30 MG tablet ONE A DAY 90 tablet 0   vitamin B-12 (CYANOCOBALAMIN) 500 MCG tablet Take 500 mcg by mouth. 2-3 times per week     hyoscyamine (LEVBID) 0.375 MG 12 hr tablet Take 0.1875 mg by mouth daily. (Patient not taking: Reported on 05/18/2022)  0   No facility-administered medications prior to visit.    No Known Allergies  Review of Systems  Psychiatric/Behavioral:  Positive for depression.    See HPI     Objective:    Physical Exam Constitutional:      General: She is not in acute distress.    Appearance: Normal appearance. She is well-developed.  HENT:     Head: Normocephalic and atraumatic.     Right Ear: External ear normal.     Left Ear: External ear normal.  Eyes:     General: No scleral icterus. Neck:     Thyroid: No thyromegaly.  Cardiovascular:     Rate and Rhythm: Normal rate and regular rhythm.     Heart sounds: Normal heart sounds. No murmur heard. Pulmonary:     Effort: Pulmonary effort is normal. No respiratory distress.     Breath sounds: Normal breath sounds. No wheezing.  Musculoskeletal:     Cervical back: Neck supple.  Skin:    General: Skin is warm and dry.  Neurological:     Mental Status: She is alert and oriented to person, place, and time.  Psychiatric:        Mood and Affect: Mood normal.        Behavior: Behavior normal.        Thought Content: Thought content normal.        Judgment: Judgment normal.     BP 131/62 (BP Location: Right Arm, Patient Position: Sitting, Cuff Size: Large)   Pulse 72   Temp 98.2 F (36.8 C) (Oral)   Resp 16   Wt 273 lb (123.8 kg)   LMP 09/11/2013   SpO2 100%   BMI 46.86 kg/m  Wt Readings from Last 3 Encounters:  07/04/22 273 lb (123.8 kg)  04/20/22 267 lb (121.1 kg)  01/17/22 275 lb (124.7 kg)       Assessment & Plan:   Problem  List Items Addressed This Visit       Unprioritized   Right foot pain - Primary    Pain is in the back of the right heel lateral to achilles tendon. Suspect osteoarthritis. Recommended tylenol prn. If symptoms worsen discussed podiatry referral.       Multinodular goiter    She is concerned that it is enlarging. Will obtain follow up TFT's and follow up ultrasound.        Relevant Orders   US THYROID   TSH   T3, free   T4, free   Hypertension    bp is stable on lisinopril.       Relevant Orders   Basic metabolic panel   History of gout    Currently stable.       Depression    Stable. Management per psychology.       Other Visit Diagnoses     Discoloration of skin of face       Relevant Orders   Ambulatory referral to Dermatology       I have discontinued Gracieann Narramore's hyoscyamine. I am also having her  start on doxycycline. Additionally, I am having her maintain her multivitamin, cholecalciferol, albuterol, vitamin B-12, colchicine, lisinopril, buPROPion, PARoxetine, lithium carbonate, and acamprosate.  Meds ordered this encounter  Medications   doxycycline (VIBRA-TABS) 100 MG tablet    Sig: Take 1 tablet (100 mg total) by mouth 2 (two) times daily.    Dispense:  14 tablet    Refill:  0    Order Specific Question:   Supervising Provider    Answer:   Penni Homans A [[id number]]

## 2022-07-04 NOTE — Assessment & Plan Note (Signed)
bp is stable on lisinopril.

## 2022-07-04 NOTE — Assessment & Plan Note (Signed)
Currently stable.

## 2022-07-04 NOTE — Assessment & Plan Note (Signed)
Pain is in the back of the right heel lateral to achilles tendon. Suspect osteoarthritis. Recommended tylenol prn. If symptoms worsen discussed podiatry referral.

## 2022-07-05 LAB — BASIC METABOLIC PANEL
BUN: 22 mg/dL (ref 6–23)
CO2: 26 mEq/L (ref 19–32)
Calcium: 9.5 mg/dL (ref 8.4–10.5)
Chloride: 104 mEq/L (ref 96–112)
Creatinine, Ser: 0.98 mg/dL (ref 0.40–1.20)
GFR: 63.46 mL/min (ref >60.00)
Glucose, Bld: 79 mg/dL (ref 70–99)
Potassium: 4.6 mEq/L (ref 3.5–5.1)
Sodium: 137 mEq/L (ref 135–145)

## 2022-07-05 LAB — T3, FREE: T3, Free: 2.4 pg/mL (ref 2.3–4.2)

## 2022-07-05 LAB — T4, FREE: Free T4: 0.75 ng/dL (ref 0.60–1.60)

## 2022-07-05 LAB — TSH: TSH: 2.71 u[IU]/mL (ref 0.35–5.50)

## 2022-07-12 ENCOUNTER — Ambulatory Visit (HOSPITAL_BASED_OUTPATIENT_CLINIC_OR_DEPARTMENT_OTHER)
Admission: RE | Admit: 2022-07-12 | Discharge: 2022-07-12 | Disposition: A | Discharge status: Home / Self Care | Payer: Medicare Other | Source: Ambulatory Visit | Attending: Family | Admitting: Family

## 2022-07-12 DIAGNOSIS — E042 Nontoxic multinodular goiter: Secondary | ICD-10-CM | POA: Diagnosis not present

## 2022-07-12 DIAGNOSIS — E010 Iodine-deficiency related diffuse (endemic) goiter: Secondary | ICD-10-CM | POA: Diagnosis not present

## 2022-07-13 ENCOUNTER — Telehealth: Payer: Self-pay | Admitting: Family

## 2022-07-13 NOTE — Telephone Encounter (Signed)
See mychart.  

## 2022-07-18 DIAGNOSIS — L811 Chloasma: Secondary | ICD-10-CM | POA: Diagnosis not present

## 2022-07-27 DIAGNOSIS — H2513 Age-related nuclear cataract, bilateral: Secondary | ICD-10-CM | POA: Diagnosis not present

## 2022-07-27 DIAGNOSIS — H524 Presbyopia: Secondary | ICD-10-CM | POA: Diagnosis not present

## 2022-08-02 DIAGNOSIS — Z23 Encounter for immunization: Secondary | ICD-10-CM | POA: Diagnosis not present

## 2022-08-09 DIAGNOSIS — Z23 Encounter for immunization: Secondary | ICD-10-CM | POA: Diagnosis not present

## 2022-08-17 ENCOUNTER — Ambulatory Visit (INDEPENDENT_AMBULATORY_CARE_PROVIDER_SITE_OTHER): Payer: Medicare Other | Admitting: Psychiatry

## 2022-08-17 ENCOUNTER — Encounter (HOSPITAL_COMMUNITY): Payer: Self-pay | Admitting: Psychiatry

## 2022-08-17 VITALS — BP 122/80 | HR 99 | Temp 98.6°F | Ht 64.0 in | Wt 273.0 lb

## 2022-08-17 DIAGNOSIS — F3130 Bipolar disorder, current episode depressed, mild or moderate severity, unspecified: Secondary | ICD-10-CM | POA: Diagnosis not present

## 2022-08-17 DIAGNOSIS — F1020 Alcohol dependence, uncomplicated: Secondary | ICD-10-CM | POA: Diagnosis not present

## 2022-08-17 DIAGNOSIS — F4010 Social phobia, unspecified: Secondary | ICD-10-CM

## 2022-08-17 MED ORDER — BUPROPION HCL 100 MG PO TABS: 200.0000 mg | ORAL_TABLET | Freq: Two times a day (BID) | ORAL | 0 refills | Status: DC

## 2022-08-17 MED ORDER — LITHIUM CARBONATE ER 300 MG PO TBCR: EXTENDED_RELEASE_TABLET | ORAL | 0 refills | Status: DC

## 2022-08-17 MED ORDER — ACAMPROSATE CALCIUM 333 MG PO TBEC: 333.0000 mg | DELAYED_RELEASE_TABLET | Freq: Two times a day (BID) | ORAL | 0 refills | Status: DC

## 2022-08-17 MED ORDER — PAROXETINE HCL 30 MG PO TABS: ORAL_TABLET | ORAL | 0 refills | Status: DC

## 2022-08-17 NOTE — Progress Notes (Signed)
Patient ID: Julie Powell, female   DOB: November 09, 1960, 61 y.o.   MRN: [id number] [place] Follow-up Outpatient Visit  Julie Powell 03/25/1961         CC  depression follow up  HPI Comments: [name] is a 61   Y/O woman with a past psychiatric history Major Depressive Disorder, severe, recurrent. Mood disorder due to [facility], Alcohol use disorder and Social Phobia.   Doing fair, remains sober, campral helping Son is a good support Tries to distract from negative thoughts   Tolerating meds including lithium,  Lithium level reviewed normal  Aggravating factor: lonliness modifying factor:housing, son   Duration: since young age  Timing: more so in evening   Context: alcohol use history\ Severity  manageable but alcohol craving is still there  Associated signs and symptoms: Denies psychosis   Traumatic Brain Injury: No none   Review of Systems  Cardiovascular:  Negative for chest pain and palpitations.  Skin:  Negative for rash.  Neurological:  Negative for headaches.  Psychiatric/Behavioral:  Negative for depression and suicidal ideas.    Vitals:   08/17/22 1337  BP: 122/80  Pulse: 99  Temp: 98.6 F (37 C)  Weight: 273 lb (123.8 kg)  Height: '5\' 4"'$  (1.626 m)    Physical Exam  Constitutional: She appears well-developed and well-nourished. No distress.  Skin: She is not diaphoretic.  No tremors of hands noted while patient was interviewed.     Past Medical History: Reviewed  Past Medical History:  Diagnosis Date   Anxiety    Asthma    B12 deficiency    Bipolar 1 disorder ([place]) 2010   ect   Fibroids    Hyperglycemia    Hypertension    Plantar fasciitis 2013   Sciatica    RT hip   Vitamin D deficiency     Allergies: No Known Allergies   Current Medications: Reviewed   Current Outpatient Medications on File Prior to Visit  Medication Sig Dispense Refill   albuterol (PROAIR HFA) 108 (90 Base) MCG/ACT inhaler Inhale 2 puffs into the  lungs every 6 (six) hours as needed for wheezing or shortness of breath. 20.1 g 1   cholecalciferol (VITAMIN D) 1000 UNITS tablet Take 1,000 Units by mouth 3 (three) times a week.      colchicine 0.6 MG tablet Take 2 tabs by mouth now and 1 tab in 1 hour. May repeat tomorrow if gout symptoms do not improve. (Patient not taking: Reported on 05/18/2022) 6 tablet 0   doxycycline (VIBRA-TABS) 100 MG tablet Take 1 tablet (100 mg total) by mouth 2 (two) times daily. 14 tablet 0   lisinopril (ZESTRIL) 10 MG tablet TAKE 1 TABLET(10 MG) BY MOUTH DAILY 90 tablet 1   Multiple Vitamin (MULTIVITAMIN) tablet Take 1 tablet by mouth 3 (three) times a week.     vitamin B-12 (CYANOCOBALAMIN) 500 MCG tablet Take 500 mcg by mouth. 2-3 times per week     No current facility-administered medications on file prior to visit.      Family History: Reviewed  Family History   Problem  Relation  Age of Onset     Adopted: Yes     Aneurysm  Father     Psychiatric Specialty Exam:  Objective: Appearance: casual  Eye Contact:: casual  Speech: Clear and Coherent and Normal Rate   Volume: Normal   Mood fair   Affect:congruent  Thought Process: Goal Directed, Logical and Loose   Orientation: Full   Thought  Content: WDL   Suicidal Thoughts: Patient denies   Homicidal Thoughts: Patient denies   Judgement: Fair   Insight: Fair   Psychomotor Activity: Normal   Akathisia: No   Handed: Right   Memory: Immediate 3/3, Recent: 1/3   [place] of knowledge-average to above average.  AIMS (if indicated): Not indicated at this time.   Assets: Communication Skills  Desire for Improvement      Plan of Care:        Marland Kitchen   Medications and Assessment :  Prior documentation reviewed Bipolar depression: doing fair, continue wellbutrin, paxil and lithium   Lithium,labs reviewed 0.9   Anxiety: manageable with paxil, will continue  alcohol KGY:JEHUD, sober, discussed relapse prevention, continue campral  may think of lowering next spring   Fu 3 months or earlier     time spent in office and face to face 20 min including chart review 20 min         Browning Southwood, M.D.  08/17/2022 2:13 PM

## 2022-09-16 ENCOUNTER — Other Ambulatory Visit: Payer: Self-pay | Admitting: Family

## 2022-09-16 DIAGNOSIS — I10 Essential (primary) hypertension: Secondary | ICD-10-CM

## 2022-11-15 ENCOUNTER — Telehealth (HOSPITAL_COMMUNITY): Payer: Self-pay | Admitting: *Deleted

## 2022-11-15 MED ORDER — BUPROPION HCL 100 MG PO TABS: 200.0000 mg | ORAL_TABLET | Freq: Two times a day (BID) | ORAL | 0 refills | Status: DC

## 2022-11-15 MED ORDER — PAROXETINE HCL 30 MG PO TABS: ORAL_TABLET | ORAL | 0 refills | Status: DC

## 2022-11-15 MED ORDER — LITHIUM CARBONATE ER 300 MG PO TBCR: EXTENDED_RELEASE_TABLET | ORAL | 0 refills | Status: DC

## 2022-11-15 NOTE — Addendum Note (Signed)
Addended by: Merian Capron on: 11/15/2022 09:50 AM   Modules accepted: Orders

## 2022-11-15 NOTE — Addendum Note (Signed)
Addended by: Merian Capron on: 11/15/2022 09:51 AM   Modules accepted: Orders

## 2022-11-15 NOTE — Telephone Encounter (Signed)
Rx REFILL REQUEST lithium carbonate (LITHOBID) 300 MG CR tablet  [id number] - [place], [place] - [address]   NEXT APPT  12/21/22 LAST APPT  08/17/22

## 2022-11-15 NOTE — Telephone Encounter (Signed)
Rx REFILL REQUEST buPROPion (WELLBUTRIN) 100 MG tablet  [id number] - [place], [place] - [address]   NEXT APPT  12/21/22 LAST APPT  08/17/22

## 2022-11-15 NOTE — Telephone Encounter (Signed)
RX REFILL REQUEST buPROPion (WELLBUTRIN) 100 MG tablet  PARoxetine (PAXIL) 30 MG tablet  [id number] - [place], [place] - [address] [place] [address]   NEXT APPT  12/21/22 LAST APPT  08/17/22

## 2022-11-15 NOTE — Telephone Encounter (Signed)
buPROPion (WELLBUTRIN) 364 MG tablet   DUPLICATE REQUEST

## 2022-12-14 ENCOUNTER — Other Ambulatory Visit (HOSPITAL_BASED_OUTPATIENT_CLINIC_OR_DEPARTMENT_OTHER): Payer: Self-pay | Admitting: Family

## 2022-12-14 DIAGNOSIS — Z1231 Encounter for screening mammogram for malignant neoplasm of breast: Secondary | ICD-10-CM

## 2022-12-21 ENCOUNTER — Ambulatory Visit (HOSPITAL_COMMUNITY): Payer: Medicare Other | Admitting: Psychiatry

## 2022-12-22 ENCOUNTER — Encounter: Payer: Self-pay | Admitting: General Practice

## 2022-12-27 ENCOUNTER — Ambulatory Visit (HOSPITAL_BASED_OUTPATIENT_CLINIC_OR_DEPARTMENT_OTHER)
Admission: RE | Admit: 2022-12-27 | Discharge: 2022-12-27 | Disposition: A | Discharge status: Home / Self Care | Payer: Medicare Other | Source: Ambulatory Visit | Attending: Family | Admitting: Family

## 2022-12-27 ENCOUNTER — Encounter (HOSPITAL_BASED_OUTPATIENT_CLINIC_OR_DEPARTMENT_OTHER): Payer: Self-pay

## 2022-12-27 ENCOUNTER — Telehealth: Payer: Self-pay | Admitting: Family

## 2022-12-27 DIAGNOSIS — N6459 Other signs and symptoms in breast: Secondary | ICD-10-CM

## 2022-12-27 DIAGNOSIS — N6320 Unspecified lump in the left breast, unspecified quadrant: Secondary | ICD-10-CM

## 2022-12-27 DIAGNOSIS — Z1231 Encounter for screening mammogram for malignant neoplasm of breast: Secondary | ICD-10-CM

## 2022-12-27 NOTE — Telephone Encounter (Signed)
Called twice but no answer, lvm for patient to call back and report if the lump she felt is on the left or right brease

## 2022-12-27 NOTE — Telephone Encounter (Signed)
Pt advised it is on her left breast.

## 2022-12-27 NOTE — Telephone Encounter (Signed)
Radiology reached out to tell me pt noticed lump in breast.  Which breast? I will place diagnostic mammo order.

## 2022-12-27 NOTE — Addendum Note (Signed)
Addended by: [name] on: 12/27/2022 05:36 PM   Modules accepted: Orders

## 2023-01-01 NOTE — Progress Notes (Signed)
Subjective:   By signing my name below, I, Julie Powell, attest that this documentation has been prepared under the direction and in the presence of Julie Fillers, NP 01/02/23   Patient ID: Julie Powell, female    DOB: Jul 09, 1961, 62 y.o.   MRN: [id number]  Chief Complaint  Patient presents with   Hypertension    Here for follow up    HPI Patient is in today for a 6 month follow up.   Mammogram: She was scheduled for a screening mammogram but was encouraged to instead get a diagnostic mammogram to a small lump on her breast.   Melasma: She has been following up with her dermatologist for melasma. She feels as though her melasma has significantly improved.   Bump on buttocks: She reports that the bump on her buttocks continues to bother her. She at times has to reposition herself when sitting due to the discomfort.   Pap Smear: She reports she is due for her next pap.  Shingles: She has received the shingles vaccine and needs to get the second dose.  Blood pressure: She has been managing her blood pressure.   Asthma: She occasionally has to use her ventilator. Otherwise her asthma is well managed.   Gout: She has not had any flare ups.   Trigger finger: She reports prior symptoms have resolved. She continued to wear her splint at night but has not needed to recently.   Depression: She reports her mood has improved significantly.   Diet: She has been trying to eat well by sticking to fish, shell fish, and vegetables. She has been struggling to be consistent with a healthy diet.   Alcohol: She has not been drinking alcohol. She at times has temptations   Past Medical History:  Diagnosis Date   Anxiety    Asthma    B12 deficiency    Bipolar 1 disorder (H[facility]) 2010   ect   Fibroids    Hyperglycemia    Hypertension    Plantar fasciitis 2013   Sciatica    RT hip   Vitamin D deficiency     Past Surgical History:  Procedure Laterality Date   CESAREAN SECTION   1995   CYSTECTOMY  1998   "thyroglycol duct cyst in neck" had dysphagia, was told benign   GASTRIC BYPASS  2003   MICRODISCECTOMY LUMBAR  09/11/14   L3-L4  Dr Wynetta Emery   PILONIDAL CYST DRAINAGE  1982   REDUCTION MAMMAPLASTY     SP CHOLECYSTOMY  1986    Family History  Adopted: Yes  Problem Relation Age of Onset   Aneurysm Father    Alcohol abuse Father    Alcohol abuse Brother    Alcohol abuse Brother    Cancer Brother        prostate   Alcohol abuse Brother    Lung cancer Sister    Cancer Sister        lung   Thyroid disease Neg Hx    Diabetes Neg Hx    Hypertension Neg Hx     Social History   Socioeconomic History   Marital status: Divorced    Spouse name: Not on file   Number of children: 1   Years of education: Not on file   Highest education level: Bachelor's degree (e.g., BA, AB, BS)  Occupational History   Not on file  Tobacco Use   Smoking status: Former    Packs/day: 0.50    Years: 10.00  Total pack years: 5.00    Types: Cigarettes    Quit date: 11/07/1991    Years since quitting: 31.1   Smokeless tobacco: Never  Vaping Use   Vaping Use: Never used  Substance and Sexual Activity   Alcohol use: Not Currently   Drug use: Not Currently    Types: "Crack" cocaine    Comment: Caffeine- Carbonated beverage 16 ounces.   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   Enjoys television   Right handed    Apartment 1st floor    Social Determinants of Health   Financial Resource Strain: Low Risk  (03/01/2021)   Overall Financial Resource Strain (CARDIA)    Difficulty of Paying Living Expenses: Not very hard  Food  Insecurity: No Food Insecurity (04/18/2021)   Hunger Vital Sign    Worried About Running Out of Food in the Last Year: Never true    Ran Out of Food in the Last Year: Never true  Transportation Needs: No Transportation Needs (04/18/2021)   PRAPARE - Administrator, Civil Service (Medical): No    Lack of Transportation (Non-Medical): No  Physical Activity: Inactive (01/12/2022)   Exercise Vital Sign    Days of Exercise per Week: 0 days    Minutes of Exercise per Session: 0 min  Stress: No Stress Concern Present (04/18/2021)   [employer] of Occupational Health - Occupational Stress Questionnaire    Feeling of Stress : Not at all  Social Connections: Socially Isolated (01/12/2022)   Social Connection and Isolation Panel [NHANES]    Frequency of Communication with Friends and Family: Three times a week    Frequency of Social Gatherings with Friends and Family: Once a week    Attends Religious Services: Never    Database administrator or Organizations: No    Attends Banker Meetings: Never    Marital Status: Divorced  Catering manager Violence: Not At Risk (04/18/2021)   Humiliation, Afraid, Rape, and Kick questionnaire    Fear of Current or Ex-Partner: No    Emotionally Abused: No    Physically Abused: No    Sexually Abused: No    Outpatient Medications Prior to Visit  Medication Sig Dispense Refill   acamprosate (CAMPRAL) 333 MG tablet Take 1 tablet (333 mg total) by mouth 2 (two) times daily. 180 tablet 0   albuterol (PROAIR HFA) 108 (90 Base) MCG/ACT inhaler Inhale 2 puffs into the lungs every 6 (six) hours as needed for wheezing or shortness of breath. 20.1 g 1   buPROPion (WELLBUTRIN) 100 MG tablet Take 2 tablets (200 mg total) by mouth 2 (two) times daily. TAKE 2 BID 360 tablet 0   cholecalciferol (VITAMIN D) 1000 UNITS tablet Take 1,000 Units by mouth 3 (three) times a week.      lithium carbonate (LITHOBID) 300 MG ER tablet BID 180 tablet 0   Multiple  Vitamin (MULTIVITAMIN) tablet Take 1 tablet by mouth 3 (three) times a week.     PARoxetine (PAXIL) 30 MG tablet ONE A DAY 90 tablet 0   vitamin B-12 (CYANOCOBALAMIN) 500 MCG tablet Take  500 mcg by mouth. 2-3 times per week     lisinopril (ZESTRIL) 10 MG tablet TAKE 1 TABLET(10 MG) BY MOUTH DAILY 90 tablet 1   colchicine 0.6 MG tablet Take 2 tabs by mouth now and 1 tab in 1 hour. May repeat tomorrow if gout symptoms do not improve. (Patient not taking: Reported on 05/18/2022) 6 tablet 0   doxycycline (VIBRA-TABS) 100 MG tablet Take 1 tablet (100 mg total) by mouth 2 (two) times daily. 14 tablet 0   No facility-administered medications prior to visit.    No Known Allergies  ROS See HPI    Objective:    Physical Exam Constitutional:      General: She is not in acute distress.    Appearance: Normal appearance. She is not ill-appearing.  HENT:     Head: Normocephalic and atraumatic.     Right Ear: External ear normal.     Left Ear: External ear normal.  Eyes:     Extraocular Movements: Extraocular movements intact.     Pupils: Pupils are equal, round, and reactive to light.  Neck:     Thyroid: Thyromegaly (mild) present.  Cardiovascular:     Rate and Rhythm: Normal rate and regular rhythm.     Heart sounds: Normal heart sounds. No murmur heard.    No gallop.  Pulmonary:     Effort: Pulmonary effort is normal. No respiratory distress.     Breath sounds: Normal breath sounds. No wheezing or rales.  Skin:    General: Skin is warm and dry.  Neurological:     Mental Status: She is alert and oriented to person, place, and time.  Psychiatric:        Judgment: Judgment normal.     BP 118/66 (BP Location: Right Arm, Patient Position: Sitting, Cuff Size: Large)   Pulse 65   Temp 98.1 F (36.7 C) (Oral)   Resp 16   Wt 273 lb (123.8 kg)   LMP 09/11/2013   SpO2 99%   BMI 46.86 kg/m  Wt Readings from Last 3 Encounters:  01/02/23 273 lb (123.8 kg)  07/04/22 273 lb (123.8 kg)   04/20/22 267 lb (121.1 kg)       Assessment & Plan:  Hyperglycemia Assessment & Plan: Lab Results  Component Value Date   HGBA1C 6.0 01/03/2022   Wt Readings from Last 3 Encounters:  01/02/23 273 lb (123.8 kg)  07/04/22 273 lb (123.8 kg)  04/20/22 267 lb (121.1 kg)   Discussed healthy diet.   Orders: -     Hemoglobin A1c  History of alcohol abuse Assessment & Plan: Remains alcohol free.    Uncomplicated asthma, unspecified asthma severity, unspecified whether persistent Assessment & Plan: Stable.  Just occasional need for HS ventolin.    History of gout Assessment & Plan: No recent flares. Monitor.    Hyperlipidemia, unspecified hyperlipidemia type Assessment & Plan: Lab Results  Component Value Date   CHOL 216 (H) 05/04/2021   HDL 90.90 05/04/2021   LDLCALC 108 (H) 05/04/2021   TRIG 82.0 05/04/2021   CHOLHDL 2 05/04/2021   Not on statin, obtain follow up lipid panel.   Orders: -     Lipid panel  Primary hypertension Assessment & Plan: BP Readings from Last 3 Encounters:  01/02/23 118/66  07/04/22 131/62  01/17/22 118/62   Maintained on lisinopril 10mg . Bp stable.    Major depressive disorder, recurrent, severe w/o psychotic behavior (HCC) Assessment & Plan: Maintained on paxil and wellbutrin, mood is  OK.  She continues to follow with psychiatry.     Multinodular goiter Assessment & Plan: Lab Results  Component Value Date   TSH 2.71 07/04/2022   TSH normal, [place] was unchanged.    Vitamin B 12 deficiency Assessment & Plan: Maintained on b12 oral 2-3 times a week.   Orders: -     Vitamin B12  Vitamin D deficiency Assessment & Plan: Maintained on vit D 1000 iu once daily.   Orders: -     VITAMIN D 25 Hydroxy (Vit-D Deficiency, Fractures)  Essential (primary) hypertension -     Lisinopril; TAKE 1 TABLET(10 MG) BY MOUTH DAILY  Dispense: 90 tablet; Refill: 1     I,Rachel Rivera,acting as a scribe for Julie Fillers,  NP.,have documented all relevant documentation on the behalf of Julie Fillers, NP,as directed by  Julie Fillers, NP while in the presence of Julie Fillers, NP.   I, Julie Fillers, NP, personally preformed the services described in this documentation.  All medical record entries made by the scribe were at my direction and in my presence.  I have reviewed the chart and discharge instructions (if applicable) and agree that the record reflects my personal performance and is accurate and complete. 01/02/23   Julie Fillers, NP

## 2023-01-02 ENCOUNTER — Ambulatory Visit (INDEPENDENT_AMBULATORY_CARE_PROVIDER_SITE_OTHER): Payer: Medicare Other | Admitting: Family

## 2023-01-02 VITALS — BP 118/66 | HR 65 | Temp 98.1°F | Resp 16 | Wt 273.0 lb

## 2023-01-02 DIAGNOSIS — E042 Nontoxic multinodular goiter: Secondary | ICD-10-CM

## 2023-01-02 DIAGNOSIS — I10 Essential (primary) hypertension: Secondary | ICD-10-CM | POA: Diagnosis not present

## 2023-01-02 DIAGNOSIS — E538 Deficiency of other specified B group vitamins: Secondary | ICD-10-CM

## 2023-01-02 DIAGNOSIS — F1011 Alcohol abuse, in remission: Secondary | ICD-10-CM | POA: Diagnosis not present

## 2023-01-02 DIAGNOSIS — E785 Hyperlipidemia, unspecified: Secondary | ICD-10-CM | POA: Diagnosis not present

## 2023-01-02 DIAGNOSIS — F332 Major depressive disorder, recurrent severe without psychotic features: Secondary | ICD-10-CM

## 2023-01-02 DIAGNOSIS — Z8739 Personal history of other diseases of the musculoskeletal system and connective tissue: Secondary | ICD-10-CM | POA: Diagnosis not present

## 2023-01-02 DIAGNOSIS — J45909 Unspecified asthma, uncomplicated: Secondary | ICD-10-CM | POA: Diagnosis not present

## 2023-01-02 DIAGNOSIS — E559 Vitamin D deficiency, unspecified: Secondary | ICD-10-CM

## 2023-01-02 DIAGNOSIS — R739 Hyperglycemia, unspecified: Secondary | ICD-10-CM | POA: Diagnosis not present

## 2023-01-02 MED ORDER — LISINOPRIL 10 MG PO TABS: ORAL_TABLET | ORAL | 1 refills | Status: DC

## 2023-01-02 NOTE — Assessment & Plan Note (Signed)
No recent flares. Monitor.

## 2023-01-02 NOTE — Patient Instructions (Addendum)
Please schedule annual GYN appointment and Shingrix #2 at your pharmacy.

## 2023-01-02 NOTE — Assessment & Plan Note (Signed)
Maintained on b12 oral 589mg 2-3 times a week.

## 2023-01-02 NOTE — Assessment & Plan Note (Signed)
Lab Results  Component Value Date   CHOL 216 (H) 05/04/2021   HDL 90.90 05/04/2021   LDLCALC 108 (H) 05/04/2021   TRIG 82.0 05/04/2021   CHOLHDL 2 05/04/2021   Not on statin, obtain follow up lipid panel.

## 2023-01-02 NOTE — Assessment & Plan Note (Signed)
Maintained on paxil and wellbutrin, mood is OK.  She continues to follow with psychiatry.

## 2023-01-02 NOTE — Assessment & Plan Note (Signed)
BP Readings from Last 3 Encounters:  01/02/23 118/66  07/04/22 131/62  01/17/22 118/62   Maintained on lisinopril '10mg'$ . Bp stable.

## 2023-01-02 NOTE — Assessment & Plan Note (Signed)
Remains alcohol free.

## 2023-01-02 NOTE — Assessment & Plan Note (Signed)
Stable.  Just occasional need for HS ventolin.

## 2023-01-02 NOTE — Assessment & Plan Note (Signed)
Lab Results  Component Value Date   TSH 2.71 07/04/2022   TSH normal, [place] was unchanged.

## 2023-01-02 NOTE — Assessment & Plan Note (Signed)
Maintained on vit D 1000 iu once daily.

## 2023-01-02 NOTE — Assessment & Plan Note (Signed)
Lab Results  Component Value Date   HGBA1C 6.0 01/03/2022   Wt Readings from Last 3 Encounters:  01/02/23 273 lb (123.8 kg)  07/04/22 273 lb (123.8 kg)  04/20/22 267 lb (121.1 kg)   Discussed healthy diet.

## 2023-01-03 ENCOUNTER — Telehealth: Payer: Self-pay | Admitting: Family

## 2023-01-03 LAB — HEMOGLOBIN A1C: Hgb A1c MFr Bld: 5.9 % (ref 4.6–6.5)

## 2023-01-03 LAB — LIPID PANEL
Cholesterol: 198 mg/dL (ref 0–200)
HDL: 92.5 mg/dL (ref >39.00)
LDL Cholesterol: 93 mg/dL (ref 0–99)
NonHDL: 105.7
Total CHOL/HDL Ratio: 2
Triglycerides: 62 mg/dL (ref 0.0–149.0)
VLDL: 12.4 mg/dL (ref 0.0–40.0)

## 2023-01-03 LAB — VITAMIN B12: Vitamin B-12: 374 pg/mL (ref 211–911)

## 2023-01-03 LAB — VITAMIN D 25 HYDROXY (VIT D DEFICIENCY, FRACTURES): VITD: 26.02 ng/mL — ABNORMAL LOW (ref 30.00–100.00)

## 2023-01-03 MED ORDER — VITAMIN D3 75 MCG (3000 UT) PO TABS: 1.0000 | ORAL_TABLET | Freq: Every day | ORAL | Status: DC

## 2023-01-03 MED ORDER — VITAMIN B-12 1000 MCG PO TABS: 1000.0000 ug | ORAL_TABLET | Freq: Every day | ORAL | Status: AC

## 2023-01-03 NOTE — Telephone Encounter (Signed)
Cholesterol is improved. Sugar has improved slightly. Vit d is low. Please increase vit d dosing to 3000 iu once daily. B12 is low normal. Increase otc oral b12 from 525mg to 1000 mcg daily.

## 2023-01-04 NOTE — Telephone Encounter (Signed)
Patient advised of resutls

## 2023-01-17 DIAGNOSIS — L811 Chloasma: Secondary | ICD-10-CM | POA: Diagnosis not present

## 2023-01-17 DIAGNOSIS — L720 Epidermal cyst: Secondary | ICD-10-CM | POA: Diagnosis not present

## 2023-01-25 ENCOUNTER — Ambulatory Visit (HOSPITAL_COMMUNITY): Payer: Medicare Other | Admitting: Psychiatry

## 2023-01-31 ENCOUNTER — Ambulatory Visit (HOSPITAL_COMMUNITY): Payer: Medicare Other | Admitting: Psychiatry

## 2023-02-07 DIAGNOSIS — L720 Epidermal cyst: Secondary | ICD-10-CM | POA: Diagnosis not present

## 2023-02-13 ENCOUNTER — Telehealth (HOSPITAL_COMMUNITY): Payer: Self-pay

## 2023-02-13 MED ORDER — PAROXETINE HCL 30 MG PO TABS: ORAL_TABLET | ORAL | 0 refills | Status: DC

## 2023-02-13 MED ORDER — LITHIUM CARBONATE ER 300 MG PO TBCR: EXTENDED_RELEASE_TABLET | ORAL | 0 refills | Status: DC

## 2023-02-13 MED ORDER — BUPROPION HCL 100 MG PO TABS: 200.0000 mg | ORAL_TABLET | Freq: Two times a day (BID) | ORAL | 0 refills | Status: DC

## 2023-02-13 NOTE — Telephone Encounter (Signed)
Medication refills - Faxes received from patient's [facility] for new orders of Paroxetine 30 mg, Lithium Carbonate ER 300 mg, and Bupropion 100 mg, all last ordered 11/15/22 for 90 days. Pt. returns next on 03/08/23.

## 2023-02-14 DIAGNOSIS — L858 Other specified epidermal thickening: Secondary | ICD-10-CM | POA: Diagnosis not present

## 2023-02-14 DIAGNOSIS — R222 Localized swelling, mass and lump, trunk: Secondary | ICD-10-CM | POA: Diagnosis not present

## 2023-02-14 DIAGNOSIS — L988 Other specified disorders of the skin and subcutaneous tissue: Secondary | ICD-10-CM | POA: Diagnosis not present

## 2023-02-19 ENCOUNTER — Encounter: Payer: Self-pay | Admitting: *Deleted

## 2023-02-20 ENCOUNTER — Ambulatory Visit
Admission: RE | Admit: 2023-02-20 | Discharge: 2023-02-20 | Disposition: A | Discharge status: Home / Self Care | Payer: Medicare Other | Source: Ambulatory Visit | Attending: Family | Admitting: Family

## 2023-02-20 DIAGNOSIS — N6459 Other signs and symptoms in breast: Secondary | ICD-10-CM

## 2023-02-20 DIAGNOSIS — N6320 Unspecified lump in the left breast, unspecified quadrant: Secondary | ICD-10-CM

## 2023-02-20 DIAGNOSIS — R928 Other abnormal and inconclusive findings on diagnostic imaging of breast: Secondary | ICD-10-CM | POA: Diagnosis not present

## 2023-02-20 DIAGNOSIS — N6489 Other specified disorders of breast: Secondary | ICD-10-CM | POA: Diagnosis not present

## 2023-03-02 DIAGNOSIS — Z09 Encounter for follow-up examination after completed treatment for conditions other than malignant neoplasm: Secondary | ICD-10-CM | POA: Diagnosis not present

## 2023-03-08 ENCOUNTER — Encounter (HOSPITAL_COMMUNITY): Payer: Self-pay | Admitting: Psychiatry

## 2023-03-08 ENCOUNTER — Ambulatory Visit (INDEPENDENT_AMBULATORY_CARE_PROVIDER_SITE_OTHER): Payer: Medicare Other | Admitting: Psychiatry

## 2023-03-08 VITALS — BP 128/78 | HR 67 | Temp 97.7°F | Ht 64.0 in | Wt 268.0 lb

## 2023-03-08 DIAGNOSIS — F1020 Alcohol dependence, uncomplicated: Secondary | ICD-10-CM

## 2023-03-08 DIAGNOSIS — F4010 Social phobia, unspecified: Secondary | ICD-10-CM

## 2023-03-08 DIAGNOSIS — F3130 Bipolar disorder, current episode depressed, mild or moderate severity, unspecified: Secondary | ICD-10-CM

## 2023-03-08 NOTE — Progress Notes (Signed)
Patient ID: Berdene Askari, female   DOB: 04/23/1961, 62 y.o.   MRN: [id number] [facility] Follow-up Outpatient Visit  [place] Rosamond 1961/09/08         CC  depression follow up  HPI Comments: Ms. Calloway is a 62   Y/O woman with a past psychiatric history Major Depressive Disorder, severe, recurrent. Mood disorder due to [facility], Alcohol use disorder and Social Phobia.    Doing fair, still sober, decreased craving on campral. Taking one a day  Mood is euthymic  Last lihtium level reviewed, will send another one  Aggravating factor: lonliness modifying factor:housing, son, grand baby expected   Duration: since young age  Timing: variable   Context: alcohol use history\ Severity  better   Associated signs and symptoms: Denies psychosis   Traumatic Brain Injury: No none   Review of Systems  Cardiovascular:  Negative for chest pain and palpitations.  Skin:  Negative for rash.  Neurological:  Negative for headaches.  Psychiatric/Behavioral:  Negative for depression and suicidal ideas.    Vitals:   03/08/23 1229  BP: 128/78  Pulse: 67  Temp: 97.7 F (36.5 C)  SpO2: 99%  Weight: 268 lb (121.6 kg)  Height: 5\' 4"  (1.626 m)    Physical Exam  Constitutional: She appears well-developed and well-nourished. No distress.  Skin: She is not diaphoretic.  No tremors of hands noted while patient was interviewed.     Past Medical History: Reviewed  Past Medical History:  Diagnosis Date   Anxiety    Asthma    B12 deficiency    Bipolar 1 disorder (HCC) 2010   ect   Fibroids    Hyperglycemia    Hypertension    Plantar fasciitis 2013   Sciatica    RT hip   Vitamin D deficiency     Allergies: No Known Allergies   Current Medications: Reviewed   Current Outpatient Medications on File Prior to Visit  Medication Sig Dispense Refill   acamprosate (CAMPRAL) 333 MG tablet Take 1 tablet (333 mg total) by mouth 2 (two) times daily. 180 tablet 0   albuterol  (PROAIR HFA) 108 (90 Base) MCG/ACT inhaler Inhale 2 puffs into the lungs every 6 (six) hours as needed for wheezing or shortness of breath. 20.1 g 1   buPROPion (WELLBUTRIN) 100 MG tablet Take 2 tablets (200 mg total) by mouth 2 (two) times daily. TAKE 2 BID 360 tablet 0   Cholecalciferol (VITAMIN D3) 75 MCG (3000 UT) TABS Take 1 tablet by mouth daily at 6 (six) AM. 30 tablet    cyanocobalamin (VITAMIN B12) 1000 MCG tablet Take 1 tablet (1,000 mcg total) by mouth daily.     hyoscyamine (LEVBID) 0.375 MG 12 hr tablet Take 1 tablet by mouth daily.     lisinopril (ZESTRIL) 10 MG tablet TAKE 1 TABLET(10 MG) BY MOUTH DAILY 90 tablet 1   lithium carbonate (LITHOBID) 300 MG ER tablet BID 180 tablet 0   Multiple Vitamin (MULTIVITAMIN) tablet Take 1 tablet by mouth 3 (three) times a week.     PARoxetine (PAXIL) 30 MG tablet ONE A DAY 90 tablet 0   No current facility-administered medications on file prior to visit.      Family History: Reviewed  Family History   Problem  Relation  Age of Onset     Adopted: Yes     Aneurysm  Father     Psychiatric Specialty Exam:  Objective: Appearance: casual  Eye Contact:: casual  Speech: Clear and  Coherent and Normal Rate   Volume: Normal   Mood fair   Affect:congruent  Thought Process: Goal Directed, Logical and Loose   Orientation: Full   Thought Content: WDL   Suicidal Thoughts: Patient denies   Homicidal Thoughts: Patient denies   Judgement: Fair   Insight: Fair   Psychomotor Activity: Normal   Akathisia: No   Handed: Right   Memory: Immediate 3/3, Recent: 1/3   Language-intact  Fund of knowledge-average to above average.  AIMS (if indicated): Not indicated at this time.   Assets: Communication Skills  Desire for Improvement      Plan of Care:        Marland Kitchen   Medications and Assessment :  Prior documentation reviewed  Bipolar depression: fair continue wellbutrin, paxil, lithium Will request blood work     Anxiety: fair on  paxil, will continue   alcohol use: sober, discussed sobreity   Fu 3 months or earlier     time spent in office and face to face 20 minutes including chart review         Thresa Ross, M.D.  03/08/2023 12:39 PM

## 2023-03-14 DIAGNOSIS — F3130 Bipolar disorder, current episode depressed, mild or moderate severity, unspecified: Secondary | ICD-10-CM | POA: Diagnosis not present

## 2023-03-15 LAB — TSH: TSH: 3.18 u[IU]/mL (ref 0.450–4.500)

## 2023-03-15 LAB — LITHIUM LEVEL: Lithium Lvl: 1.1 mmol/L (ref 0.5–1.2)

## 2023-04-04 ENCOUNTER — Ambulatory Visit (INDEPENDENT_AMBULATORY_CARE_PROVIDER_SITE_OTHER): Payer: Medicare Other | Admitting: Obstetrics & Gynecology

## 2023-04-04 ENCOUNTER — Encounter: Payer: Self-pay | Admitting: Obstetrics & Gynecology

## 2023-04-04 ENCOUNTER — Other Ambulatory Visit (HOSPITAL_COMMUNITY)
Admission: RE | Admit: 2023-04-04 | Discharge: 2023-04-04 | Disposition: A | Discharge status: Home / Self Care | Payer: Medicare Other | Source: Ambulatory Visit | Attending: Obstetrics & Gynecology | Admitting: Obstetrics & Gynecology

## 2023-04-04 VITALS — BP 116/66 | HR 68 | Ht 64.0 in | Wt 269.0 lb

## 2023-04-04 DIAGNOSIS — Z8659 Personal history of other mental and behavioral disorders: Secondary | ICD-10-CM

## 2023-04-04 DIAGNOSIS — Z6841 Body Mass Index (BMI) 40.0 and over, adult: Secondary | ICD-10-CM | POA: Diagnosis not present

## 2023-04-04 DIAGNOSIS — Z01419 Encounter for gynecological examination (general) (routine) without abnormal findings: Secondary | ICD-10-CM

## 2023-04-04 DIAGNOSIS — Z1339 Encounter for screening examination for other mental health and behavioral disorders: Secondary | ICD-10-CM | POA: Diagnosis not present

## 2023-04-04 DIAGNOSIS — Z1151 Encounter for screening for human papillomavirus (HPV): Secondary | ICD-10-CM | POA: Diagnosis not present

## 2023-04-04 NOTE — Progress Notes (Signed)
Subjective:     Julie Powell is a 62 y.o. female here for a routine exam.  No GYN concerns. Current complaints: reports increased weight gain and weakness of lower extremities. Has a h/o depression but, reports that she is on meds and is in a better place than prev.   Reports that she drinks a lot of diet soda and wants to know the long term risks of this. Not currently exercising but, recently bought a stepper. Awaiting its delivery.      Gynecologic History Patient's last menstrual period was 09/11/2013. Contraception: post menopausal status Last Pap: 07/09/2019. Results were: normal Last mammogram: 02/20/2023. Results were: normal  Obstetric History OB History  Gravida Para Term Preterm AB Living  1 1 1     1   SAB IAB Ectopic Multiple Live Births          1    # Outcome Date GA Lbr Len/2nd Weight Sex Delivery Anes PTL Lv  1 Term 1995 [redacted]w[redacted]d   M CS-Unspec Other N LIV    The following portions of the patient's history were reviewed and updated as appropriate: allergies, current medications, past family history, past medical history, past social history, past surgical history, and problem list.  Review of Systems Pertinent items are noted in HPI.    Objective:  BP 116/66   Pulse 68   Ht 5\' 4"  (1.626 m)   Wt 269 lb (122 kg)   LMP 09/11/2013   BMI 46.17 kg/m  General Appearance:    Alert, cooperative, no distress, appears stated age  Head:    Normocephalic, without obvious abnormality, atraumatic  Eyes:    conjunctiva/corneas clear, EOM's intact, both eyes  Ears:    Normal external ear canals, both ears  Nose:   Nares normal, septum midline, mucosa normal, no drainage    or sinus tenderness  Throat:   Lips, mucosa, and tongue normal; teeth and gums normal  Neck:   Supple, symmetrical, trachea midline, no adenopathy;    thyroid:  no enlargement/tenderness/nodules  Back:     Symmetric, no curvature, ROM normal, no CVA tenderness  Lungs:     respirations unlabored  Chest Wall:     No tenderness or deformity   Heart:    Regular rate and rhythm  Breast Exam:    No tenderness, masses, or nipple abnormality; well healed incisions from redxn mammoplasty.   Abdomen:     Soft, non-tender, bowel sounds active all four quadrants,    no masses, no organomegaly; well healed vertical incision and transverse RUQ incision.    Genitalia:    Normal female without lesion, discharge or tenderness   Cervix is almost flush with vagina. No lesionsn noted.     Extremities:   Extremities normal, atraumatic, no cyanosis or edema  Pulses:   2+ and symmetric all extremities  Skin:   Skin color, texture, turgor normal, no rashes or lesions     12/02/2021 CLINICAL DATA:  LEFT lower quadrant LEFT flank pain intermittently, suspected kidney stone   EXAM: CT ABDOMEN AND PELVIS WITH CONTRAST   TECHNIQUE: Multidetector CT imaging of the abdomen and pelvis was performed using the standard protocol following bolus administration of intravenous contrast.   RADIATION DOSE REDUCTION: This exam was performed according to the departmental dose-optimization program which includes automated exposure control, adjustment of the mA and/or kV according to patient size and/or use of iterative reconstruction technique.   CONTRAST:  OMNIPAQUE IOHEXOL 300 MG/ML SOLN IV. Dilute oral contrast.  COMPARISON:  02/21/2014   FINDINGS: Lower chest: Lung bases clear   Hepatobiliary: Gallbladder surgically absent. Liver normal appearance.   Pancreas: Normal appearance   Spleen: Normal appearance   Adrenals/Urinary Tract: Adrenal glands, kidneys, ureters, and bladder normal appearance   Stomach/Bowel: Normal appearing retrocecal appendix. Prior gastric bypass surgery. Dilated small bowel loop in the LEFT mid abdomen, unchanged, question atonic loop from prior bowel surgery, little changed from previous exam, without proximal bowel dilatation to suggest obstruction. Distal sigmoid colon and  proximal rectum demonstrate mild wall thickening question subtle colitis. Remaining bowel loops unremarkable.   Vascular/Lymphatic: Aorta normal caliber. Vascular structures patent. Few scattered normal sized mesenteric lymph nodes. No adenopathy.   Reproductive: Enlarged nodular uterus deviated to the RIGHT containing multiple leiomyomata, some of which are calcified. Uterus overall measures 10.2 x 12.0 x 9.5 cm. Largest leiomyomata measure 7.3 cm, 3.4 cm, and 4.4 cm. RIGHT ovary is not adequately visualized. A fat containing mass is seen in the LEFT adnexa measuring 2.9 x 2.8 x 2.2 cm consistent with a dermoid tumor.   Other: No free air or free fluid.  No hernia.   Musculoskeletal: Degenerative disc disease changes lumbar spine.   IMPRESSION: Enlarged nodular uterus containing multiple leiomyomata.   LEFT ovarian dermoid tumor 2.9 x 2.8 x 2.2 cm.   Mild wall thickening of the distal sigmoid colon and proximal rectum question subtle colitis.   Prior gastric bypass surgery.    02/20/2023 CLINICAL DATA:  Palpable left breast lump.   EXAM: DIGITAL DIAGNOSTIC BILATERAL MAMMOGRAM WITH TOMOSYNTHESIS; ULTRASOUND LEFT BREAST LIMITED   TECHNIQUE: Bilateral digital diagnostic mammography and breast tomosynthesis was performed.; Targeted ultrasound examination of the left breast was performed.   COMPARISON:  Previous exam(s).   ACR Breast Density Category a: The breasts are almost entirely fatty.   FINDINGS: A radiopaque BB was placed at the site of the patient's palpable lump in the lower inner quadrant of the left breast. No focal or suspicious mammographic finding is seen deep to the radiopaque BB. There are no suspicious findings in the remainder of either breast.   Targeted ultrasound is performed, showing no focal or suspicious sonographic abnormality along the 8 o'clock axis of the left breast.   IMPRESSION: 1. No mammographic evidence of malignancy in either  breast. 2. Unremarkable sonographic evaluation of the lower inner left breast.   RECOMMENDATION: 1. Clinical follow-up recommended for the palpable area of concern in the left breast. Any further workup should be based on clinical grounds. 2.  Screening mammogram in one year.(Code:SM-B-01Y)   I have discussed the findings and recommendations with the patient. If applicable, a reminder letter will be sent to the patient regarding the next appointment.   BI-RADS CATEGORY  1: Negative. Assessment:    Healthy female exam.  UTD on breast cancer and colon cancer screening.   Plan:  Julie Powell was seen today for gynecologic exam.  Diagnoses and all orders for this visit:  Well female exam with routine gynecological exam -     Cytology - PAP( [facility])  History of depression  BMI 45.0-49.9, adult (HCC)   Reviewed importance of proper hydration and risks assoc with diet soda.  Reviewed increasing strength training exercises.   Julie Powell, M.D., [facility]

## 2023-04-10 LAB — CYTOLOGY - PAP
Comment: NEGATIVE
Diagnosis: NEGATIVE
High risk HPV: NEGATIVE

## 2023-04-24 ENCOUNTER — Ambulatory Visit (INDEPENDENT_AMBULATORY_CARE_PROVIDER_SITE_OTHER): Payer: Medicare Other | Admitting: *Deleted

## 2023-04-24 DIAGNOSIS — Z0000 Encounter for general adult medical examination without abnormal findings: Secondary | ICD-10-CM

## 2023-04-24 NOTE — Progress Notes (Signed)
Subjective:   Julie Powell is a 62 y.o. female who presents for Medicare Annual (Subsequent) preventive examination.  Visit Complete: Virtual  I connected with  Julie Powell on 04/24/23 by a audio enabled telemedicine application and verified that I am speaking with the correct person using two identifiers.  Patient Location: Home  Provider Location: Office/Clinic  I discussed the limitations of evaluation and management by telemedicine. The patient expressed understanding and agreed to proceed.   Review of Systems     Cardiac Risk Factors include: advanced age (>34men, >52 women);obesity (BMI >30kg/m2);dyslipidemia;hypertension     Objective:    Today's Vitals   There is no height or weight on file to calculate BMI.     04/24/2023    2:58 PM 04/20/2022    1:44 PM 04/18/2021    1:44 PM 11/17/2020    5:12 PM 11/27/2019   11:00 AM 04/07/2019    2:20 PM 11/25/2018   11:06 AM  Advanced Directives  Does Patient Have a Medical Advance Directive? No No No No No No No  Would patient like information on creating a medical advance directive? No - Patient declined No - Patient declined No - Patient declined  No - Patient declined  No - Patient declined    Current Medications (verified) Outpatient Encounter Medications as of 04/24/2023  Medication Sig   acamprosate (CAMPRAL) 333 MG tablet Take 1 tablet (333 mg total) by mouth 2 (two) times daily.   albuterol (PROAIR HFA) 108 (90 Base) MCG/ACT inhaler Inhale 2 puffs into the lungs every 6 (six) hours as needed for wheezing or shortness of breath.   buPROPion (WELLBUTRIN) 100 MG tablet Take 2 tablets (200 mg total) by mouth 2 (two) times daily. TAKE 2 BID   Cholecalciferol (VITAMIN D3) 75 MCG (3000 UT) TABS Take 1 tablet by mouth daily at 6 (six) AM.   cyanocobalamin (VITAMIN B12) 1000 MCG tablet Take 1 tablet (1,000 mcg total) by mouth daily.   hyoscyamine (LEVBID) 0.375 MG 12 hr tablet Take 1 tablet by mouth daily.   lisinopril  (ZESTRIL) 10 MG tablet TAKE 1 TABLET(10 MG) BY MOUTH DAILY   lithium carbonate (LITHOBID) 300 MG ER tablet BID   Multiple Vitamin (MULTIVITAMIN) tablet Take 1 tablet by mouth 3 (three) times a week.   PARoxetine (PAXIL) 30 MG tablet ONE A DAY   No facility-administered encounter medications on file as of 04/24/2023.    Allergies (verified) Patient has no known allergies.   History: Past Medical History:  Diagnosis Date   Anxiety    Asthma    B12 deficiency    Bipolar 1 disorder (HCC) 2010   ect   Fibroids    Hyperglycemia    Hypertension    Plantar fasciitis 2013   Sciatica    RT hip   Vitamin D deficiency    Past Surgical History:  Procedure Laterality Date   CESAREAN SECTION  1995   CYSTECTOMY  1998   "thyroglycol duct cyst in neck" had dysphagia, was told benign   GASTRIC BYPASS  2003   MICRODISCECTOMY LUMBAR  09/11/14   L3-L4  Dr Wynetta Emery   PILONIDAL CYST DRAINAGE  1982   REDUCTION MAMMAPLASTY     SP CHOLECYSTOMY  1986   Family History  Adopted: Yes  Problem Relation Age of Onset   Aneurysm Father    Alcohol abuse Father    Lung cancer Sister    Alcohol abuse Brother    Alcohol abuse Brother  Cancer Brother        prostate   Alcohol abuse Brother    Thyroid disease Neg Hx    Diabetes Neg Hx    Hypertension Neg Hx    Social History   Socioeconomic History   Marital status: Divorced    Spouse name: Not on file   Number of children: 1   Years of education: Not on file   Highest education level: Bachelor's degree (e.g., BA, AB, BS)  Occupational History   Not on file  Tobacco Use   Smoking status: Former    Packs/day: 0.50    Years: 10.00    Additional pack years: 0.00    Total pack years: 5.00    Types: Cigarettes    Quit date: 11/07/1991    Years since quitting: 31.4   Smokeless tobacco: Never  Vaping Use   Vaping Use: Never used  Substance and Sexual Activity   Alcohol use: Not Currently   Drug use: Not Currently    Types: "Crack" cocaine     Comment: Caffeine- Carbonated beverage 16 ounces.   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   Enjoys television   Right handed    Apartment 1st floor    Social Determinants of Health   Financial Resource Strain: Low Risk  (03/01/2021)   Overall Financial Resource Strain (CARDIA)    Difficulty of Paying Living Expenses: Not very hard  Food Insecurity: No Food Insecurity (04/24/2023)   Hunger Vital Sign    Worried About Running Out of Food in the Last Year: Never true    Ran Out of Food in the Last Year: Never true  Transportation Needs: No Transportation Needs (04/24/2023)   PRAPARE - Administrator, Civil Service (Medical): No    Lack of Transportation (Non-Medical): No  Physical Activity: Inactive (04/24/2023)   Exercise Vital Sign    Days of Exercise per Week: 0 days    Minutes of Exercise per Session: 0 min  Stress: No Stress Concern Present (04/18/2021)   [employer] of Occupational Health - Occupational Stress Questionnaire    Feeling of Stress : Not at all  Social Connections: Socially Isolated (01/12/2022)   Social Connection and Isolation Panel [NHANES]    Frequency of Communication with Friends and Family: Three times a week    Frequency of Social Gatherings with Friends and Family: Once a week    Attends Religious Services: Never    Database administrator or Organizations: No    Attends Engineer, structural: Never    Marital Status: Divorced    Tobacco Counseling Counseling given: Not Answered   Clinical Intake:  Pre-visit preparation completed: Yes  Pain : No/denies pain  Nutritional  Risks: None Diabetes: No  How often do you need to have someone help you when you read instructions, pamphlets, or other written materials from your doctor or pharmacy?: 1 - Never  Interpreter Needed?: No  Information entered by :: [employer], CMA   Activities of Daily Living    04/24/2023    3:04 PM  In your present state of health,  do you have any difficulty performing the following activities:  Hearing? 0  Vision? 0  Difficulty concentrating or making decisions? 1  Comment concentrating and remembering  Walking or climbing stairs? 1  Dressing or bathing? 0  Doing errands, shopping? 0  Preparing Food and eating ? N  Using the Toilet? N  In the past six months, have you accidently leaked urine? N  Do you have problems with loss of bowel control? N  Managing your Medications? N  Managing your Finances? N  Housekeeping or managing your Housekeeping? N    Patient Care Team: Sandford Craze, NP as PCP - General (Internal Medicine) Thresa Ross, MD as Consulting Physician (Psychiatry) Romero Belling, MD (Inactive) as Consulting Physician (Endocrinology) Zachery Dakins, MD as Consulting Physician (Gastroenterology) Van Clines, MD as Consulting Physician (Neurology)  Indicate any recent Medical Services you may have received from other than [facility] providers in the past year (date may be approximate).     Assessment:   This is a routine wellness examination for Julie Powell.  Hearing/Vision screen No results found.  Dietary issues and exercise activities discussed:     Goals Addressed   None    Depression Screen    04/24/2023    3:02 PM 04/04/2023    2:06 PM 01/02/2023    1:03 PM 04/20/2022    1:45 PM 01/03/2022    2:29 PM 08/31/2021    2:12 PM 05/04/2021    2:14 PM  PHQ 2/9 Scores  PHQ - 2 Score 2 5 1 4 3 3 5   PHQ- 9 Score 10 16 1 15 12 13 15     Fall Risk    04/24/2023    2:59 PM 01/02/2023    1:04 PM 04/20/2022    1:44 PM 05/04/2021    1:56 PM 04/18/2021     1:46 PM  Fall Risk   Falls in the past year? 0 0 0 1 1  Number falls in past yr: 0 0 0 1 1  Injury with Fall? 0 0 0 1 0  Risk for fall due to : No Fall Risks No Fall Risks History of fall(s)  History of fall(s)  Follow up Falls evaluation completed Falls evaluation completed Falls evaluation completed  Falls prevention discussed    MEDICARE RISK AT HOME:  Medicare Risk at Home - 04/24/23 1506     Any stairs in or around the home? No    If so, are there any without handrails? No    Home free of loose throw rugs in walkways, pet beds, electrical cords, etc? Yes    Adequate lighting in your home to reduce risk of falls? Yes    Life alert? No    Use of a cane, walker or w/c? No    Grab bars in the bathroom? No    Shower chair or bench in shower? No    Elevated toilet seat or a handicapped toilet? No             TIMED UP AND GO:  Was the test performed?  No    Cognitive Function:    11/22/2017   11:30 AM 10/17/2016    2:12 PM  MMSE - Mini Mental State Exam  Orientation to time 5 5  Orientation to Place 5 5  Registration 3 3  Attention/ Calculation 5 5  Recall 3 2  Language- name 2 objects 2 2  Language- repeat 1 1  Language- follow 3 step command 3 3  Language- read &  follow direction 1 1  Write a sentence 1 1  Copy design 1 1  Total score 30 29        04/24/2023    3:09 PM 04/20/2022    1:56 PM  6CIT Screen  What Year? 0 points 0 points  What month? 0 points 0 points  What time? 0 points 0 points  Count back from 20 0 points 0 points  Months in reverse 0 points 0 points  Repeat phrase 6 points 0 points  Total Score 6 points 0 points    Immunizations Immunization History  Administered Date(s) Administered   Influenza,inj,Quad PF,6+ Mos 08/12/2014, 09/22/2015, 09/13/2016, 08/07/2017, 10/29/2018, 07/09/2019, 08/31/2021, 08/02/2022   PFIZER(Purple Top)SARS-COV-2 Vaccination 07/06/2020, 07/27/2020, 03/17/2021   PNEUMOCOCCAL CONJUGATE-20 05/04/2021    Pfizer Covid-19 Vaccine Bivalent Booster 40yrs & up 09/08/2021   Pneumococcal Polysaccharide-23 08/29/2011   Tdap 11/13/2014   Zoster Recombinat (Shingrix) 08/02/2022    TDAP status: Up to date  Flu Vaccine status: Up to date  Pneumococcal vaccine status: Up to date  Covid-19 vaccine status: Information provided on how to obtain vaccines.   Qualifies for Shingles Vaccine? Yes   Zostavax completed No   Shingrix Completed?: No.    Education has been provided regarding the importance of this vaccine. Patient has been advised to call insurance company to determine out of pocket expense if they have not yet received this vaccine. Advised may also receive vaccine at local pharmacy or Health Dept. Verbalized acceptance and understanding.  Screening Tests Health Maintenance  Topic Date Due   DEXA SCAN  04/04/2019   COVID-19 Vaccine (5 - 2023-24 season) 07/07/2022   Zoster Vaccines- Shingrix (2 of 2) 09/27/2022   Medicare Annual Wellness (AWV)  04/21/2023   INFLUENZA VACCINE  06/07/2023   DTaP/Tdap/Td (2 - Td or Tdap) 11/13/2024   MAMMOGRAM  02/19/2025   PAP SMEAR-Modifier  04/03/2026   Colonoscopy  01/12/2032   Hepatitis C Screening  Completed   HIV Screening  Completed   HPV VACCINES  Aged Out    Health Maintenance  Health Maintenance Due  Topic Date Due   DEXA SCAN  04/04/2019   COVID-19 Vaccine (5 - 2023-24 season) 07/07/2022   Zoster Vaccines- Shingrix (2 of 2) 09/27/2022   Medicare Annual Wellness (AWV)  04/21/2023    Colorectal cancer screening: Type of screening: Colonoscopy. Completed 01/11/22. Repeat every 10 years  Mammogram status: Completed 02/20/23. Repeat every year  Bone Density Status: not due until age 35  Lung Cancer Screening: (Low Dose CT Chest recommended if Age 49-80 years, 20 pack-year currently smoking OR have quit w/in 15years.) does not qualify.    Additional Screening:  Hepatitis C Screening: does qualify; Completed 08/09/17  Vision Screening:  Recommended annual ophthalmology exams for early detection of glaucoma and other disorders of the eye. Is the patient up to date with their annual eye exam?  Yes  Who is the provider or what is the name of the office in which the patient attends annual eye exams? [facility] If pt is not established with a provider, would they like to be referred to a provider to establish care? No .   Dental Screening: Recommended annual dental exams for proper oral hygiene   Community Resource Referral / Chronic Care Management: CRR required this visit?  Yes   CCM required this visit?  No     Plan:     I have personally reviewed and noted the following in the patient's chart:  Medical and social history Use of alcohol, tobacco or illicit drugs  Current medications and supplements including opioid prescriptions. Patient is not currently taking opioid prescriptions. Functional ability and status Nutritional status Physical activity Advanced directives List of other physicians Hospitalizations, surgeries, and ER visits in previous 12 months Vitals Screenings to include cognitive, depression, and falls Referrals and appointments  In addition, I have reviewed and discussed with patient certain preventive protocols, quality metrics, and best practice recommendations. A written personalized care plan for preventive services as well as general preventive health recommendations were provided to patient.     Donne Anon, CMA   04/24/2023   After Visit Summary: (MyChart) Due to this being a telephonic visit, the after visit summary with patients personalized plan was offered to patient via MyChart   Nurse Notes: None

## 2023-04-24 NOTE — Patient Instructions (Signed)
Julie Powell , Thank you for taking time to come for your Medicare Wellness Visit. I appreciate your ongoing commitment to your health goals. Please review the following plan we discussed and let me know if I can assist you in the future.     This is a list of the screening recommended for you and due dates:  Health Maintenance  Topic Date Due   DEXA scan (bone density measurement)  04/04/2019   COVID-19 Vaccine (5 - 2023-24 season) 07/07/2022   Zoster (Shingles) Vaccine (2 of 2) 09/27/2022   Flu Shot  06/07/2023   Medicare Annual Wellness Visit  04/23/2024   DTaP/Tdap/Td vaccine (2 - Td or Tdap) 11/13/2024   Mammogram  02/19/2025   Pap Smear  04/03/2026   Colon Cancer Screening  01/12/2032   Hepatitis C Screening  Completed   HIV Screening  Completed   HPV Vaccine  Aged Out     Next appointment: Follow up in one year for your annual wellness visit.   Preventive Care 40-64 Years, Female Preventive care refers to lifestyle choices and visits with your health care provider that can promote health and wellness. What does preventive care include? A yearly physical exam. This is also called an annual well check. Dental exams once or twice a year. Routine eye exams. Ask your health care provider how often you should have your eyes checked. Personal lifestyle choices, including: Daily care of your teeth and gums. Regular physical activity. Eating a healthy diet. Avoiding tobacco and drug use. Limiting alcohol use. Practicing safe sex. Taking low-dose aspirin daily starting at age 56. Taking vitamin and mineral supplements as recommended by your health care provider. What happens during an annual well check? The services and screenings done by your health care provider during your annual well check will depend on your age, overall health, lifestyle risk factors, and family history of disease. Counseling  Your health care provider may ask you questions about your: Alcohol  use. Tobacco use. Drug use. Emotional well-being. Home and relationship well-being. Sexual activity. Eating habits. Work and work Astron[employer]. Method of birth control. Menstrual cycle. Pregnancy history. Screening  You may have the following tests or measurements: Height, weight, and BMI. Blood pressure. Lipid and cholesterol levels. These may be checked every 5 years, or more frequently if you are over 86 years old. Skin check. Lung cancer screening. You may have this screening every year starting at age 62 if you have a 30-pack-year history of smoking and currently smoke or have quit within the past 15 years. Fecal occult blood test (FOBT) of the stool. You may have this test every year starting at age 30. Flexible sigmoidoscopy or colonoscopy. You may have a sigmoidoscopy every 5 years or a colonoscopy every 10 years starting at age 47. Hepatitis C blood test. Hepatitis B blood test. Sexually transmitted disease (STD) testing. Diabetes screening. This is done by checking your blood sugar (glucose) after you have not eaten for a while (fasting). You may have this done every 1-3 years. Mammogram. This may be done every 1-2 years. Talk to your health care provider about when you should start having regular mammograms. This may depend on whether you have a family history of breast cancer. BRCA-related cancer screening. This may be done if you have a family history of breast, ovarian, tubal, or peritoneal cancers. Pelvic exam and Pap test. This may be done every 3 years starting at age 61. Starting at age 59, this may be done every 5  years if you have a Pap test in combination with an HPV test. Bone density scan. This is done to screen for osteoporosis. You may have this scan if you are at high risk for osteoporosis. Discuss your test results, treatment options, and if necessary, the need for more tests with your health care provider. Vaccines  Your health care provider may recommend  certain vaccines, such as: Influenza vaccine. This is recommended every year. Tetanus, diphtheria, and acellular pertussis (Tdap, Td) vaccine. You may need a Td booster every 10 years. Zoster vaccine. You may need this after age 46. Pneumococcal 13-valent conjugate (PCV13) vaccine. You may need this if you have certain conditions and were not previously vaccinated. Pneumococcal polysaccharide (PPSV23) vaccine. You may need one or two doses if you smoke cigarettes or if you have certain conditions. Talk to your health care provider about which screenings and vaccines you need and how often you need them. This information is not intended to replace advice given to you by your health care provider. Make sure you discuss any questions you have with your health care provider. Document Released: 11/19/2015 Document Revised: 07/12/2016 Document Reviewed: 08/24/2015 Elsevier Interactive Patient Education  2017 [employer].    Fall Prevention in the Home Falls can cause injuries. They can happen to people of all ages. There are many things you can do to make your home safe and to help prevent falls. What can I do on the outside of my home? Regularly fix the edges of walkways and driveways and fix any cracks. Remove anything that might make you trip as you walk through a door, such as a raised step or threshold. Trim any bushes or trees on the path to your home. Use bright outdoor lighting. Clear any walking paths of anything that might make someone trip, such as rocks or tools. Regularly check to see if handrails are loose or broken. Make sure that both sides of any steps have handrails. Any raised decks and porches should have guardrails on the edges. Have any leaves, snow, or ice cleared regularly. Use sand or salt on walking paths during winter. Clean up any spills in your garage right away. This includes oil or grease spills. What can I do in the bathroom? Use night lights. Install grab bars  by the toilet and in the tub and shower. Do not use towel bars as grab bars. Use non-skid mats or decals in the tub or shower. If you need to sit down in the shower, use a plastic, non-slip stool. Keep the floor dry. Clean up any water that spills on the floor as soon as it happens. Remove soap buildup in the tub or shower regularly. Attach bath mats securely with double-sided non-slip rug tape. Do not have throw rugs and other things on the floor that can make you trip. What can I do in the bedroom? Use night lights. Make sure that you have a light by your bed that is easy to reach. Do not use any sheets or blankets that are too big for your bed. They should not hang down onto the floor. Have a firm chair that has side arms. You can use this for support while you get dressed. Do not have throw rugs and other things on the floor that can make you trip. What can I do in the kitchen? Clean up any spills right away. Avoid walking on wet floors. Keep items that you use a lot in easy-to-reach places. If you need to reach  something above you, use a strong step stool that has a grab bar. Keep electrical cords out of the way. Do not use floor polish or wax that makes floors slippery. If you must use wax, use non-skid floor wax. Do not have throw rugs and other things on the floor that can make you trip. What can I do with my stairs? Do not leave any items on the stairs. Make sure that there are handrails on both sides of the stairs and use them. Fix handrails that are broken or loose. Make sure that handrails are as long as the stairways. Check any carpeting to make sure that it is firmly attached to the stairs. Fix any carpet that is loose or worn. Avoid having throw rugs at the top or bottom of the stairs. If you do have throw rugs, attach them to the floor with carpet tape. Make sure that you have a light switch at the top of the stairs and the bottom of the stairs. If you do not have them, ask  someone to add them for you. What else can I do to help prevent falls? Wear shoes that: Do not have high heels. Have rubber bottoms. Are comfortable and fit you well. Are closed at the toe. Do not wear sandals. If you use a stepladder: Make sure that it is fully opened. Do not climb a closed stepladder. Make sure that both sides of the stepladder are locked into place. Ask someone to hold it for you, if possible. Clearly mark and make sure that you can see: Any grab bars or handrails. First and last steps. Where the edge of each step is. Use tools that help you move around (mobility aids) if they are needed. These include: Canes. Walkers. Scooters. Crutches. Turn on the lights when you go into a dark area. Replace any light bulbs as soon as they burn out. Set up your furniture so you have a clear path. Avoid moving your furniture around. If any of your floors are uneven, fix them. If there are any pets around you, be aware of where they are. Review your medicines with your doctor. Some medicines can make you feel dizzy. This can increase your chance of falling. Ask your doctor what other things that you can do to help prevent falls. This information is not intended to replace advice given to you by your health care provider. Make sure you discuss any questions you have with your health care provider. Document Released: 08/19/2009 Document Revised: 03/30/2016 Document Reviewed: 11/27/2014 Elsevier Interactive Patient Education  2017 [employer].

## 2023-05-09 DIAGNOSIS — K589 Irritable bowel syndrome without diarrhea: Secondary | ICD-10-CM | POA: Diagnosis not present

## 2023-05-09 DIAGNOSIS — R1013 Epigastric pain: Secondary | ICD-10-CM | POA: Diagnosis not present

## 2023-05-14 ENCOUNTER — Telehealth (HOSPITAL_COMMUNITY): Payer: Self-pay | Admitting: *Deleted

## 2023-05-14 MED ORDER — LITHIUM CARBONATE ER 300 MG PO TBCR: EXTENDED_RELEASE_TABLET | ORAL | 0 refills | Status: DC

## 2023-05-14 MED ORDER — BUPROPION HCL 100 MG PO TABS: 200.0000 mg | ORAL_TABLET | Freq: Two times a day (BID) | ORAL | 0 refills | Status: DC

## 2023-05-14 NOTE — Addendum Note (Signed)
Addended by: Thresa Ross on: 05/14/2023 12:47 PM   Modules accepted: Orders

## 2023-05-14 NOTE — Telephone Encounter (Signed)
REFILL REQUEST [id number] - [place], [place] - [address]   buPROPion (WELLBUTRIN) 100 MG tablet [[id number]]   Order Details Dose: 200 mg Route: Oral Frequency: 2 times daily  Dispense Quantity: 360 tablet Refills: 0        Sig: Take 2 tablets (200 mg total) by mouth 2 (two) times daily. TAKE 2 BID  LAST APPT --03/08/83 NEXT APPT -- 06/07/23

## 2023-05-14 NOTE — Telephone Encounter (Signed)
REFILL REQUEST [id number] - [place], [place] - [address]    lithium carbonate (LITHOBID) 300 MG ER tablet [[id number]]   Order Details Dose, Route, Frequency: As Directed  Dispense Quantity: 180 tablet Refills: 0        Sig: BID          Sig: Take 2 tablets (200 mg total) by mouth 2 (two) times daily. TAKE 2 BID                                                                  &&  (PAXIL) 30 MG tablet [[id number]]   Order Details Dose, Route, Frequency: As Directed  Dispense Quantity: 90 tablet Refills: 0        Sig: ONE A DAY         LAST APPT --03/08/83 NEXT APPT -- 06/07/23

## 2023-05-15 ENCOUNTER — Telehealth (HOSPITAL_COMMUNITY): Payer: Self-pay | Admitting: *Deleted

## 2023-05-15 NOTE — Telephone Encounter (Signed)
Checking 2nd Refill from Rx for PARoxetine (PAXIL) 30 MG tablet

## 2023-05-21 ENCOUNTER — Telehealth (HOSPITAL_COMMUNITY): Payer: Self-pay | Admitting: *Deleted

## 2023-05-21 MED ORDER — PAROXETINE HCL 30 MG PO TABS: ORAL_TABLET | ORAL | 0 refills | Status: DC

## 2023-05-21 NOTE — Telephone Encounter (Signed)
REFILL REQUEST [id number] - [place], [place] - [address]   Spoke with Rx last refill 02/14/23 --90 day supply  PARoxetine (PAXIL) 30 MG tablet [[id number]]   Order Details Dose, Route, Frequency: As Directed  Dispense Quantity: 90 tablet Refills: 0        Sig: ONE A DAY   Last visit 03/08/23 Next visit 06/07/23

## 2023-05-21 NOTE — Addendum Note (Signed)
Addended by: Thresa Ross on: 05/21/2023 09:52 AM   Modules accepted: Orders

## 2023-06-06 ENCOUNTER — Encounter (INDEPENDENT_AMBULATORY_CARE_PROVIDER_SITE_OTHER): Payer: Self-pay

## 2023-06-07 ENCOUNTER — Ambulatory Visit (HOSPITAL_COMMUNITY): Payer: Medicare Other | Admitting: Psychiatry

## 2023-07-04 ENCOUNTER — Ambulatory Visit (INDEPENDENT_AMBULATORY_CARE_PROVIDER_SITE_OTHER): Payer: Medicare Other | Admitting: Family

## 2023-07-04 VITALS — BP 125/55 | HR 68 | Temp 98.1°F | Resp 16 | Wt 268.0 lb

## 2023-07-04 DIAGNOSIS — E538 Deficiency of other specified B group vitamins: Secondary | ICD-10-CM

## 2023-07-04 DIAGNOSIS — I10 Essential (primary) hypertension: Secondary | ICD-10-CM | POA: Diagnosis not present

## 2023-07-04 DIAGNOSIS — M5416 Radiculopathy, lumbar region: Secondary | ICD-10-CM

## 2023-07-04 DIAGNOSIS — R739 Hyperglycemia, unspecified: Secondary | ICD-10-CM

## 2023-07-04 DIAGNOSIS — F1011 Alcohol abuse, in remission: Secondary | ICD-10-CM | POA: Diagnosis not present

## 2023-07-04 DIAGNOSIS — F32A Depression, unspecified: Secondary | ICD-10-CM | POA: Diagnosis not present

## 2023-07-04 DIAGNOSIS — E559 Vitamin D deficiency, unspecified: Secondary | ICD-10-CM

## 2023-07-04 LAB — BASIC METABOLIC PANEL
BUN: 20 mg/dL (ref 6–23)
CO2: 26 meq/L (ref 19–32)
Calcium: 9.6 mg/dL (ref 8.4–10.5)
Chloride: 108 mEq/L (ref 96–112)
Creatinine, Ser: 0.94 mg/dL (ref 0.40–1.20)
GFR: 66.25 mL/min (ref >60.00)
Glucose, Bld: 78 mg/dL (ref 70–99)
Potassium: 4.6 meq/L (ref 3.5–5.1)
Sodium: 139 meq/L (ref 135–145)

## 2023-07-04 LAB — HEMOGLOBIN A1C: Hgb A1c MFr Bld: 5.9 % (ref 4.6–6.5)

## 2023-07-04 MED ORDER — MELOXICAM 7.5 MG PO TABS
7.5000 mg | ORAL_TABLET | Freq: Every day | ORAL | 0 refills | Status: DC
Start: 2023-07-04 — End: 2024-01-08

## 2023-07-04 MED ORDER — LISINOPRIL 10 MG PO TABS
ORAL_TABLET | ORAL | 1 refills | Status: DC
Start: 2023-07-04 — End: 2024-01-30

## 2023-07-04 MED ORDER — VITAMIN D3 50 MCG (2000 UT) PO CAPS
ORAL_CAPSULE | ORAL | Status: AC
Start: 2023-07-04 — End: ?

## 2023-07-04 NOTE — Assessment & Plan Note (Signed)
Taking otc b12  PO daily. Update level.

## 2023-07-04 NOTE — Assessment & Plan Note (Addendum)
Sharp, intermittent pain likely secondary to lumbar radiculopathy. -Prescribe Meloxicam for two weeks, patient to stop if pain improves.

## 2023-07-04 NOTE — Assessment & Plan Note (Signed)
  Stable mood on current regimen of Wellbutrin and Paxil. -Continue Wellbutrin and Paxil.

## 2023-07-04 NOTE — Assessment & Plan Note (Signed)
BP Readings from Last 3 Encounters:  07/04/23 (!) 125/55  04/04/23 116/66  01/02/23 118/66   BP stable, continue lisinopril.

## 2023-07-04 NOTE — Patient Instructions (Signed)
VISIT SUMMARY:  During your recent visit, we discussed your ongoing management of depression, anxiety, and irritable bowel syndrome. You reported that your mood has been stable and your gastrointestinal symptoms have improved with your current medications. You have also abstained from alcohol, which has positively impacted your mood. You mentioned some anxiety about an upcoming family reunion, but you are determined to attend. You have been trying to maintain a healthy diet and have started using a pedal exercicer for physical activity. You also reported a sharp pain in your upper left thigh and chronic low back pain.  YOUR PLAN:  -DEPRESSION: Your mood has been stable with your current medications, Wellbutrin and Paxil. We will continue with this regimen.  -IRRITABLE BOWEL SYNDROME: Your symptoms have improved with your current medication. We will continue with this regimen.  -ALCOHOL USE: You have abstained from alcohol, which has contributed to your improved mood. Please continue to abstain from alcohol.  -DIET AND EXERCISE: You have been trying to stick to a healthy diet and have started using a pedal exerciser. Please continue these efforts.  -HYPERTENSION: You requested a refill of your Lisinopril prescription for high blood pressure. We will refill this prescription.  -LEFT THIGH PAIN: You reported sharp, intermittent pain in your upper left thigh. We will prescribe Meloxicam for two weeks to help manage this pain.  -VITAMIN D AND B12 DEFICIENCY: You have increased your intake of both vitamins. We will recheck your levels and continue your current regimen pending lab results.  -GENERAL HEALTH MAINTENANCE: We will check your A1c and kidney function. Please get a flu shot in mid-September and a COVID booster when available. We will follow up in six months or sooner if needed.  INSTRUCTIONS:  Please continue to take your medications as prescribed. Use the pedal exerciser regularly and try  to stick to your diet. Take Meloxicam for your left thigh pain for two weeks, and stop if the pain improves. Continue your current Vitamin D and B12 regimen until we get your lab results. Get a flu shot in mid-September and a COVID booster when available. We will follow up in six months or sooner if needed.

## 2023-07-04 NOTE — Assessment & Plan Note (Signed)
States she is alternating 4000 units of vitamin D with 2000 units every other day.

## 2023-07-04 NOTE — Progress Notes (Signed)
Subjective:     Patient ID: Julie Powell, female    DOB: Sep 08, 1961, 62 y.o.   MRN: [id number]  Chief Complaint  Patient presents with   Hypertension    Here for follow up    Leg Pain    Complains of left leg pain on and off    HPI  Discussed the use of AI scribe software for clinical note transcription with the patient, who gave verbal consent to proceed.  History of Present Illness   The patient, with a history of depression, anxiety, and irritable bowel syndrome, presents for a routine follow-up. She reports a stable mood on Wellbutrin and Paxil, and her gastrointestinal symptoms have improved with medication. She continues to abstain from alcohol use, which she believes has contributed to her improved mood. She is planning to attend a family reunion, her first trip out of town in 10 years, which has caused some anxiety but she is determined to go.  She has been trying to stick to a diet of proteins and vegetables, but admits to indulging in chocolate cake and diet soda. Despite this, she has lost five pounds since her last visit in February. She has purchased a seated peddler for exercise while watching tv, acknowledging that while it won't make her thin, "it will at least get her circulation going."  She reports a sharp pain in her upper left thigh, which is new and is intermittent She also has chronic low back pain, which is particularly bad when it rains. She has a history of sciatica on the right side and a previous ruptured disc that was treated with surgery.       Wt Readings from Last 3 Encounters:  07/04/23 268 lb (121.6 kg)  04/04/23 269 lb (122 kg)  01/02/23 273 lb (123.8 kg)      Health Maintenance Due  Topic Date Due   DEXA SCAN  04/04/2019   COVID-19 Vaccine (5 - 2023-24 season) 07/07/2022   INFLUENZA VACCINE  06/07/2023    Past Medical History:  Diagnosis Date   Anxiety    Asthma    B12 deficiency    Bipolar 1 disorder (HCC) 2010   ect   Fibroids     Hyperglycemia    Hypertension    Plantar fasciitis 2013   Sciatica    RT hip   Vitamin D deficiency     Past Surgical History:  Procedure Laterality Date   CESAREAN SECTION  1995   CYSTECTOMY  1998   "thyroglycol duct cyst in neck" had dysphagia, was told benign   GASTRIC BYPASS  2003   MICRODISCECTOMY LUMBAR  09/11/14   L3-L4  Dr Wynetta Emery   PILONIDAL CYST DRAINAGE  1982   REDUCTION MAMMAPLASTY     SP CHOLECYSTOMY  1986    Family History  Adopted: Yes  Problem Relation Age of Onset   Aneurysm Father    Alcohol abuse Father    Lung cancer Sister    Alcohol abuse Brother    Alcohol abuse Brother    Cancer Brother        prostate   Alcohol abuse Brother    Thyroid disease Neg Hx    Diabetes Neg Hx    Hypertension Neg Hx     Social History   Socioeconomic History   Marital status: Divorced    Spouse name: Not on file   Number of children: 1   Years of education: Not on file   Highest education level: Bachelor's degree (  e.g., BA, AB, BS)  Occupational History   Not on file  Tobacco Use   Smoking status: Former    Current packs/day: 0.00    Average packs/day: 0.5 packs/day for 10.0 years (5.0 ttl pk-yrs)    Types: Cigarettes    Start date: 11/06/1981    Quit date: 11/07/1991    Years since quitting: 31.6   Smokeless tobacco: Never  Vaping Use   Vaping status: Never Used  Substance and Sexual Activity   Alcohol use: Not Currently   Drug use: Not Currently    Types: "Crack" cocaine    Comment: Caffeine- Carbonated beverage 16 ounces.   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   Enjoys television   Right handed     Apartment 1st floor    Social Determinants of Health   Financial Resource Strain: Low Risk  (03/01/2021)   Overall Financial Resource Strain (CARDIA)    Difficulty of Paying Living Expenses: Not very hard  Food Insecurity: No Food Insecurity (04/24/2023)   Hunger Vital Sign    Worried About Running Out of Food in the Last Year: Never true    Ran Out of Food in the Last Year: Never true  Transportation Needs: No Transportation Needs (04/24/2023)   PRAPARE - Administrator, Civil Service (Medical): No    Lack of Transportation (Non-Medical): No  Physical Activity: Inactive (04/24/2023)   Exercise Vital Sign    Days of Exercise per Week: 0 days    Minutes of Exercise per Session: 0 min  Stress: No Stress Concern Present (04/18/2021)   [employer] of Occupational Health - Occupational Stress Questionnaire    Feeling of Stress : Not at all  Social Connections: Unknown (03/19/2022)   Received from [facility]   Social Network    Social Network: Not on file  Recent Concern: Social Connections - Socially Isolated (01/12/2022)   Social Connection and Isolation Panel [NHANES]    Frequency of Communication with Friends and Family: Three times a week    Frequency of Social Gatherings with Friends and Family: Once a week    Attends Religious Services: Never    Database administrator or Organizations: No    Attends Banker Meetings: Never    Marital Status: Divorced  Catering manager Violence: Not At Risk (04/24/2023)   Humiliation, Afraid, Rape, and Kick questionnaire    Fear of Current or Ex-Partner: No    Emotionally Abused: No    Physically Abused: No    Sexually Abused: No    Outpatient Medications Prior to Visit  Medication Sig Dispense Refill   acamprosate (CAMPRAL) 333 MG tablet Take 1 tablet (333 mg total) by mouth 2 (two) times daily. 180 tablet 0   albuterol (PROAIR HFA) 108 (90 Base) MCG/ACT inhaler Inhale 2 puffs into the lungs every 6  (six) hours as needed for wheezing or shortness of breath. 20.1 g 1   buPROPion (WELLBUTRIN) 100 MG tablet Take 2 tablets (200 mg total) by mouth 2 (two) times daily. TAKE 2 BID 360 tablet 0   cyanocobalamin (VITAMIN B12)  1000 MCG tablet Take 1 tablet (1,000 mcg total) by mouth daily.     hyoscyamine (LEVBID) 0.375 MG 12 hr tablet Take 1 tablet by mouth daily.     lithium carbonate (LITHOBID) 300 MG ER tablet BID 180 tablet 0   Multiple Vitamin (MULTIVITAMIN) tablet Take 1 tablet by mouth 3 (three) times a week.     PARoxetine (PAXIL) 30 MG tablet ONE A DAY 90 tablet 0   Cholecalciferol (VITAMIN D3) 75 MCG (3000 UT) TABS Take 1 tablet by mouth daily at 6 (six) AM. 30 tablet    lisinopril (ZESTRIL) 10 MG tablet TAKE 1 TABLET(10 MG) BY MOUTH DAILY 90 tablet 1   No facility-administered medications prior to visit.    No Known Allergies  ROS See HPI    Objective:    Physical Exam Constitutional:      General: She is not in acute distress.    Appearance: Normal appearance. She is well-developed.  HENT:     Head: Normocephalic and atraumatic.     Right Ear: External ear normal.     Left Ear: External ear normal.  Eyes:     General: No scleral icterus. Neck:     Thyroid: No thyromegaly.  Cardiovascular:     Rate and Rhythm: Normal rate and regular rhythm.     Heart sounds: Normal heart sounds. No murmur heard. Pulmonary:     Effort: Pulmonary effort is normal. No respiratory distress.     Breath sounds: Normal breath sounds. No wheezing.  Musculoskeletal:     Cervical back: Neck supple.  Skin:    General: Skin is warm and dry.  Neurological:     Mental Status: She is alert and oriented to person, place, and time.  Psychiatric:        Mood and Affect: Mood normal.        Behavior: Behavior normal.        Thought Content: Thought content normal.        Judgment: Judgment normal.      BP (!) 125/55 (BP Location: Right Arm, Patient Position: Sitting, Cuff Size: Large)    Pulse 68   Temp 98.1 F (36.7 C) (Oral)   Resp 16   Wt 268 lb (121.6 kg)   LMP 09/11/2013   SpO2 100%   BMI 46.00 kg/m  Wt Readings from Last 3 Encounters:  07/04/23 268 lb (121.6 kg)  04/04/23 269 lb (122 kg)  01/02/23 273 lb (123.8 kg)       Assessment & Plan:   Problem List Items Addressed This Visit       Unprioritized   Vitamin D deficiency    States she is alternating 4000 units of vitamin D with 2000 units every other day.       Relevant Medications   Cholecalciferol (VITAMIN D3) 50 MCG (2000 UT) capsule   Other Relevant Orders   Vitamin D (25 hydroxy)   Vitamin B 12 deficiency    Taking otc b12  PO daily. Update level.      Relevant Orders   B12   Left lumbar radiculopathy    Sharp, intermittent pain likely secondary to lumbar radiculopathy. -Prescribe Meloxicam for two weeks, patient to stop if pain improves.      Relevant Medications   meloxicam (MOBIC) 7.5 MG tablet   Hypertension    BP Readings from Last 3 Encounters:  07/04/23 (!) 125/55  04/04/23 116/66  01/02/23 118/66   BP stable, continue lisinopril.  Relevant Medications   lisinopril (ZESTRIL) 10 MG tablet   Hyperglycemia - Primary   Relevant Orders   HgB A1c   History of alcohol abuse    Patient reports continued abstinence. -Continue abstinence.      Depression     Stable mood on current regimen of Wellbutrin and Paxil. -Continue Wellbutrin and Paxil.      Other Visit Diagnoses     Essential (primary) hypertension       Relevant Medications   lisinopril (ZESTRIL) 10 MG tablet   Other Relevant Orders   Basic Metabolic Panel (BMET)       I have discontinued Shandy Swords's Vitamin D3. I am also having her start on meloxicam and Vitamin D3. Additionally, I am having her maintain her multivitamin, albuterol, acamprosate, cyanocobalamin, hyoscyamine, buPROPion, lithium carbonate, PARoxetine, and lisinopril.  Meds ordered this encounter  Medications    lisinopril (ZESTRIL) 10 MG tablet    Sig: TAKE 1 TABLET(10 MG) BY MOUTH DAILY    Dispense:  90 tablet    Refill:  1   meloxicam (MOBIC) 7.5 MG tablet    Sig: Take 1 tablet (7.5 mg total) by mouth daily.    Dispense:  14 tablet    Refill:  0    Order Specific Question:   Supervising Provider    Answer:   Danise Edge A [[id number]]   Cholecalciferol (VITAMIN D3) 50 MCG (2000 UT) capsule    Sig: 1-2 tabs by mouth once daily    Order Specific Question:   Supervising Provider    Answer:   Danise Edge A [[id number]]

## 2023-07-04 NOTE — Assessment & Plan Note (Signed)
Patient reports continued abstinence. -Continue abstinence.

## 2023-07-05 LAB — VITAMIN B12: Vitamin B-12: 322 pg/mL (ref 211–911)

## 2023-07-05 LAB — VITAMIN D 25 HYDROXY (VIT D DEFICIENCY, FRACTURES): VITD: 34.47 ng/mL (ref 30.00–100.00)

## 2023-07-16 DIAGNOSIS — Z23 Encounter for immunization: Secondary | ICD-10-CM | POA: Diagnosis not present

## 2023-08-02 ENCOUNTER — Ambulatory Visit (HOSPITAL_COMMUNITY): Payer: Medicare Other | Admitting: Psychiatry

## 2023-08-14 ENCOUNTER — Telehealth (HOSPITAL_COMMUNITY): Payer: Medicare Other | Admitting: Psychiatry

## 2023-08-27 ENCOUNTER — Telehealth (HOSPITAL_COMMUNITY): Payer: Medicare Other | Admitting: Psychiatry

## 2023-08-27 ENCOUNTER — Encounter (HOSPITAL_COMMUNITY): Payer: Self-pay

## 2023-08-28 ENCOUNTER — Ambulatory Visit (HOSPITAL_COMMUNITY): Payer: Medicare Other | Admitting: Psychiatry

## 2023-08-28 ENCOUNTER — Encounter (HOSPITAL_COMMUNITY): Payer: Self-pay | Admitting: Psychiatry

## 2023-08-28 DIAGNOSIS — F3130 Bipolar disorder, current episode depressed, mild or moderate severity, unspecified: Secondary | ICD-10-CM

## 2023-08-28 DIAGNOSIS — F1020 Alcohol dependence, uncomplicated: Secondary | ICD-10-CM

## 2023-08-28 DIAGNOSIS — F4010 Social phobia, unspecified: Secondary | ICD-10-CM | POA: Diagnosis not present

## 2023-08-28 DIAGNOSIS — F332 Major depressive disorder, recurrent severe without psychotic features: Secondary | ICD-10-CM

## 2023-08-28 MED ORDER — PAROXETINE HCL 30 MG PO TABS: ORAL_TABLET | ORAL | 0 refills | Status: DC

## 2023-08-28 MED ORDER — BUPROPION HCL 100 MG PO TABS: 200.0000 mg | ORAL_TABLET | Freq: Two times a day (BID) | ORAL | 0 refills | Status: DC

## 2023-08-28 MED ORDER — LITHIUM CARBONATE ER 300 MG PO TBCR: EXTENDED_RELEASE_TABLET | ORAL | 0 refills | Status: DC

## 2023-08-28 NOTE — Progress Notes (Signed)
Patient ID: Julie Powell, female   DOB: [birth year], 62 y.o.   MRN: [id number] [facility] Follow-up Outpatient Visit  Julie Powell [birth year]         CC  depression follow up  HPI Comments: Julie Powell is a 62   Y/O woman with a past psychiatric history Major Depressive Disorder, severe, recurrent. Mood disorder due to Julie Powell, Alcohol use disorder and Social Phobia.  Doing fair, remains sober.  Went to family visit in [place], got some anxiety but managed ti and did not drink   Last lihtium level reviewed, will send another one  Aggravating factor: lonliness modifying factor:housing, son grand baby expected   Duration: since young age  Timing: variable   Context: alcohol use history\ Severity  manageable   Associated signs and symptoms: Denies psychosis   Traumatic Brain Injury: No none   Review of Systems  Cardiovascular:  Negative for chest pain and palpitations.  Skin:  Negative for rash.  Neurological:  Negative for headaches.  Psychiatric/Behavioral:  Negative for depression and suicidal ideas.    There were no vitals filed for this visit.   Physical Exam  Constitutional: She appears well-developed and well-nourished. No distress.  Skin: She is not diaphoretic.  No tremors of hands noted while patient was interviewed.     Past Medical History: Reviewed  Past Medical History:  Diagnosis Date   Anxiety    Asthma    B12 deficiency    Bipolar 1 disorder (HCC) 2010   ect   Fibroids    Hyperglycemia    Hypertension    Plantar fasciitis 2013   Sciatica    RT hip   Vitamin D deficiency     Allergies: No Known Allergies   Current Medications: Reviewed   Current Outpatient Medications on File Prior to Visit  Medication Sig Dispense Refill   albuterol (PROAIR HFA) 108 (90 Base) MCG/ACT inhaler Inhale 2 puffs into the lungs every 6 (six) hours as needed for wheezing or shortness of breath. 20.1 g 1   Cholecalciferol (VITAMIN D3) 50 MCG  (2000 UT) capsule 1-2 tabs by mouth once daily     cyanocobalamin (VITAMIN B12) 1000 MCG tablet Take 1 tablet (1,000 mcg total) by mouth daily.     hyoscyamine (LEVBID) 0.375 MG 12 hr tablet Take 1 tablet by mouth daily.     lisinopril (ZESTRIL) 10 MG tablet TAKE 1 TABLET(10 MG) BY MOUTH DAILY 90 tablet 1   meloxicam (MOBIC) 7.5 MG tablet Take 1 tablet (7.5 mg total) by mouth daily. 14 tablet 0   Multiple Vitamin (MULTIVITAMIN) tablet Take 1 tablet by mouth 3 (three) times a week.     No current facility-administered medications on file prior to visit.      Family History: Reviewed  Family History   Problem  Relation  Age of Onset     Adopted: Yes     Aneurysm  Father     Psychiatric Specialty Exam:  Objective: Appearance: casual  Eye Contact:: casual  Speech: Clear and Coherent and Normal Rate   Volume: Normal   Mood fair   Affect:congruent  Thought Process: Goal Directed, Logical and Loose   Orientation: Full   Thought Content: WDL   Suicidal Thoughts: Patient denies   Homicidal Thoughts: Patient denies   Judgement: Fair   Insight: Fair   Psychomotor Activity: Normal   Akathisia: No   Handed: Right   Memory: Immediate 3/3, Recent: 1/3   Language-intact  Fund of knowledge-average to  above average.  AIMS (if indicated): Not indicated at this time.   Assets: Communication Skills  Desire for Improvement      Plan of Care:        Marland Kitchen   Medications and Assessment :  Prior documentation reviewed  Bipolar depression: fair continue wellbutirn, lithium Last level was 1.1. patient doesn't know if she took lithium that morning, we will repeat and reviewed side effects, none as of now, no tremors    Anxiety: manageable  continue paxil  alcohol use: sober, discussed relapse prevention    Fu 3 months or earlier     time spent in office and face to face 20 minutes including chart review         Julie Powell, M.D.  08/28/2023 4:12 PM

## 2023-09-20 ENCOUNTER — Telehealth: Payer: Self-pay | Admitting: Family

## 2023-09-20 NOTE — Telephone Encounter (Signed)
Prescription Request  09/20/2023  Is this a "Controlled Substance" medicine? No  LOV: 07/04/2023  What is the name of the medication or equipment?  albuterol (PROAIR HFA) 108 (90 Base) MCG/ACT inhaler  **Requesting 90 day supply**  Have you contacted your pharmacy to request a refill? Yes   Which pharmacy would you like this sent to?   [id number] - [place], [place] - [address] [place] [address] Phone: [phone] Fax: [phone]    Patient notified that their request is being sent to the clinical staff for review and that they should receive a response within 2 business days.   Please advise at Mobile There is no such number on file (mobile).

## 2023-09-21 ENCOUNTER — Other Ambulatory Visit: Payer: Self-pay

## 2023-09-21 MED ORDER — ALBUTEROL SULFATE HFA 108 (90 BASE) MCG/ACT IN AERS: 2.0000 | INHALATION_SPRAY | Freq: Four times a day (QID) | RESPIRATORY_TRACT | 1 refills | Status: DC | PRN

## 2023-09-21 NOTE — Telephone Encounter (Signed)
Rx sent 

## 2023-12-03 ENCOUNTER — Telehealth (HOSPITAL_COMMUNITY): Payer: Self-pay | Admitting: *Deleted

## 2023-12-03 MED ORDER — LITHIUM CARBONATE ER 300 MG PO TBCR: EXTENDED_RELEASE_TABLET | ORAL | 0 refills | Status: DC

## 2023-12-03 MED ORDER — BUPROPION HCL 100 MG PO TABS: 200.0000 mg | ORAL_TABLET | Freq: Two times a day (BID) | ORAL | 0 refills | Status: DC

## 2023-12-03 NOTE — Addendum Note (Signed)
Addended by: Thresa Ross on: 12/03/2023 09:12 AM   Modules accepted: Orders

## 2023-12-03 NOTE — Telephone Encounter (Signed)
REFILL REQUEST  [id number] - [place], [place] - [address]    lithium carbonate (LITHOBID) 300 MG ER tablet   buPROPion (WELLBUTRIN) 100 MG tablet    Next Visit 03/08/23 Last Visit

## 2023-12-07 ENCOUNTER — Telehealth (HOSPITAL_COMMUNITY): Payer: Self-pay | Admitting: Psychiatry

## 2023-12-07 MED ORDER — PAROXETINE HCL 30 MG PO TABS: ORAL_TABLET | ORAL | 0 refills | Status: DC

## 2023-12-07 NOTE — Telephone Encounter (Signed)
Patient called stating her pharmacy informed her that their refill request for PARoxetine (PAXIL) 30 MG tablet had been denied. Unable to locate any request for this refill in the record. Patient requests refill be sent to:   [id number] - [place], [place] - [address] (Ph: [phone])   Last ordered: 08/28/2023 - 90 tablets  Last visit: 08/28/2023  Next visit: 12/10/2022

## 2023-12-11 ENCOUNTER — Ambulatory Visit (INDEPENDENT_AMBULATORY_CARE_PROVIDER_SITE_OTHER): Payer: Medicare Other | Admitting: Psychiatry

## 2023-12-11 ENCOUNTER — Encounter (HOSPITAL_COMMUNITY): Payer: Self-pay | Admitting: Psychiatry

## 2023-12-11 VITALS — BP 131/83 | HR 73 | Ht 64.0 in | Wt 272.0 lb

## 2023-12-11 DIAGNOSIS — F4010 Social phobia, unspecified: Secondary | ICD-10-CM | POA: Diagnosis not present

## 2023-12-11 DIAGNOSIS — Z8659 Personal history of other mental and behavioral disorders: Secondary | ICD-10-CM

## 2023-12-11 DIAGNOSIS — F332 Major depressive disorder, recurrent severe without psychotic features: Secondary | ICD-10-CM

## 2023-12-11 DIAGNOSIS — F3130 Bipolar disorder, current episode depressed, mild or moderate severity, unspecified: Secondary | ICD-10-CM | POA: Diagnosis not present

## 2023-12-11 DIAGNOSIS — F1020 Alcohol dependence, uncomplicated: Secondary | ICD-10-CM | POA: Diagnosis not present

## 2023-12-11 NOTE — Progress Notes (Signed)
 Patient ID: Julie Powell, female   DOB: 1961/01/13, 63 y.o.   MRN: [id number] [facility] Follow-up Outpatient Visit  Julie Powell [birth year]         CC  depression follow up  HPI Comments: Julie Powell is a 63   Y/O woman with a past psychiatric history Major Depressive Disorder, severe, recurrent. Mood disorder due to [facility], Alcohol use disorder and Social Phobia.   Patient remains sober, mood is doing fair, at times difficult to focus,  Lihtium keeping balance still gets subdued under stress or if around people  Last lihtium level reviewed, will send another one  Aggravating factor:  lonliness modifying factor:housing, grand baby   Duration: since young age  Timing: variable   Context: alcohol use history\ Severity manageable  Associated signs and symptoms: Denies psychosis   Traumatic Brain Injury: No none   Review of Systems  Cardiovascular:  Negative for chest pain and palpitations.  Skin:  Negative for rash.  Neurological:  Negative for headaches.  Psychiatric/Behavioral:  Negative for depression and suicidal ideas.    Vitals:   12/11/23 1603  BP: 131/83  Pulse: 73  Weight: 272 lb (123.4 kg)  Height: 5' 4 (1.626 m)     Physical Exam  Constitutional: She appears well-developed and well-nourished. No distress.  Skin: She is not diaphoretic.  No tremors of hands noted while patient was interviewed.     Past Medical History: Reviewed  Past Medical History:  Diagnosis Date   Anxiety    Asthma    B12 deficiency    Bipolar 1 disorder (HCC) 2010   ect   Fibroids    Hyperglycemia    Hypertension    Plantar fasciitis 2013   Sciatica    RT hip   Vitamin D  deficiency     Allergies: No Known Allergies   Current Medications: Reviewed   Current Outpatient Medications on File Prior to Visit  Medication Sig Dispense Refill   albuterol  (PROAIR  HFA) 108 (90 Base) MCG/ACT inhaler Inhale 2 puffs into the lungs every 6 (six) hours as needed  for wheezing or shortness of breath. 20.1 g 1   buPROPion  (WELLBUTRIN ) 100 MG tablet Take 2 tablets (200 mg total) by mouth 2 (two) times daily. TAKE 2 BID 360 tablet 0   Cholecalciferol (VITAMIN D3) 50 MCG (2000 UT) capsule 1-2 tabs by mouth once daily     cyanocobalamin  (VITAMIN B12) 1000 MCG tablet Take 1 tablet (1,000 mcg total) by mouth daily.     hyoscyamine (LEVBID) 0.375 MG 12 hr tablet Take 1 tablet by mouth daily.     lisinopril  (ZESTRIL ) 10 MG tablet TAKE 1 TABLET(10 MG) BY MOUTH DAILY 90 tablet 1   lithium  carbonate (LITHOBID ) 300 MG ER tablet BID 180 tablet 0   meloxicam  (MOBIC ) 7.5 MG tablet Take 1 tablet (7.5 mg total) by mouth daily. 14 tablet 0   Multiple Vitamin (MULTIVITAMIN) tablet Take 1 tablet by mouth 3 (three) times a week.     PARoxetine  (PAXIL ) 30 MG tablet ONE A DAY 90 tablet 0   No current facility-administered medications on file prior to visit.      Family History: Reviewed  Family History   Problem  Relation  Age of Onset     Adopted: Yes     Aneurysm  Father     Psychiatric Specialty Exam:  Objective: Appearance: casual  Eye Contact:: casual  Speech: Clear and Coherent and Normal Rate   Volume: Normal   Mood  fair   Affect:congruent  Thought Process: Goal Directed, Logical and Loose   Orientation: Full   Thought Content: WDL   Suicidal Thoughts: Patient denies   Homicidal Thoughts: Patient denies   Judgement: Fair   Insight: Fair   Psychomotor Activity: Normal   Akathisia: No   Handed: Right   Memory: Immediate 3/3, Recent: 1/3   Language-intact  Fund of knowledge-average to above average.  AIMS (if indicated): Not indicated at this time.   Assets: Communication Skills  Desire for Improvement      Plan of Care:        [name]   Medications and Assessment :  Prior documentation reviewed  Bipolar depression:depressed; continue meds, keeping balance, avoid triggers or stressors Will send lihtium level for lab  Needed paperwork for  insurance, done today    Anxiety: also in social situations: continue paxil  and work in therapy if needed. Avoid trgigers  alcohol use: sober, relapse prevention discussed and handling it fair without campral     Fu 3 months or earlier     time spent in office and face to face 20 minutes  including chart review         JACKEY FLIGHT, M.D.  12/11/2023 4:14 PM

## 2024-01-02 DIAGNOSIS — F3130 Bipolar disorder, current episode depressed, mild or moderate severity, unspecified: Secondary | ICD-10-CM | POA: Diagnosis not present

## 2024-01-03 LAB — LITHIUM LEVEL: Lithium Lvl: 0.6 mmol/L (ref 0.5–1.2)

## 2024-01-08 ENCOUNTER — Ambulatory Visit (INDEPENDENT_AMBULATORY_CARE_PROVIDER_SITE_OTHER): Payer: Medicare Other | Admitting: Family

## 2024-01-08 ENCOUNTER — Ambulatory Visit: Payer: Medicare Other | Admitting: Family

## 2024-01-08 VITALS — BP 113/65 | HR 72 | Temp 98.5°F | Resp 16 | Ht 64.0 in | Wt 273.0 lb

## 2024-01-08 DIAGNOSIS — I10 Essential (primary) hypertension: Secondary | ICD-10-CM

## 2024-01-08 DIAGNOSIS — L811 Chloasma: Secondary | ICD-10-CM | POA: Diagnosis not present

## 2024-01-08 DIAGNOSIS — E559 Vitamin D deficiency, unspecified: Secondary | ICD-10-CM | POA: Diagnosis not present

## 2024-01-08 DIAGNOSIS — E538 Deficiency of other specified B group vitamins: Secondary | ICD-10-CM | POA: Diagnosis not present

## 2024-01-08 DIAGNOSIS — F332 Major depressive disorder, recurrent severe without psychotic features: Secondary | ICD-10-CM

## 2024-01-08 DIAGNOSIS — R739 Hyperglycemia, unspecified: Secondary | ICD-10-CM | POA: Diagnosis not present

## 2024-01-08 NOTE — Assessment & Plan Note (Signed)
 BP Readings from Last 3 Encounters:  01/08/24 113/65  07/04/23 (!) 125/55  04/04/23 116/66   At goal on lisinopril 10mg . Continue same.

## 2024-01-08 NOTE — Patient Instructions (Signed)
 VISIT SUMMARY:  Today, we discussed your concerns about numbness in your left foot and pain in your upper left thigh, as well as your ongoing management of depression, hypertension, and melasma. We also reviewed your general health maintenance, including your vitamin D and B12 supplementation and diabetes management.  YOUR PLAN:  -LOWER BACK PAIN WITH RADICULOPATHY: Your numbness in the left foot and intermittent shooting pain in the left thigh are likely due to nerve impingement from your lower back. We will continue to monitor your symptoms, and if they worsen or start to affect your quality of life, we may consider imaging, physical therapy, or a course of prednisone.  -DEPRESSION: You mentioned feeling 'the usual depressed,' which is being managed by your psychiatrist with medications including Lithium, Paxil, and Levitra. We will continue with your current psychiatric management.  -HYPERTENSION: Your blood pressure is well-controlled with Lisinopril 10mg  daily. Your blood pressure today was 113/65. We will continue with your current medication and check your kidney function today due to your history of hypertension.  -MELASMA: You have noticed some recurrence of melasma, likely due to inconsistent application of your topical treatment. Please continue to apply the topical treatment consistently to manage this condition.  -GENERAL HEALTH MAINTENANCE: Continue taking Vitamin D 2000 units daily and Vitamin B12 supplements. Your levels were normal in August, so no need to recheck them today. We will check your A1C today to monitor your diabetes management, with your last A1C being 5.9.  INSTRUCTIONS:  Please follow up in 6 months for a routine check-up. We will also check your kidney function and A1C levels today.

## 2024-01-08 NOTE — Assessment & Plan Note (Signed)
 This is being treated topically by dermatology.

## 2024-01-08 NOTE — Assessment & Plan Note (Signed)
 Continues vit D 2000 international units daily.

## 2024-01-08 NOTE — Assessment & Plan Note (Signed)
 Fair control on lithium, paxil and wellbutrin- management per psychiatry.

## 2024-01-08 NOTE — Progress Notes (Signed)
 Subjective:     Patient ID: Julie Powell, female    DOB: 25-Jun-1961, 63 y.o.   MRN: [id number]  Chief Complaint  Patient presents with   Hyperglycemia    Here for follow up   Leg Pain    Complains of some pain on left thigh    Weight Management Screening    Will like to discuss medication for weight management    Hyperglycemia  Leg Pain     Discussed the use of AI scribe software for clinical note transcription with the patient, who gave verbal consent to proceed.  History of Present Illness Julie Powell is a 63 year old female who presents with concerns of left foot numbness and upper left thigh pain.  She experiences numbness in her left foot, primarily when standing, and occasionally when sitting. Additionally, she describes a shooting pain in her upper left thigh that comes and goes but does not cause major discomfort. She has a history of disc issues in her lower back, which may be related to her current symptoms.  From a mental health perspective, she feels 'the usual depressed' but not terrible. She follows up with psychiatry and is on medications including lithium, Paxil, and Levitra.  Her blood pressure is well-controlled with lisinopril 10 mg daily. Her kidney function was last checked in August. Her diabetes management is stable with a last A1c of 5.9%.  She has been using a topical medication for melasma for over a year, which initially improved her condition. However, she noticed some recurrence, possibly due to inconsistent application, and is now back on a regular routine.  She is currently taking vitamin D 2000 IU daily and B12 supplements, both of which were last tested in August and were within normal limits.   Lab Results  Component Value Date   HGBA1C 5.9 07/04/2023      Health Maintenance Due  Topic Date Due   DEXA SCAN  04/04/2019   COVID-19 Vaccine (5 - 2024-25 season) 07/08/2023    Past Medical History:  Diagnosis Date   Anxiety     Asthma    B12 deficiency    Bipolar 1 disorder (HCC) 2010   ect   Fibroids    Hyperglycemia    Hypertension    Plantar fasciitis 2013   Sciatica    RT hip   Vitamin D deficiency     Past Surgical History:  Procedure Laterality Date   CESAREAN SECTION  1995   CYSTECTOMY  1998   "thyroglycol duct cyst in neck" had dysphagia, was told benign   GASTRIC BYPASS  2003   MICRODISCECTOMY LUMBAR  09/11/14   L3-L4  Dr Wynetta Emery   PILONIDAL CYST DRAINAGE  1982   REDUCTION MAMMAPLASTY     SP CHOLECYSTOMY  1986    Family History  Adopted: Yes  Problem Relation Age of Onset   Aneurysm Father    Alcohol abuse Father    Lung cancer Sister    Alcohol abuse Brother    Alcohol abuse Brother    Cancer Brother        prostate   Alcohol abuse Brother    Thyroid disease Neg Hx    Diabetes Neg Hx    Hypertension Neg Hx     Social History   Socioeconomic History   Marital status: Divorced    Spouse name: Not on file   Number of children: 1   Years of education: Not on file   Highest education level: Bachelor's degree (  e.g., BA, AB, BS)  Occupational History   Not on file  Tobacco Use   Smoking status: Former    Current packs/day: 0.00    Average packs/day: 0.5 packs/day for 10.0 years (5.0 ttl pk-yrs)    Types: Cigarettes    Start date: 11/06/1981    Quit date: 11/07/1991    Years since quitting: 32.1   Smokeless tobacco: Never  Vaping Use   Vaping status: Never Used  Substance and Sexual Activity   Alcohol use: Not Currently   Drug use: Not Currently    Types: "Crack" cocaine    Comment: Caffeine- Carbonated beverage 16 ounces.   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   Enjoys television   Right handed    Apartment 1st floor    Social Drivers of Health   Financial Resource Strain: Low Risk  (03/01/2021)   Overall Financial Resource Strain (CARDIA)    Difficulty of Paying Living Expenses: Not very hard  Food Insecurity: No Food Insecurity (04/24/2023)   Hunger Vital Sign    Worried About Running Out of Food in the Last Year: Never true    Ran Out of Food in the Last Year: Never true  Transportation Needs: No Transportation Needs (04/24/2023)   PRAPARE - Administrator, Civil Service (Medical): No    Lack of Transportation (Non-Medical): No  Physical Activity: Inactive (04/24/2023)   Exercise Vital Sign    Days of Exercise per Week: 0 days    Minutes of Exercise per Session: 0 min  Stress: No Stress Concern Present (04/18/2021)   [employer] of Occupational Health - Occupational Stress Questionnaire    Feeling of Stress : Not at all  Social Connections: Unknown (03/19/2022)   [facility]   Social Network    Social Network: Not on file  Recent Concern: Social Connections - Socially Isolated (01/12/2022)   Social Connection and Isolation Panel [NHANES]    Frequency of Communication with Friends and Family: Three times a week    Frequency of Social Gatherings with Friends and Family: Once a week    Attends Religious Services: Never    Database administrator or Organizations: No    Attends Banker Meetings: Never    Marital Status: Divorced  Catering manager Violence: Not At Risk (04/24/2023)   Humiliation, Afraid, Rape, and Kick questionnaire    Fear of Current or Ex-Partner: No    Emotionally Abused: No    Physically Abused: No    Sexually Abused: No    Outpatient Medications Prior to Visit  Medication Sig Dispense Refill   albuterol (PROAIR HFA) 108 (90 Base) MCG/ACT inhaler Inhale 2 puffs into the lungs every 6 (six) hours as needed for  wheezing or shortness of breath. 20.1 g 1   buPROPion (WELLBUTRIN) 100 MG tablet Take 2 tablets (200 mg total) by mouth 2 (two) times daily. TAKE 2 BID 360 tablet 0   Cholecalciferol (VITAMIN D3) 50 MCG (2000 UT) capsule 1-2 tabs by mouth once daily     cyanocobalamin (VITAMIN B12) 1000 MCG tablet Take  1 tablet (1,000 mcg total) by mouth daily.     hyoscyamine (LEVBID) 0.375 MG 12 hr tablet Take 1 tablet by mouth daily.     lisinopril (ZESTRIL) 10 MG tablet TAKE 1 TABLET(10 MG) BY MOUTH DAILY 90 tablet 1   lithium carbonate (LITHOBID) 300 MG ER tablet BID 180 tablet 0   Multiple Vitamin (MULTIVITAMIN) tablet Take 1 tablet by mouth 3 (three) times a week.     PARoxetine (PAXIL) 30 MG tablet ONE A DAY 90 tablet 0   meloxicam (MOBIC) 7.5 MG tablet Take 1 tablet (7.5 mg total) by mouth daily. 14 tablet 0   No facility-administered medications prior to visit.    No Known Allergies  ROS See HPI    Objective:    Physical Exam Constitutional:      General: She is not in acute distress.    Appearance: Normal appearance. She is well-developed.  HENT:     Head: Normocephalic and atraumatic.     Right Ear: External ear normal.     Left Ear: External ear normal.  Eyes:     General: No scleral icterus. Neck:     Thyroid: No thyromegaly.  Cardiovascular:     Rate and Rhythm: Normal rate and regular rhythm.     Heart sounds: Normal heart sounds. No murmur heard. Pulmonary:     Effort: Pulmonary effort is normal. No respiratory distress.     Breath sounds: Normal breath sounds. No wheezing.  Musculoskeletal:     Cervical back: Neck supple.  Skin:    General: Skin is warm and dry.  Neurological:     Mental Status: She is alert and oriented to person, place, and time.  Psychiatric:        Mood and Affect: Mood normal.        Behavior: Behavior normal.        Thought Content: Thought content normal.        Judgment: Judgment normal.      BP 113/65 (BP Location: Right Arm, Patient  Position: Sitting, Cuff Size: Normal)   Pulse 72   Temp 98.5 F (36.9 C) (Oral)   Resp 16   Ht 5\' 4"  (1.626 m)   Wt 273 lb (123.8 kg)   LMP 09/11/2013   SpO2 100%   BMI 46.86 kg/m  Wt Readings from Last 3 Encounters:  01/08/24 273 lb (123.8 kg)  07/04/23 268 lb (121.6 kg)  04/04/23 269 lb (122 kg)       Assessment & Plan:   Problem List Items Addressed This Visit       Unprioritized   Vitamin D deficiency - Primary   Continues vit D 2000 international units daily.      Vitamin B 12 deficiency   Stable on b12 daily.      Melasma   This is being treated topically by dermatology.       Major depressive disorder, recurrent, severe w/o psychotic behavior (HCC)   Fair control on lithium, paxil and wellbutrin- management per psychiatry.       Hypertension   BP Readings from Last 3 Encounters:  01/08/24 113/65  07/04/23 (!) 125/55  04/04/23 116/66   At goal on lisinopril 10mg . Continue same.      Hyperglycemia   Relevant Orders   Basic Metabolic Panel (BMET)   HgB A1c    I have discontinued Reizel Turi's meloxicam. I am also having her maintain her multivitamin, cyanocobalamin, hyoscyamine, lisinopril, Vitamin D3, albuterol, buPROPion, lithium  carbonate, and PARoxetine.  No orders of the defined types were placed in this encounter.

## 2024-01-08 NOTE — Assessment & Plan Note (Signed)
 Stable on b12 daily.

## 2024-01-09 LAB — BASIC METABOLIC PANEL
BUN: 21 mg/dL (ref 6–23)
CO2: 26 meq/L (ref 19–32)
Calcium: 9.6 mg/dL (ref 8.4–10.5)
Chloride: 105 meq/L (ref 96–112)
Creatinine, Ser: 1.02 mg/dL (ref 0.40–1.20)
GFR: 59.85 mL/min — ABNORMAL LOW (ref >60.00)
Glucose, Bld: 94 mg/dL (ref 70–99)
Potassium: 4.1 meq/L (ref 3.5–5.1)
Sodium: 136 meq/L (ref 135–145)

## 2024-01-09 LAB — HEMOGLOBIN A1C: Hgb A1c MFr Bld: 6 % (ref 4.6–6.5)

## 2024-01-10 ENCOUNTER — Encounter: Payer: Self-pay | Admitting: Family

## 2024-01-30 ENCOUNTER — Other Ambulatory Visit: Payer: Self-pay | Admitting: Family

## 2024-01-30 DIAGNOSIS — I10 Essential (primary) hypertension: Secondary | ICD-10-CM

## 2024-02-13 DIAGNOSIS — H2513 Age-related nuclear cataract, bilateral: Secondary | ICD-10-CM | POA: Diagnosis not present

## 2024-02-13 DIAGNOSIS — H524 Presbyopia: Secondary | ICD-10-CM | POA: Diagnosis not present

## 2024-02-25 ENCOUNTER — Other Ambulatory Visit: Payer: Self-pay | Admitting: Family

## 2024-02-25 DIAGNOSIS — Z1231 Encounter for screening mammogram for malignant neoplasm of breast: Secondary | ICD-10-CM

## 2024-03-03 ENCOUNTER — Telehealth (HOSPITAL_COMMUNITY): Payer: Self-pay

## 2024-03-03 MED ORDER — PAROXETINE HCL 30 MG PO TABS: ORAL_TABLET | ORAL | 0 refills | Status: DC

## 2024-03-03 NOTE — Telephone Encounter (Signed)
 Medication refill - Fax from pt's [facility] requesting a new Paroxetine  30 mg order, last provided for 90 day supply on 12/07/23 and patient last seen 12/11/23 with next follow up set for 03/13/24.  Patient's [facility] on [address] in [place] requesting the new 90 day order.

## 2024-03-06 ENCOUNTER — Ambulatory Visit

## 2024-03-11 ENCOUNTER — Ambulatory Visit (HOSPITAL_COMMUNITY): Payer: Medicare Other | Admitting: Psychiatry

## 2024-03-13 ENCOUNTER — Ambulatory Visit (HOSPITAL_BASED_OUTPATIENT_CLINIC_OR_DEPARTMENT_OTHER)
Admission: RE | Admit: 2024-03-13 | Discharge: 2024-03-13 | Disposition: A | Discharge status: Home / Self Care | Source: Ambulatory Visit | Attending: Family | Admitting: Family

## 2024-03-13 ENCOUNTER — Ambulatory Visit (HOSPITAL_COMMUNITY): Admitting: Psychiatry

## 2024-03-13 DIAGNOSIS — Z1231 Encounter for screening mammogram for malignant neoplasm of breast: Secondary | ICD-10-CM | POA: Diagnosis not present

## 2024-03-18 ENCOUNTER — Ambulatory Visit (HOSPITAL_COMMUNITY): Admitting: Psychiatry

## 2024-04-08 ENCOUNTER — Encounter (HOSPITAL_COMMUNITY): Payer: Self-pay | Admitting: Psychiatry

## 2024-04-08 ENCOUNTER — Ambulatory Visit (INDEPENDENT_AMBULATORY_CARE_PROVIDER_SITE_OTHER): Admitting: Psychiatry

## 2024-04-08 VITALS — BP 115/77 | HR 73 | Ht 64.0 in | Wt 278.0 lb

## 2024-04-08 DIAGNOSIS — F3130 Bipolar disorder, current episode depressed, mild or moderate severity, unspecified: Secondary | ICD-10-CM | POA: Diagnosis not present

## 2024-04-08 DIAGNOSIS — F4010 Social phobia, unspecified: Secondary | ICD-10-CM | POA: Diagnosis not present

## 2024-04-08 DIAGNOSIS — F1020 Alcohol dependence, uncomplicated: Secondary | ICD-10-CM | POA: Diagnosis not present

## 2024-04-08 MED ORDER — PAROXETINE HCL 30 MG PO TABS: ORAL_TABLET | ORAL | 0 refills | Status: DC

## 2024-04-08 MED ORDER — LITHIUM CARBONATE ER 300 MG PO TBCR: EXTENDED_RELEASE_TABLET | ORAL | 0 refills | Status: DC

## 2024-04-08 MED ORDER — BUPROPION HCL 100 MG PO TABS: 200.0000 mg | ORAL_TABLET | Freq: Two times a day (BID) | ORAL | 0 refills | Status: DC

## 2024-04-08 NOTE — Progress Notes (Signed)
 Patient ID: Julie Powell, female   DOB: February 12, 1961, 63 y.o.   MRN: [id number] [facility] Follow-up Outpatient Visit  Julie Powell 08-29-1961         CC  depression follow up  HPI Comments: Julie Powell is a 63   Y/O woman with a past psychiatric history Major Depressive Disorder, severe, recurrent. Mood disorder due to [facility], Alcohol use disorder and Social Phobia.   Remains sober, compliant ,depression is stable  Trying to work on weight maintenance   Lithium  level 0.6 hBA1C 6 and is being evaluated by PCP    Aggravating factor: lonliness modifying factor:housing, grand baby   Duration: since young age  Timing: variable   Context: alcohol use history\ Severity; manageable   Associated signs and symptoms: Denies psychosis   Traumatic Brain Injury: No none   Review of Systems  Cardiovascular:  Negative for chest pain and palpitations.  Skin:  Negative for rash.  Neurological:  Negative for headaches.  Psychiatric/Behavioral:  Negative for depression and suicidal ideas.    Vitals:   04/08/24 1404  BP: 115/77  Pulse: 73  Weight: 278 lb (126.1 kg)  Height: 5\' 4"  (1.626 m)     Physical Exam  Constitutional: She appears well-developed and well-nourished. No distress.  Skin: She is not diaphoretic.  No tremors of hands noted while patient was interviewed.     Past Medical History: Reviewed  Past Medical History:  Diagnosis Date   Anxiety    Asthma    B12 deficiency    Bipolar 1 disorder (HCC) 2010   ect   Fibroids    Hyperglycemia    Hypertension    Plantar fasciitis 2013   Sciatica    RT hip   Vitamin D  deficiency     Allergies: No Known Allergies   Current Medications: Reviewed   Current Outpatient Medications on File Prior to Visit  Medication Sig Dispense Refill   albuterol  (PROAIR  HFA) 108 (90 Base) MCG/ACT inhaler Inhale 2 puffs into the lungs every 6 (six) hours as needed for wheezing or shortness of breath. 20.1 g 1    Cholecalciferol (VITAMIN D3) 50 MCG (2000 UT) capsule 1-2 tabs by mouth once daily     cyanocobalamin  (VITAMIN B12) 1000 MCG tablet Take 1 tablet (1,000 mcg total) by mouth daily.     hyoscyamine (LEVBID) 0.375 MG 12 hr tablet Take 1 tablet by mouth daily.     lisinopril  (ZESTRIL ) 10 MG tablet Take 1 tablet (10 mg total) by mouth daily. 90 tablet 1   Multiple Vitamin (MULTIVITAMIN) tablet Take 1 tablet by mouth 3 (three) times a week.     No current facility-administered medications on file prior to visit.      Family History: Reviewed  Family History   Problem  Relation  Age of Onset     Adopted: Yes     Aneurysm  Father     Psychiatric Specialty Exam:  Objective: Appearance: casual  Eye Contact:: casual  Speech: Clear and Coherent and Normal Rate   Volume: Normal   Mood fair   Affect:congruent  Thought Process: Goal Directed, Logical and Loose   Orientation: Full   Thought Content: WDL   Suicidal Thoughts: Patient denies   Homicidal Thoughts: Patient denies   Judgement: Fair   Insight: Fair   Psychomotor Activity: Normal   Akathisia: No   Handed: Right   Memory: Immediate 3/3, Recent: 1/3   Language-intact  Fund of knowledge-average to above average.  AIMS (if  indicated): Not indicated at this time.   Assets: Communication Skills  Desire for Improvement      Plan of Care:        Aaron Aas   Medications and Assessment :  Prior documentation reviewed  Bipolar depression:depressed;   manageable, continue lithium , wellbutrin , paxil  Labs discussed    Anxiety: in crowds gets anxious, continue paxil   alcohol use: sober , relapse prevention discussed    Fu 3 months or earlier     time spent in office and face to face 20 minutes   including chart review         Wray Heady, M.D.  04/08/2024 2:07 PM

## 2024-04-24 ENCOUNTER — Telehealth: Payer: Self-pay | Admitting: *Deleted

## 2024-04-24 ENCOUNTER — Ambulatory Visit: Payer: Medicare Other | Admitting: *Deleted

## 2024-04-24 VITALS — Ht 64.0 in | Wt 280.0 lb

## 2024-04-24 DIAGNOSIS — Z0000 Encounter for general adult medical examination without abnormal findings: Secondary | ICD-10-CM

## 2024-04-24 DIAGNOSIS — Z780 Asymptomatic menopausal state: Secondary | ICD-10-CM | POA: Diagnosis not present

## 2024-04-24 NOTE — Patient Instructions (Addendum)
 [name] , Thank you for taking time out of your busy schedule to complete your Annual Wellness Visit with me. I enjoyed our conversation and look forward to speaking with you again next year. I, as well as your care team,  appreciate your ongoing commitment to your health goals. Please review the following plan we discussed and let me know if I can assist you in the future. Your Game plan/ To Do List   Referrals: If you haven't heard from the office you've been referred to, please reach out to them at the phone provided.   Bone Density: Call [phone] to check status if not contacted in 1 month.  Follow up Visits: Next Medicare AWV with our clinical staff:  04/28/24 3pm  Next Office Visit with your provider: 04/25/24 2pm, 07/09/24 1:20pm  Clinician Recommendations:  Aim for 30 minutes of exercise or brisk walking, 6-8 glasses of water, and 5 servings of fruits and vegetables each day.       This is a list of the screening recommended for you and due dates:  Health Maintenance  Topic Date Due   DEXA scan (bone density measurement)  04/04/2019   COVID-19 Vaccine (5 - 2024-25 season) 07/08/2023   Medicare Annual Wellness Visit  04/23/2024   Flu Shot  06/06/2024   DTaP/Tdap/Td vaccine (2 - Td or Tdap) 11/13/2024   Mammogram  03/13/2026   Pap with HPV screening  04/03/2028   Colon Cancer Screening  01/12/2032   Pneumococcal Vaccination  Completed   Hepatitis C Screening  Completed   HIV Screening  Completed   Zoster (Shingles) Vaccine  Completed   HPV Vaccine  Aged Out   Meningitis B Vaccine  Aged Out    Advanced directives: (ACP Link)Information on Advanced Care Planning can be found at [place]  Secretary of State Advance Health Care Directives Advance Health Care Directives. [email or web address]  Advance Care Planning is important because it:  [x]  Makes sure you receive the medical care that is consistent with your values, goals, and preferences  [x]  It provides guidance to your  family and loved ones and reduces their decisional burden about whether or not they are making the right decisions based on your wishes.  Follow the link provided in your after visit summary or read over the paperwork we have mailed to you to help you started getting your Advance Directives in place. If you need assistance in completing these, please reach out to us  so that we can help you!  See attachments for Exercise Tips.

## 2024-04-24 NOTE — Progress Notes (Signed)
 Please attest this visit in the absence of patient primary care provider.    Subjective:   Julie Powell is a 63 y.o. who presents for a Medicare Wellness preventive visit.  As a reminder, Annual Wellness Visits don't include a physical exam, and some assessments may be limited, especially if this visit is performed virtually. We may recommend an in-person follow-up visit with your provider if needed.  Visit Complete: Virtual I connected with  [name] on 04/24/24 by a audio enabled telemedicine application and verified that I am speaking with the correct person using two identifiers.  Patient Location: Home  Provider Location: Office/Clinic  I discussed the limitations of evaluation and management by telemedicine. The patient expressed understanding and agreed to proceed.  Vital Signs: Because this visit was a virtual/telehealth visit, some criteria may be missing or patient reported. Any vitals not documented were not able to be obtained and vitals that have been documented are patient reported.  VideoDeclined- This patient declined Librarian, academic. Therefore the visit was completed with audio only.  Persons Participating in Visit: Patient.  AWV Questionnaire: No: Patient Medicare AWV questionnaire was not completed prior to this visit.  Cardiac Risk Factors include: dyslipidemia;hypertension;obesity (BMI >30kg/m2)     Objective:    Today's Vitals   04/24/24 1510  Weight: 280 lb (127 kg)  Height: 5' 4 (1.626 m)   Body mass index is 48.06 kg/m.     04/24/2024    3:47 PM 04/08/2024    2:06 PM 12/11/2023    4:04 PM 04/24/2023    2:58 PM 04/20/2022    1:44 PM 04/18/2021    1:44 PM 11/17/2020    5:12 PM  Advanced Directives  Does Patient Have a Medical Advance Directive? No   No No No No  Would patient like information on creating a medical advance directive? No - Patient declined   No - Patient declined No - Patient declined No - Patient  declined      Information is confidential and restricted. Go to Review Flowsheets to unlock data.    Current Medications (verified) Outpatient Encounter Medications as of 04/24/2024  Medication Sig   albuterol  (PROAIR  HFA) 108 (90 Base) MCG/ACT inhaler Inhale 2 puffs into the lungs every 6 (six) hours as needed for wheezing or shortness of breath.   buPROPion  (WELLBUTRIN ) 100 MG tablet Take 2 tablets (200 mg total) by mouth 2 (two) times daily. TAKE 2 BID   Cholecalciferol (VITAMIN D3) 50 MCG (2000 UT) capsule 1-2 tabs by mouth once daily   cyanocobalamin  (VITAMIN B12) 1000 MCG tablet Take 1 tablet (1,000 mcg total) by mouth daily.   hyoscyamine (LEVBID) 0.375 MG 12 hr tablet Take 1 tablet by mouth daily.   lisinopril  (ZESTRIL ) 10 MG tablet Take 1 tablet (10 mg total) by mouth daily.   lithium  carbonate (LITHOBID ) 300 MG ER tablet BID   Multiple Vitamin (MULTIVITAMIN) tablet Take 1 tablet by mouth 3 (three) times a week.   PARoxetine  (PAXIL ) 30 MG tablet ONE A DAY   No facility-administered encounter medications on file as of 04/24/2024.    Allergies (verified) Patient has no known allergies.   History: Past Medical History:  Diagnosis Date   Anxiety    Asthma    B12 deficiency    Bipolar 1 disorder (HCC) 2010   ect   Fibroids    Hyperglycemia    Hypertension    Plantar fasciitis 2013   Sciatica    RT hip  Vitamin D  deficiency    Past Surgical History:  Procedure Laterality Date   CESAREAN SECTION  1995   CYSTECTOMY  1998   thyroglycol duct cyst in neck had dysphagia, was told benign   GASTRIC BYPASS  2003   MICRODISCECTOMY LUMBAR  09/11/2014   L3-L4  Dr Lamon Pillow   PILONIDAL CYST DRAINAGE  1982   2024   REDUCTION MAMMAPLASTY     SP CHOLECYSTOMY  1986   Family History  Adopted: Yes  Problem Relation Age of Onset   Aneurysm Father    Alcohol abuse Father    Lung cancer Sister    Alcohol abuse Brother    Alcohol abuse Brother    Cancer Brother        prostate    Alcohol abuse Brother    Thyroid  disease Neg Hx    Diabetes Neg Hx    Hypertension Neg Hx    Social History   Socioeconomic History   Marital status: Divorced    Spouse name: Not on file   Number of children: 1   Years of education: Not on file   Highest education level: Bachelor's degree (e.g., BA, AB, BS)  Occupational History   Not on file  Tobacco Use   Smoking status: Former    Current packs/day: 0.00    Average packs/day: 0.5 packs/day for 10.0 years (5.0 ttl pk-yrs)    Types: Cigarettes    Start date: 11/06/1981    Quit date: 11/07/1991    Years since quitting: 32.4   Smokeless tobacco: Never  Vaping Use   Vaping status: Never Used  Substance and Sexual Activity   Alcohol use: Not Currently   Drug use: Not Currently    Types: Crack cocaine    Comment: Caffeine- Carbonated beverage 16 ounces.   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   Enjoys television   Right handed    Apartment 1st floor    Social Drivers of Health   Financial Resource Strain: Low Risk  (04/24/2024)   Overall Financial Resource Strain (CARDIA)    Difficulty of Paying Living Expenses: Not hard at all  Food Insecurity: No Food Insecurity (04/24/2024)   Hunger Vital Sign    Worried About Running Out of Food in the Last Year: Never true    Ran Out of Food in the Last Year: Never true  Transportation Needs: No Transportation Needs (04/24/2024)   PRAPARE - Administrator, Civil Service (Medical): No    Lack of Transportation (Non-Medical): No  Physical Activity: Inactive (04/24/2024)   Exercise Vital Sign    Days of Exercise per Week:  0 days    Minutes of Exercise per Session: 0 min  Stress: No Stress Concern Present (04/24/2024)   [employer] of Occupational Health - Occupational Stress Questionnaire    Feeling of Stress: Not at all  Social Connections: Unknown (04/24/2024)   Social Connection and Isolation Panel    Frequency of Communication with Friends and Family: Twice a week    Frequency of Social Gatherings with Friends and Family: Not on file    Attends Religious  Services: Never    Database administrator or Organizations: No    Attends Engineer, structural: Never    Marital Status: Divorced    Tobacco Counseling Counseling given: Not Answered    Clinical Intake:  Pre-visit preparation completed: Yes  Pain : No/denies pain     BMI - recorded: 48.06 Nutritional Status: BMI > 30  Obese Nutritional Risks: None Diabetes: No  Lab Results  Component Value Date   HGBA1C 6.0 01/08/2024   HGBA1C 5.9 07/04/2023   HGBA1C 5.9 01/02/2023     How often do you need to have someone help you when you read instructions, pamphlets, or other written materials from your doctor or pharmacy?: 1 - Never What is the last grade level you completed in school?: bachelor's degree  Interpreter Needed?: No  Information entered by :: Susa Engman, CMA   Activities of Daily Living     04/24/2024    3:17 PM  In your present state of health, do you have any difficulty performing the following activities:  Hearing? 0  Vision? 0  Difficulty concentrating or making decisions? 0  Walking or climbing stairs? 1  Dressing or bathing? 0  Doing errands, shopping? 0  Preparing Food and eating ? N  Using the Toilet? N  In the past six months, have you accidently leaked urine? N  Do you have problems with loss of bowel control? N  Managing your Medications? N  Managing your Finances? N  Housekeeping or managing your Housekeeping? N    Patient Care Team: Dorrene Gaucher, NP as PCP - General  (Internal Medicine) Wray Heady, MD as Consulting Physician (Psychiatry) Gwyndolyn Lerner, MD (Inactive) as Consulting Physician (Endocrinology) Exie Holler, MD as Consulting Physician (Gastroenterology) Jhonny Moss, MD as Consulting Physician (Neurology) [facility] In [place], [place]  I have updated your Care Teams any recent Medical Services you may have received from other providers in the past year.     Assessment:   This is a routine wellness examination for Julie Powell.  Hearing/Vision screen Hearing Screening - Comments:: Denies hearing difficulties.  Vision Screening - Comments:: Wears RX glasses -- up to date with routine eye exams.    Goals Addressed               This Visit's Progress     Increase physical activity   Not on track     COMPLETED: Stop drinking so much (pt-stated)         Depression Screen     04/24/2024    3:26 PM 01/08/2024    6:04 PM 04/24/2023    3:02 PM 04/04/2023    2:06 PM 01/02/2023    1:03 PM 04/20/2022    1:45 PM 01/03/2022    2:29 PM  PHQ 2/9 Scores  PHQ - 2 Score 2 4 2 5 1 4 3   PHQ- 9 Score 10 17 10 16 1 15 12     Fall Risk     04/24/2024    3:16 PM 04/24/2023    2:59 PM 01/02/2023    1:04 PM 04/20/2022    1:44 PM 05/04/2021    1:56 PM  Fall Risk   Falls in the past year? 0 0 0 0 1  Number falls in past yr: 0 0 0 0 1  Injury with Fall? 1 0 0 0 1  Risk for fall due to : Impaired mobility;Impaired balance/gait No Fall Risks No Fall Risks History of fall(s)   Risk for  fall due to: Comment back pain causes mobility issues      Follow up Education provided Falls evaluation completed Falls evaluation completed Falls evaluation completed       Data saved with a previous flowsheet row definition    MEDICARE RISK AT HOME:  Medicare Risk at Home Any stairs in or around the home?: No Home free of loose throw rugs in walkways, pet beds, electrical cords, etc?: Yes Adequate lighting in your home to reduce risk of  falls?: Yes Life alert?: No Use of a cane, walker or w/c?: No Grab bars in the bathroom?: No Shower chair or bench in shower?: Yes Elevated toilet seat or a handicapped toilet?: No  TIMED UP AND GO:  Was the test performed?  No, audio   Cognitive Function: 6CIT completed    11/22/2017   11:30 AM 10/17/2016    2:12 PM  MMSE - Mini Mental State Exam  Orientation to time 5  5   Orientation to Place 5  5   Registration 3  3   Attention/ Calculation 5  5   Recall 3  2   Language- name 2 objects 2  2   Language- repeat 1 1  Language- follow 3 step command 3  3   Language- read & follow direction 1  1   Write a sentence 1  1   Copy design 1  1   Total score 30  29      Data saved with a previous flowsheet row definition        04/24/2024    3:32 PM 04/24/2023    3:09 PM 04/20/2022    1:56 PM  6CIT Screen  What Year? 0 points 0 points 0 points  What month? 0 points 0 points 0 points  What time? 0 points 0 points 0 points  Count back from 20 0 points 0 points 0 points  Months in reverse 0 points 0 points 0 points  Repeat phrase 0 points 6 points 0 points  Total Score 0 points 6 points 0 points    Immunizations Immunization History  Administered Date(s) Administered   Influenza, Mdck, Trivalent,PF 6+ MOS(egg free) 07/16/2023   Influenza,inj,Quad PF,6+ Mos 08/12/2014, 09/22/2015, 09/13/2016, 08/07/2017, 10/29/2018, 07/09/2019, 08/31/2021, 08/02/2022   PFIZER(Purple Top)SARS-COV-2 Vaccination 07/06/2020, 07/27/2020, 03/17/2021   PNEUMOCOCCAL CONJUGATE-20 05/04/2021   Pfizer Covid-19 Vaccine Bivalent Booster 37yrs & up 09/08/2021   Pneumococcal Polysaccharide-23 08/29/2011   Tdap 11/13/2014   Zoster Recombinant(Shingrix ) 08/02/2022, 01/09/2023    Screening Tests Health Maintenance  Topic Date Due   DEXA SCAN  04/04/2019   COVID-19 Vaccine (5 - 2024-25 season) 07/08/2023   INFLUENZA VACCINE  06/06/2024   DTaP/Tdap/Td (2 - Td or Tdap) 11/13/2024   Medicare Annual  Wellness (AWV)  04/24/2025   MAMMOGRAM  03/13/2026   Cervical Cancer Screening (HPV/Pap Cotest)  04/03/2028   Colonoscopy  01/12/2032   Pneumococcal Vaccine 26-3 Years old  Completed   Hepatitis C Screening  Completed   HIV Screening  Completed   Zoster Vaccines- Shingrix   Completed   HPV VACCINES  Aged Out   Meningococcal B Vaccine  Aged Out    Health Maintenance  Health Maintenance Due  Topic Date Due   DEXA SCAN  04/04/2019   COVID-19 Vaccine (5 - 2024-25 season) 07/08/2023   Health Maintenance Items Addressed: DEXA ordered, will get future COVID vaccines at local pharmacy.  Additional Screening:  Vision Screening: Recommended annual ophthalmology exams for early detection of glaucoma and  other disorders of the eye. Would you like a referral to an eye doctor? No    Dental Screening: Recommended annual dental exams for proper oral hygiene  Community Resource Referral / Chronic Care Management: CRR required this visit?  No   CCM required this visit?  No   Plan:    I have personally reviewed and noted the following in the patient's chart:   Medical and social history Use of alcohol, tobacco or illicit drugs  Current medications and supplements including opioid prescriptions. Patient is not currently taking opioid prescriptions. Functional ability and status Nutritional status Physical activity Advanced directives List of other physicians Hospitalizations, surgeries, and ER visits in previous 12 months Vitals Screenings to include cognitive, depression, and falls Referrals and appointments  In addition, I have reviewed and discussed with patient certain preventive protocols, quality metrics, and best practice recommendations. A written personalized care plan for preventive services as well as general preventive health recommendations were provided to patient.   Susa Engman, CMA   04/24/2024   After Visit Summary: (MyChart) Due to this being a telephonic  visit, the after visit summary with patients personalized plan was offered to patient via MyChart   Notes: See phone note.

## 2024-04-24 NOTE — Telephone Encounter (Signed)
 Pt had AWV today and mentioned waking up from sleep gasping for air.  States she has OSA and has not had a mask for some time.  Reports low BP readings at home with associated dizziness. When symptomatic, pt does not take BP medication.. Waits until reading is normal before taking medication. Readings last week:  96/49, 99/56, 100/56, 105/61. Pt did not take medication either of those days.  04/19/24 120/65 before medication,  118/66 after medication.  I have scheduled pt OV for tomorrow at 2pm.  She will bring home BP monitor with her.

## 2024-04-25 ENCOUNTER — Ambulatory Visit (INDEPENDENT_AMBULATORY_CARE_PROVIDER_SITE_OTHER): Admitting: Family

## 2024-04-25 VITALS — BP 109/75 | HR 70 | Temp 97.9°F | Resp 16 | Ht 64.0 in | Wt 275.0 lb

## 2024-04-25 DIAGNOSIS — I10 Essential (primary) hypertension: Secondary | ICD-10-CM

## 2024-04-25 DIAGNOSIS — G4733 Obstructive sleep apnea (adult) (pediatric): Secondary | ICD-10-CM | POA: Insufficient documentation

## 2024-04-25 MED ORDER — LISINOPRIL 5 MG PO TABS
5.0000 mg | ORAL_TABLET | Freq: Every day | ORAL | 3 refills | Status: DC
Start: 2024-04-25 — End: 2024-04-25

## 2024-04-25 MED ORDER — LISINOPRIL 5 MG PO TABS: 5.0000 mg | ORAL_TABLET | Freq: Every day | ORAL | 0 refills | Status: DC

## 2024-04-25 NOTE — Assessment & Plan Note (Addendum)
  Reports remote history of obstructive sleep apnea prior to gastric bypass surgery.  Now she is again symptomatic. Previous CPAP intolerance. - Refer to sleep specialist for re-evaluation. - Discuss potential for home sleep study. - Consider alternative CPAP options or nasal devices if needed. - Continue to work on diet/exercise/weight loss. Commended pt on giving up alcohol and soda.

## 2024-04-25 NOTE — Assessment & Plan Note (Signed)
 Overtreated.  Intermittent hypotension with dizziness.  - Reduce lisinopril  from 10mg  to 5 mg. - Monitor blood pressure and symptoms. - Discontinue medication if symptoms persist with low readings.

## 2024-04-25 NOTE — Patient Instructions (Signed)
 VISIT SUMMARY:  Today, we discussed your low blood pressure and dizziness, as well as your sleep apnea symptoms. We also reviewed your positive lifestyle changes and planned for a follow-up visit in September.  YOUR PLAN:  LOW BLOOD PRESSURE (HYPOTENSION): You have been experiencing low blood pressure and dizziness, which may be related to your medication. -Reduce your medication dosage to 5 mg. -Monitor your blood pressure and symptoms regularly. -If you continue to have low readings and symptoms, stop taking the medication. -You will be prescribed 5 mg tablets for 90 days.  OBSTRUCTIVE SLEEP APNEA: You have a history of obstructive sleep apnea and have recently experienced symptoms again. -You will be referred to a sleep specialist for re-evaluation. -We will discuss the potential for a home sleep study. -Consider alternative CPAP options or nasal devices if needed.  GENERAL HEALTH MAINTENANCE: You have made positive lifestyle changes, such as stopping alcohol and diet soda consumption. -Continue with your current lifestyle changes.  FOLLOW-UP: We have scheduled a follow-up visit to review your progress. -Your follow-up visit is scheduled for September. -Contact us  if your blood pressure increases significantly. -We will review your blood pressure readings and symptoms at your follow-up visit.

## 2024-04-25 NOTE — Progress Notes (Signed)
 Subjective:     Patient ID: Julie Powell, female    DOB: October 20, 1961, 63 y.o.   MRN: [id number]  Chief Complaint  Patient presents with   Hypertension    Patient reports low bp readings at home, as low as 96/49   Sleep Apnea    Patient will like to discuss OSA symptoms    Hypertension    Discussed the use of AI scribe software for clinical note transcription with the patient, who gave verbal consent to proceed.  History of Present Illness Julie Powell is a 63 year old female who presents with low blood pressure and dizziness.  She experiences sporadic episodes of low blood pressure at home, with recent readings of 96/49, 99/56, and 100/56, accompanied by dizziness and lightheadedness. She has been skipping her medication on some days to allow her blood pressure to normalize.  She has sleep apnea diagnosed in 2002 and has recently experienced episodes of waking up gasping for air and coughing while sleeping on her back. Sleeping on her side has resolved these symptoms.  She has stopped consuming alcohol and diet soda, now drinking only water, and reports feeling better since making these changes.  BP Readings from Last 3 Encounters:  04/25/24 109/75  01/08/24 113/65  07/04/23 (!) 125/55        Health Maintenance Due  Topic Date Due   DEXA SCAN  04/04/2019   COVID-19 Vaccine (5 - 2024-25 season) 07/08/2023    Past Medical History:  Diagnosis Date   Anxiety    Asthma    B12 deficiency    Bipolar 1 disorder (HCC) 2010   ect   Fibroids    Hyperglycemia    Hypertension    Plantar fasciitis 2013   Sciatica    RT hip   Vitamin D  deficiency     Past Surgical History:  Procedure Laterality Date   CESAREAN SECTION  1995   CYSTECTOMY  1998   thyroglycol duct cyst in neck had dysphagia, was told benign   GASTRIC BYPASS  2003   MICRODISCECTOMY LUMBAR  09/11/2014   L3-L4  Dr Lamon Pillow   PILONIDAL CYST DRAINAGE  1982   2024   REDUCTION MAMMAPLASTY     SP  CHOLECYSTOMY  1986    Family History  Adopted: Yes  Problem Relation Age of Onset   Aneurysm Father    Alcohol abuse Father    Lung cancer Sister    Alcohol abuse Brother    Alcohol abuse Brother    Cancer Brother        prostate   Alcohol abuse Brother    Thyroid  disease Neg Hx    Diabetes Neg Hx    Hypertension Neg Hx     Social History   Socioeconomic History   Marital status: Divorced    Spouse name: Not on file   Number of children: 1   Years of education: Not on file   Highest education level: Bachelor's degree (e.g., BA, AB, BS)  Occupational History   Not on file  Tobacco Use   Smoking status: Former    Current packs/day: 0.00    Average packs/day: 0.5 packs/day for 10.0 years (5.0 ttl pk-yrs)    Types: Cigarettes    Start date: 11/06/1981    Quit date: 11/07/1991    Years since quitting: 32.4   Smokeless tobacco: Never  Vaping Use   Vaping status: Never Used  Substance and Sexual Activity   Alcohol use: Not Currently   Drug  use: Not Currently    Types: Crack cocaine    Comment: Caffeine- Carbonated beverage 16 ounces.   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   Enjoys television   Right handed    Apartment 1st floor    Social Drivers of Health   Financial Resource Strain: Low Risk  (04/24/2024)   Overall Financial Resource Strain (CARDIA)    Difficulty of Paying Living Expenses: Not hard at all  Food Insecurity: No Food Insecurity (04/24/2024)   Hunger Vital Sign    Worried About Running Out of Food in the Last Year: Never true    Ran Out of Food in the Last Year: Never true  Transportation Needs: No  Transportation Needs (04/24/2024)   PRAPARE - Administrator, Civil Service (Medical): No    Lack of Transportation (Non-Medical): No  Physical Activity: Inactive (04/24/2024)   Exercise Vital Sign    Days of Exercise per Week: 0 days    Minutes of Exercise per Session: 0 min  Stress: No Stress Concern Present (04/24/2024)   [employer] of Occupational Health - Occupational Stress Questionnaire    Feeling of Stress: Not at all  Social Connections: Unknown (04/24/2024)   Social Connection and Isolation Panel    Frequency of Communication with Friends and Family: Twice a week    Frequency of Social Gatherings with Friends and Family: Not on file    Attends Religious Services: Never    Database administrator or Organizations: No    Attends Banker Meetings: Never    Marital Status: Divorced  Catering manager Violence: Not At Risk (04/24/2024)   Humiliation, Afraid, Rape, and Kick questionnaire    Fear of Current or Ex-Partner: No    Emotionally Abused: No    Physically Abused: No    Sexually Abused: No    Outpatient Medications Prior to Visit  Medication Sig Dispense Refill   albuterol  (PROAIR  HFA) 108 (90 Base) MCG/ACT inhaler Inhale 2 puffs into the lungs every 6 (six) hours as needed for wheezing or shortness of breath. 20.1 g 1   buPROPion  (WELLBUTRIN ) 100 MG tablet Take 2 tablets (200 mg total) by mouth 2 (two) times daily. TAKE 2 BID 360 tablet 0   Cholecalciferol (VITAMIN D3) 50 MCG (2000 UT) capsule 1-2 tabs by mouth once daily     cyanocobalamin  (VITAMIN B12) 1000 MCG tablet Take 1 tablet (1,000 mcg total) by mouth daily.     hyoscyamine (LEVBID) 0.375 MG 12 hr tablet Take 1 tablet by mouth daily.     lithium  carbonate (LITHOBID ) 300 MG ER tablet BID 180 tablet 0   Multiple Vitamin (MULTIVITAMIN) tablet Take 1 tablet by mouth 3 (three) times a week.     PARoxetine  (PAXIL ) 30 MG tablet ONE A DAY 90 tablet 0   lisinopril  (ZESTRIL ) 10 MG tablet Take 1  tablet (10 mg total) by mouth daily. 90 tablet 1   No facility-administered medications prior to visit.    No Known Allergies  ROS    See HPI Objective:    Physical Exam Constitutional:  General: She is not in acute distress.    Appearance: Normal appearance. She is well-developed.  HENT:     Head: Normocephalic and atraumatic.     Right Ear: External ear normal.     Left Ear: External ear normal.   Eyes:     General: No scleral icterus.  Neck:     Thyroid : No thyromegaly.   Cardiovascular:     Rate and Rhythm: Normal rate and regular rhythm.     Heart sounds: Normal heart sounds. No murmur heard. Pulmonary:     Effort: Pulmonary effort is normal. No respiratory distress.     Breath sounds: Normal breath sounds. No wheezing.   Musculoskeletal:     Cervical back: Neck supple.   Skin:    General: Skin is warm and dry.   Neurological:     Mental Status: She is alert and oriented to person, place, and time.   Psychiatric:        Mood and Affect: Mood normal.        Behavior: Behavior normal.        Thought Content: Thought content normal.        Judgment: Judgment normal.      BP 109/75 (BP Location: Right Arm, Patient Position: Sitting, Cuff Size: Normal)   Pulse 70   Temp 97.9 F (36.6 C) (Oral)   Resp 16   Ht 5' 4 (1.626 m)   Wt 275 lb (124.7 kg)   LMP 09/11/2013   SpO2 100%   BMI 47.20 kg/m  Wt Readings from Last 3 Encounters:  04/25/24 275 lb (124.7 kg)  04/24/24 280 lb (127 kg)  01/08/24 273 lb (123.8 kg)       Assessment & Plan:   Problem List Items Addressed This Visit       Unprioritized   OSA (obstructive sleep apnea)    Reports remote history of obstructive sleep apnea prior to gastric bypass surgery.  Now she is again symptomatic. Previous CPAP intolerance. - Refer to sleep specialist for re-evaluation. - Discuss potential for home sleep study. - Consider alternative CPAP options or nasal devices if needed. - Continue to  work on diet/exercise/weight loss. Commended pt on giving up alcohol and soda.         Relevant Orders   Ambulatory referral to Pulmonology   Hypertension   Overtreated.  Intermittent hypotension with dizziness.  - Reduce lisinopril  from 10mg  to 5 mg. - Monitor blood pressure and symptoms. - Discontinue medication if symptoms persist with low readings.       Relevant Medications   lisinopril  (ZESTRIL ) 5 MG tablet   Other Visit Diagnoses       Essential (primary) hypertension    -  Primary   Relevant Medications   lisinopril  (ZESTRIL ) 5 MG tablet       I have discontinued Julie Powell's lisinopril  and lisinopril . I am also having her start on lisinopril . Additionally, I am having her maintain her multivitamin, cyanocobalamin , hyoscyamine, Vitamin D3, albuterol , PARoxetine , lithium  carbonate, and buPROPion .  Meds ordered this encounter  Medications   DISCONTD: lisinopril  (ZESTRIL ) 5 MG tablet    Sig: Take 1 tablet (5 mg total) by mouth daily.    Dispense:  30 tablet    Refill:  3    Supervising Provider:   Randie Bustle A [[id number]]   lisinopril  (ZESTRIL ) 5 MG tablet    Sig: Take 1 tablet (5 mg total) by mouth daily.    Dispense:  90 tablet  Refill:  0    Pt requesting 90 instead of 30, please disregard rx for 30 tabs    Supervising Provider:   Randie Bustle A [[id number]]

## 2024-05-27 ENCOUNTER — Encounter (HOSPITAL_BASED_OUTPATIENT_CLINIC_OR_DEPARTMENT_OTHER): Payer: Self-pay

## 2024-07-03 ENCOUNTER — Ambulatory Visit (HOSPITAL_BASED_OUTPATIENT_CLINIC_OR_DEPARTMENT_OTHER)
Admission: RE | Admit: 2024-07-03 | Discharge: 2024-07-03 | Disposition: A | Discharge status: Home / Self Care | Source: Ambulatory Visit | Attending: Family | Admitting: Family

## 2024-07-03 DIAGNOSIS — Z780 Asymptomatic menopausal state: Secondary | ICD-10-CM | POA: Diagnosis not present

## 2024-07-05 ENCOUNTER — Ambulatory Visit: Payer: Self-pay | Admitting: Family

## 2024-07-09 ENCOUNTER — Ambulatory Visit: Admitting: Family

## 2024-07-09 VITALS — BP 111/60 | HR 64 | Temp 97.9°F | Resp 16 | Ht 64.0 in | Wt 261.0 lb

## 2024-07-09 DIAGNOSIS — F1011 Alcohol abuse, in remission: Secondary | ICD-10-CM

## 2024-07-09 DIAGNOSIS — E559 Vitamin D deficiency, unspecified: Secondary | ICD-10-CM

## 2024-07-09 DIAGNOSIS — I10 Essential (primary) hypertension: Secondary | ICD-10-CM | POA: Diagnosis not present

## 2024-07-09 DIAGNOSIS — E66813 Obesity, class 3: Secondary | ICD-10-CM

## 2024-07-09 DIAGNOSIS — R739 Hyperglycemia, unspecified: Secondary | ICD-10-CM

## 2024-07-09 DIAGNOSIS — Z23 Encounter for immunization: Secondary | ICD-10-CM

## 2024-07-09 DIAGNOSIS — E785 Hyperlipidemia, unspecified: Secondary | ICD-10-CM | POA: Diagnosis not present

## 2024-07-09 DIAGNOSIS — E538 Deficiency of other specified B group vitamins: Secondary | ICD-10-CM

## 2024-07-09 DIAGNOSIS — J45909 Unspecified asthma, uncomplicated: Secondary | ICD-10-CM | POA: Diagnosis not present

## 2024-07-09 DIAGNOSIS — Z6841 Body Mass Index (BMI) 40.0 and over, adult: Secondary | ICD-10-CM

## 2024-07-09 DIAGNOSIS — F332 Major depressive disorder, recurrent severe without psychotic features: Secondary | ICD-10-CM

## 2024-07-09 MED ORDER — LISINOPRIL 5 MG PO TABS: 5.0000 mg | ORAL_TABLET | Freq: Every day | ORAL | 1 refills | Status: AC

## 2024-07-09 NOTE — Progress Notes (Signed)
 Subjective:     Patient ID: Gaylyn Solian, female    DOB: 03-28-1961, 63 y.o.   MRN: [id number]  Chief Complaint  Patient presents with   Hypertension    Here for follow up    Hypertension    Discussed the use of AI scribe software for clinical note transcription with the patient, who gave verbal consent to proceed.  History of Present Illness  Cherelle Midkiff is a 63 year old female who presents for a follow-up visit.  She manages hypertension with lisinopril , recently reducing the dose from 10 mg to 5 mg due to dizziness, which has resolved. She has not taken her dose today. Her borderline diabetes is managed through dietary changes, resulting in a 14-pound weight loss, though she has not yet incorporated regular exercise. She continues psychiatric care with Wellbutrin , lithium , and Paxil . Abstaining from alcohol has positively impacted her mood and weight. Her asthma is well-controlled, and she rarely uses her albuterol  inhaler. She takes hyoscyamine for cramping, currently at half a tablet daily, and plans to reduce it further. A recent bone density test showed a slight decrease but remains normal.  BP Readings from Last 3 Encounters:  07/09/24 111/60  04/25/24 109/75  01/08/24 113/65      Health Maintenance Due  Topic Date Due   INFLUENZA VACCINE  06/06/2024   COVID-19 Vaccine (5 - 2025-26 season) 07/07/2024    Past Medical History:  Diagnosis Date   Anxiety    Asthma    B12 deficiency    Bipolar 1 disorder (HCC) 2010   ect   Fibroids    Hyperglycemia    Hypertension    Plantar fasciitis 2013   Sciatica    RT hip   Vitamin D  deficiency     Past Surgical History:  Procedure Laterality Date   CESAREAN SECTION  1995   CYSTECTOMY  1998   thyroglycol duct cyst in neck had dysphagia, was told benign   GASTRIC BYPASS  2003   MICRODISCECTOMY LUMBAR  09/11/2014   L3-L4  Dr Onetha   PILONIDAL CYST DRAINAGE  1982   2024   REDUCTION MAMMAPLASTY     SP  CHOLECYSTOMY  1986    Family History  Adopted: Yes  Problem Relation Age of Onset   Aneurysm Father    Alcohol abuse Father    Lung cancer Sister    Alcohol abuse Brother    Alcohol abuse Brother    Cancer Brother        prostate   Alcohol abuse Brother    Thyroid  disease Neg Hx    Diabetes Neg Hx    Hypertension Neg Hx     Social History   Socioeconomic History   Marital status: Divorced    Spouse name: Not on file   Number of children: 1   Years of education: Not on file   Highest education level: Bachelor's degree (e.g., BA, AB, BS)  Occupational History   Not on file  Tobacco Use   Smoking status: Former    Current packs/day: 0.00    Average packs/day: 0.5 packs/day for 10.0 years (5.0 ttl pk-yrs)    Types: Cigarettes    Start date: 11/06/1981    Quit date: 11/07/1991    Years since quitting: 32.6   Smokeless tobacco: Never  Vaping Use   Vaping status: Never Used  Substance and Sexual Activity   Alcohol use: Not Currently   Drug use: Not Currently    Types: Crack cocaine  Comment: Caffeine- Carbonated beverage 16 ounces.   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   Enjoys television   Right handed    Apartment 1st floor    Social Drivers of Health   Financial Resource Strain: Low Risk  (04/24/2024)   Overall Financial Resource Strain (CARDIA)    Difficulty of Paying Living Expenses: Not hard at all  Food Insecurity: No Food Insecurity (04/24/2024)   Hunger Vital Sign    Worried About Running Out of Food in the Last Year: Never true    Ran Out of Food in the Last Year: Never true  Transportation Needs: No  Transportation Needs (04/24/2024)   PRAPARE - Administrator, Civil Service (Medical): No    Lack of Transportation (Non-Medical): No  Physical Activity: Inactive (04/24/2024)   Exercise Vital Sign    Days of Exercise per Week: 0 days    Minutes of Exercise per Session: 0 min  Stress: No Stress Concern Present (04/24/2024)   [employer] of Occupational Health - Occupational Stress Questionnaire    Feeling of Stress: Not at all  Social Connections: Unknown (04/24/2024)   Social Connection and Isolation Panel    Frequency of Communication with Friends and Family: Twice a week    Frequency of Social Gatherings with Friends and Family: Not on file    Attends Religious Services: Never    Database administrator or Organizations: No    Attends Banker Meetings: Never    Marital Status: Divorced  Catering manager Violence: Not At Risk (04/24/2024)   Humiliation, Afraid, Rape, and Kick questionnaire    Fear of Current or Ex-Partner: No    Emotionally Abused: No    Physically Abused: No    Sexually Abused: No    Outpatient Medications Prior to Visit  Medication Sig Dispense Refill   albuterol  (PROAIR  HFA) 108 (90 Base) MCG/ACT inhaler Inhale 2 puffs into the lungs every 6 (six) hours as needed for wheezing or shortness of breath. 20.1 g 1   buPROPion  (WELLBUTRIN ) 100 MG tablet Take 2 tablets (200 mg total) by mouth 2 (two) times daily. TAKE 2 BID 360 tablet 0   Cholecalciferol (VITAMIN D3) 50 MCG (2000 UT) capsule 1-2 tabs by mouth once daily     cyanocobalamin  (VITAMIN B12) 1000 MCG tablet Take 1 tablet (1,000 mcg total) by mouth daily.     hyoscyamine (LEVBID) 0.375 MG 12 hr tablet Take 1 tablet by mouth daily.     lithium  carbonate (LITHOBID ) 300 MG ER tablet BID 180 tablet 0   Multiple Vitamin (MULTIVITAMIN) tablet Take 1 tablet by mouth 3 (three) times a week.     PARoxetine  (PAXIL ) 30 MG tablet ONE A DAY 90 tablet 0   lisinopril  (ZESTRIL ) 5 MG tablet Take 1  tablet (5 mg total) by mouth daily. 90 tablet 0   No facility-administered medications prior to visit.    No Known Allergies  ROS See HPI    Objective:    Physical Exam Constitutional:      General: She is not in acute distress.    Appearance:  Normal appearance. She is well-developed.  HENT:     Head: Normocephalic and atraumatic.     Right Ear: External ear normal.     Left Ear: External ear normal.  Eyes:     General: No scleral icterus. Neck:     Thyroid : No thyromegaly.  Cardiovascular:     Rate and Rhythm: Normal rate and regular rhythm.     Heart sounds: Normal heart sounds. No murmur heard. Pulmonary:     Effort: Pulmonary effort is normal. No respiratory distress.     Breath sounds: Normal breath sounds. No wheezing.  Musculoskeletal:     Cervical back: Neck supple.  Skin:    General: Skin is warm and dry.  Neurological:     Mental Status: She is alert and oriented to person, place, and time.  Psychiatric:        Mood and Affect: Mood normal.        Behavior: Behavior normal.        Thought Content: Thought content normal.        Judgment: Judgment normal.      BP 111/60 (BP Location: Right Arm, Patient Position: Sitting, Cuff Size: Normal)   Pulse 64   Temp 97.9 F (36.6 C) (Oral)   Resp 16   Ht 5' 4 (1.626 m)   Wt 261 lb (118.4 kg)   LMP 09/11/2013   SpO2 100%   BMI 44.80 kg/m  Wt Readings from Last 3 Encounters:  07/09/24 261 lb (118.4 kg)  04/25/24 275 lb (124.7 kg)  04/24/24 280 lb (127 kg)       Assessment & Plan:   Problem List Items Addressed This Visit       Unprioritized   Vitamin D  deficiency   Continues vit D supplement 2000 international units daily. Update level.      Relevant Orders   Vitamin D  (25 hydroxy)   Vitamin B 12 deficiency   Continues b12 1000 mcg tab daily update level today.       Relevant Orders   B12   Major depressive disorder, recurrent, severe w/o psychotic behavior (HCC)   Reports stable mood.  Followed by Dr. Jalaine in [place], psychiatry.  Currently on lithium , wellbutrin  and paxil .       Hypertension   No longer dizzy in lisinopril  5mg . Continue reduce dose.       Relevant Medications   lisinopril  (ZESTRIL ) 5 MG tablet   Hyperlipidemia   Lab Results  Component Value Date   CHOL 198 01/02/2023   HDL 92.50 01/02/2023   LDLCALC 93 01/02/2023   TRIG 62.0 01/02/2023   CHOLHDL 2 01/02/2023   Not on statin, last lipid panel was [facility].        Relevant Medications   lisinopril  (ZESTRIL ) 5 MG tablet   Hyperglycemia   Update A1C, working hard on diet/exercise and weight loss.       Relevant Orders   HgB A1c   Basic Metabolic Panel (BMET)   History of alcohol abuse   Remains abstinent.       Class 3 severe obesity due to excess calories with serious comorbidity and body mass index (BMI) of 40.0 to 44.9 in adult   She has lost 14 pounds with improved diet. Encouraged her to continue the good work.       Asthma   No recent asthma symptoms. Has albuterol  on hand for prn use.       Other Visit Diagnoses  Needs flu shot    -  Primary   Relevant Orders   Flu vaccine trivalent PF, 6mos and older(Flulaval,Afluria,Fluarix,Fluzone)     Essential (primary) hypertension       Relevant Medications   lisinopril  (ZESTRIL ) 5 MG tablet       I am having Terika Ander maintain her multivitamin, cyanocobalamin , hyoscyamine, Vitamin D3, albuterol , PARoxetine , lithium  carbonate, buPROPion , and lisinopril .  Meds ordered this encounter  Medications   lisinopril  (ZESTRIL ) 5 MG tablet    Sig: Take 1 tablet (5 mg total) by mouth daily.    Dispense:  90 tablet    Refill:  1    Supervising Provider:   DOMENICA BLACKBIRD A [[id number]]

## 2024-07-09 NOTE — Assessment & Plan Note (Signed)
 Update A1C, working hard on diet/exercise and weight loss.

## 2024-07-09 NOTE — Assessment & Plan Note (Addendum)
 Lab Results  Component Value Date   CHOL 198 01/02/2023   HDL 92.50 01/02/2023   LDLCALC 93 01/02/2023   TRIG 62.0 01/02/2023   CHOLHDL 2 01/02/2023   Not on statin, last lipid panel was [facility]NL.

## 2024-07-09 NOTE — Assessment & Plan Note (Signed)
 Continues b12 1000 mcg tab daily update level today.

## 2024-07-09 NOTE — Assessment & Plan Note (Signed)
 Reports stable mood. Followed by Dr. Jalaine in [place], psychiatry.  Currently on lithium , wellbutrin  and paxil .

## 2024-07-09 NOTE — Assessment & Plan Note (Signed)
 No recent asthma symptoms.  Has albuterol on hand for prn use.

## 2024-07-09 NOTE — Assessment & Plan Note (Signed)
 Continues vit D supplement 2000 international units daily. Update level.

## 2024-07-09 NOTE — Assessment & Plan Note (Signed)
 Remains abstinent

## 2024-07-09 NOTE — Assessment & Plan Note (Signed)
 She has lost 14 pounds with improved diet. Encouraged her to continue the good work.

## 2024-07-09 NOTE — Patient Instructions (Signed)
 VISIT SUMMARY:  Today, we reviewed your hypertension, borderline diabetes, abdominal cramping, bone density, and vitamin levels. We also discussed your current medications and made some adjustments to your treatment plan.  YOUR PLAN:  HYPERTENSION: Your blood pressure is well-controlled with the current dose of lisinopril . -Continue taking lisinopril  5 mg daily. -Your prescription for lisinopril  has been sent to [facility], [place].  MILDLY DECREASED KIDNEY FUNCTION: We are monitoring your kidney function to ensure it remains stable. -We will order kidney function tests to monitor your condition.  BORDERLINE DIABETES (PREDIABETES): Your A1c level indicates borderline diabetes. You have made good progress with weight loss through dietary changes. -We will order an A1c test to monitor your blood sugar levels. -Continue with lifestyle modifications including exercise and dietary changes.  ABDOMINAL CRAMPING: Your abdominal cramping is managed with hyoscyamine, and you are gradually reducing the dose. -Continue taking hyoscyamine as currently prescribed and gradually reduce the dose. -Ensure your prescription refills through your digestive health specialist.  DECREASED BONE DENSITY: Your bone density is slightly decreased but still within normal range. -Ensure you are getting enough calcium  in your diet. -Incorporate regular exercise such as walking into your routine.  VITAMIN D  DEFICIENCY: Your vitamin D  levels were normal a year ago with supplementation, but it's time for a re-evaluation. -We will order a vitamin D  level test. -Continue taking vitamin D  supplements at 2000 units daily.  VITAMIN B12 DEFICIENCY: Your vitamin B12 levels were low normal a year ago, and it's time for a re-evaluation. -We will order a vitamin B12 level test. -Continue taking vitamin B12 supplements at 1000 micrograms daily.  GENERAL HEALTH MAINTENANCE: Your preventive care is up to date except for the  flu shot. -We will administer the flu shot before you leave today.

## 2024-07-09 NOTE — Assessment & Plan Note (Signed)
 No longer dizzy in lisinopril  5mg . Continue reduce dose.

## 2024-07-10 ENCOUNTER — Ambulatory Visit: Payer: Self-pay | Admitting: Family

## 2024-07-10 LAB — BASIC METABOLIC PANEL WITH GFR
BUN: 15 mg/dL (ref 6–23)
CO2: 26 meq/L (ref 19–32)
Calcium: 9.3 mg/dL (ref 8.4–10.5)
Chloride: 105 meq/L (ref 96–112)
Creatinine, Ser: 0.93 mg/dL (ref 0.40–1.20)
GFR: 66.63 mL/min (ref >60.00)
Glucose, Bld: 89 mg/dL (ref 70–99)
Potassium: 4.4 meq/L (ref 3.5–5.1)
Sodium: 140 meq/L (ref 135–145)

## 2024-07-10 LAB — VITAMIN B12: Vitamin B-12: 415 pg/mL (ref 211–911)

## 2024-07-10 LAB — HEMOGLOBIN A1C: Hgb A1c MFr Bld: 6.1 % (ref 4.6–6.5)

## 2024-07-10 LAB — VITAMIN D 25 HYDROXY (VIT D DEFICIENCY, FRACTURES): VITD: 37.68 ng/mL (ref 30.00–100.00)

## 2024-08-12 ENCOUNTER — Ambulatory Visit (HOSPITAL_COMMUNITY): Admitting: Psychiatry

## 2024-08-12 ENCOUNTER — Encounter (HOSPITAL_COMMUNITY): Payer: Self-pay | Admitting: Psychiatry

## 2024-08-12 VITALS — BP 103/83 | HR 71 | Ht 64.0 in | Wt 254.0 lb

## 2024-08-12 DIAGNOSIS — F3130 Bipolar disorder, current episode depressed, mild or moderate severity, unspecified: Secondary | ICD-10-CM

## 2024-08-12 DIAGNOSIS — F3289 Other specified depressive episodes: Secondary | ICD-10-CM | POA: Diagnosis not present

## 2024-08-12 DIAGNOSIS — F4010 Social phobia, unspecified: Secondary | ICD-10-CM

## 2024-08-12 DIAGNOSIS — F419 Anxiety disorder, unspecified: Secondary | ICD-10-CM

## 2024-08-12 DIAGNOSIS — F1020 Alcohol dependence, uncomplicated: Secondary | ICD-10-CM

## 2024-08-12 DIAGNOSIS — F1090 Alcohol use, unspecified, uncomplicated: Secondary | ICD-10-CM | POA: Diagnosis not present

## 2024-08-12 DIAGNOSIS — F332 Major depressive disorder, recurrent severe without psychotic features: Secondary | ICD-10-CM

## 2024-08-12 MED ORDER — BUPROPION HCL 100 MG PO TABS: 200.0000 mg | ORAL_TABLET | Freq: Two times a day (BID) | ORAL | 0 refills | Status: DC

## 2024-08-12 MED ORDER — LITHIUM CARBONATE ER 300 MG PO TBCR: EXTENDED_RELEASE_TABLET | ORAL | 0 refills | Status: AC

## 2024-08-12 MED ORDER — PAROXETINE HCL 30 MG PO TABS
ORAL_TABLET | ORAL | 0 refills | Status: AC
Start: 2024-08-12 — End: ?

## 2024-08-12 NOTE — Progress Notes (Signed)
 Patient ID: Julie Powell, female   DOB: [birth year], 63 y.o.   MRN: [id number] [facility] Follow-up Outpatient Visit  Julie Powell 08/25/1961         CC  depression follow up  HPI Comments: Julie Powell is a 63   Y/O woman with a past psychiatric history Major Depressive Disorder, severe, recurrent. Mood disorder due to [facility], Alcohol use disorder and Social Phobia.   On evaluation patient is doing better she has lost weight in the last 3 months of nearly 15 to 20 pounds she is trying to take care of her physical health and spends time with her grand daughter who is 62 years old.  Remains sober does not endorse having any craving  Lithium  level was normal 0.6 around February 2025 she is monitoring her hemoglobin A1c and working on weight maintenance  hBA1C 6 and is being evaluated by PCP    Aggravating factor: Loneliness can be modifying factor:housing, grand baby   Duration: since young age  Timing: variable   Context: alcohol use history\ Severity; manageable   Associated signs and symptoms: Denies psychosis   Traumatic Brain Injury: No none   Review of Systems  Cardiovascular:  Negative for chest pain and palpitations.  Neurological:  Negative for headaches.  Psychiatric/Behavioral:  Negative for depression and suicidal ideas.    Vitals:   08/12/24 1333  BP: 103/83  Pulse: 71  SpO2: 99%  Weight: 254 lb (115.2 kg)  Height: 5' 4 (1.626 m)     Physical Exam  Constitutional: She appears well-developed and well-nourished. No distress.  Skin: She is not diaphoretic.  No tremors of hands noted while patient was interviewed.     Past Medical History: Reviewed  Past Medical History:  Diagnosis Date   Anxiety    Asthma    B12 deficiency    Bipolar 1 disorder (HCC) 2010   ect   Fibroids    Hyperglycemia    Hypertension    Plantar fasciitis 2013   Sciatica    RT hip   Vitamin D  deficiency     Allergies: No Known Allergies   Current  Medications: Reviewed   Current Outpatient Medications on File Prior to Visit  Medication Sig Dispense Refill   albuterol  (PROAIR  HFA) 108 (90 Base) MCG/ACT inhaler Inhale 2 puffs into the lungs every 6 (six) hours as needed for wheezing or shortness of breath. 20.1 g 1   Cholecalciferol (VITAMIN D3) 50 MCG (2000 UT) capsule 1-2 tabs by mouth once daily     cyanocobalamin  (VITAMIN B12) 1000 MCG tablet Take 1 tablet (1,000 mcg total) by mouth daily.     hyoscyamine (LEVBID) 0.375 MG 12 hr tablet Take 1 tablet by mouth daily.     lisinopril  (ZESTRIL ) 5 MG tablet Take 1 tablet (5 mg total) by mouth daily. 90 tablet 1   Multiple Vitamin (MULTIVITAMIN) tablet Take 1 tablet by mouth 3 (three) times a week.     No current facility-administered medications on file prior to visit.      Family History: Reviewed  Family History   Problem  Relation  Age of Onset     Adopted: Yes     Aneurysm  Father     Psychiatric Specialty Exam:  Objective: Appearance: casual  Eye Contact:: casual  Speech: Clear and Coherent and Normal Rate   Volume: Normal   Mood fair   Affect:congruent  Thought Process: Goal Directed, Logical and Loose   Orientation: Full   Thought Content:  WDL   Suicidal Thoughts: Patient denies   Homicidal Thoughts: Patient denies   Judgement: Fair   Insight: Fair   Psychomotor Activity: Normal   Akathisia: No   Handed: Right   Memory: Immediate 3/3, Recent: 1/3   Language-intact  Fund of knowledge-average to above average.  AIMS (if indicated): Not indicated at this time.   Assets: Communication Skills  Desire for Improvement      Plan of Care:        [name]   Medications and Assessment :  Prior documentation reviewed  Bipolar depression:depressed; manageable continue lithium , Wellbutrin , Paxil    Anxiety: Manageable continue Paxil   alcohol use: Remains sober continue relapse prevention and discussed     Fu 3 months or earlier     time spent in office and  face to face 18 minutes including chart review         JACKEY FLIGHT, M.D.  08/12/2024 1:50 PM

## 2024-08-28 DIAGNOSIS — R1013 Epigastric pain: Secondary | ICD-10-CM | POA: Diagnosis not present

## 2024-11-03 ENCOUNTER — Other Ambulatory Visit: Payer: Self-pay | Admitting: Family

## 2024-11-03 ENCOUNTER — Other Ambulatory Visit (HOSPITAL_COMMUNITY): Payer: Self-pay | Admitting: Psychiatry

## 2024-12-09 ENCOUNTER — Ambulatory Visit (HOSPITAL_COMMUNITY): Admitting: Psychiatry

## 2024-12-23 ENCOUNTER — Ambulatory Visit (HOSPITAL_COMMUNITY): Admitting: Psychiatry

## 2025-01-07 ENCOUNTER — Ambulatory Visit: Admitting: Family

## 2025-04-28 ENCOUNTER — Ambulatory Visit
